# Patient Record
Sex: Male | Born: 1940 | Race: White | Hispanic: No | Marital: Married | Smoking: Never smoker
Health system: Southern US, Community
[De-identification: ages and names within clinical notes are randomized; demographics above are authoritative.]

## PROBLEM LIST (undated history)

## (undated) DIAGNOSIS — I10 Essential (primary) hypertension: Secondary | ICD-10-CM

## (undated) DIAGNOSIS — I499 Cardiac arrhythmia, unspecified: Secondary | ICD-10-CM

## (undated) DIAGNOSIS — Z974 Presence of external hearing-aid: Secondary | ICD-10-CM

## (undated) DIAGNOSIS — Z972 Presence of dental prosthetic device (complete) (partial): Secondary | ICD-10-CM

## (undated) DIAGNOSIS — N400 Enlarged prostate without lower urinary tract symptoms: Secondary | ICD-10-CM

## (undated) DIAGNOSIS — I4891 Unspecified atrial fibrillation: Secondary | ICD-10-CM

## (undated) DIAGNOSIS — I809 Phlebitis and thrombophlebitis of unspecified site: Secondary | ICD-10-CM

## (undated) DIAGNOSIS — R351 Nocturia: Secondary | ICD-10-CM

## (undated) DIAGNOSIS — N189 Chronic kidney disease, unspecified: Secondary | ICD-10-CM

## (undated) DIAGNOSIS — I252 Old myocardial infarction: Secondary | ICD-10-CM

## (undated) DIAGNOSIS — M109 Gout, unspecified: Secondary | ICD-10-CM

## (undated) DIAGNOSIS — C801 Malignant (primary) neoplasm, unspecified: Secondary | ICD-10-CM

## (undated) DIAGNOSIS — E785 Hyperlipidemia, unspecified: Secondary | ICD-10-CM

## (undated) DIAGNOSIS — I2510 Atherosclerotic heart disease of native coronary artery without angina pectoris: Secondary | ICD-10-CM

## (undated) DIAGNOSIS — M1990 Unspecified osteoarthritis, unspecified site: Secondary | ICD-10-CM

## (undated) HISTORY — PX: HERNIA REPAIR: SHX51

## (undated) HISTORY — DX: Benign prostatic hyperplasia without lower urinary tract symptoms: N40.0

## (undated) HISTORY — PX: CARDIAC CATHETERIZATION: SHX172

## (undated) HISTORY — PX: OTHER SURGICAL HISTORY: SHX169

## (undated) HISTORY — DX: Nocturia: R35.1

---

## 1990-12-26 HISTORY — PX: CORONARY ARTERY BYPASS GRAFT: SHX141

## 2004-05-22 ENCOUNTER — Other Ambulatory Visit: Payer: Self-pay

## 2004-11-14 ENCOUNTER — Emergency Department: Payer: Self-pay | Admitting: Emergency Medicine

## 2004-12-19 ENCOUNTER — Other Ambulatory Visit: Payer: Self-pay

## 2004-12-20 ENCOUNTER — Other Ambulatory Visit: Payer: Self-pay

## 2004-12-20 ENCOUNTER — Inpatient Hospital Stay: Payer: Self-pay | Admitting: Internal Medicine

## 2004-12-22 ENCOUNTER — Other Ambulatory Visit: Payer: Self-pay

## 2005-01-11 ENCOUNTER — Encounter: Payer: Self-pay | Admitting: Internal Medicine

## 2005-01-26 ENCOUNTER — Encounter: Payer: Self-pay | Admitting: Internal Medicine

## 2005-03-16 ENCOUNTER — Ambulatory Visit: Payer: Self-pay | Admitting: Otolaryngology

## 2007-04-26 ENCOUNTER — Ambulatory Visit: Payer: Self-pay | Admitting: Radiation Oncology

## 2007-05-01 ENCOUNTER — Ambulatory Visit: Payer: Self-pay | Admitting: Radiation Oncology

## 2007-05-27 ENCOUNTER — Ambulatory Visit: Payer: Self-pay | Admitting: Radiation Oncology

## 2007-06-26 ENCOUNTER — Ambulatory Visit: Payer: Self-pay | Admitting: Radiation Oncology

## 2007-08-06 ENCOUNTER — Ambulatory Visit: Payer: Self-pay | Admitting: Gastroenterology

## 2007-11-21 ENCOUNTER — Emergency Department: Payer: Self-pay | Admitting: Emergency Medicine

## 2008-01-01 DIAGNOSIS — C4492 Squamous cell carcinoma of skin, unspecified: Secondary | ICD-10-CM

## 2008-01-01 HISTORY — DX: Squamous cell carcinoma of skin, unspecified: C44.92

## 2008-01-10 DIAGNOSIS — Z86006 Personal history of melanoma in-situ: Secondary | ICD-10-CM

## 2008-01-10 HISTORY — DX: Personal history of melanoma in-situ: Z86.006

## 2008-02-21 ENCOUNTER — Other Ambulatory Visit: Payer: Self-pay

## 2008-02-21 ENCOUNTER — Inpatient Hospital Stay: Payer: Self-pay | Admitting: Internal Medicine

## 2008-08-18 ENCOUNTER — Ambulatory Visit: Payer: Self-pay | Admitting: Gastroenterology

## 2009-03-14 ENCOUNTER — Emergency Department: Payer: Self-pay | Admitting: Unknown Physician Specialty

## 2009-03-14 ENCOUNTER — Emergency Department: Payer: Self-pay | Admitting: Emergency Medicine

## 2010-05-07 ENCOUNTER — Emergency Department: Payer: Self-pay | Admitting: Internal Medicine

## 2010-10-06 ENCOUNTER — Inpatient Hospital Stay: Payer: Self-pay | Admitting: Internal Medicine

## 2011-03-24 ENCOUNTER — Inpatient Hospital Stay: Payer: Self-pay | Admitting: Internal Medicine

## 2011-06-17 ENCOUNTER — Ambulatory Visit: Payer: Self-pay | Admitting: Specialist

## 2011-06-24 ENCOUNTER — Ambulatory Visit: Payer: Self-pay | Admitting: Specialist

## 2011-10-05 ENCOUNTER — Encounter: Payer: Self-pay | Admitting: Specialist

## 2011-10-27 ENCOUNTER — Encounter: Payer: Self-pay | Admitting: Specialist

## 2011-11-26 ENCOUNTER — Encounter: Payer: Self-pay | Admitting: Specialist

## 2012-02-03 ENCOUNTER — Ambulatory Visit: Payer: Self-pay | Admitting: Gastroenterology

## 2012-02-03 LAB — PROTIME-INR: Prothrombin Time: 14.5 secs (ref 11.5–14.7)

## 2012-02-07 LAB — PATHOLOGY REPORT

## 2012-06-01 ENCOUNTER — Ambulatory Visit: Payer: Self-pay | Admitting: Gastroenterology

## 2012-06-01 LAB — PROTIME-INR
INR: 1.1
Prothrombin Time: 14.1 secs (ref 11.5–14.7)

## 2012-06-05 LAB — PATHOLOGY REPORT

## 2013-07-04 ENCOUNTER — Ambulatory Visit: Payer: Self-pay | Admitting: Specialist

## 2013-07-04 LAB — CBC WITH DIFFERENTIAL/PLATELET
Basophil %: 0.5 %
HCT: 37.2 % — ABNORMAL LOW (ref 40.0–52.0)
HGB: 13.3 g/dL (ref 13.0–18.0)
Lymphocyte #: 0.6 10*3/uL — ABNORMAL LOW (ref 1.0–3.6)
Lymphocyte %: 15 %
MCH: 33.4 pg (ref 26.0–34.0)
Monocyte #: 0.4 x10 3/mm (ref 0.2–1.0)
Monocyte %: 10.9 %
Neutrophil #: 2.9 10*3/uL (ref 1.4–6.5)
Neutrophil %: 71.8 %
RBC: 3.97 10*6/uL — ABNORMAL LOW (ref 4.40–5.90)
RDW: 13.1 % (ref 11.5–14.5)
WBC: 4 10*3/uL (ref 3.8–10.6)

## 2013-07-04 LAB — BASIC METABOLIC PANEL
Anion Gap: 5 — ABNORMAL LOW (ref 7–16)
BUN: 22 mg/dL — ABNORMAL HIGH (ref 7–18)
Calcium, Total: 8.6 mg/dL (ref 8.5–10.1)
Co2: 29 mmol/L (ref 21–32)
Creatinine: 1.95 mg/dL — ABNORMAL HIGH (ref 0.60–1.30)
EGFR (African American): 38 — ABNORMAL LOW
EGFR (Non-African Amer.): 33 — ABNORMAL LOW
Potassium: 3.3 mmol/L — ABNORMAL LOW (ref 3.5–5.1)

## 2013-07-11 ENCOUNTER — Ambulatory Visit: Payer: Self-pay | Admitting: Specialist

## 2013-07-11 LAB — PROTIME-INR
INR: 1.2
Prothrombin Time: 15.1 secs — ABNORMAL HIGH (ref 11.5–14.7)

## 2013-09-23 ENCOUNTER — Ambulatory Visit: Payer: Self-pay | Admitting: Surgery

## 2013-09-23 LAB — BASIC METABOLIC PANEL
Calcium, Total: 9.1 mg/dL (ref 8.5–10.1)
Chloride: 99 mmol/L (ref 98–107)
Creatinine: 1.58 mg/dL — ABNORMAL HIGH (ref 0.60–1.30)
EGFR (African American): 50 — ABNORMAL LOW
EGFR (Non-African Amer.): 43 — ABNORMAL LOW
Osmolality: 271 (ref 275–301)
Potassium: 3.7 mmol/L (ref 3.5–5.1)
Sodium: 133 mmol/L — ABNORMAL LOW (ref 136–145)

## 2013-09-26 ENCOUNTER — Ambulatory Visit: Payer: Self-pay | Admitting: Surgery

## 2013-10-01 ENCOUNTER — Ambulatory Visit: Payer: Self-pay | Admitting: Surgery

## 2013-10-01 ENCOUNTER — Emergency Department: Payer: Self-pay | Admitting: Emergency Medicine

## 2013-10-01 LAB — PROTIME-INR
INR: 1.3
Prothrombin Time: 15.8 secs — ABNORMAL HIGH (ref 11.5–14.7)

## 2013-10-02 LAB — BASIC METABOLIC PANEL
Anion Gap: 7 (ref 7–16)
BUN: 16 mg/dL (ref 7–18)
Calcium, Total: 8.8 mg/dL (ref 8.5–10.1)
Chloride: 99 mmol/L (ref 98–107)
EGFR (African American): 53 — ABNORMAL LOW
Glucose: 119 mg/dL — ABNORMAL HIGH (ref 65–99)
Potassium: 3.9 mmol/L (ref 3.5–5.1)
Sodium: 134 mmol/L — ABNORMAL LOW (ref 136–145)

## 2013-10-02 LAB — PATHOLOGY REPORT

## 2013-10-02 LAB — URINALYSIS, COMPLETE
Glucose,UR: 50 mg/dL (ref 0–75)
Leukocyte Esterase: NEGATIVE
Nitrite: NEGATIVE
RBC,UR: 4 /HPF (ref 0–5)
Specific Gravity: 1.023 (ref 1.003–1.030)
WBC UR: 2 /HPF (ref 0–5)

## 2013-10-02 LAB — CBC
HCT: 38.2 % — ABNORMAL LOW (ref 40.0–52.0)
MCH: 34.1 pg — ABNORMAL HIGH (ref 26.0–34.0)
MCV: 96 fL (ref 80–100)
Platelet: 186 10*3/uL (ref 150–440)
RBC: 3.99 10*6/uL — ABNORMAL LOW (ref 4.40–5.90)
RDW: 13.3 % (ref 11.5–14.5)

## 2013-10-02 LAB — APTT: Activated PTT: 24.9 secs (ref 23.6–35.9)

## 2013-10-02 LAB — PROTIME-INR
INR: 1.3
Prothrombin Time: 16.3 secs — ABNORMAL HIGH (ref 11.5–14.7)

## 2013-10-05 ENCOUNTER — Emergency Department: Payer: Self-pay | Admitting: Emergency Medicine

## 2013-10-05 LAB — URINALYSIS, COMPLETE
Bilirubin,UR: NEGATIVE
Glucose,UR: NEGATIVE mg/dL (ref 0–75)
Ketone: NEGATIVE
Ph: 6 (ref 4.5–8.0)
Protein: 100
Specific Gravity: 1.024 (ref 1.003–1.030)
WBC UR: 179 /HPF (ref 0–5)

## 2013-10-05 LAB — CBC
HCT: 36.6 % — ABNORMAL LOW (ref 40.0–52.0)
HGB: 12.9 g/dL — ABNORMAL LOW (ref 13.0–18.0)
MCHC: 35.3 g/dL (ref 32.0–36.0)
MCV: 96 fL (ref 80–100)
Platelet: 201 10*3/uL (ref 150–440)
RBC: 3.82 10*6/uL — ABNORMAL LOW (ref 4.40–5.90)
WBC: 10.7 10*3/uL — ABNORMAL HIGH (ref 3.8–10.6)

## 2013-10-05 LAB — COMPREHENSIVE METABOLIC PANEL
Albumin: 3.5 g/dL (ref 3.4–5.0)
Alkaline Phosphatase: 76 U/L (ref 50–136)
Anion Gap: 5 — ABNORMAL LOW (ref 7–16)
BUN: 17 mg/dL (ref 7–18)
Bilirubin,Total: 1 mg/dL (ref 0.2–1.0)
Calcium, Total: 9.1 mg/dL (ref 8.5–10.1)
Chloride: 93 mmol/L — ABNORMAL LOW (ref 98–107)
Co2: 31 mmol/L (ref 21–32)
Glucose: 115 mg/dL — ABNORMAL HIGH (ref 65–99)
SGOT(AST): 68 U/L — ABNORMAL HIGH (ref 15–37)
SGPT (ALT): 72 U/L (ref 12–78)
Sodium: 129 mmol/L — ABNORMAL LOW (ref 136–145)

## 2013-10-09 LAB — URINE CULTURE

## 2013-11-05 DIAGNOSIS — N138 Other obstructive and reflux uropathy: Secondary | ICD-10-CM | POA: Insufficient documentation

## 2014-04-11 ENCOUNTER — Observation Stay: Payer: Self-pay | Admitting: Family Medicine

## 2014-04-11 LAB — COMPREHENSIVE METABOLIC PANEL
ALK PHOS: 54 U/L
ANION GAP: 5 — AB (ref 7–16)
AST: 31 U/L (ref 15–37)
Albumin: 3.9 g/dL (ref 3.4–5.0)
BILIRUBIN TOTAL: 0.6 mg/dL (ref 0.2–1.0)
BUN: 25 mg/dL — ABNORMAL HIGH (ref 7–18)
CALCIUM: 8.5 mg/dL (ref 8.5–10.1)
CHLORIDE: 96 mmol/L — AB (ref 98–107)
CREATININE: 1.93 mg/dL — AB (ref 0.60–1.30)
Co2: 29 mmol/L (ref 21–32)
GFR CALC AF AMER: 39 — AB
GFR CALC NON AF AMER: 33 — AB
Glucose: 128 mg/dL — ABNORMAL HIGH (ref 65–99)
OSMOLALITY: 267 (ref 275–301)
POTASSIUM: 3.6 mmol/L (ref 3.5–5.1)
SGPT (ALT): 48 U/L (ref 12–78)
Sodium: 130 mmol/L — ABNORMAL LOW (ref 136–145)
Total Protein: 7.1 g/dL (ref 6.4–8.2)

## 2014-04-11 LAB — APTT: Activated PTT: 28.4 secs (ref 23.6–35.9)

## 2014-04-11 LAB — CBC
HCT: 38.3 % — ABNORMAL LOW (ref 40.0–52.0)
HGB: 13.1 g/dL (ref 13.0–18.0)
MCH: 31.7 pg (ref 26.0–34.0)
MCHC: 34.2 g/dL (ref 32.0–36.0)
MCV: 93 fL (ref 80–100)
Platelet: 195 10*3/uL (ref 150–440)
RBC: 4.13 10*6/uL — ABNORMAL LOW (ref 4.40–5.90)
RDW: 13.1 % (ref 11.5–14.5)
WBC: 3.6 10*3/uL — AB (ref 3.8–10.6)

## 2014-04-11 LAB — PROTIME-INR
INR: 2.3
PROTHROMBIN TIME: 25.1 s — AB (ref 11.5–14.7)

## 2014-04-11 LAB — TROPONIN I
Troponin-I: <0.02 ng/mL
Troponin-I: <0.02 ng/mL

## 2014-04-11 LAB — CK TOTAL AND CKMB (NOT AT ARMC)
CK, TOTAL: 168 U/L
CK-MB: 2.2 ng/mL (ref 0.5–3.6)

## 2014-04-12 LAB — BASIC METABOLIC PANEL
ANION GAP: 6 — AB (ref 7–16)
BUN: 23 mg/dL — AB (ref 7–18)
CHLORIDE: 97 mmol/L — AB (ref 98–107)
CREATININE: 1.68 mg/dL — AB (ref 0.60–1.30)
Calcium, Total: 8.7 mg/dL (ref 8.5–10.1)
Co2: 29 mmol/L (ref 21–32)
EGFR (Non-African Amer.): 39 — ABNORMAL LOW
GFR CALC AF AMER: 46 — AB
GLUCOSE: 92 mg/dL (ref 65–99)
OSMOLALITY: 268 (ref 275–301)
Potassium: 3.9 mmol/L (ref 3.5–5.1)
Sodium: 132 mmol/L — ABNORMAL LOW (ref 136–145)

## 2014-04-12 LAB — TROPONIN I: Troponin-I: <0.02 ng/mL

## 2014-04-28 DIAGNOSIS — Z7901 Long term (current) use of anticoagulants: Secondary | ICD-10-CM | POA: Insufficient documentation

## 2014-04-28 DIAGNOSIS — Z9229 Personal history of other drug therapy: Secondary | ICD-10-CM | POA: Insufficient documentation

## 2014-05-13 DIAGNOSIS — N189 Chronic kidney disease, unspecified: Secondary | ICD-10-CM | POA: Insufficient documentation

## 2014-05-13 DIAGNOSIS — N1831 Chronic kidney disease, stage 3a: Secondary | ICD-10-CM | POA: Insufficient documentation

## 2014-05-13 DIAGNOSIS — I4891 Unspecified atrial fibrillation: Secondary | ICD-10-CM | POA: Insufficient documentation

## 2014-05-13 DIAGNOSIS — I2510 Atherosclerotic heart disease of native coronary artery without angina pectoris: Secondary | ICD-10-CM | POA: Insufficient documentation

## 2014-05-13 DIAGNOSIS — I10 Essential (primary) hypertension: Secondary | ICD-10-CM | POA: Insufficient documentation

## 2014-05-13 DIAGNOSIS — R079 Chest pain, unspecified: Secondary | ICD-10-CM | POA: Insufficient documentation

## 2014-05-13 DIAGNOSIS — N183 Chronic kidney disease, stage 3 unspecified: Secondary | ICD-10-CM | POA: Insufficient documentation

## 2014-05-13 DIAGNOSIS — E782 Mixed hyperlipidemia: Secondary | ICD-10-CM | POA: Insufficient documentation

## 2014-05-13 DIAGNOSIS — E785 Hyperlipidemia, unspecified: Secondary | ICD-10-CM | POA: Insufficient documentation

## 2014-05-13 DIAGNOSIS — I25810 Atherosclerosis of coronary artery bypass graft(s) without angina pectoris: Secondary | ICD-10-CM | POA: Insufficient documentation

## 2014-05-13 DIAGNOSIS — Z9889 Other specified postprocedural states: Secondary | ICD-10-CM | POA: Insufficient documentation

## 2014-10-11 IMAGING — CR DG CHEST 2V
1 series · 3 of 3 positions shown · non-contrast
Comparison: none

REASON FOR EXAM: htn
COMMENTS:

[Series 1: pa · 0.17mm/px · 3 of 3 slices shown]
[im 1/3]
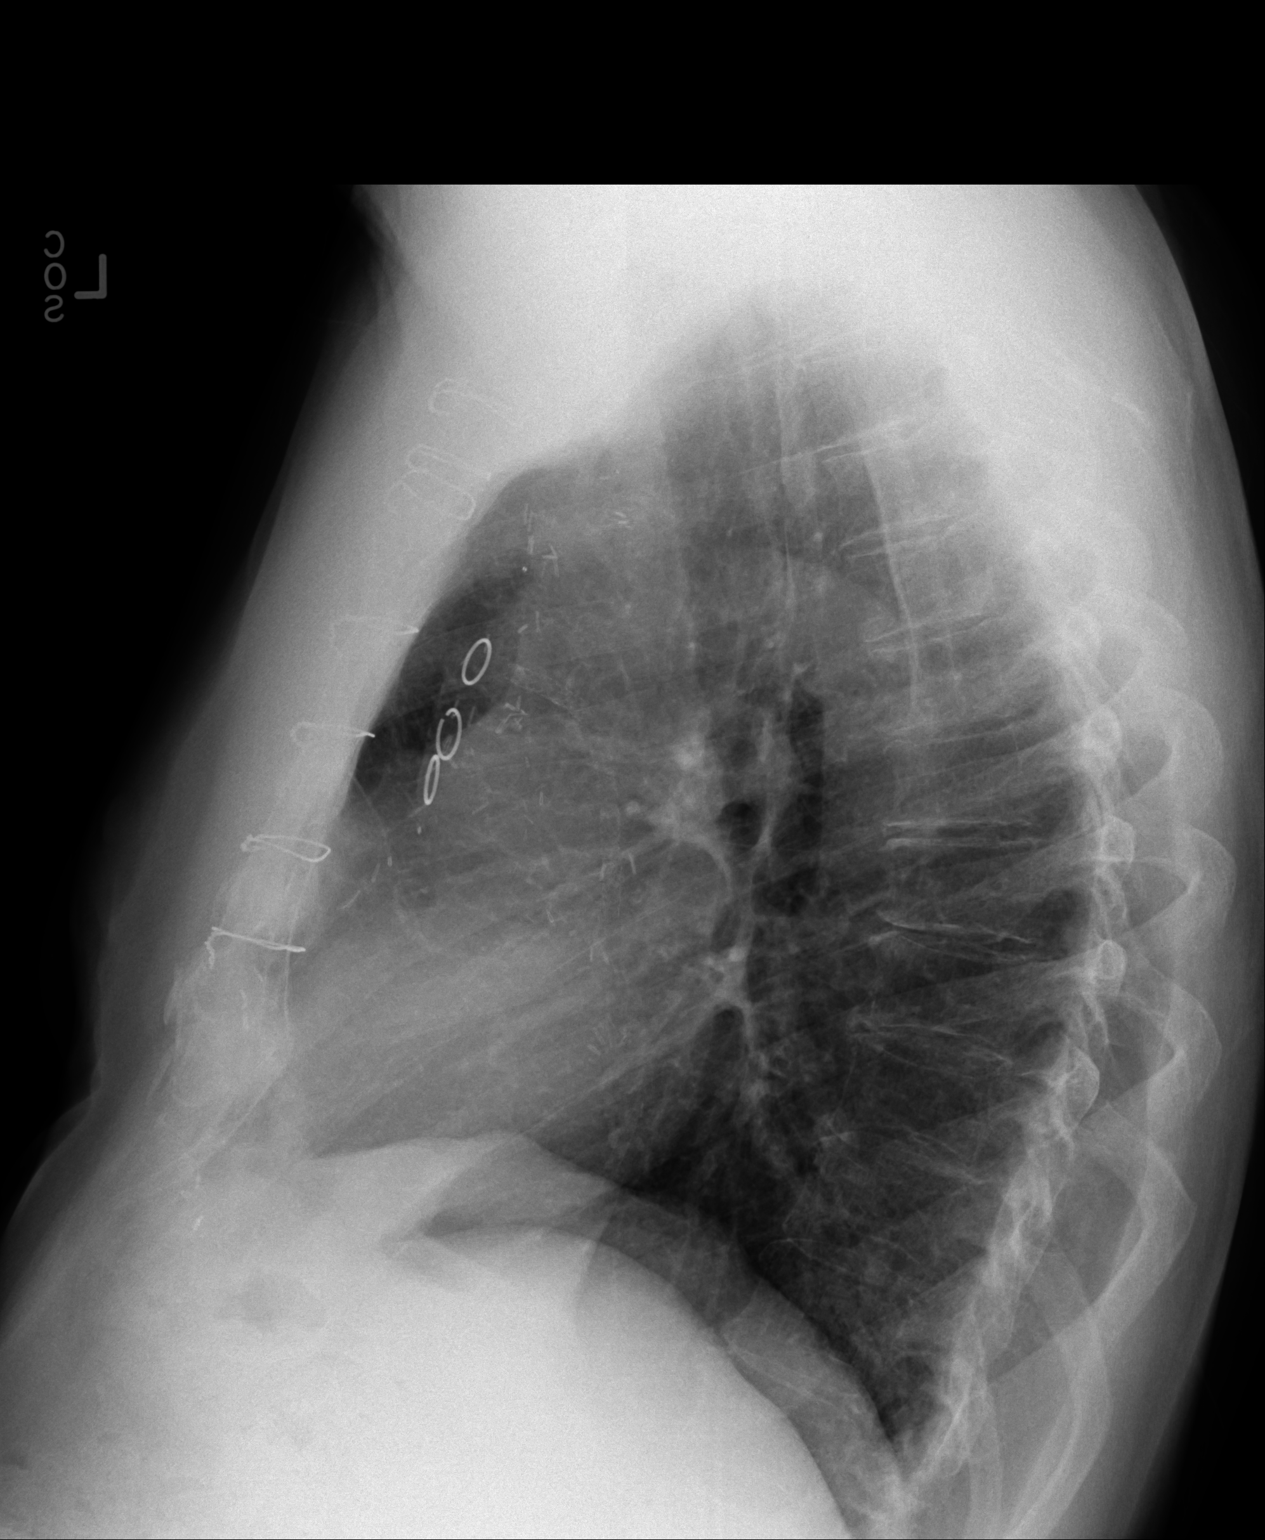
[im 2/3]
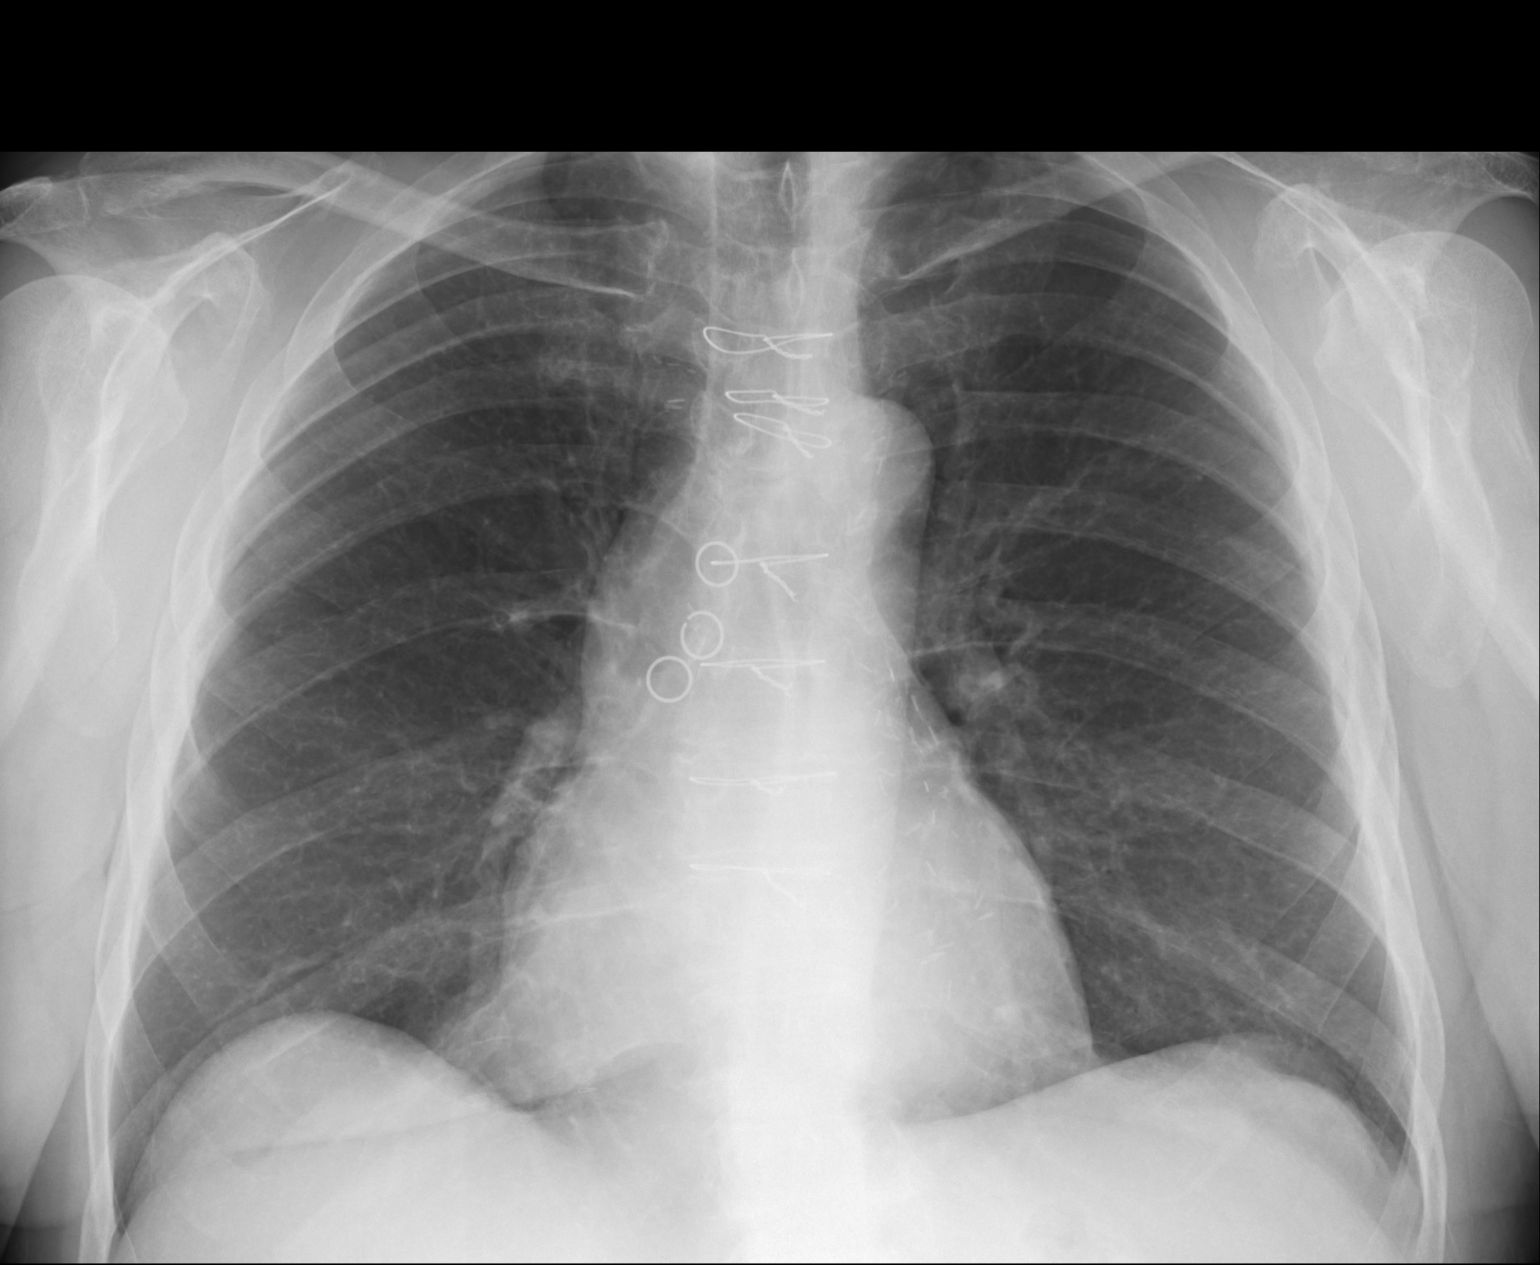
[im 3/3]
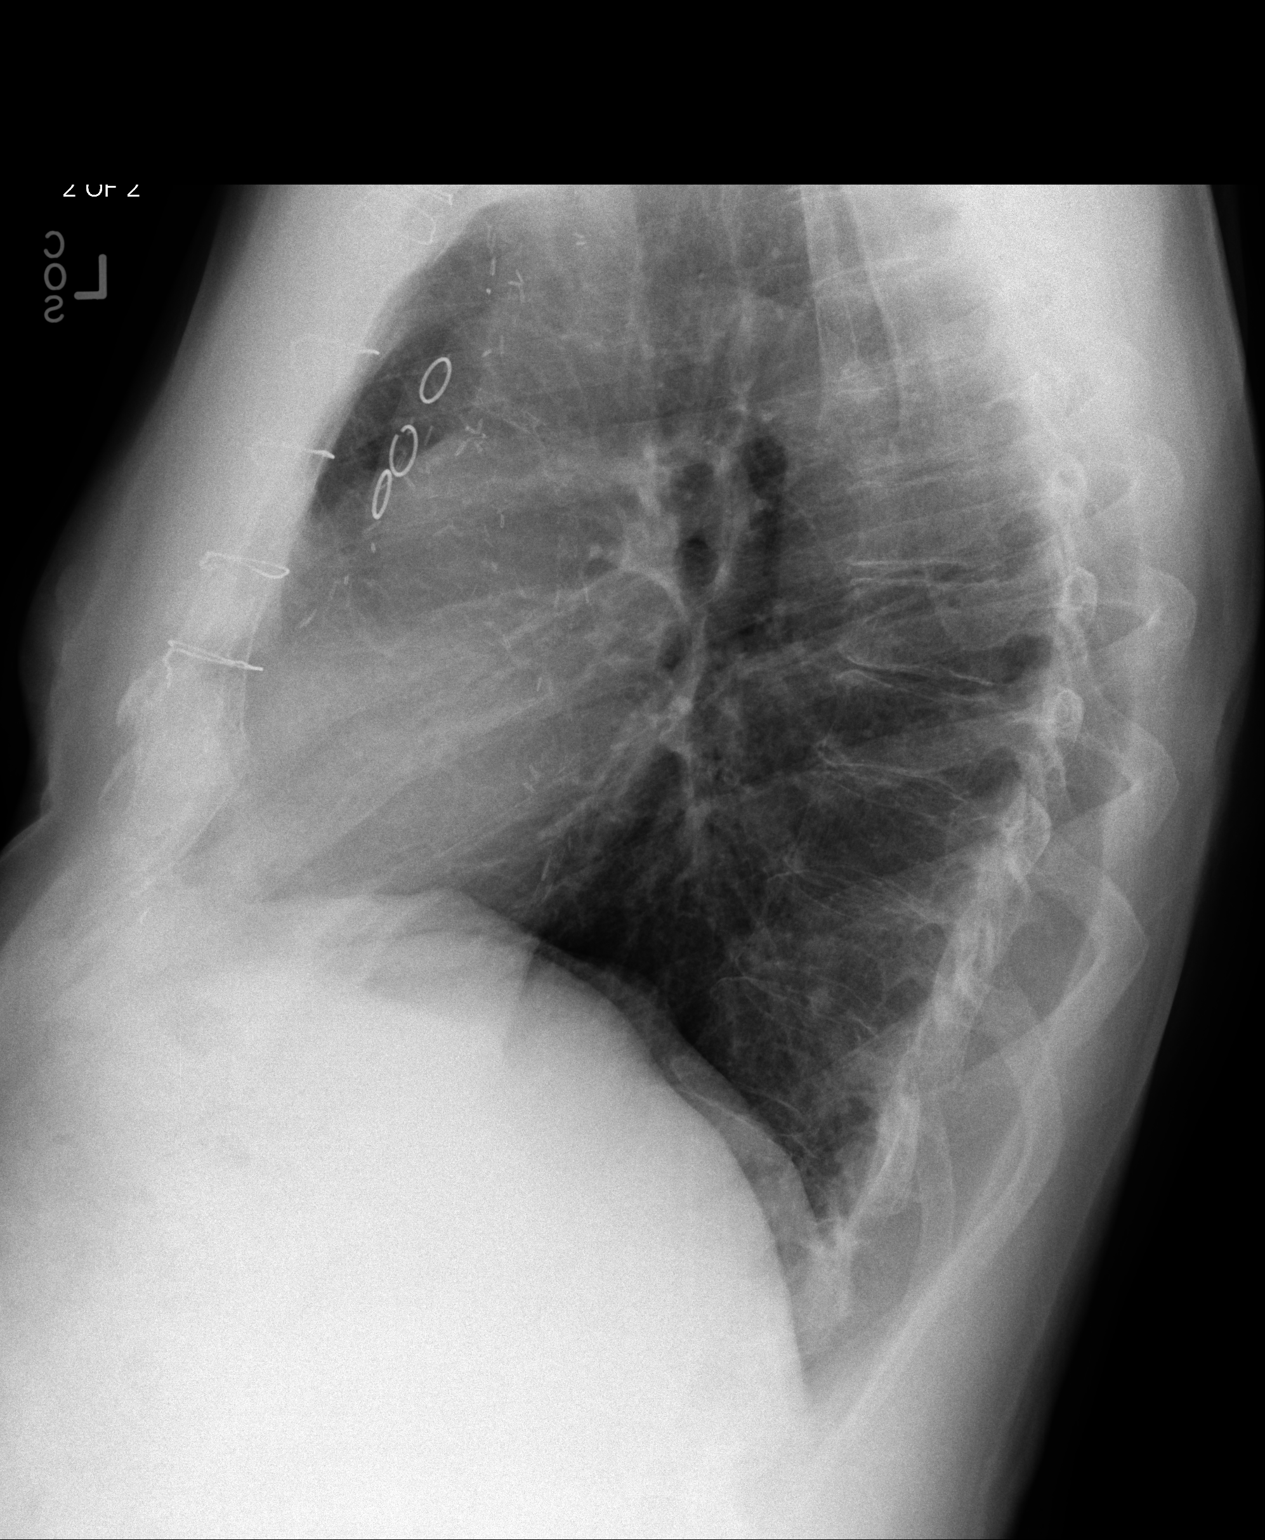

[3 of 3 positions shown; findings below may reference images not displayed]

PROCEDURE:     DXR - DXR CHEST PA (OR AP) AND LATERAL  - September 26, 2013  [DATE]

RESULT:     Comparison is made to the study March 24, 2011.

The lungs are well-expanded and clear. The cardiac silhouette is normal in
size. The patient's undergone previous CABG. The pulmonary vascularity is
not engorged. There is no pleural effusion or pneumothorax. The observed
portions of the bony thorax exhibit no acute abnormalities.
IMPRESSION: There is no evidence of acute cardiopulmonary abnormality.

[REDACTED]

## 2015-01-20 DIAGNOSIS — I480 Paroxysmal atrial fibrillation: Secondary | ICD-10-CM | POA: Insufficient documentation

## 2015-02-12 ENCOUNTER — Ambulatory Visit: Payer: Self-pay | Admitting: Internal Medicine

## 2015-02-23 ENCOUNTER — Emergency Department: Payer: Self-pay | Admitting: Emergency Medicine

## 2015-04-17 NOTE — Op Note (Signed)
PATIENT NAME:  Robert Rivas, Robert Rivas MR#:  [id number] DATE OF BIRTH:  03/02/1941  DATE OF PROCEDURE:  10/01/2013  PREOPERATIVE DIAGNOSIS: Left inguinal hernia.  POSTOPERATIVE DIAGNOSIS:  Left inguinal hernia.  PROCEDURE  PERFORMED: Laparoscopic total extraperitoneal  hernia repair with mesh, left  ESTIMATED BLOOD LOSS: 20 mL.   COMPLICATIONS: Inadvertent ligation of the left vas deferens.   SPECIMEN: Devascularized portion of the left vas deferens.  SURGEON: Jakeob Tullis A. Iveliz Garay, M.D.   ANESTHESIA: General.   INDICATION FOR SURGERY: [name] is a pleasant 74 year old male with history of a left groin bulge seen previously in clinic.  It became symptomatic I thus brought to the operating room for laparoscopic hernia repair.   DETAILS OF PROCEDURE: Informed consent was obtained. [name] was brought to the operating room suite. He was induced. Endotracheal tube was placed. General anesthesia was administered. His abdomen was then prepped and draped in standard surgical fashion. A timeout was then performed correctly identifying the patient name, operative site and procedure to be performed. An infraumbilical left-sided incision was made down to the rectus fascia. The anterior rectus sheath was incised. The rectus muscle was retracted laterally, an S retractor was placed through the defect and under the rectus muscle. Bluntly I created a tack to the pubic symphysis. I then placed the insufflation balloon behind the rectus and insufflated it under direct visualization. The balloon was then removed and insufflation trocar was then placed through posterior to the rectus through the same incision and insufflated. The two 5 mm trocars were then  placed under direct visualization at the midline approximately 2 to 3 cm below the previous incision. Then examined the left groin. There appeared to be a significant indirect hernia with no obvious direct hernia. The sac was then taken down. It was quite  stuck and it required a bit of force to pull it back. Upon dissection. I did notice a structure, which was reduced, which I found later to be the vas deferens which was inadvertently either avulsed or divided with blunt dissection, I thus, called for my colleague to evaluate this as well and see if he agreed with this. Dr. Marina Gravel did agree with this assessment and due to the patient's age, decision was made to clip the vas deferens which appeared to have a small area where it looked somewhat dusky due to traction and a small piece sent to pathology. After the sac was completely withdrawn and the hernia was reduced, I did place a piece of Atrium mesh 4 x 6 cm over defect. I did not create a keyhole because I did not want to risk devascularizing the testicle. The mesh was fixed to Cooper's ligament using two tacks and the anterior abdominal wall using 3 tacks. I looked at the wound and it was noted to be hemostatic. Then under direct visualization I slowly desufflated the pneumoperitoneum and watched to ensure the mesh did not fold. All trocars were then taken out. I then closed the anterior rectus sheath with a figure-of-eight 0 Vicryl suture, and then closed all skin sites using interrupted 4-0 Monocryl deep dermal sutures. Steri-Strips, Telfa gauze and Tegaderm were then used to complete the dressing. [name] was then awoken, extubated and brought to the postanesthesia care unit. Needle, sponge, and instrument count was correct at the end of the procedure.    ____________________________ Glena Norfolk. Oliviana Mcgahee, MD cal:sg D: 10/02/2013 12:51:03 ET T: 10/02/2013 13:07:20 ET JOB#: [id number]  cc: Harrell Gave A. Jessicah Croll, MD, <Dictator> Ellin Fitzgibbons A  Devota Viruet MD ELECTRONICALLY SIGNED 10/07/2013 21:11

## 2015-04-17 NOTE — Op Note (Signed)
PATIENT NAME:  Robert Rivas, Robert Rivas MR#:  [id number] DATE OF BIRTH:  01-04-1941  DATE OF PROCEDURE:  07/11/2013  PREOPERATIVE DIAGNOSIS: Recurrent contracture, left long finger, from Dupuytren's disease.   POSTOPERATIVE DIAGNOSIS: Recurrent contracture, left long finger, from Dupuytren's disease.   PROCEDURE: Excision of Dupuytren's fascia from the left palm and finger with full-thickness skin grafting from hypothenar eminence.   SURGEON: Lucas Mallow, MD   ANESTHESIA: General.   COMPLICATIONS: None.   TOURNIQUET TIME: 120 minutes.   DESCRIPTION OF PROCEDURE: Ancef 1 gram was given intravenously prior to the procedure. General anesthesia is induced. The left upper extremity is thoroughly prepped with alcohol and ChloraPrep and draped in standard sterile fashion. The extremity is wrapped out with the Esmarch bandage and pneumatic tourniquet elevated to 250 mmHg. Under loupe magnification, a standard zigzag incision is made beginning proximally at the cord involving the left long finger. The dissection is carried down first proximally, and the skin flaps are tagged. The neurovascular bundle is identified on each side, and going slowly and carefully, each neurovascular bundle is dissected out to the level of the mid middle phalanx area. The central area of recurrent Dupuytren's disease is then excised. There still is a considerable contracture at the PIP joint, and therefore the flexor tendons are reflected to the side, and a volar release of the volar PIP joint is performed, and manually, the PIP joint could be fully extended with the wrist flexed. The wound is thoroughly irrigated multiple times. Proximal and distal incisions are closed where the skin is flexible and easily closed. This left a defect of 2 cm x 1 cm overlying the PIP joint area. Full thickness skin graft was then taken from the hypothenar eminence and that wound closed. The full thickness skin graft is then sewn down over a bolus  graft and secured with the nylon sutures. Soft bulky dressing is then applied with a volar fiberglass splint, keeping the finger in as much extension as possible. After tourniquet release, capillary refill did return to the tip of the long finger. The patient is returned to the recovery room in satisfactory condition, having tolerated the procedure quite well.   ____________________________ Lucas Mallow, MD ces:OSi D: 07/12/2013 08:20:45 ET T: 07/12/2013 08:46:02 ET JOB#: [id number]  cc: Lucas Mallow, MD, <Dictator> Lucas Mallow MD ELECTRONICALLY SIGNED 07/13/2013 15:24

## 2015-04-18 NOTE — Consult Note (Signed)
PATIENT NAME:  Robert Rivas, Robert Rivas MR#:  [id number] DATE OF BIRTH:  22-Jul-1941  DATE OF CONSULTATION:  04/12/2014  REFERRING PHYSICIAN:  Dr. Doy Hutching CONSULTING PHYSICIAN:  Nomi Rudnicki D. Clayborn Bigness, MD  PRIMARY CARDIOLOGIST: Dr. Nehemiah Massed  REFERRING PHYSICIAN: Dr. Remigio Eisenmenger from [facility]  INDICATION: Chest pain, possible angina.   HISTORY OF PRESENT ILLNESS: The patient is a 74 year old white male with history of coronary artery disease, known coronary bypass surgery, multiple PCI, hypertension. He presented to the Emergency Room with on-and-off chest pain over the last several weeks. It has gotten progressively worse. The last episode was two days ago. Resolved with nitroglycerin. He called Dr. Nehemiah Massed. He was set up for outpatient follow-up in the office on 04/20, but today the patient was ready to go the gym and started having substernal chest pain, felt extremely lightheaded. He states he almost passed because of his pain. He took nitroglycerin pills. The pain completely resolved with the third pill. He denied having any shortness of breath, nausea, vomiting, or diaphoresis. He came to the Emergency Room. EKG shows nonspecific findings. Troponins was less than 0.2. He was admitted for evaluation, with improved symptoms after the nitroglycerin. Last stress test was December 2014, and reportedly was okay, as per the patient. Last catheterization was 2005.   PAST MEDICAL HISTORY: Coronary artery disease, hyperlipidemia, hypertension, gout, epistaxis, skin cancer, supraventricular tachycardia, atrial fib/flutter.   PAST SURGICAL HISTORY: Cardiac bypass surgery, stent placement on multiple occasions. The last one was 2011.   SOCIAL HISTORY: Lives with his wife. No smoking or alcohol consumption. Retired from [employer].   FAMILY HISTORY: Coronary artery disease, diabetes, CVA.   ALLERGIES: PERCOCET, CODEINE.   MEDICATIONS: Toprol-XL 100, Vytorin 1 tablet a day, Benicar HCTZ 10/12.5, probenecid 500 twice a day,  nabumetone 500 twice a day, Prilosec 20 twice a day, aspirin 325 a day, Ultram 50 q.6 p.r.n., nitroglycerin p.r.n., Coumadin for A. fib; he is not sure of the dose.   REVIEW OF SYSTEMS: No blackout spells or syncope. No nausea or vomiting. No fever, no chills, no sweats. No weight loss, no weight gain. No hemoptysis, hematemesis. No bright red blood per rectum. No vision change or hearing change. No sputum production or cough. He had anginal symptoms on and off at rest and with exertion.   PHYSICAL EXAMINATION: VITAL SIGNS: Blood pressure was 120/60, pulse 60, respiratory rate 14, afebrile.  HEENT: Normocephalic, atraumatic. Pupils equal and reactive to light.  NECK: Supple. No significant JVD, bruits or adenopathy.  LUNGS: Clear to auscultation and percussion. No significant wheeze, rhonchi, or rales.  HEART: At this point, regular rate and rhythm. Systolic ejection murmur at the apex. PMI nondisplaced.  ABDOMEN: Benign.  EXTREMITY: Within normal limits.  NEUROLOGIC: Intact.  SKIN: Normal.   LABORATORIES: Glucose 128, BUN 25, creatinine 1.93, sodium 130, potassium 3.6. AST, ALT within normal limits. Troponin less than 0.2. White count 3.6, hemoglobin 13, platelet count 195, INR 2.3. EKG: Sinus rhythm, nonspecific ST-T wave changes. Chest x-ray negative.   ASSESSMENT: Possible unstable angina, known coronary artery disease, chronic renal insufficiency, hyperlipidemia and atrial fibrillation.   PLAN:  1.  Agree with admit. Rule out for myocardial infarction. Follow up cardiac enzymes. Follow up EKGs. Consider adding Imdur to help with anginal symptoms. I am also concerned with recent stress test. He may need further evaluation, possibly with a cardiac catheterization, with accelerating anginal symptoms and known coronary artery disease. Again, we will start Imdur for now,  have the patient consider whether  cardiac catheterization as an inpatient or an outpatient is recommended.  2.  Continue  aspirin therapy for anticoagulation.  3.  For atrial fibrillation. Continue Coumadin therapy for anticoagulation. Rate appears to be well-controlled in sinus. INR appears to be adequately controlled. I do not recommend further evaluation for the atrial fibrillation as long as INR remains therapeutic.  4.  For chronic renal insufficiency,  , consider nephrology input. Refrain from nephrotoxic drugs. We will consider switching from ACE inhibitor which is benazepril and HCTZ, to something else that may control his blood pressure adequately with affecting renal function.  5.  For hyperlipidemia, he is on Vytorin. Follow up lipid studies with his primary. We will continue current therapy.  6.  Hypertension. Again, he is on benazepril. Would consider switching to alternate medication that may be less nephrotoxic.  7.  Gout appears to be relatively controlled. We will continue current therapy.   Again, institute Imdur therapy to see if medical therapy is all he needs for anginal symptoms and consider whether invasive evaluation is indicated, an inpatient or outpatient.   ____________________________ Loran Senters. Clayborn Bigness, MD ddc:cg D: 04/14/2014 04:27:49 ET T: 04/14/2014 07:23:23 ET JOB#: [id number]  cc: [place] Masaki D. Clayborn Bigness, MD, <Dictator> Yolonda Kida MD ELECTRONICALLY SIGNED 05/13/2014 12:42

## 2015-04-18 NOTE — H&P (Signed)
PATIENT NAME:  Robert Rivas, Robert Rivas MR#:  [id number] DATE OF BIRTH:  01-06-1941  DATE OF ADMISSION:  04/11/2014  PRIMARY CARE PROVIDER: Leonie Douglas. Doy Hutching, MD, at [facility].   PRIMARY CARDIOLOGIST: Corey Skains, MD  HISTORY OF PRESENTING ILLNESS: A 74 year old Caucasian male patient with history of CABG and multiple PCI's along with hypertension, presents to the Emergency Room complaining of on-and-off chest pain for about 2 weeks. The patient's last episode was on Tuesday, 04/08/2014. Pain resolved with nitroglycerin. He called Dr. Nehemiah Massed and was set up for outpatient followup in his office on 04/14/2014, but today, the patient was ready to get to his gym, but had severe midsternal chest pain and felt extremely lightheaded and states that he almost passed out because of the pain. He took nitroglycerin pills, and the pain completely resolved with the third pill. He did not have any shortness of breath, nausea, vomiting, but did have diaphoresis. Here in the Emergency Room, the patient's EKG does not show any ST elevation. His troponin is less than 0.02. No tachycardia, no hypoxia, and the patient is being admitted to the hospitalist service for unstable angina.   The patient's last stress test was in December 2014, which was normal per patient. His last cardiac catheterization was in 2005.   PAST MEDICAL HISTORY:  1. CAD, status post CABG.  2. Hyperlipidemia.  3. Hypertension.  4. Gout.  5. History of epistaxis on Plavix, which was discontinued.  6. Skin cancer, status post resection.  7. SVT.  8. Atrial flutter/fibrillation.   PAST SURGICAL HISTORY:  1. CABG in 1992.  2. Stent placement in 2002, 2005 and 2011.   SOCIAL HISTORY: The patient lives with his wife. No alcohol. No illicit drugs. He retired from [employer].   FAMILY HISTORY: CAD, diabetes, CVA.   ALLERGIES: PERCOCET, CODEINE.   HOME MEDICATIONS: Complete list not available, but reviewing old records, he was on: 1.  Toprol-XL 100 mg daily.  2. Vytorin 1 tablet by mouth daily.  3. Benicar/hydrochlorothiazide 10/12.5 daily.  4. Probenecid 500 mg b.i.d.  5. Nabumetone 500 mg b.i.d.  6. Prilosec 20 mg b.i.d.  7. Aspirin 325 mg daily.  8. Ultram 50 mg every 6 hours as needed.  9. Nitroglycerin pills p.r.n.  10. Coumadin of unknown dose daily.   REVIEW OF SYSTEMS:  CONSTITUTIONAL: No fever, fatigue, weakness.  EYES: No blurred vision, pain or redness.  ENT: No tinnitus, ear pain, hearing loss.  RESPIRATORY: No cough, wheezing, hemoptysis. CARDIOVASCULAR: Has chest pain.  GASTROINTESTINAL: No nausea, vomiting, diarrhea or abdominal pain.  GENITOURINARY: No dysuria, hematuria or frequency.  ENDOCRINE: No polyuria, nocturia or thyroid problems.  HEMATOLOGIC AND LYMPHATIC: No anemia, easy bruising or bleeding.  INTEGUMENTARY: No acne, rash, lesion.  MUSCULOSKELETAL: No back pain or arthritis.  NEUROLOGIC: No focal numbness, weakness or seizure.  PSYCHIATRIC: No anxiety or depression.   PHYSICAL EXAMINATION:  VITAL SIGNS: Show temperature 97.4, pulse of 57, a blood pressure of 116/58, saturating 97% on room air.  GENERAL: Moderately built Caucasian male patient lying in bed, comfortable, conversational, cooperative with exam.  PSYCHIATRIC: Alert and oriented x3. Mood and affect appropriate. Judgment intact.  HEENT: Atraumatic, normocephalic. Oral mucosa moist and pink. External ears and nose normal. No pallor. No icterus. Pupils bilaterally equal and reactive to light. NECK: Supple. No thyromegaly. No palpable lymph nodes. Trachea midline. No carotid bruit or JVD.  CARDIOVASCULAR: S1, S2, without any murmurs. Peripheral pulses 2+. No edema. Scar from prior CABG. No  chest wall tenderness.  RESPIRATORY: Normal work of breathing. Clear to auscultation on both sides.  GASTROINTESTINAL: Soft abdomen, nontender. Bowel sounds present. No hepatosplenomegaly palpable.  SKIN: Warm and dry. No petechiae, rash or  ulcers.  MUSCULOSKELETAL: No joint swelling, redness, effusion of the large joints. Normal muscle tone.  GENITOURINARY: No CVA tenderness or bladder distention.  NEUROLOGICAL: Motor strength 5/5 in upper and lower extremities. Sensation to fine touch intact all over. LYMPHATIC: No cervical lymphadenopathy.   LABORATORY STUDIES:  Show glucose of 128, BUN 25, creatinine 1.93, sodium 130, potassium 3.6. AST, ALT, alkaline phosphatase and bilirubin normal.  Troponin less than 0.02.  WBC 3.6, hemoglobin 13.1, platelets of 195 with INR of 2.3.  EKG shows normal sinus rhythm. No acute ST wave changes.  Chest x-ray shows no active cardiopulmonary disease.   ASSESSMENT AND PLAN:  1. Unstable angina in a 74 year old patient with history of coronary artery bypass grafting, with his last percutaneous coronary intervention in 2011. It is strange that the patient has been going to his gym and exerting himself without any chest pain with that. His pain is not related to his food. His EKG looks unchanged. Cardiac enzymes are normal. Will consult Dr. Nehemiah Massed. Will get 2 more sets of cardiac enzymes. Although his elevated creatinine and being on Coumadin are limitations for getting a cardiac catheterization at this time, hopefully, the patient can be ruled out and can be scheduled for cardiac catheterization as outpatient. Further management per Dr. Alveria Apley input and cardiac enzymes.  2. Chronic kidney disease stage III. The patient's baseline creatinine seems to be around 1.5. Today, it is at 1.93. Could be progressively worsening versus acute renal insufficiency. Will start him on IV fluids and monitor in the morning.  3. Hypertension. Continue home medications.  4. Atrial fibrillation/flutter, rate controlled, in normal sinus rhythm at this time. Therapeutic INR. Hold couamdin for now if Cath planned. Restart if no cath. 5. Deep vein thrombosis prophylaxis. The patient is on Coumadin.   CODE STATUS: Full  code.   TIME SPENT TODAY ON THIS CASE: 45 minutes.    ____________________________ Leia Alf Arik Husmann, MD srs:lb D: 04/11/2014 10:43:45 ET T: 04/11/2014 10:55:50 ET JOB#: [id number]  cc: Alveta Heimlich R. Verlena Marlette, MD, <Dictator> Leonie Douglas. Doy Hutching, MD Corey Skains, MD Neita Carp MD ELECTRONICALLY SIGNED 04/11/2014 12:50

## 2015-04-18 NOTE — Discharge Summary (Signed)
PATIENT NAME:  Robert Rivas, RETTER MR#:  [id number] DATE OF BIRTH:  01-09-1941  DATE OF ADMISSION:  04/11/2014 DATE OF DISCHARGE:  04/12/2014  DISCHARGE DIAGNOSIS: Chest pain with history of coronary artery disease and atrial fibrillation.   DISCHARGE MEDICATIONS: 1.  Amiodarone 200 mg p.o. daily.  2.  Amlodipine 10 mg p.o. at bedtime.  3.  Vytorin 10/20, 1 p.o. daily at bedtime. 4.  Nabumetone 500 mg p.o. b.i.d. as needed for pain.  5.  Probenecid 500 mg p.o. b.i.d.  6.  Benicar HCT 12.5/20, 1 tab p.o. daily.  7.  Docusate 1 capsule at bedtime.   MEDICATIONS TO HOLD: Currently holding Coumadin.   CONSULTS: Cardiology.   PROCEDURES: None.   PERTINENT LABORATORIES AND STUDIES: Cardiac enzymes were negative x 3. EKG showed no acute changes. On day of discharge, white blood cell count 3.6, hemoglobin 13.1, and platelets of 195. INR of 2.3. Creatinine 1.68, sodium 132, potassium 3.9.   BRIEF HOSPITAL COURSE: Chest pain: The patient initially came in with complaints of chest pain concerning for unstable angina with history of coronary artery disease and A. fib. He was in sinus rhythm with no acute EKG changes and no cardiac enzyme changes on exam. Given his history, he was admitted for rule out. He was successfully ruled out with negative cardiac enzymes, no further chest discomfort, and negative EKG. At this time, we are waiting for cardiology to evaluate the patient. Coumadin is being held in case a procedure or invasive intervention will be performed. There will need to be an addendum to this discharge summary per cardiology depending on what they decide to do. From my standpoint, the patient is cleared and stable for discharge, if cleared by cardiology. He will need to follow up with Dr. Nehemiah Massed as an outpatient and follow up with Dr. Doy Hutching as needed.   ____________________________ Dion Body, MD kl:jcm D: 04/12/2014 06:51:17 ET T: 04/12/2014 16:11:59 ET JOB#: [id number]  cc: Dion Body, MD, <Dictator> Dion Body MD ELECTRONICALLY SIGNED 04/17/2014 10:45

## 2015-06-10 DIAGNOSIS — I10 Essential (primary) hypertension: Secondary | ICD-10-CM | POA: Insufficient documentation

## 2015-07-13 ENCOUNTER — Encounter: Payer: Self-pay | Admitting: Emergency Medicine

## 2015-07-13 ENCOUNTER — Emergency Department
Admission: EM | Admit: 2015-07-13 | Discharge: 2015-07-13 | Disposition: A | Discharge status: Home / Self Care | Payer: Medicare Other | Attending: Emergency Medicine | Admitting: Emergency Medicine

## 2015-07-13 DIAGNOSIS — R040 Epistaxis: Secondary | ICD-10-CM | POA: Diagnosis present

## 2015-07-13 DIAGNOSIS — I10 Essential (primary) hypertension: Secondary | ICD-10-CM | POA: Insufficient documentation

## 2015-07-13 HISTORY — DX: Old myocardial infarction: I25.2

## 2015-07-13 HISTORY — DX: Essential (primary) hypertension: I10

## 2015-07-13 LAB — CBC WITH DIFFERENTIAL/PLATELET
BASOS ABS: 0.1 10*3/uL (ref 0–0.1)
Basophils Relative: 3 %
EOS PCT: 3 %
Eosinophils Absolute: 0.1 10*3/uL (ref 0–0.7)
HCT: 38.4 % — ABNORMAL LOW (ref 40.0–52.0)
HEMOGLOBIN: 13.1 g/dL (ref 13.0–18.0)
Lymphocytes Relative: 17 %
Lymphs Abs: 0.6 10*3/uL — ABNORMAL LOW (ref 1.0–3.6)
MCH: 32.8 pg (ref 26.0–34.0)
MCHC: 34.2 g/dL (ref 32.0–36.0)
MCV: 95.7 fL (ref 80.0–100.0)
MONOS PCT: 9 %
Monocytes Absolute: 0.3 10*3/uL (ref 0.2–1.0)
Neutro Abs: 2.6 10*3/uL (ref 1.4–6.5)
Neutrophils Relative %: 68 %
Platelets: 185 10*3/uL (ref 150–440)
RBC: 4.01 MIL/uL — AB (ref 4.40–5.90)
RDW: 13.1 % (ref 11.5–14.5)
WBC: 3.8 10*3/uL (ref 3.8–10.6)

## 2015-07-13 LAB — PROTIME-INR
INR: 1.64
PROTHROMBIN TIME: 19.6 s — AB (ref 11.4–15.0)

## 2015-07-13 MED ORDER — OXYMETAZOLINE HCL 0.05 % NA SOLN: 1.0000 | Freq: Once | NASAL | Status: DC

## 2015-07-13 MED ORDER — PHENYLEPHRINE HCL 0.5 % NA SOLN: 1.0000 [drp] | Freq: Four times a day (QID) | NASAL | Status: DC | PRN

## 2015-07-13 MED ORDER — PHENYLEPHRINE HCL 0.5 % NA SOLN
NASAL | Status: AC
Start: 2015-07-13 — End: 2015-07-13
  Administered 2015-07-13: 19:00:00
  Filled 2015-07-13: qty 15

## 2015-07-13 NOTE — ED Provider Notes (Signed)
[facility] Emergency Department Provider Note   ____________________________________________  Time seen: 7 PM I have reviewed the triage vital signs and the triage nursing note.  HISTORY  Chief Complaint Epistaxis   Historian Patient  HPI Robert Rivas is a 74 y.o. male whose complaint is epistaxis. Started around 3:30 PM it was on and off for several hours. He does take Coumadin for history of A. fib and history of cardiac. Known nasal trauma. Has had nosebleeds in the past. Symptoms are mild. Nose clip was placed on triage   Past Medical History  Diagnosis Date  . MI, old   . Hypertension     There are no active problems to display for this patient.   Past Surgical History  Procedure Laterality Date  . Coronary artery bypass graft    . Cardaic stents    . Hernia repair      No current outpatient prescriptions on file.  Allergies Codeine  No family history on file.  Social History History  Substance Use Topics  . Smoking status: Never Smoker   . Smokeless tobacco: Not on file  . Alcohol Use: No    Review of Systems  Constitutional: Negative for recent illness. Eyes:  ENT: Occasional sinus congestion Cardiovascular: Negative for chest pain. Respiratory: Negative for shortness of breath. Gastrointestinal: Negative for abdominal pain, vomiting and diarrhea. Genitourinary: . Musculoskeletal: Skin: Negative for rash. Neurological: Negative for headaches, focal weakness or numbness. 10 point Review of Systems otherwise negative ____________________________________________   PHYSICAL EXAM:  VITAL SIGNS: ED Triage Vitals  Enc Vitals Group     BP 07/13/15 1718 169/70 mmHg     Pulse Rate 07/13/15 1718 68     Resp 07/13/15 1718 18     Temp 07/13/15 1718 97.4 F (36.3 C)     Temp Source 07/13/15 1718 Oral     SpO2 07/13/15 1718 99 %     Weight 07/13/15 1718 215 lb (97.523 kg)     Height 07/13/15 1718 6\' 2"  (1.88 m)   Head Cir --      Peak Flow --      Pain Score 07/13/15 1719 0     Pain Loc --      Pain Edu? --      Excl. in [place]? --      Constitutional: Alert and oriented. Well appearing and in no distress. Eyes: Conjunctivae are normal. PERRL. Normal extraocular movements. ENT   Head: Normocephalic and atraumatic.   Nose: No congestion/rhinnorhea. Left anterior septum hyperemic with no focal scab or bleeding site noted. No active bleeding.   Mouth/Throat: Mucous membranes are moist.   Neck: No stridor. Cardiovascular/Chest: Normal distal pulses Respiratory: Normal respiratory effort without tachypnea nor retractions.  Gastrointestinal: Genitourinary/rectal:Deferred Musculoskeletal: Nontender with normal range of motion in all extremities.  Neurologic:  Normal speech and language. No gross or focal neurologic deficits are appreciated. Skin:  Skin is warm, dry and intact. No rash noted. Psychiatric: Mood and affect are normal. Speech and behavior are normal. Patient exhibits appropriate insight and judgment.  ____________________________________________   EKG I, Lisa Roca, MD, the attending physician have personally viewed and interpreted all ECGs.  None ____________________________________________  LABS (pertinent positives/negatives)  CBC within normal limits INR 1.64  ____________________________________________  RADIOLOGY All Xrays were viewed by me. Imaging interpreted by Radiologist.  None __________________________________________  PROCEDURES  Procedure(s) performed: None Critical Care performed: None  ____________________________________________   ED COURSE / ASSESSMENT AND PLAN  CONSULTATIONS: None  Pertinent labs & imaging results that were available during my care of the patient were reviewed by me and considered in my medical decision making (see chart for details).   Nose bleed controlled after 30 minutes with his aunt. Afrin spray used. No  site or focal cauterization visualized. No history of hypertension. Patient's INR is 1.64, and I have asked him to go ahead and continue his usual dose without increasing or decreasing. He does need to have his INR rechecked in 3-4 days.  Patient / Family / Caregiver informed of clinical course, medical decision-making process, and agree with plan.   I discussed return precautions, follow-up instructions, and discharged instructions with patient and/or family.  ___________________________________________   FINAL CLINICAL IMPRESSION(S) / ED DIAGNOSES   Final diagnoses:  Anterior epistaxis      Lisa Roca, MD 07/13/15 1910

## 2015-07-13 NOTE — ED Notes (Signed)
Nose clip off 30 minutes now, no bleed noted.

## 2015-07-13 NOTE — Discharge Instructions (Signed)
We discussed your INR was 1.6, which is slightly low. Continue your Coumadin at the regular dose. We discussed I'm not going to increase her dose at this point in time because of the active nosebleeds. You will need your INR rechecked however in 3-4 days with your primary care doctor.  If your nose starts to bleed again, hold pressure with fingers or nose clip for 10-15 minutes. Return to the emergency Department if you have recurrent nosebleed that will not stop bleeding after 10-15 minutes of pressure at home.   Nosebleed A nosebleed can be caused by many things, including:  Getting hit hard in the nose.  Infections.  Dry nose.  Colds.  Medicines. Your doctor may do lab testing if you get nosebleeds a lot and the cause is not known. HOME CARE   If your nose was packed with material, keep it there until your doctor takes it out. Put the pack back in your nose if the pack falls out.  Do not blow your nose for 12 hours after the nosebleed.  Sit up and bend forward if your nose starts bleeding again. Pinch the front half of your nose nonstop for 20 minutes.  Put petroleum jelly inside your nose every morning if you have a dry nose.  Use a humidifier to make the air less dry.  Do not take aspirin.  Try not to strain, lift, or bend at the waist for many days after the nosebleed. GET HELP RIGHT AWAY IF:   Nosebleeds keep happening and are hard to stop or control.  You have bleeding or bruises that are not normal on other parts of the body.  You have a fever.  The nosebleeds get worse.  You get lightheaded, feel faint, sweaty, or throw up (vomit) blood. MAKE SURE YOU:   Understand these instructions.  Will watch your condition.  Will get help right away if you are not doing well or get worse. Document Released: 09/20/2008 Document Revised: 03/05/2012 Document Reviewed: 09/20/2008 [facility] Patient Information 2015 [place], [place]. This information is not intended to  replace advice given to you by your health care provider. Make sure you discuss any questions you have with your health care provider.

## 2015-07-13 NOTE — ED Notes (Signed)
Nosebleed started without injury, pt on coumadin

## 2015-11-19 ENCOUNTER — Emergency Department
Admission: EM | Admit: 2015-11-19 | Discharge: 2015-11-19 | Disposition: A | Discharge status: Home / Self Care | Payer: Medicare Other | Attending: Student | Admitting: Student

## 2015-11-19 ENCOUNTER — Encounter: Payer: Self-pay | Admitting: *Deleted

## 2015-11-19 DIAGNOSIS — I10 Essential (primary) hypertension: Secondary | ICD-10-CM | POA: Diagnosis not present

## 2015-11-19 DIAGNOSIS — L0211 Cutaneous abscess of neck: Secondary | ICD-10-CM | POA: Diagnosis not present

## 2015-11-19 HISTORY — DX: Atherosclerotic heart disease of native coronary artery without angina pectoris: I25.10

## 2015-11-19 MED ORDER — TRAMADOL HCL 50 MG PO TABS: 50.0000 mg | ORAL_TABLET | Freq: Four times a day (QID) | ORAL | Status: DC | PRN

## 2015-11-19 MED ORDER — SULFAMETHOXAZOLE-TRIMETHOPRIM 800-160 MG PO TABS: 1.0000 | ORAL_TABLET | Freq: Two times a day (BID) | ORAL | Status: DC

## 2015-11-19 NOTE — Discharge Instructions (Signed)
Abscess An abscess (boil or furuncle) is an infected area on or under the skin. This area is filled with yellowish-white fluid (pus) and other material (debris). HOME CARE   Only take medicines as told by your doctor.  If you were given antibiotic medicine, take it as directed. Finish the medicine even if you start to feel better.  If gauze is used, follow your doctor's directions for changing the gauze.  To avoid spreading the infection:  Keep your abscess covered with a bandage.  Wash your hands well.  Do not share personal care items, towels, or whirlpools with others.  Avoid skin contact with others.  Keep your skin and clothes clean around the abscess.  Keep all doctor visits as told. GET HELP RIGHT AWAY IF:   You have more pain, puffiness (swelling), or redness in the wound site.  You have more fluid or blood coming from the wound site.  You have muscle aches, chills, or you feel sick.  You have a fever. MAKE SURE YOU:   Understand these instructions.  Will watch your condition.  Will get help right away if you are not doing well or get worse.   This information is not intended to replace advice given to you by your health care provider. Make sure you discuss any questions you have with your health care provider.   Document Released: 05/30/2008 Document Revised: 06/12/2012 Document Reviewed: 02/25/2012 Elsevier Interactive Patient Education 2016 [place] warm compresses to area frequently. Septra DS twice a day for 10 days and tramadol for pain as needed. Return to the emergency room if area begins to soften so that he can be opened and drained.

## 2015-11-19 NOTE — ED Notes (Signed)
Behind right ear has abcess

## 2015-11-19 NOTE — ED Provider Notes (Signed)
[facility] Emergency Department Provider Note  ____________________________________________  Time seen: Approximately 10:12 AM  I have reviewed the triage vital signs and the nursing notes.   HISTORY  Chief Complaint Abscess   HPI VED MACKELLAR is a 74 y.o. male is here with complaint of abscess behind his right ear. Patient and wife state that it came up yesterday and is getting bigger. 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 Patient denies any fever or chills. There's been no nausea vomiting. Patient states that he has had abscesses in the past and does not get them frequently. He reports that he currently his pain is 2 out of 10.   Past Medical History  Diagnosis Date  . MI, old   . Hypertension   . Coronary artery disease     There are no active problems to display for this patient.   Past Surgical History  Procedure Laterality Date  . Coronary artery bypass graft    . Cardaic stents    . Hernia repair      Current Outpatient Rx  Name  Route  Sig  Dispense  Refill  . sulfamethoxazole-trimethoprim (BACTRIM DS,SEPTRA DS) 800-160 MG tablet   Oral   Take 1 tablet by mouth 2 (two) times daily.   20 tablet   0   . traMADol (ULTRAM) 50 MG tablet   Oral   Take 1 tablet (50 mg total) by mouth every 6 (six) hours as needed.   20 tablet   0     Allergies Codeine  No family history on file.  Social History Social History  Substance Use Topics  . Smoking status: Never Smoker   . Smokeless tobacco: None  . Alcohol Use: No    Review of Systems Constitutional: No fever/chills Eyes: No visual changes. ENT: No sore throat. Cardiovascular: Denies chest pain. Respiratory: Denies shortness of breath. Gastrointestinal:   No nausea, no vomiting. Skin: Negative for rash. Positive abscess Neurological: Negative for headaches, focal weakness or numbness.  10-point ROS  otherwise negative.  ____________________________________________   PHYSICAL EXAM:  VITAL SIGNS: ED Triage Vitals  Enc Vitals Group     BP 11/19/15 0928 132/66 mmHg     Pulse Rate 11/19/15 0928 79     Resp 11/19/15 0928 20     Temp 11/19/15 0928 97.6 F (36.4 C)     Temp Source 11/19/15 0928 Oral     SpO2 11/19/15 0928 97 %     Weight 11/19/15 0928 210 lb (95.255 kg)     Height 11/19/15 0928 6\' 2"  (1.88 m)     Head Cir --      Peak Flow --      Pain Score 11/19/15 0929 2     Pain Loc --      Pain Edu? --      Excl. in [place]? --     Constitutional: Alert and oriented. Well appearing and in no acute distress. Eyes: Conjunctivae are normal. PERRL. EOMI. Head: Atraumatic. Nose: No congestion/rhinnorhea. Neck: No stridor.   Hematological/Lymphatic/Immunilogical: No cervical lymphadenopathy. Cardiovascular: Normal rate, regular rhythm. Grossly normal heart sounds.  Good peripheral circulation. Respiratory: Normal respiratory effort.  No retractions. Lungs CTAB. Gastrointestinal: Soft and nontender. No distention. Musculoskeletal: No lower extremity tenderness nor edema.  No joint effusions. Neurologic:  Normal speech and language. No gross focal neurologic deficits are appreciated. No gait instability. Skin:  Skin is  warm, dry and intact. Small round erythematous 2 cm nodule present right lateral neck behind the right ear. Area is slightly tender and extremely hard. There is no fluctuant area present. Psychiatric: Mood and affect are normal. Speech and behavior are normal.  ____________________________________________   LABS (all labs ordered are listed, but only abnormal results are displayed)  Labs Reviewed - No data to display  PROCEDURES  Procedure(s) performed: None  Critical Care performed: No  ____________________________________________   INITIAL IMPRESSION / ASSESSMENT AND PLAN / ED COURSE  Pertinent labs & imaging results that were available during my care  of the patient were reviewed by me and considered in my medical decision making (see chart for details).  Patient was started on Bactrim DS twice a day for 10 days. He is also given a prescription for tramadol 50 mg 1 every 6 hours when necessary pain. His wife are aware that he needs to be use warm compresses frequently to the area. They will also return if any area becomes fluctuant for drainage. ____________________________________________   FINAL CLINICAL IMPRESSION(S) / ED DIAGNOSES  Final diagnoses:  Abscess of neck      Johnn Hai, PA-C 11/19/15 1114  Joanne Gavel, MD 11/21/15 [phone]

## 2015-12-14 ENCOUNTER — Encounter: Payer: Self-pay | Admitting: *Deleted

## 2015-12-15 ENCOUNTER — Encounter
Admission: RE | Disposition: A | Discharge status: Home Health | Payer: Self-pay | Source: Ambulatory Visit | Attending: Gastroenterology

## 2015-12-15 ENCOUNTER — Ambulatory Visit: Payer: Medicare Other | Admitting: Anesthesiology

## 2015-12-15 ENCOUNTER — Ambulatory Visit
Admission: RE | Admit: 2015-12-15 | Discharge: 2015-12-15 | Disposition: A | Discharge status: Home Health | Payer: Medicare Other | Source: Ambulatory Visit | Attending: Gastroenterology | Admitting: Gastroenterology

## 2015-12-15 ENCOUNTER — Encounter: Payer: Self-pay | Admitting: *Deleted

## 2015-12-15 DIAGNOSIS — Z86010 Personal history of colonic polyps: Secondary | ICD-10-CM | POA: Diagnosis present

## 2015-12-15 DIAGNOSIS — Z79899 Other long term (current) drug therapy: Secondary | ICD-10-CM | POA: Insufficient documentation

## 2015-12-15 DIAGNOSIS — Z85828 Personal history of other malignant neoplasm of skin: Secondary | ICD-10-CM | POA: Insufficient documentation

## 2015-12-15 DIAGNOSIS — D122 Benign neoplasm of ascending colon: Secondary | ICD-10-CM | POA: Insufficient documentation

## 2015-12-15 DIAGNOSIS — D125 Benign neoplasm of sigmoid colon: Secondary | ICD-10-CM | POA: Diagnosis not present

## 2015-12-15 DIAGNOSIS — I2510 Atherosclerotic heart disease of native coronary artery without angina pectoris: Secondary | ICD-10-CM | POA: Diagnosis not present

## 2015-12-15 DIAGNOSIS — N189 Chronic kidney disease, unspecified: Secondary | ICD-10-CM | POA: Diagnosis not present

## 2015-12-15 DIAGNOSIS — E785 Hyperlipidemia, unspecified: Secondary | ICD-10-CM | POA: Insufficient documentation

## 2015-12-15 DIAGNOSIS — I4891 Unspecified atrial fibrillation: Secondary | ICD-10-CM | POA: Diagnosis not present

## 2015-12-15 DIAGNOSIS — D123 Benign neoplasm of transverse colon: Secondary | ICD-10-CM | POA: Diagnosis not present

## 2015-12-15 DIAGNOSIS — I129 Hypertensive chronic kidney disease with stage 1 through stage 4 chronic kidney disease, or unspecified chronic kidney disease: Secondary | ICD-10-CM | POA: Insufficient documentation

## 2015-12-15 DIAGNOSIS — Z7901 Long term (current) use of anticoagulants: Secondary | ICD-10-CM | POA: Insufficient documentation

## 2015-12-15 DIAGNOSIS — I252 Old myocardial infarction: Secondary | ICD-10-CM | POA: Diagnosis not present

## 2015-12-15 DIAGNOSIS — Z1211 Encounter for screening for malignant neoplasm of colon: Secondary | ICD-10-CM | POA: Diagnosis present

## 2015-12-15 DIAGNOSIS — K648 Other hemorrhoids: Secondary | ICD-10-CM | POA: Diagnosis not present

## 2015-12-15 DIAGNOSIS — Z800 Family history of malignant neoplasm of digestive organs: Secondary | ICD-10-CM | POA: Insufficient documentation

## 2015-12-15 HISTORY — DX: Chronic kidney disease, unspecified: N18.9

## 2015-12-15 HISTORY — DX: Gout, unspecified: M10.9

## 2015-12-15 HISTORY — DX: Phlebitis and thrombophlebitis of unspecified site: I80.9

## 2015-12-15 HISTORY — DX: Malignant (primary) neoplasm, unspecified: C80.1

## 2015-12-15 HISTORY — DX: Hyperlipidemia, unspecified: E78.5

## 2015-12-15 HISTORY — PX: COLONOSCOPY WITH PROPOFOL: SHX5780

## 2015-12-15 HISTORY — DX: Cardiac arrhythmia, unspecified: I49.9

## 2015-12-15 LAB — PROTIME-INR
INR: 1.23
Prothrombin Time: 15.7 seconds — ABNORMAL HIGH (ref 11.4–15.0)

## 2015-12-15 SURGERY — COLONOSCOPY WITH PROPOFOL
Anesthesia: General

## 2015-12-15 MED ORDER — PROPOFOL 500 MG/50ML IV EMUL
INTRAVENOUS | Status: DC | PRN
  Administered 2015-12-15: 100 ug/kg/min via INTRAVENOUS

## 2015-12-15 MED ORDER — PROPOFOL 10 MG/ML IV BOLUS
INTRAVENOUS | Status: DC | PRN
  Administered 2015-12-15: 50 mg via INTRAVENOUS

## 2015-12-15 MED ORDER — SODIUM CHLORIDE 0.9 % IV SOLN: INTRAVENOUS | Status: DC

## 2015-12-15 MED ORDER — SODIUM CHLORIDE 0.9 % IV SOLN
INTRAVENOUS | Status: DC
  Administered 2015-12-15: 1000 mL via INTRAVENOUS
  Administered 2015-12-15: 10:00:00 via INTRAVENOUS

## 2015-12-15 NOTE — Transfer of Care (Signed)
Immediate Anesthesia Transfer of Care Note  [name] [facility]  Procedure(s) Performed: Procedure(s): COLONOSCOPY WITH PROPOFOL (N/A)  Patient Location: PACU  Anesthesia Type:General  Level of Consciousness: awake, alert  and oriented  Airway & Oxygen Therapy: Patient Spontanous Breathing and Patient connected to nasal cannula oxygen  Post-op Assessment: Report given to RN and Post -op Vital signs reviewed and stable  Post vital signs: Reviewed and stable  Last Vitals:  Filed Vitals:   12/15/15 0903  BP: 125/65  Pulse: 73  Temp: 36.4 C  Resp: 16    Complications: No apparent anesthesia complications

## 2015-12-15 NOTE — Op Note (Signed)
[facility] Patient Name: Robert Rivas Procedure Date: 12/15/2015 10:16 AM MRN: [id number] Account #: [id number] Date of Birth: 02-16-1941 Admit Type: Outpatient Age: 74 Room: [facility] ENDO ROOM 3 Gender: Male Note Status: Finalized Procedure:         Colonoscopy Indications:       Family history of colon cancer in multiple first-degree                     relatives, Personal history of colonic polyps Providers:         Lollie Sails, MD Referring MD:      Leonie Douglas. Doy Hutching, MD (Referring MD) Medicines:         Monitored Anesthesia Care Complications:     No immediate complications. Procedure:         Pre-Anesthesia Assessment:                    - ASA Grade Assessment: III - A patient with severe                     systemic disease.                    After obtaining informed consent, the colonoscope was                     passed under direct vision. Throughout the procedure, the                     patient's blood pressure, pulse, and oxygen saturations                     were monitored continuously. The Colonoscope was                     introduced through the anus and advanced to the the cecum,                     identified by appendiceal orifice and ileocecal valve. The                     colonoscopy was performed without difficulty. The patient                     tolerated the procedure well. The quality of the bowel                     preparation was good except the cecum was fair. Findings:      A 2 mm polyp was found at the hepatic flexure. The polyp was flat. The       polyp was removed with a cold biopsy forceps. Resection and retrieval       were complete.      Two sessile polyps were found in the proximal ascending colon. The       polyps were 2 to 3 mm in size.      Three sessile polyps were found in the distal sigmoid colon. The polyps       were 1 to 2 mm in size. These polyps were removed with a cold biopsy   forceps. Resection and retrieval were complete.      A less than 1 mm polyp was found in the rectum. The polyp was sessile.       The polyp was removed with a  cold biopsy forceps. Resection and       retrieval were complete.      A tattoo was seen in the rectum. A post-polypectomy scar was found at       the tattoo site. There was no evidence of residual polyp tissue.      Non-bleeding internal hemorrhoids were found during retroflexion. The       hemorrhoids were small.      The digital rectal exam was normal.      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 Impression:        - One 2 mm polyp at the hepatic flexure. Resected and                     retrieved.                    - Two 2 to 3 mm polyps in the proximal ascending colon.                    - Three 1 to 2 mm polyps in the distal sigmoid colon.                     Resected and retrieved.                    - One less than 1 mm polyp in the rectum. Resected and                     retrieved.                    - A tattoo was seen in the rectum. A post-polypectomy scar                     was found at the tattoo site. There was no evidence of                     residual polyp tissue.                    - Non-bleeding internal hemorrhoids. Recommendation:    - Discharge patient to home.                    - Await pathology results.                    - Telephone GI clinic for pathology results in 1 week. Procedure Code(s): --- Professional ---                    [phone], Colonoscopy, flexible; with biopsy, single or                     multiple Diagnosis Code(s): --- Professional ---                    D12.3, Benign neoplasm of transverse colon                    K62.1, Rectal polyp                    D12.2, Benign neoplasm of ascending colon  D12.5, Benign neoplasm of sigmoid colon                    K64.8, Other hemorrhoids                     Z80.0, Family history of malignant neoplasm of digestive                     organs                    Z86.010, Personal history of colonic polyps CPT copyright 2014 American Medical Association. All rights reserved. The codes documented in this report are preliminary and upon coder review may  be revised to meet current compliance requirements. Lollie Sails, MD 12/15/2015 11:03:35 AM This report has been signed electronically. Number of Addenda: 0 Note Initiated On: 12/15/2015 10:16 AM Scope Withdrawal Time: 0 hours 14 minutes 10 seconds  Total Procedure Duration: 0 hours 29 minutes 25 seconds       [facility]

## 2015-12-15 NOTE — Anesthesia Preprocedure Evaluation (Signed)
Anesthesia Evaluation  Patient identified by MRN, date of birth, ID band Patient awake    Reviewed: Allergy & Precautions, NPO status , Patient's Chart, lab work & pertinent test results, reviewed documented beta blocker date and time   Airway Mallampati: II  TM Distance: >3 FB     Dental  (+) Chipped   Pulmonary           Cardiovascular hypertension, Pt. on medications + CAD and + Past MI  + dysrhythmias Atrial Fibrillation      Neuro/Psych    GI/Hepatic   Endo/Other    Renal/GU Renal InsufficiencyRenal disease     Musculoskeletal   Abdominal   Peds  Hematology   Anesthesia Other Findings Hx of SVT.Hx of CABG.  Reproductive/Obstetrics                             Anesthesia Physical Anesthesia Plan  ASA: III  Anesthesia Plan: General   Post-op Pain Management:    Induction: Intravenous  Airway Management Planned: Nasal Cannula  Additional Equipment:   Intra-op Plan:   Post-operative Plan:   Informed Consent: I have reviewed the patients History and Physical, chart, labs and discussed the procedure including the risks, benefits and alternatives for the proposed anesthesia with the patient or authorized representative who has indicated his/her understanding and acceptance.     Plan Discussed with: CRNA  Anesthesia Plan Comments:         Anesthesia Quick Evaluation

## 2015-12-15 NOTE — H&P (Signed)
Outpatient short stay form Pre-procedure 12/15/2015 10:07 AM Lollie Sails MD  Primary Physician: Dr. Fulton Reek  Reason for visit:  Colonoscopy  History of present illness:  Patient is a 74 year old male presenting today for a colonoscopy. He has a personal history of adenomatous colon polyps. Also has multiple primary relatives who have colon cancer including father and daughter. He tolerated his prep well. He takes no aspirin products however does take Coumadin. We are currently waiting on his proton.    Current facility-administered medications:  .  0.9 %  sodium chloride infusion, , Intravenous, Continuous, Lollie Sails, MD, Last Rate: 20 mL/hr at 12/15/15 0927, 1,000 mL at 12/15/15 0927 .  0.9 %  sodium chloride infusion, , Intravenous, Continuous, Lollie Sails, MD  Prescriptions prior to admission  Medication Sig Dispense Refill Last Dose  . amiodarone (PACERONE) 200 MG tablet Take 200 mg by mouth daily.   12/15/2015 at 0700  . amLODipine (NORVASC) 10 MG tablet Take 10 mg by mouth daily.   12/15/2015 at 0700  . chlorpheniramine-HYDROcodone (TUSSIONEX PENNKINETIC ER) 10-8 MG/5ML SUER Take 5 mLs by mouth.     . docusate sodium (COLACE) 100 MG capsule Take 100 mg by mouth daily.     Marland Kitchen ezetimibe (ZETIA) 10 MG tablet Take 10 mg by mouth daily.     . isosorbide dinitrate (ISORDIL) 10 MG tablet Take 30 mg by mouth 2 (two) times daily.   12/15/2015 at 0700  . nabumetone (RELAFEN) 500 MG tablet Take 500 mg by mouth 2 (two) times daily.     . nitroGLYCERIN (NITROSTAT) 0.4 MG SL tablet Place 0.4 mg under the tongue every 5 (five) minutes as needed for chest pain.     . probenecid (BENEMID) 500 MG tablet Take 500 mg by mouth daily.   12/15/2015 at 0700  . simvastatin (ZOCOR) 20 MG tablet Take 20 mg by mouth daily.     . tamsulosin (FLOMAX) 0.4 MG CAPS capsule Take 0.4 mg by mouth 2 (two) times daily.     Marland Kitchen VITAMIN B1-B12 IM Inject 1,000 mg into the muscle every 30 (thirty)  days.     Marland Kitchen warfarin (COUMADIN) 2 MG tablet Take 3 mg by mouth daily. (1 1/2 tablet QD)   Past Week at Unknown time  . sulfamethoxazole-trimethoprim (BACTRIM DS,SEPTRA DS) 800-160 MG tablet Take 1 tablet by mouth 2 (two) times daily. (Patient not taking: Reported on 12/15/2015) 20 tablet 0 Completed Course at Unknown time  . traMADol (ULTRAM) 50 MG tablet Take 1 tablet (50 mg total) by mouth every 6 (six) hours as needed. 20 tablet 0      Allergies  Allergen Reactions  . Codeine Nausea And Vomiting     Past Medical History  Diagnosis Date  . MI, old   . Hypertension   . Coronary artery disease   . Chronic kidney disease     Chronic Kidney Disease  . Gout   . Hyperlipidemia   . Dysrhythmia     Atrial Fibrillation; Supraventricular Tachycardia  . 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 Phlebitis     Review of systems:      Physical Exam    Heart and lungs: Regular rate and rhythm without rub or gallop, lungs are bilaterally clear.    HEENT: Norm cephalic atraumatic eyes are anicteric    Other:     Pertinant exam for procedure: soft nontender nondistended bowel sounds positive normoactive.    Planned proceedures:  Colonoscopy and indicated procedures. I have discussed the risks benefits and complications of procedures to include not limited to bleeding, infection, perforation and the risk of sedation and the patient wishes to proceed.    Lollie Sails, MD Gastroenterology 12/15/2015  10:07 AM

## 2015-12-15 NOTE — Anesthesia Postprocedure Evaluation (Signed)
Anesthesia Post Note  [name] [facility]  Procedure(s) Performed: Procedure(s) (LRB): COLONOSCOPY WITH PROPOFOL (N/A)  Patient location during evaluation: Endoscopy Anesthesia Type: General Level of consciousness: awake Pain management: pain level controlled Vital Signs Assessment: post-procedure vital signs reviewed and stable Respiratory status: spontaneous breathing Cardiovascular status: blood pressure returned to baseline Anesthetic complications: no    Last Vitals:  Filed Vitals:   12/15/15 1122 12/15/15 1132  BP: 113/70 119/73  Pulse: 57 59  Temp:    Resp: 13 16    Last Pain:  Filed Vitals:   12/15/15 1141  PainSc: Asleep                 Loudon Krakow S

## 2015-12-16 LAB — SURGICAL PATHOLOGY

## 2015-12-17 ENCOUNTER — Encounter: Payer: Self-pay | Admitting: Gastroenterology

## 2016-03-02 ENCOUNTER — Encounter: Payer: Self-pay | Admitting: Emergency Medicine

## 2016-03-02 ENCOUNTER — Emergency Department
Admission: EM | Admit: 2016-03-02 | Discharge: 2016-03-03 | Disposition: A | Discharge status: Home / Self Care | Payer: Medicare Other | Attending: Emergency Medicine | Admitting: Emergency Medicine

## 2016-03-02 DIAGNOSIS — Z79899 Other long term (current) drug therapy: Secondary | ICD-10-CM | POA: Diagnosis not present

## 2016-03-02 DIAGNOSIS — R040 Epistaxis: Secondary | ICD-10-CM

## 2016-03-02 DIAGNOSIS — I129 Hypertensive chronic kidney disease with stage 1 through stage 4 chronic kidney disease, or unspecified chronic kidney disease: Secondary | ICD-10-CM | POA: Insufficient documentation

## 2016-03-02 DIAGNOSIS — N189 Chronic kidney disease, unspecified: Secondary | ICD-10-CM | POA: Insufficient documentation

## 2016-03-02 DIAGNOSIS — Z7901 Long term (current) use of anticoagulants: Secondary | ICD-10-CM | POA: Diagnosis not present

## 2016-03-02 LAB — CBC
HEMATOCRIT: 39.8 % — AB (ref 40.0–52.0)
Hemoglobin: 13.6 g/dL (ref 13.0–18.0)
MCH: 32.5 pg (ref 26.0–34.0)
MCHC: 34.1 g/dL (ref 32.0–36.0)
MCV: 95.3 fL (ref 80.0–100.0)
PLATELETS: 209 10*3/uL (ref 150–440)
RBC: 4.18 MIL/uL — AB (ref 4.40–5.90)
RDW: 12.9 % (ref 11.5–14.5)
WBC: 4.3 10*3/uL (ref 3.8–10.6)

## 2016-03-02 LAB — BASIC METABOLIC PANEL
ANION GAP: 9 (ref 5–15)
BUN: 28 mg/dL — ABNORMAL HIGH (ref 6–20)
CALCIUM: 9.3 mg/dL (ref 8.9–10.3)
CO2: 26 mmol/L (ref 22–32)
Chloride: 104 mmol/L (ref 101–111)
Creatinine, Ser: 1.66 mg/dL — ABNORMAL HIGH (ref 0.61–1.24)
GFR, EST AFRICAN AMERICAN: 45 mL/min — AB (ref >60)
GFR, EST NON AFRICAN AMERICAN: 38 mL/min — AB (ref >60)
GLUCOSE: 124 mg/dL — AB (ref 65–99)
POTASSIUM: 3.4 mmol/L — AB (ref 3.5–5.1)
Sodium: 139 mmol/L (ref 135–145)

## 2016-03-02 LAB — PROTIME-INR
INR: 2.43
Prothrombin Time: 26.1 seconds — ABNORMAL HIGH (ref 11.4–15.0)

## 2016-03-02 MED ORDER — SILVER NITRATE-POT NITRATE 75-25 % EX MISC
1.0000 | Freq: Once | CUTANEOUS | Status: AC
  Administered 2016-03-02: 1 via TOPICAL

## 2016-03-02 MED ORDER — OXYMETAZOLINE HCL 0.05 % NA SOLN
1.0000 | Freq: Once | NASAL | Status: AC
  Administered 2016-03-02: 1 via NASAL

## 2016-03-02 MED ORDER — SILVER NITRATE-POT NITRATE 75-25 % EX MISC
CUTANEOUS | Status: AC
  Administered 2016-03-02: 1 via TOPICAL
  Filled 2016-03-02: qty 1

## 2016-03-02 MED ORDER — OXYMETAZOLINE HCL 0.05 % NA SOLN
NASAL | Status: AC
  Administered 2016-03-02: 1 via NASAL
  Filled 2016-03-02: qty 15

## 2016-03-02 NOTE — ED Provider Notes (Signed)
[facility] Emergency Department Provider Note  Time seen: 11:10 PM  I have reviewed the triage vital signs and the nursing notes.   HISTORY  Chief Complaint Epistaxis    HPI Robert Rivas is a 75 y.o. male with a past medical history of hypertension, hyperlipidemia, cancer, MI, on Coumadin, presents to the emergency department with a nosebleed. According to the patient he recently had his Coumadin level checked and was told it was low, he took 3 Coumadin tablets last night and 2 Coumadin tablets tonight as ordered by his primary care doctor. Patient states around 9:30 PM tonight he began having a nosebleed, he is unable to stop it at home so he came to the emergency department. Patient states over the past few months he has had multiple nosebleeds. Denies any lightheadedness, loss of consciousness, or any other medical complaint this time.     Past Medical History  Diagnosis Date  . MI, old   . Hypertension   . Coronary artery disease   . Chronic kidney disease     Chronic Kidney Disease  . Gout   . Hyperlipidemia   . Dysrhythmia     Atrial Fibrillation; Supraventricular Tachycardia  . Cancer ([place])     Skin Cancer  . Phlebitis     There are no active problems to display for this patient.   Past Surgical History  Procedure Laterality Date  . Coronary artery bypass graft    . Cardaic stents    . Hernia repair    . Cardiac catheterization      PTCA with Stent Placement  . Colonoscopy with propofol N/A 12/15/2015    Procedure: COLONOSCOPY WITH PROPOFOL;  Surgeon: Lollie Sails, MD;  Location: [facility] ENDOSCOPY;  Service: Endoscopy;  Laterality: N/A;    Current Outpatient Rx  Name  Route  Sig  Dispense  Refill  . amiodarone (PACERONE) 200 MG tablet   Oral   Take 200 mg by mouth daily.         Marland Kitchen amLODipine (NORVASC) 10 MG tablet   Oral   Take 10 mg by mouth daily.         . chlorpheniramine-HYDROcodone (TUSSIONEX PENNKINETIC ER) 10-8  MG/5ML SUER   Oral   Take 5 mLs by mouth.         . docusate sodium (COLACE) 100 MG capsule   Oral   Take 100 mg by mouth daily.         Marland Kitchen ezetimibe (ZETIA) 10 MG tablet   Oral   Take 10 mg by mouth daily.         . isosorbide dinitrate (ISORDIL) 10 MG tablet   Oral   Take 30 mg by mouth 2 (two) times daily.         . nabumetone (RELAFEN) 500 MG tablet   Oral   Take 500 mg by mouth 2 (two) times daily.         . nitroGLYCERIN (NITROSTAT) 0.4 MG SL tablet   Sublingual   Place 0.4 mg under the tongue every 5 (five) minutes as needed for chest pain.         . probenecid (BENEMID) 500 MG tablet   Oral   Take 500 mg by mouth daily.         . simvastatin (ZOCOR) 20 MG tablet   Oral   Take 20 mg by mouth daily.         Marland Kitchen sulfamethoxazole-trimethoprim (BACTRIM DS,SEPTRA DS) 800-160 MG  tablet   Oral   Take 1 tablet by mouth 2 (two) times daily. Patient not taking: Reported on 12/15/2015   20 tablet   0   . tamsulosin (FLOMAX) 0.4 MG CAPS capsule   Oral   Take 0.4 mg by mouth 2 (two) times daily.         . traMADol (ULTRAM) 50 MG tablet   Oral   Take 1 tablet (50 mg total) by mouth every 6 (six) hours as needed.   20 tablet   0   . VITAMIN B1-B12 IM   Intramuscular   Inject 1,000 mg into the muscle every 30 (thirty) days.         Marland Kitchen warfarin (COUMADIN) 2 MG tablet   Oral   Take 3 mg by mouth daily. (1 1/2 tablet QD)           Allergies Codeine  No family history on file.  Social History Social History  Substance Use Topics  . Smoking status: Never Smoker   . Smokeless tobacco: Never Used  . Alcohol Use: No    Review of Systems Constitutional: Negative for fever. ENT: Positive for nosebleed out of left nostril Cardiovascular: Negative for chest pain. Respiratory: Negative for shortness of breath. Gastrointestinal: Negative for abdominal pain 10-point ROS otherwise  negative.  ____________________________________________   PHYSICAL EXAM:  VITAL SIGNS: ED Triage Vitals  Enc Vitals Group     BP 03/02/16 2254 172/95 mmHg     Pulse Rate 03/02/16 2303 87     Resp 03/02/16 2254 22     Temp 03/02/16 2254 97.3 F (36.3 C)     Temp Source 03/02/16 2254 Oral     SpO2 03/02/16 2252 96 %     Weight 03/02/16 2254 220 lb (99.791 kg)     Height 03/02/16 2254 6\' 2"  (1.88 m)     Head Cir --      Peak Flow --      Pain Score 03/02/16 2256 0     Pain Loc --      Pain Edu? --      Excl. in [place]? --     Constitutional: Alert and oriented. Well appearing and in no distress. Eyes: Normal exam ENT   Head: Normocephalic and atraumatic. Moderate Active bleeding from left nostril.   Mouth/Throat: Mucous membranes are moist. Cardiovascular: Normal rate, regular rhythm. No murmur Respiratory: Normal respiratory effort without tachypnea nor retractions. Breath sounds are clear Gastrointestinal: Soft and nontender. No distention.   Musculoskeletal: Nontender with normal range of motion in all extremities. Neurologic:  Normal speech and language. No gross focal neurologic deficits Skin:  Skin is warm, dry and intact.  Psychiatric: Mood and affect are normal. Speech and behavior are normal.  ____________________________________________   INITIAL IMPRESSION / ASSESSMENT AND PLAN / ED COURSE  Pertinent labs & imaging results that were available during my care of the patient were reviewed by me and considered in my medical decision making (see chart for details).  Patient presents the emergency department epistaxis on Coumadin. Blood pressure is well-controlled at this time. Patient is active bleeding out of the left nostril. Small arterial bleed visualize. Used silver nitrate to attempt chemical cauterization with good success, used Afrin, and applied nasal clamp. Currently bleeding is controlled, we will recheck in 15-20 minutes.  Labs are largely within  normal limits. INR 2.4. Patient remains hemostatic. We'll discharge home with ENT follow-up. Patient has seen Dr. Oletta Darter in the past.  ____________________________________________  FINAL CLINICAL IMPRESSION(S) / ED DIAGNOSES  Epistaxis   Harvest Dark, MD 03/02/16 2356

## 2016-03-02 NOTE — Discharge Instructions (Signed)

## 2016-03-02 NOTE — ED Notes (Signed)
MD at bedside. 

## 2016-03-02 NOTE — ED Notes (Signed)
Pt present to ED from home via [facility] c/o nosebleed starting at 9:30pm tonight. Pt reports has had four nosebleed in the past two weeks. Per EMS pt PCP decreased Warfarin, nosebleed has been on and off throughout the night. EMS gave pt Afrin x 2 and applied ice to nose without relief. Pt currently bleeding, nose clamp applied. Pt denies chest pain, dizziness, or lightheadedness. Pt alert and oriented x 4, no increased work in breathing noted, skin warm and dry.

## 2016-03-25 ENCOUNTER — Encounter: Payer: Self-pay | Admitting: Urology

## 2016-03-25 ENCOUNTER — Ambulatory Visit (INDEPENDENT_AMBULATORY_CARE_PROVIDER_SITE_OTHER): Payer: Medicare Other | Admitting: Urology

## 2016-03-25 VITALS — BP 113/65 | HR 59 | Ht 74.0 in | Wt 221.2 lb

## 2016-03-25 DIAGNOSIS — N400 Enlarged prostate without lower urinary tract symptoms: Secondary | ICD-10-CM | POA: Diagnosis not present

## 2016-03-25 DIAGNOSIS — I809 Phlebitis and thrombophlebitis of unspecified site: Secondary | ICD-10-CM | POA: Insufficient documentation

## 2016-03-25 DIAGNOSIS — I471 Supraventricular tachycardia: Secondary | ICD-10-CM | POA: Insufficient documentation

## 2016-03-25 DIAGNOSIS — Z86010 Personal history of colonic polyps: Secondary | ICD-10-CM | POA: Insufficient documentation

## 2016-03-25 DIAGNOSIS — Z9861 Coronary angioplasty status: Secondary | ICD-10-CM | POA: Insufficient documentation

## 2016-03-25 DIAGNOSIS — M109 Gout, unspecified: Secondary | ICD-10-CM | POA: Insufficient documentation

## 2016-03-25 DIAGNOSIS — R351 Nocturia: Secondary | ICD-10-CM | POA: Diagnosis not present

## 2016-03-25 LAB — URINALYSIS, COMPLETE
BILIRUBIN UA: NEGATIVE
GLUCOSE, UA: NEGATIVE
Ketones, UA: NEGATIVE
Nitrite, UA: NEGATIVE
PROTEIN UA: NEGATIVE
SPEC GRAV UA: 1.02 (ref 1.005–1.030)
UUROB: 0.2 mg/dL (ref 0.2–1.0)
pH, UA: 5.5 (ref 5.0–7.5)

## 2016-03-25 LAB — MICROSCOPIC EXAMINATION
RBC, UA: NONE SEEN /hpf (ref 0–?)
WBC, UA: >30 /hpf — ABNORMAL HIGH (ref 0–?)

## 2016-03-25 LAB — BLADDER SCAN AMB NON-IMAGING: SCAN RESULT: 96

## 2016-03-25 MED ORDER — ALFUZOSIN HCL ER 10 MG PO TB24: 10.0000 mg | ORAL_TABLET | Freq: Every day | ORAL | Status: DC

## 2016-03-25 NOTE — Progress Notes (Signed)
Bladder Scan Patient  void: 96 ml Performed By: Larna Daughters

## 2016-03-25 NOTE — Progress Notes (Signed)
03/25/2016 3:32 PM   [name] [birth year] [id number]  Referring provider: Idelle Crouch, MD [place] [facility] [place], [place] [address]  Chief Complaint  Patient presents with  . New Patient (Initial Visit)    BPH, nocturia    HPI: [name] is a 75yo seen in consultation for BPH with LUTS and nocturia.  He has nocturia 3-7x per night. IPSS score is 8 but bother score is 5.  No lower extremity swelling. No fluid within 2 hours of bedtime. He denies sleep apnea.  No caffeine after 3pm. He is currently on flomax 0.4mg   He has difficulty getting and maintaining an erection which has been present for over 5 years. PVR normal.   PMH: Past Medical History  Diagnosis Date  . MI, old   . Hypertension   . Coronary artery disease   . Chronic kidney disease     Chronic Kidney Disease  . Gout   . Hyperlipidemia   . Dysrhythmia     Atrial Fibrillation; Supraventricular Tachycardia  . Cancer ([place])     Skin Cancer  . Phlebitis     Surgical History: Past Surgical History  Procedure Laterality Date  . Coronary artery bypass graft    . Cardaic stents    . Hernia repair    . Cardiac catheterization      PTCA with Stent Placement  . Colonoscopy with propofol N/A 12/15/2015    Procedure: COLONOSCOPY WITH PROPOFOL;  Surgeon: Lollie Sails, MD;  Location: [facility] ENDOSCOPY;  Service: Endoscopy;  Laterality: N/A;    Home Medications:    Medication List       This list is accurate as of: 03/25/16  3:32 PM.  Always use your most recent med list.               amiodarone 200 MG tablet  Commonly known as:  PACERONE  Take 200 mg by mouth daily.     amiodarone 200 MG tablet  Commonly known as:  PACERONE  Take 100 mg by mouth.     amLODipine 10 MG tablet  Commonly known as:  NORVASC  Take 10 mg by mouth daily.     cyanocobalamin 1000 MCG/ML injection  Commonly known as:  (VITAMIN B-12)  Inject into the muscle.     docusate sodium 100 MG  capsule  Commonly known as:  COLACE  Take 100 mg by mouth daily.     ezetimibe 10 MG tablet  Commonly known as:  ZETIA  Take 10 mg by mouth daily.     isosorbide dinitrate 10 MG tablet  Commonly known as:  ISORDIL  Take 30 mg by mouth 2 (two) times daily.     isosorbide mononitrate 30 MG 24 hr tablet  Commonly known as:  IMDUR  Take 30 mg by mouth.     nabumetone 500 MG tablet  Commonly known as:  RELAFEN  Take 500 mg by mouth 2 (two) times daily.     nitroGLYCERIN 0.4 MG SL tablet  Commonly known as:  NITROSTAT  Place 0.4 mg under the tongue every 5 (five) minutes as needed for chest pain.     probenecid 500 MG tablet  Commonly known as:  BENEMID  Take 500 mg by mouth daily.     simvastatin 20 MG tablet  Commonly known as:  ZOCOR  Take 20 mg by mouth daily.     sulfamethoxazole-trimethoprim 800-160 MG tablet  Commonly known as:  BACTRIM DS,SEPTRA  DS  Take 1 tablet by mouth 2 (two) times daily.     tamsulosin 0.4 MG Caps capsule  Commonly known as:  FLOMAX  Take 0.4 mg by mouth 2 (two) times daily.     traMADol 50 MG tablet  Commonly known as:  ULTRAM  Take 1 tablet (50 mg total) by mouth every 6 (six) hours as needed.     TUSSIONEX PENNKINETIC ER 10-8 MG/5ML Suer  Generic drug:  chlorpheniramine-HYDROcodone  Take 5 mLs by mouth.     VITAMIN B1-B12 IM  Inject 1,000 mg into the muscle every 30 (thirty) days.     warfarin 2 MG tablet  Commonly known as:  COUMADIN  Take 3 mg by mouth daily. (1 1/2 tablet QD)        Allergies:  Allergies  Allergen Reactions  . Codeine Nausea And Vomiting and Nausea Only    Family History: Family History  Problem Relation Age of Onset  . Prostate cancer Neg Hx     Social History:  reports that he has never smoked. He has never used smokeless tobacco. He reports that he does not drink alcohol or use illicit drugs.  ROS: UROLOGY Frequent Urination?: Yes Hard to postpone urination?: No Burning/pain with urination?:  No Get up at night to urinate?: Yes Leakage of urine?: No Urine stream starts and stops?: No Trouble starting stream?: No Do you have to strain to urinate?: No Blood in urine?: No Urinary tract infection?: No Sexually transmitted disease?: No Injury to kidneys or bladder?: No Painful intercourse?: No Weak stream?: No Erection problems?: Yes Penile pain?: No  Gastrointestinal Nausea?: No Vomiting?: No Indigestion/heartburn?: No Diarrhea?: No Constipation?: No  Constitutional Fever: No Night sweats?: No Weight loss?: No Fatigue?: No  Skin Skin rash/lesions?: No Itching?: No  Eyes Blurred vision?: Yes Double vision?: No  Ears/Nose/Throat Sore throat?: No Sinus problems?: No  Hematologic/Lymphatic Swollen glands?: No Easy bruising?: Yes  Cardiovascular Leg swelling?: Yes Chest pain?: No  Respiratory Cough?: No Shortness of breath?: No  Endocrine Excessive thirst?: No  Musculoskeletal Back pain?: Yes Joint pain?: Yes  Neurological Headaches?: No Dizziness?: No  Psychologic Depression?: No Anxiety?: No  Physical Exam: BP 113/65 mmHg  Pulse 59  Ht 6\' 2"  (1.88 m)  Wt 100.336 kg (221 lb 3.2 oz)  BMI 28.39 kg/m2  Constitutional:  Alert and oriented, No acute distress. HEENT: [place] AT, moist mucus membranes.  Trachea midline, no masses. Cardiovascular: No clubbing, cyanosis, or edema. Respiratory: Normal respiratory effort, no increased work of breathing. GI: Abdomen is soft, nontender, nondistended, no abdominal masses GU: No CVA tenderness. Circumcised phallus. No masses/lesions on penis, testes, scrotum. Prostate 50mg  smooth, no induration no nodules Skin: No rashes, bruises or suspicious lesions. Lymph: No cervical or inguinal adenopathy. Neurologic: Grossly intact, no focal deficits, moving all 4 extremities. Psychiatric: Normal mood and affect.  Laboratory Data: Lab Results  Component Value Date   WBC 4.3 03/02/2016   HGB 13.6 03/02/2016    HCT 39.8* 03/02/2016   MCV 95.3 03/02/2016   PLT 209 03/02/2016    Lab Results  Component Value Date   CREATININE 1.66* 03/02/2016    No results found for: PSA  No results found for: TESTOSTERONE  No results found for: HGBA1C  Urinalysis    Component Value Date/Time   COLORURINE Yellow 10/05/2013 1518   APPEARANCEUR Hazy 10/05/2013 1518   LABSPEC 1.024 10/05/2013 1518   PHURINE 6.0 10/05/2013 1518   GLUCOSEU Negative 10/05/2013 1518   HGBUR  3+ 10/05/2013 1518   BILIRUBINUR Negative 10/05/2013 1518   KETONESUR Negative 10/05/2013 1518   PROTEINUR 100 mg/dL 10/05/2013 1518   NITRITE Negative 10/05/2013 1518   LEUKOCYTESUR 3+ 10/05/2013 1518    Pertinent Imaging:  Assessment & Plan:    1. BPH (benign prostatic hyperplasia) uroxatral 10mg  QHS - Urinalysis, Complete - Bladder Scan (Post Void Residual) in office  2. Nocturia -uraxatral 10mg   - Urinalysis, Complete - Bladder Scan (Post Void Residual) in office   No Follow-up on file.  Nicolette Bang, MD  [facility] [address] [place], [place] [address] [phone]

## 2016-04-25 ENCOUNTER — Encounter: Payer: Self-pay | Admitting: Urology

## 2016-04-25 ENCOUNTER — Ambulatory Visit (INDEPENDENT_AMBULATORY_CARE_PROVIDER_SITE_OTHER): Payer: Medicare Other | Admitting: Urology

## 2016-04-25 VITALS — BP 102/55 | HR 66 | Ht 74.0 in | Wt 217.2 lb

## 2016-04-25 DIAGNOSIS — N401 Enlarged prostate with lower urinary tract symptoms: Secondary | ICD-10-CM

## 2016-04-25 DIAGNOSIS — N400 Enlarged prostate without lower urinary tract symptoms: Secondary | ICD-10-CM | POA: Insufficient documentation

## 2016-04-25 DIAGNOSIS — R351 Nocturia: Secondary | ICD-10-CM | POA: Insufficient documentation

## 2016-04-25 DIAGNOSIS — N138 Other obstructive and reflux uropathy: Secondary | ICD-10-CM

## 2016-04-25 MED ORDER — ALFUZOSIN HCL ER 10 MG PO TB24: 10.0000 mg | ORAL_TABLET | Freq: Every day | ORAL | Status: DC

## 2016-04-25 NOTE — Progress Notes (Signed)
04/25/2016 4:05 PM   Robert Rivas 1941-11-22 [id number]  Referring provider: Idelle Crouch, MD [place] [facility] [place], [place] [address]  Chief Complaint  Patient presents with  . Benign Prostatic Hypertrophy    one month follow up  . Nocturia    HPI: Patient is a 75 year old Caucasian male with a history of nocturia and BPH with LUTS who was switched from tamsulosin to alfuzosin presents today for a 1 month follow-up.  Nocturia Patient states that prior to initiating alfuzosin 10 mg daily, he was getting up 4-5 times nightly to urinate. He is now down to 2-3 times nightly. He is satisfied with these results. He denied any history of sleep apnea.  BPH WITH LUTS His IPSS score today is 7, which is mild lower urinary tract symptomatology. He is mostly satisfied with his quality life due to his urinary symptoms. His previous IPSS score was 8/4.  His previous PVR is 96 mL.  he has noticed an improvement since switching from tamsulosin to the alfuzosin. He is still getting up 3 nights weekly, but he falls directly asleep after urinating.  He does not find that bothersome to him at this time.  He denies any dysuria, hematuria or suprapubic pain.   He also denies any recent fevers, chills, nausea or vomiting.        IPSS      03/25/16 1500 04/25/16 1500     International Prostate Symptom Score   How often have you had the sensation of not emptying your bladder? Less than 1 in 5 Less than 1 in 5    How often have you had to urinate less than every two hours? Less than half the time Less than 1 in 5 times    How often have you found you stopped and started again several times when you urinated? Less than 1 in 5 times Less than 1 in 5 times    How often have you found it difficult to postpone urination? Not at All Not at All    How often have you had a weak urinary stream? Not at All Less than 1 in 5 times    How often have you had to strain to start  urination? Not at All Not at All    How many times did you typically get up at night to urinate? 4 Times 3 Times    Total IPSS Score 8 7    Quality of Life due to urinary symptoms   If you were to spend the rest of your life with your urinary condition just the way it is now how would you feel about that? Unhappy Mostly Satisfied       Score:  1-7 Mild 8-19 Moderate 20-35 Severe   PMH: Past Medical History  Diagnosis Date  . MI, old   . Hypertension   . Coronary artery disease   . Chronic kidney disease     Chronic Kidney Disease  . Gout   . Hyperlipidemia   . Dysrhythmia     Atrial Fibrillation; Supraventricular Tachycardia  . Cancer ([place])     Skin Cancer  . Phlebitis   . BPH (benign prostatic hyperplasia)   . Nocturia     Surgical History: Past Surgical History  Procedure Laterality Date  . Coronary artery bypass graft    . Cardaic stents    . Hernia repair    . Cardiac catheterization      PTCA with  Stent Placement  . Colonoscopy with propofol N/A 12/15/2015    Procedure: COLONOSCOPY WITH PROPOFOL;  Surgeon: Lollie Sails, MD;  Location: [facility] ENDOSCOPY;  Service: Endoscopy;  Laterality: N/A;    Home Medications:    Medication List       This list is accurate as of: 04/25/16  4:05 PM.  Always use your most recent med list.               alfuzosin 10 MG 24 hr tablet  Commonly known as:  UROXATRAL  Take 1 tablet (10 mg total) by mouth daily with breakfast.     amiodarone 200 MG tablet  Commonly known as:  PACERONE  Take 100 mg by mouth daily.     amiodarone 200 MG tablet  Commonly known as:  PACERONE  Take 100 mg by mouth. Reported on 04/25/2016     amLODipine 10 MG tablet  Commonly known as:  NORVASC  Take 10 mg by mouth daily.     benzonatate 200 MG capsule  Commonly known as:  TESSALON  Take by mouth.     cyanocobalamin 1000 MCG/ML injection  Commonly known as:  (VITAMIN B-12)  Inject into the muscle.     docusate sodium 100 MG  capsule  Commonly known as:  COLACE  Take 100 mg by mouth daily.     doxycycline 100 MG capsule  Commonly known as:  VIBRAMYCIN  Take by mouth. Reported on 04/25/2016     ezetimibe 10 MG tablet  Commonly known as:  ZETIA  Take 10 mg by mouth daily.     isosorbide dinitrate 10 MG tablet  Commonly known as:  ISORDIL  Take 30 mg by mouth 2 (two) times daily.     isosorbide mononitrate 30 MG 24 hr tablet  Commonly known as:  IMDUR  Take 30 mg by mouth. Reported on 04/25/2016     nabumetone 500 MG tablet  Commonly known as:  RELAFEN  Take 500 mg by mouth 2 (two) times daily.     nitroGLYCERIN 0.4 MG SL tablet  Commonly known as:  NITROSTAT  Place 0.4 mg under the tongue every 5 (five) minutes as needed for chest pain.     probenecid 500 MG tablet  Commonly known as:  BENEMID  Take 500 mg by mouth daily.     simvastatin 20 MG tablet  Commonly known as:  ZOCOR  Take 20 mg by mouth daily.     sulfamethoxazole-trimethoprim 800-160 MG tablet  Commonly known as:  BACTRIM DS,SEPTRA DS  Take 1 tablet by mouth 2 (two) times daily.     tamsulosin 0.4 MG Caps capsule  Commonly known as:  FLOMAX  Take 0.4 mg by mouth 2 (two) times daily. Reported on 04/25/2016     traMADol 50 MG tablet  Commonly known as:  ULTRAM  Take 1 tablet (50 mg total) by mouth every 6 (six) hours as needed.     TUSSIONEX PENNKINETIC ER 10-8 MG/5ML Suer  Generic drug:  chlorpheniramine-HYDROcodone  Take 5 mLs by mouth.     VITAMIN B1-B12 IM  Inject 1,000 mg into the muscle every 30 (thirty) days.     warfarin 2 MG tablet  Commonly known as:  COUMADIN  Take 4 mg by mouth 2 (two) times daily. (1 1/2 tablet QD)        Allergies:  Allergies  Allergen Reactions  . Codeine Nausea And Vomiting and Nausea Only    Family History: Family History  Problem Relation Age of Onset  . Prostate cancer Neg Hx   . Kidney cancer Maternal Uncle     Social History:  reports that he has never smoked. He has never  used smokeless tobacco. He reports that he does not drink alcohol or use illicit drugs.  ROS: UROLOGY Frequent Urination?: No Hard to postpone urination?: No Burning/pain with urination?: No Get up at night to urinate?: Yes Leakage of urine?: No Urine stream starts and stops?: No Trouble starting stream?: No Do you have to strain to urinate?: No Blood in urine?: No Urinary tract infection?: No Sexually transmitted disease?: No Injury to kidneys or bladder?: No Painful intercourse?: No Weak stream?: No Erection problems?: Yes Penile pain?: No  Gastrointestinal Nausea?: No Vomiting?: No Indigestion/heartburn?: No Diarrhea?: No Constipation?: No  Constitutional Fever: No Night sweats?: No Weight loss?: No Fatigue?: No  Skin Skin rash/lesions?: No Itching?: No  Eyes Blurred vision?: No Double vision?: No  Ears/Nose/Throat Sore throat?: No Sinus problems?: No  Hematologic/Lymphatic Swollen glands?: No Easy bruising?: No  Cardiovascular Leg swelling?: No Chest pain?: No  Respiratory Cough?: No Shortness of breath?: No  Endocrine Excessive thirst?: No  Musculoskeletal Back pain?: No Joint pain?: No  Neurological Headaches?: No Dizziness?: No  Psychologic Depression?: No Anxiety?: No  Physical Exam: BP 102/55 mmHg  Pulse 66  Ht 6\' 2"  (1.88 m)  Wt 217 lb 3.2 oz (98.521 kg)  BMI 27.87 kg/m2  Constitutional: Well nourished. Alert and oriented, No acute distress. HEENT: [place] AT, moist mucus membranes. Trachea midline, no masses. Cardiovascular: No clubbing, cyanosis, or edema. Respiratory: Normal respiratory effort, no increased work of breathing. Skin: No rashes, bruises or suspicious lesions. Lymph: No cervical or inguinal adenopathy. Neurologic: Grossly intact, no focal deficits, moving all 4 extremities. Psychiatric: Normal mood and affect.  Laboratory Data: Lab Results  Component Value Date   WBC 4.3 03/02/2016   HGB 13.6 03/02/2016    HCT 39.8* 03/02/2016   MCV 95.3 03/02/2016   PLT 209 03/02/2016    Lab Results  Component Value Date   CREATININE 1.66* 03/02/2016     Lab Results  Component Value Date   AST 31 04/11/2014   Lab Results  Component Value Date   ALT 48 04/11/2014    Assessment & Plan:    1.  BPH with LUTS:   IPSS score is 7/2.  He will continue the alfuzosin 10 mg daily.  He will have his IPSS score and exam in 12 months.   2. Nocturia:   Patient is getting up 3 times nightly.  He does not find this bothersome.  He is not interested in pursuing an evaluation for sleep apnea at this time.   Return in about 1 year (around 04/25/2017) for IPSS and exam.  These notes generated with voice recognition software. I apologize for typographical errors.  Zara Council, [facility] [address] [place], [place] [address] [phone]

## 2016-04-28 ENCOUNTER — Other Ambulatory Visit: Payer: Self-pay | Admitting: Internal Medicine

## 2016-04-28 DIAGNOSIS — R05 Cough: Secondary | ICD-10-CM

## 2016-04-28 DIAGNOSIS — R053 Chronic cough: Secondary | ICD-10-CM

## 2016-05-09 ENCOUNTER — Telehealth: Payer: Self-pay | Admitting: Urology

## 2016-05-09 NOTE — Telephone Encounter (Signed)
Patient called today with questions about his medication and possible side effects.  Please give him a call.

## 2016-05-10 NOTE — Telephone Encounter (Signed)
Spoke with pt in reference to medication questions. Pt stated that since starting alfuzosin he has developed dizziness, drowsiness, and just very weak. Please advise.

## 2016-05-11 NOTE — Telephone Encounter (Signed)
I suggest he stop the medication.  If the symptoms continue, he needs to see his PCP.

## 2016-05-12 NOTE — Telephone Encounter (Signed)
Spoke with pt in reference to alfuzosin. Per Larene Beach pt next step is cialis daily. Due to pt being on nitroglycerin pt will need a medical letter of clearance. Made pt aware of Shannons orders. Pt voiced understanding stating he will bring the letter to our office.

## 2016-05-13 ENCOUNTER — Telehealth: Payer: Self-pay | Admitting: *Deleted

## 2016-05-13 DIAGNOSIS — N400 Enlarged prostate without lower urinary tract symptoms: Secondary | ICD-10-CM

## 2016-05-13 NOTE — Telephone Encounter (Signed)
Spoke with patient about the Cialis Larene Beach had mentioned to him. Dr. Nehemiah Massed will not give him a letter of clearance to take this drug because of him taking isosorbide.  Also spoke with nurse at Dr. Alveria Apley office she had called asking for a clearance letter form to fill out. I let her know we didn't have a form and we just ask the patient to let the doctor know they need a clearance letter to take a medication. Patient would like to know what to do next. I let patient know I would contact him back after I spoke with Larene Beach and it would be early next week sometime. Patient states that is ok.

## 2016-05-17 ENCOUNTER — Ambulatory Visit
Admission: RE | Admit: 2016-05-17 | Discharge: 2016-05-17 | Disposition: A | Discharge status: Home / Self Care | Payer: Medicare Other | Source: Ambulatory Visit | Attending: Internal Medicine | Admitting: Internal Medicine

## 2016-05-17 DIAGNOSIS — I700 Atherosclerosis of aorta: Secondary | ICD-10-CM | POA: Diagnosis not present

## 2016-05-17 DIAGNOSIS — Z951 Presence of aortocoronary bypass graft: Secondary | ICD-10-CM | POA: Diagnosis not present

## 2016-05-17 DIAGNOSIS — R053 Chronic cough: Secondary | ICD-10-CM

## 2016-05-17 DIAGNOSIS — R05 Cough: Secondary | ICD-10-CM | POA: Insufficient documentation

## 2016-05-17 DIAGNOSIS — R918 Other nonspecific abnormal finding of lung field: Secondary | ICD-10-CM | POA: Insufficient documentation

## 2016-05-18 NOTE — Telephone Encounter (Signed)
LMOM for patient to call back.

## 2016-05-18 NOTE — Telephone Encounter (Signed)
Line busy

## 2016-05-18 NOTE — Telephone Encounter (Signed)
Per Larene Beach let the patient know that he can stop by and try some samples of Rapaflo (2) because of cardiologist not wanting to write a clearance letter for the cialis for him. Unfortunately this medication is in the same family as the alfuzosin and tamsulosin so it may still cause dizziness and it is a name brand drug so the cost is probably on the expensive side.

## 2016-05-19 NOTE — Telephone Encounter (Signed)
Spoke with patient and talked with him about the Rapaflo. Patient does want to try it and 2 boxes of samples were put up front  for patient. Keep follow up appointment.

## 2016-06-02 NOTE — Telephone Encounter (Signed)
Pt states Rapaflo samples are working & he would like a prescription sent to [facility] on [address] in [place].

## 2016-06-02 NOTE — Telephone Encounter (Signed)
Okay to give script.  Rapaflo 8 mg daily # 30 with 12 refills.

## 2016-06-03 MED ORDER — SILODOSIN 8 MG PO CAPS
8.0000 mg | ORAL_CAPSULE | Freq: Every day | ORAL | Status: DC
Start: 2016-06-03 — End: 2016-06-17

## 2016-06-03 NOTE — Telephone Encounter (Signed)
Medication sent to pharmacy  

## 2016-06-03 NOTE — Addendum Note (Signed)
Addended by: Toniann Fail C on: 06/03/2016 11:14 AM   Modules accepted: Orders

## 2016-06-06 ENCOUNTER — Telehealth: Payer: Self-pay | Admitting: Urology

## 2016-06-06 NOTE — Telephone Encounter (Signed)
ERROR

## 2016-06-17 ENCOUNTER — Other Ambulatory Visit: Payer: Self-pay

## 2016-06-17 DIAGNOSIS — N400 Enlarged prostate without lower urinary tract symptoms: Secondary | ICD-10-CM

## 2016-06-17 MED ORDER — SILODOSIN 8 MG PO CAPS: 8.0000 mg | ORAL_CAPSULE | Freq: Every day | ORAL | Status: DC

## 2016-10-16 ENCOUNTER — Encounter: Payer: Self-pay | Admitting: Emergency Medicine

## 2016-10-16 ENCOUNTER — Emergency Department
Admission: EM | Admit: 2016-10-16 | Discharge: 2016-10-16 | Disposition: A | Discharge status: Home / Self Care | Payer: Medicare Other | Attending: Emergency Medicine | Admitting: Emergency Medicine

## 2016-10-16 ENCOUNTER — Emergency Department: Payer: Medicare Other

## 2016-10-16 DIAGNOSIS — I252 Old myocardial infarction: Secondary | ICD-10-CM | POA: Insufficient documentation

## 2016-10-16 DIAGNOSIS — Z79899 Other long term (current) drug therapy: Secondary | ICD-10-CM | POA: Diagnosis not present

## 2016-10-16 DIAGNOSIS — Z85828 Personal history of other malignant neoplasm of skin: Secondary | ICD-10-CM | POA: Diagnosis not present

## 2016-10-16 DIAGNOSIS — R002 Palpitations: Secondary | ICD-10-CM | POA: Insufficient documentation

## 2016-10-16 DIAGNOSIS — I129 Hypertensive chronic kidney disease with stage 1 through stage 4 chronic kidney disease, or unspecified chronic kidney disease: Secondary | ICD-10-CM | POA: Insufficient documentation

## 2016-10-16 DIAGNOSIS — N189 Chronic kidney disease, unspecified: Secondary | ICD-10-CM | POA: Insufficient documentation

## 2016-10-16 DIAGNOSIS — Z7901 Long term (current) use of anticoagulants: Secondary | ICD-10-CM | POA: Diagnosis not present

## 2016-10-16 DIAGNOSIS — I2510 Atherosclerotic heart disease of native coronary artery without angina pectoris: Secondary | ICD-10-CM | POA: Insufficient documentation

## 2016-10-16 HISTORY — DX: Unspecified atrial fibrillation: I48.91

## 2016-10-16 LAB — BASIC METABOLIC PANEL
Anion gap: 8 (ref 5–15)
BUN: 22 mg/dL — ABNORMAL HIGH (ref 6–20)
CHLORIDE: 107 mmol/L (ref 101–111)
CO2: 26 mmol/L (ref 22–32)
CREATININE: 1.52 mg/dL — AB (ref 0.61–1.24)
Calcium: 9.1 mg/dL (ref 8.9–10.3)
GFR calc non Af Amer: 43 mL/min — ABNORMAL LOW (ref >60)
GFR, EST AFRICAN AMERICAN: 50 mL/min — AB (ref >60)
GLUCOSE: 98 mg/dL (ref 65–99)
Potassium: 3.9 mmol/L (ref 3.5–5.1)
Sodium: 141 mmol/L (ref 135–145)

## 2016-10-16 LAB — CBC
HCT: 41.3 % (ref 40.0–52.0)
HEMOGLOBIN: 14.5 g/dL (ref 13.0–18.0)
MCH: 33.4 pg (ref 26.0–34.0)
MCHC: 35.1 g/dL (ref 32.0–36.0)
MCV: 95.1 fL (ref 80.0–100.0)
Platelets: 192 10*3/uL (ref 150–440)
RBC: 4.34 MIL/uL — AB (ref 4.40–5.90)
RDW: 13.2 % (ref 11.5–14.5)
WBC: 4.7 10*3/uL (ref 3.8–10.6)

## 2016-10-16 LAB — APTT: aPTT: 29 seconds (ref 24–36)

## 2016-10-16 LAB — PROTIME-INR
INR: 1.67
Prothrombin Time: 19.9 seconds — ABNORMAL HIGH (ref 11.4–15.2)

## 2016-10-16 MED ORDER — METOPROLOL TARTRATE 25 MG PO TABS: 25.0000 mg | ORAL_TABLET | ORAL | 0 refills | Status: DC | PRN

## 2016-10-16 NOTE — ED Provider Notes (Addendum)
[facility] Emergency Department Provider Note   ____________________________________________   First MD Initiated Contact with Patient 10/16/16 1012     (approximate)  I have reviewed the triage vital signs and the nursing notes.   HISTORY  Chief Complaint Palpitations   HPI Robert Rivas is a 75 y.o. male with a history of atrial fibrillation on Coumadin as well as amiodarone and amlodipine who is presenting with palpitations with a past 2 days. He says that they start soon after he wakes up and last for about 1 hour. He denies any chest pain or shortness of breath. Says that they have resolved spontaneously. Said he called Dr. Nehemiah Massed yesterday who recommended increasing his dose of amiodarone and if this does not help with the symptoms to come into the emergency department. Patient says that the palpitations stop spontaneously and has no symptoms at this time.   Past Medical History:  Diagnosis Date  . A-fib ([place])   . BPH (benign prostatic hyperplasia)   . Cancer ([place])    Skin Cancer  . Chronic kidney disease    Chronic Kidney Disease  . Coronary artery disease   . Dysrhythmia    Atrial Fibrillation; Supraventricular Tachycardia  . Gout   . Hyperlipidemia   . Hypertension   . MI, old   . Nocturia   . Phlebitis     Patient Active Problem List   Diagnosis Date Noted  . BPH with obstruction/lower urinary tract symptoms 04/25/2016  . Nocturia 04/25/2016  . Gout 03/25/2016  . History of colon polyps 03/25/2016  . Inflammation of a vein 03/25/2016  . Percutaneous transluminal coronary angioplasty status 03/25/2016  . Supraventricular tachycardia ([place]) 03/25/2016  . Benign essential HTN 06/10/2015  . Paroxysmal atrial fibrillation ([place]) 01/20/2015  . Atrial fibrillation ([place]) 05/13/2014  . 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 Chest pain 05/13/2014  . Chronic kidney disease 05/13/2014  . Essential (primary)  hypertension 05/13/2014  . History of cardiac catheterization 05/13/2014  . HLD (hyperlipidemia) 05/13/2014  . Combined fat and carbohydrate induced hyperlipemia 05/13/2014  . History of cardiovascular surgery 05/13/2014  . History of anticoagulant therapy 04/28/2014  . Long term current use of anticoagulant 04/28/2014  . Benign prostatic hyperplasia with urinary obstruction 11/05/2013    Past Surgical History:  Procedure Laterality Date  . cardaic stents    . CARDIAC CATHETERIZATION     PTCA with Stent Placement  . COLONOSCOPY WITH PROPOFOL N/A 12/15/2015   Procedure: COLONOSCOPY WITH PROPOFOL;  Surgeon: Lollie Sails, MD;  Location: [facility] ENDOSCOPY;  Service: Endoscopy;  Laterality: N/A;  . CORONARY ARTERY BYPASS GRAFT    . HERNIA REPAIR      Prior to Admission medications   Medication Sig Start Date End Date Taking? Authorizing Provider  amiodarone (PACERONE) 100 MG tablet Take 100 mg by mouth daily.   Yes Historical Provider, MD  amLODipine (NORVASC) 10 MG tablet Take 10 mg by mouth daily.   Yes Historical Provider, MD  benzonatate (TESSALON) 100 MG capsule Take 100 mg by mouth 3 (three) times daily as needed for cough.   Yes Historical Provider, MD  cyanocobalamin (,VITAMIN B-12,) 1000 MCG/ML injection Inject 1,000 mcg into the muscle every 30 (thirty) days.   Yes Historical Provider, MD  docusate sodium (COLACE) 100 MG capsule Take 100 mg by mouth daily.   Yes Historical Provider, MD  ezetimibe (ZETIA) 10 MG tablet Take 10 mg by mouth daily.   Yes Historical  Provider, MD  isosorbide dinitrate (ISORDIL) 10 MG tablet Take 30 mg by mouth 2 (two) times daily.   Yes Historical Provider, MD  nabumetone (RELAFEN) 500 MG tablet Take 500 mg by mouth 2 (two) times daily as needed for moderate pain.    Yes Historical Provider, MD  probenecid (BENEMID) 500 MG tablet Take 500 mg by mouth daily.   Yes Historical Provider, MD  simvastatin (ZOCOR) 20 MG tablet Take 20 mg by mouth daily.    Yes Historical Provider, MD  alfuzosin (UROXATRAL) 10 MG 24 hr tablet Take 1 tablet (10 mg total) by mouth daily with breakfast. Patient not taking: Reported on 10/16/2016 04/25/16   Larene Beach A McGowan, PA-C  chlorpheniramine-HYDROcodone (TUSSIONEX PENNKINETIC ER) 10-8 MG/5ML SUER Take 5 mLs by mouth.    Historical Provider, MD  silodosin (RAPAFLO) 8 MG CAPS capsule Take 1 capsule (8 mg total) by mouth daily with breakfast. 06/17/16   Nori Riis, PA-C  warfarin (COUMADIN) 2 MG tablet Take 4 mg by mouth 2 (two) times daily. (1 1/2 tablet QD)    Historical Provider, MD    Allergies Codeine  Family History  Problem Relation Age of Onset  . Kidney cancer Maternal Uncle   . Prostate cancer Neg Hx     Social History Social History  Substance Use Topics  . Smoking status: Never Smoker  . Smokeless tobacco: Never Used  . Alcohol use No    Review of Systems Constitutional: No fever/chills Eyes: No visual changes. ENT: No sore throat. Cardiovascular: Denies chest pain. Respiratory: Denies shortness of breath. Gastrointestinal: No abdominal pain.  No nausea, no vomiting.  No diarrhea.  No constipation. Genitourinary: Negative for dysuria. Musculoskeletal: Negative for back pain. Skin: Negative for rash. Neurological: Negative for headaches, focal weakness or numbness.  10-point ROS otherwise negative.  ____________________________________________   PHYSICAL EXAM:  VITAL SIGNS: ED Triage Vitals  Enc Vitals Group     BP 10/16/16 0918 130/78     Pulse Rate 10/16/16 0918 74     Resp 10/16/16 0918 18     Temp 10/16/16 0918 98 F (36.7 C)     Temp Source 10/16/16 0918 Oral     SpO2 10/16/16 0918 97 %     Weight 10/16/16 0919 212 lb (96.2 kg)     Height 10/16/16 0919 6\' 2"  (1.88 m)     Head Circumference --      Peak Flow --      Pain Score 10/16/16 0922 0     Pain Loc --      Pain Edu? --      Excl. in [place]? --     Constitutional: Alert and oriented. Well appearing and  in no acute distress. Eyes: Conjunctivae are normal. PERRL. EOMI. Head: Atraumatic. Nose: No congestion/rhinnorhea. Mouth/Throat: Mucous membranes are moist.   Neck: No stridor.   Cardiovascular: Normal rate, regular rhythm. Grossly normal heart sounds.   Respiratory: Normal respiratory effort.  No retractions. Lungs CTAB. Gastrointestinal: Soft and nontender. No distention.  Musculoskeletal: No lower extremity tenderness nor edema.  No joint effusions. Neurologic:  Normal speech and language. No gross focal neurologic deficits are appreciated. No gait instability. Skin:  Skin is warm, dry and intact. No rash noted. Psychiatric: Mood and affect are normal. Speech and behavior are normal.  ____________________________________________   LABS (all labs ordered are listed, but only abnormal results are displayed)  Labs Reviewed  BASIC METABOLIC PANEL - Abnormal; Notable for the following:  Result Value   BUN 22 (*)    Creatinine, Ser 1.52 (*)    GFR calc non Af Amer 43 (*)    GFR calc Af Amer 50 (*)    All other components within normal limits  CBC - Abnormal; Notable for the following:    RBC 4.34 (*)    All other components within normal limits  PROTIME-INR - Abnormal; Notable for the following:    Prothrombin Time 19.9 (*)    All other components within normal limits  APTT   ____________________________________________  EKG  ED ECG REPORT I, Fareed Fung,  Youlanda Roys, the attending physician, personally viewed and interpreted this ECG.   Date: 10/16/2016  EKG Time: 0914  Rate: 70  Rhythm: normal sinus rhythm  Axis: Normal  Intervals:none  ST&T Change: T wave inversions in 3 as well as aVF. Similar appearance to multiple previous EKGs on the record.  ____________________________________________  RADIOLOGY  DG Chest 2 View (Accession [id number]) (Order [id number])  Imaging  Date: 10/16/2016 Department: [place] Released  By: Kerney Elbe, RN (auto-released) Authorizing: Orbie Pyo, MD  Exam Information   Status Exam Begun  Exam Ended   Final [99] 10/16/2016 10:07 AM 10/16/2016 10:08 AM  PACS Images   Show images for DG Chest 2 View  Study Result   CLINICAL DATA:  Previous coronary bypass, atrial fibrillation, palpitations  EXAM: CHEST  2 VIEW  COMPARISON:  05/17/2016  FINDINGS: Remote coronary bypass changes. Mild cardiomegaly without CHF, focal pneumonia, collapse, or consolidation. No edema, effusion or pneumothorax. Trachea is midline. Atherosclerosis of the thoracic aorta. Minor thoracic spondylosis.  IMPRESSION: Remote coronary bypass.  Mild cardiomegaly without acute chest process  Thoracic aortic atherosclerosis   Electronically Signed   By: Jerilynn Mages.  Shick M.D.   On: 10/16/2016 10:13     ____________________________________________   PROCEDURES  Procedure(s) performed:   Procedures  Critical Care performed:   ____________________________________________   INITIAL IMPRESSION / ASSESSMENT AND PLAN / ED COURSE  Pertinent labs & imaging results that were available during my care of the patient were reviewed by me and considered in my medical decision making (see chart for details).    Clinical Course   ----------------------------------------- 11:18 AM on 10/16/2016 -----------------------------------------  Patient continues in the emergency department to be a symptomatic. I discussed the case with his cardiologist, Dr. Nehemiah Massed. Dr. Nehemiah Massed recommends an as needed dose of 25 mg of metoprolol tartrate. He recommends 1 dose initially as needed for palpitations and a second dose and 30 minutes. At that point if the patient is still having symptoms that he should call either the cardiologist office or go to the hospital. I discussed this plan with the patient as well as his wife worsening of this plan and willing to comply. They'll be discharged home.  Likely paroxysmal A. fib causing the patient's symptoms.  ____________________________________________   FINAL CLINICAL IMPRESSION(S) / ED DIAGNOSES  Palpitations.    NEW MEDICATIONS STARTED DURING THIS VISIT:  New Prescriptions   No medications on file     Note:  This document was prepared using Dragon voice recognition software and may include unintentional dictation errors.    Orbie Pyo, MD 10/16/16 1119  Discussed with Dr. Nehemiah Massed and the patient is to remain on 100 mg of his amiodarone.  Patient and wife also aware of this plan.    Orbie Pyo, MD 10/16/16 [phone]

## 2016-10-16 NOTE — ED Triage Notes (Signed)
Heart rate elevated this morning.  Report heart rate was 152 this morning.  Denies Chest Pain or SOB.  Patient has history of afib.  Patient reports similar episode yesterday.  Patient has history of cardioversion January 2015.

## 2016-10-16 NOTE — ED Triage Notes (Signed)
First Nurse: Patient ambulatory to triage with c/o "high hear rate". Patient sent to TRIAGE 3 for STAT ECG

## 2017-02-21 DIAGNOSIS — Z87438 Personal history of other diseases of male genital organs: Secondary | ICD-10-CM | POA: Insufficient documentation

## 2017-02-22 HISTORY — PX: SVT ABLATION: EP1225

## 2017-04-24 NOTE — Progress Notes (Signed)
04/25/2017 2:17 PM   Robert Rivas [birth year] [id number]  Referring provider: Idelle Crouch, MD [facility] [place], [place] [address]  Chief Complaint  Patient presents with  . Benign Prostatic Hypertrophy    1 year follow up   . Nocturia    HPI: Patient is a 76 year old Caucasian male with a history of nocturia and BPH with LUTS and nocturia who presents today for a one year follow up.    Nocturia Patient states that prior to initiating alfuzosin 10 mg daily, he was getting up 4-5 times nightly to urinate. He is now down to 2-3 times nightly, but he discontinued the medication due to dizziness.  He was then switched to Rapaflo.  He is satisfied with these results. He denied any history of sleep apnea.  BPH WITH LUTS His IPSS score today is 14, which is moderate lower urinary tract symptomatology. He is mostly satisfied with his quality life due to his urinary symptoms.  His previous IPSS score was 7/2.   His previous PVR is 96 mL.   His major complaint is nocturia x 5.   He denies any dysuria, hematuria or suprapubic pain.   He also denies any recent fevers, chills, nausea or vomiting.        IPSS    Row Name 04/25/17 1300         International Prostate Symptom Score   How often have you had the sensation of not emptying your bladder? Less than half the time     How often have you had to urinate less than every two hours? Less than half the time     How often have you found you stopped and started again several times when you urinated? Less than half the time     How often have you found it difficult to postpone urination? Less than 1 in 5 times     How often have you had a weak urinary stream? Less than 1 in 5 times     How often have you had to strain to start urination? Less than half the time     How many times did you typically get up at night to urinate? 4 Times     Total IPSS Score 14       Quality of Life due to urinary symptoms   If you were to spend the rest of your life with your urinary condition just the way it is now how would you feel about that? Mostly Disatisfied        Score:  1-7 Mild 8-19 Moderate 20-35 Severe   PMH: Past Medical History:  Diagnosis Date  . A-fib ([place])   . BPH (benign prostatic hyperplasia)   . Cancer ([place])    Skin Cancer  . Chronic kidney disease    Chronic Kidney Disease  . Coronary artery disease   . Dysrhythmia    Atrial Fibrillation; Supraventricular Tachycardia  . Gout   . Hyperlipidemia   . Hypertension   . MI, old   . Nocturia   . Phlebitis     Surgical History: Past Surgical History:  Procedure Laterality Date  . cardaic stents    . CARDIAC CATHETERIZATION     PTCA with Stent Placement  . COLONOSCOPY WITH PROPOFOL N/A 12/15/2015   Procedure: COLONOSCOPY WITH PROPOFOL;  Surgeon: Lollie Sails, MD;  Location: [facility] ENDOSCOPY;  Service: Endoscopy;  Laterality: N/A;  . CORONARY ARTERY BYPASS GRAFT    .  HERNIA REPAIR      Home Medications:  Allergies as of 04/25/2017      Reactions   Codeine Nausea And Vomiting, Nausea Only      Medication List       Accurate as of 04/25/17  2:17 PM. Always use your most recent med list.          alfuzosin 10 MG 24 hr tablet Commonly known as:  UROXATRAL Take 1 tablet (10 mg total) by mouth daily with breakfast.   amiodarone 100 MG tablet Commonly known as:  PACERONE Take 100 mg by mouth daily.   amLODipine 10 MG tablet Commonly known as:  NORVASC Take 10 mg by mouth daily.   BD INTEGRA SYRINGE 25G X 1" 3 ML Misc Generic drug:  SYRINGE-NEEDLE (DISP) 3 ML Use 1 Syringe monthly.   benzonatate 100 MG capsule Commonly known as:  TESSALON Take 100 mg by mouth 3 (three) times daily as needed for cough.   cyanocobalamin 1000 MCG/ML injection Commonly known as:  (VITAMIN B-12) Inject 1,000 mcg into the muscle every 30 (thirty) days.   cyclobenzaprine 10 MG tablet Commonly known as:  FLEXERIL Take by  mouth.   docusate sodium 100 MG capsule Commonly known as:  COLACE Take 100 mg by mouth daily.   ezetimibe 10 MG tablet Commonly known as:  ZETIA Take 10 mg by mouth at bedtime.   isosorbide dinitrate 10 MG tablet Commonly known as:  ISORDIL Take 30 mg by mouth 2 (two) times daily.   losartan 25 MG tablet Commonly known as:  COZAAR Take by mouth.   metoprolol tartrate 25 MG tablet Commonly known as:  LOPRESSOR Take 1 tablet (25 mg total) by mouth every 30 (thirty) minutes as needed (palpitations/rapid heart rate). If you have taken 2 doses and are still having palpitations/symptoms.  Call your cardiologist or go immediately to the emergency department.   montelukast 10 MG tablet Commonly known as:  SINGULAIR Take by mouth.   mupirocin ointment 2 % Commonly known as:  BACTROBAN Apply topically.   nabumetone 500 MG tablet Commonly known as:  RELAFEN Take 500 mg by mouth 2 (two) times daily as needed for moderate pain.   nitroGLYCERIN 0.4 MG SL tablet Commonly known as:  NITROSTAT   predniSONE 10 MG tablet Commonly known as:  DELTASONE Taper 6-6-4-4-2-2-1-1-off   probenecid 500 MG tablet Commonly known as:  BENEMID Take 500 mg by mouth daily.   silodosin 8 MG Caps capsule Commonly known as:  RAPAFLO Take 1 capsule (8 mg total) by mouth daily with breakfast.   simvastatin 20 MG tablet Commonly known as:  ZOCOR Take 20 mg by mouth at bedtime.   TUSSIONEX PENNKINETIC ER 10-8 MG/5ML Suer Generic drug:  chlorpheniramine-HYDROcodone Take 5 mLs by mouth every 12 (twelve) hours as needed for cough.   warfarin 2 MG tablet Commonly known as:  COUMADIN Take 3 mg by mouth at bedtime.       Allergies:  Allergies  Allergen Reactions  . Codeine Nausea And Vomiting and Nausea Only    Family History: Family History  Problem Relation Age of Onset  . Kidney cancer Maternal Uncle   . Prostate cancer Neg Hx   . Bladder Cancer Neg Hx     Social History:  reports  that he has never smoked. He has never used smokeless tobacco. He reports that he does not drink alcohol or use drugs.  ROS: UROLOGY Frequent Urination?: No Hard to postpone urination?: No Burning/pain  with urination?: No Get up at night to urinate?: Yes Leakage of urine?: No Urine stream starts and stops?: No Trouble starting stream?: No Do you have to strain to urinate?: No Blood in urine?: No Urinary tract infection?: No Sexually transmitted disease?: No Injury to kidneys or bladder?: No Painful intercourse?: No Weak stream?: No Erection problems?: No Penile pain?: No  Gastrointestinal Nausea?: No Vomiting?: No Indigestion/heartburn?: No Diarrhea?: No Constipation?: No  Constitutional Fever: No Night sweats?: No Weight loss?: No Fatigue?: No  Skin Skin rash/lesions?: No Itching?: No  Eyes Blurred vision?: No Double vision?: No  Ears/Nose/Throat Sore throat?: No Sinus problems?: No  Hematologic/Lymphatic Swollen glands?: No Easy bruising?: No  Cardiovascular Leg swelling?: No Chest pain?: No  Respiratory Cough?: No Shortness of breath?: No  Endocrine Excessive thirst?: No  Musculoskeletal Back pain?: No Joint pain?: No  Neurological Headaches?: No Dizziness?: No  Psychologic Depression?: No Anxiety?: No  Physical Exam: BP 107/61   Pulse 65   Ht 6\' 1"  (1.854 m)   Wt 221 lb 3.2 oz (100.3 kg)   BMI 29.18 kg/m   Constitutional: Well nourished. Alert and oriented, No acute distress. HEENT: [place] AT, moist mucus membranes. Trachea midline, no masses. Cardiovascular: No clubbing, cyanosis, or edema. Respiratory: Normal respiratory effort, no increased work of breathing. GI: Abdomen is soft, non tender, non distended, no abdominal masses. Liver and spleen not palpable.  No hernias appreciated.  Stool sample for occult testing is not indicated.   GU: No CVA tenderness.  No bladder fullness or masses.  Patient with uncircumcised phallus.  Foreskin not easily retracted  Urethral meatus is patent.  No penile discharge. No penile lesions or rashes. Scrotum without lesions, cysts, rashes and/or edema.  Testicles are located scrotally bilaterally. No masses are appreciated in the testicles. Left and right epididymis are normal. Rectal: Patient with  normal sphincter tone. Anus and perineum without scarring or rashes. No rectal masses are appreciated. Prostate is approximately 50  grams, no nodules are appreciated. Seminal vesicles are normal. Skin: No rashes, bruises or suspicious lesions. Lymph: No cervical or inguinal adenopathy. Neurologic: Grossly intact, no focal deficits, moving all 4 extremities. Psychiatric: Normal mood and affect.  Laboratory Data: Lab Results  Component Value Date   WBC 4.7 10/16/2016   HGB 14.5 10/16/2016   HCT 41.3 10/16/2016   MCV 95.1 10/16/2016   PLT 192 10/16/2016    Lab Results  Component Value Date   CREATININE 1.52 (H) 10/16/2016     Lab Results  Component Value Date   AST 31 04/11/2014   Lab Results  Component Value Date   ALT 48 04/11/2014    Assessment & Plan:    1.  BPH with LUTS  - IPSS score is 14/4, it is worsening  - Continue conservative management, avoiding bladder irritants and timed voiding's  - most bothersome symptoms is/are nocturia x 5  - Continue Rapaflo 8 mg daily  - RTC in 12 months for IPSS, PVR and exam   2. Nocturia  - I explained to the patient that nocturia is often multi-factorial and difficult to treat.  Sleeping disorders, heart conditions, peripheral vascular disease, diabetes, an enlarged prostate for men, an urethral stricture causing bladder outlet obstruction and/or certain medications can contribute to nocturia.  - I have suggested that the patient avoid caffeine after noon and alcohol in the evening.  He or she may also benefit from fluid restrictions after 6:00 in the evening and voiding just prior to bedtime.  -  I have explained that  research studies have showed that over 84% of patients with sleep apnea reported frequent nighttime urination.   With sleep apnea, oxygen decreases, carbon dioxide increases, the blood become more acidic, the heart rate drops and blood vessels in the lung constrict.  The body is then alerted that something is very wrong. The sleeper must wake enough to reopen the airway. By this time, the heart is racing and experiences a false signal of fluid overload. The heart excretes a hormone-like protein that tells the body to get rid of sodium and water, resulting in nocturia.  -  I also informed the patient that a recent study noted that decreasing sodium intake to 2.3 grams daily, if they Rivas't have issues with hyponatremia, can also reduce the number of nightly voids  - The patient may benefit from a discussion with his or her primary care physician to see if he or she has risk factors for sleep apnea or other sleep disturbances and obtaining a sleep study.   Return in about 1 year (around 04/25/2018) for IPSS and PVR.  These notes generated with voice recognition software. I apologize for typographical errors.  Zara Council, [facility] [address] [place], [place] [address] [phone]

## 2017-04-25 ENCOUNTER — Ambulatory Visit (INDEPENDENT_AMBULATORY_CARE_PROVIDER_SITE_OTHER): Payer: Medicare Other | Admitting: Urology

## 2017-04-25 ENCOUNTER — Encounter: Payer: Self-pay | Admitting: Urology

## 2017-04-25 VITALS — BP 107/61 | HR 65 | Ht 73.0 in | Wt 221.2 lb

## 2017-04-25 DIAGNOSIS — N138 Other obstructive and reflux uropathy: Secondary | ICD-10-CM | POA: Diagnosis not present

## 2017-04-25 DIAGNOSIS — R351 Nocturia: Secondary | ICD-10-CM | POA: Diagnosis not present

## 2017-04-25 DIAGNOSIS — N401 Enlarged prostate with lower urinary tract symptoms: Secondary | ICD-10-CM

## 2017-04-25 DIAGNOSIS — N400 Enlarged prostate without lower urinary tract symptoms: Secondary | ICD-10-CM | POA: Diagnosis not present

## 2017-04-25 LAB — BLADDER SCAN AMB NON-IMAGING: SCAN RESULT: 29

## 2017-06-01 ENCOUNTER — Other Ambulatory Visit: Payer: Self-pay | Admitting: Internal Medicine

## 2017-06-01 DIAGNOSIS — M7989 Other specified soft tissue disorders: Secondary | ICD-10-CM

## 2017-06-08 ENCOUNTER — Ambulatory Visit
Admission: RE | Admit: 2017-06-08 | Discharge: 2017-06-08 | Disposition: A | Discharge status: Home / Self Care | Payer: Medicare Other | Source: Ambulatory Visit | Attending: Internal Medicine | Admitting: Internal Medicine

## 2017-06-08 DIAGNOSIS — M7989 Other specified soft tissue disorders: Secondary | ICD-10-CM | POA: Diagnosis not present

## 2017-06-08 DIAGNOSIS — R590 Localized enlarged lymph nodes: Secondary | ICD-10-CM | POA: Diagnosis not present

## 2017-09-20 DIAGNOSIS — R600 Localized edema: Secondary | ICD-10-CM | POA: Insufficient documentation

## 2017-09-28 ENCOUNTER — Ambulatory Visit: Admit: 2017-09-28 | Payer: Medicare Other | Admitting: Ophthalmology

## 2017-09-28 SURGERY — PHACOEMULSIFICATION, CATARACT, WITH IOL INSERTION
Anesthesia: Choice | Laterality: Right

## 2017-12-05 ENCOUNTER — Other Ambulatory Visit: Payer: Self-pay

## 2017-12-05 ENCOUNTER — Encounter: Payer: Self-pay | Admitting: *Deleted

## 2017-12-07 NOTE — Discharge Instructions (Signed)
INSTRUCTIONS FOLLOWING OCULOPLASTIC SURGERY °AMY M. FOWLER, MD ° °AFTER YOUR EYE SURGERY, THER ARE MANY THINGS THWIHC YOU, THE PATIENT, CAN DO TO ASSURE THE BEST POSSIBLE RESULT FROM YOUR OPERATION.  THIS SHEET SHOULD BE REFERRED TO WHENEVER QUESTIONS ARISE.  IF THERE ARE ANY QUESTIONS NOT ANSWERED HERE, DO NOT HESITATE TO CALL OUR OFFICE AT [phone] OR [phone].  THERE IS ALWAYS [facility] AVAILABLE TO CALL IF QUESTIONS OR PROBLEMS ARISE. ° °VISION: Your vision may be blurred and out of focus after surgery until you are able to stop using your ointment, swelling resolves and your eye(s) heal. This may take 1 to 2 weeks at the least.  If your vision becomes gradually more dim or dark, this is not normal and you need to call our office immediately. ° °EYE CARE: For the first 48 hours after surgery, use ice packs frequently - “20 minutes on, 20 minutes off” - to help reduce swelling and bruising.  Small bags of frozen peas or corn make good ice packs along with cloths soaked in ice water.  If you are wearing a patch or other type of dressing following surgery, keep this on for the amount of time specified by your doctor.  For the first week following surgery, you will need to treat your stitches with great care.  If is OK to shower, but take care to not allow soapy water to run into your eye(s) to help reduce changes of infection.  You may gently clean the eyelashes and around the eye(s) with cotton balls and sterile water, BUT DO NOT RUB THE STITCHES VIGOROUSLY.  Keeping your stitches moist with ointment will help promote healing with minimal scar formation. ° °ACTIVITY: When you leave the surgery center, you should go home, rest and be inactive.  The eye(s) may feel scratchy and keeping the eyes closed will allow for faster healing.  The first week following surgery, avoid straining (anything making the face turn red) or lifting over 20 pounds.  Additionally, avoid bending which causes your head to go below  your waist.  Using your eyes will NOT harm them, so feel free to read, watch television, use the computer, etc as desired.  Driving depends on each individual, so check with your doctor if you have questions about driving. ° °MEDICATIONS:  You will be given a prescription for an ointment to use 4 times a day on your stitches.  You can use the ointment in your eyes if they feel scratchy or irritated.  If you eyelid(s) don’t close completely when you sleep, put some ointment in your eyes before bedtime. ° °EMERGENCY: If you experience SEVERE EYE PAIN OR HEADACHE UNRELIEVED BY TYLENOL OR PERCOCET, NAUSEA OR VOMITING, WORSENING REDNESS, OR WORSENING VISION (ESPECIALLY VISION THAT WA INITIALLY BETTER) CALL [phone] OR [phone] DURING BUSINESS HOURS OR AFTER HOURS. ° °General Anesthesia, Adult, Care After °These instructions provide you with information about caring for yourself after your procedure. Your health care provider may also give you more specific instructions. Your treatment has been planned according to current medical practices, but problems sometimes occur. Call your health care provider if you have any problems or questions after your procedure. °What can I expect after the procedure? °After the procedure, it is common to have: °· Vomiting. °· A sore throat. °· Mental slowness. ° °It is common to feel: °· Nauseous. °· Cold or shivery. °· Sleepy. °· Tired. °· Sore or achy, even in parts of your body where you did not have surgery. ° °  Follow these instructions at home: °For at least 24 hours after the procedure: °· Do not: °? Participate in activities where you could fall or become injured. °? Drive. °? Use heavy machinery. °? Drink alcohol. °? Take sleeping pills or medicines that cause drowsiness. °? Make important decisions or sign legal documents. °? Take care of children on your own. °· Rest. °Eating and drinking °· If you vomit, drink water, juice, or soup when you can drink without  vomiting. °· Drink enough fluid to keep your urine clear or pale yellow. °· Make sure you have little or no nausea before eating solid foods. °· Follow the diet recommended by your health care provider. °General instructions °· Have a responsible adult stay with you until you are awake and alert. °· Return to your normal activities as told by your health care provider. Ask your health care provider what activities are safe for you. °· Take over-the-counter and prescription medicines only as told by your health care provider. °· If you smoke, do not smoke without supervision. °· Keep all follow-up visits as told by your health care provider. This is important. °Contact a health care provider if: °· You continue to have nausea or vomiting at home, and medicines are not helpful. °· You cannot drink fluids or start eating again. °· You cannot urinate after 8-12 hours. °· You develop a skin rash. °· You have fever. °· You have increasing redness at the site of your procedure. °Get help right away if: °· You have difficulty breathing. °· You have chest pain. °· You have unexpected bleeding. °· You feel that you are having a life-threatening or urgent problem. °This information is not intended to replace advice given to you by your health care provider. Make sure you discuss any questions you have with your health care provider. °Document Released: 03/20/2001 Document Revised: 05/16/2016 Document Reviewed: 11/26/2015 °Elsevier Interactive Patient Education © 2018 [employer] ° °

## 2017-12-12 ENCOUNTER — Ambulatory Visit
Admission: RE | Admit: 2017-12-12 | Discharge: 2017-12-12 | Disposition: A | Discharge status: Home / Self Care | Payer: Medicare Other | Source: Ambulatory Visit | Attending: Ophthalmology | Admitting: Ophthalmology

## 2017-12-12 ENCOUNTER — Ambulatory Visit: Payer: Medicare Other | Admitting: Anesthesiology

## 2017-12-12 ENCOUNTER — Encounter
Admission: RE | Disposition: A | Discharge status: Home / Self Care | Payer: Self-pay | Source: Ambulatory Visit | Attending: Ophthalmology

## 2017-12-12 DIAGNOSIS — M109 Gout, unspecified: Secondary | ICD-10-CM | POA: Insufficient documentation

## 2017-12-12 DIAGNOSIS — E7800 Pure hypercholesterolemia, unspecified: Secondary | ICD-10-CM | POA: Diagnosis not present

## 2017-12-12 DIAGNOSIS — Z79899 Other long term (current) drug therapy: Secondary | ICD-10-CM | POA: Insufficient documentation

## 2017-12-12 DIAGNOSIS — Z951 Presence of aortocoronary bypass graft: Secondary | ICD-10-CM | POA: Diagnosis not present

## 2017-12-12 DIAGNOSIS — Z7901 Long term (current) use of anticoagulants: Secondary | ICD-10-CM | POA: Diagnosis not present

## 2017-12-12 DIAGNOSIS — H02042 Spastic entropion of right lower eyelid: Secondary | ICD-10-CM | POA: Diagnosis not present

## 2017-12-12 DIAGNOSIS — I2510 Atherosclerotic heart disease of native coronary artery without angina pectoris: Secondary | ICD-10-CM | POA: Diagnosis not present

## 2017-12-12 DIAGNOSIS — L574 Cutis laxa senilis: Secondary | ICD-10-CM | POA: Diagnosis not present

## 2017-12-12 DIAGNOSIS — I10 Essential (primary) hypertension: Secondary | ICD-10-CM | POA: Diagnosis not present

## 2017-12-12 DIAGNOSIS — I4891 Unspecified atrial fibrillation: Secondary | ICD-10-CM | POA: Diagnosis not present

## 2017-12-12 HISTORY — DX: Presence of dental prosthetic device (complete) (partial): Z97.2

## 2017-12-12 HISTORY — DX: Unspecified osteoarthritis, unspecified site: M19.90

## 2017-12-12 HISTORY — DX: Presence of external hearing-aid: Z97.4

## 2017-12-12 HISTORY — PX: ECTROPION REPAIR: SHX357

## 2017-12-12 SURGERY — REPAIR, ECTROPION, EYELID
Anesthesia: Monitor Anesthesia Care | Site: Face | Laterality: Right | Wound class: Clean

## 2017-12-12 MED ORDER — ALFENTANIL 500 MCG/ML IJ INJ
INJECTION | INTRAVENOUS | Status: DC | PRN
  Administered 2017-12-12: 200 ug via INTRAVENOUS
  Administered 2017-12-12: 500 ug via INTRAVENOUS
  Administered 2017-12-12: 300 ug via INTRAVENOUS

## 2017-12-12 MED ORDER — TRAMADOL HCL 50 MG PO TABS: ORAL_TABLET | ORAL | 0 refills | Status: DC

## 2017-12-12 MED ORDER — PROPOFOL 500 MG/50ML IV EMUL
INTRAVENOUS | Status: DC | PRN
  Administered 2017-12-12: 25 ug/kg/min via INTRAVENOUS

## 2017-12-12 MED ORDER — ERYTHROMYCIN 5 MG/GM OP OINT: TOPICAL_OINTMENT | OPHTHALMIC | 3 refills | Status: DC

## 2017-12-12 MED ORDER — MIDAZOLAM HCL 2 MG/2ML IJ SOLN
INTRAMUSCULAR | Status: DC | PRN
  Administered 2017-12-12 (×2): 1 mg via INTRAVENOUS

## 2017-12-12 MED ORDER — LACTATED RINGERS IV SOLN
INTRAVENOUS | Status: DC
  Administered 2017-12-12: 10:00:00 via INTRAVENOUS

## 2017-12-12 MED ORDER — LIDOCAINE-EPINEPHRINE 2 %-1:100000 IJ SOLN
INTRAMUSCULAR | Status: DC | PRN
  Administered 2017-12-12: 3 mL via OPHTHALMIC

## 2017-12-12 MED ORDER — TETRACAINE HCL 0.5 % OP SOLN
OPHTHALMIC | Status: DC | PRN
  Administered 2017-12-12: 2 [drp] via OPHTHALMIC

## 2017-12-12 MED ORDER — BSS IO SOLN
INTRAOCULAR | Status: DC | PRN
  Administered 2017-12-12: 15 mL

## 2017-12-12 MED ORDER — ERYTHROMYCIN 5 MG/GM OP OINT
TOPICAL_OINTMENT | OPHTHALMIC | Status: DC | PRN
  Administered 2017-12-12: 1 via OPHTHALMIC

## 2017-12-12 SURGICAL SUPPLY — 25 items
APPLICATOR COTTON TIP WD 3 STR (MISCELLANEOUS) ×6 IMPLANT
BLADE SURG 15 STRL LF DISP TIS (BLADE) ×1 IMPLANT
BLADE SURG 15 STRL SS (BLADE) ×2
CORD BIP STRL DISP 12FT (MISCELLANEOUS) ×3 IMPLANT
DRAPE HEAD BAR (DRAPES) ×3 IMPLANT
GAUZE SPONGE 4X4 12PLY STRL (GAUZE/BANDAGES/DRESSINGS) ×3 IMPLANT
GAUZE SPONGE NON-WVN 2X2 STRL (MISCELLANEOUS) ×10 IMPLANT
GLOVE SURG LX 7.0 MICRO (GLOVE) ×4
GLOVE SURG LX STRL 7.0 MICRO (GLOVE) ×2 IMPLANT
MARKER SKIN XFINE TIP W/RULER (MISCELLANEOUS) ×3 IMPLANT
NEEDLE FILTER BLUNT 18X 1/2SAF (NEEDLE) ×2
NEEDLE FILTER BLUNT 18X1 1/2 (NEEDLE) ×1 IMPLANT
NEEDLE HYPO 30X.5 LL (NEEDLE) ×6 IMPLANT
PACK DRAPE NASAL/ENT (PACKS) ×3 IMPLANT
SOL PREP PVP 2OZ (MISCELLANEOUS) ×3
SOLUTION PREP PVP 2OZ (MISCELLANEOUS) ×1 IMPLANT
SPONGE VERSALON 2X2 STRL (MISCELLANEOUS) ×20
SUT CHROMIC 4-0 (SUTURE) ×4
SUT CHROMIC 4-0 M2 12X2 ARM (SUTURE) ×2
SUT MERSILENE 4-0 S-2 (SUTURE) ×3 IMPLANT
SUT PLAIN GUT (SUTURE) ×3 IMPLANT
SUTURE CHRMC 4-0 M2 12X2 ARM (SUTURE) ×2 IMPLANT
SYR 3ML LL SCALE MARK (SYRINGE) ×3 IMPLANT
SYRINGE 10CC LL (SYRINGE) ×3 IMPLANT
WATER STERILE IRR 250ML POUR (IV SOLUTION) ×3 IMPLANT

## 2017-12-12 NOTE — Anesthesia Postprocedure Evaluation (Signed)
Anesthesia Post Note  [name] [facility]  Procedure(s) Performed: REPAIR OF ECTROPION SUTURES/EXTENSIVE RIGHT (Right Face)  Patient location during evaluation: PACU Anesthesia Type: MAC Level of consciousness: awake and alert Pain management: pain level controlled Vital Signs Assessment: post-procedure vital signs reviewed and stable Respiratory status: spontaneous breathing, nonlabored ventilation, respiratory function stable and patient connected to nasal cannula oxygen Cardiovascular status: stable and blood pressure returned to baseline Postop Assessment: no apparent nausea or vomiting Anesthetic complications: no    Jolicia Delira

## 2017-12-12 NOTE — Anesthesia Preprocedure Evaluation (Addendum)
Anesthesia Evaluation  Patient identified by MRN, date of birth, ID band  Reviewed: NPO status   History of Anesthesia Complications Negative for: history of anesthetic complications  Airway Mallampati: II  TM Distance: >3 FB Neck ROM: full    Dental  (+) Upper Dentures   Pulmonary neg pulmonary ROS,    Pulmonary exam normal        Cardiovascular Exercise Tolerance: Good hypertension (BP meds increased 12/14), + CAD (stent x6 (last in 2011)), + Past MI (1992), + Cardiac Stents (x6) and + CABG (1992)  negative cardio ROS Normal cardiovascular exam+ dysrhythmias (EP ablation on 01/2017) Atrial Fibrillation and Supra Ventricular Tachycardia   echO: 2017:  NORMAL LEFT VENTRICULAR SYSTOLIC FUNCTION NORMAL RIGHT VENTRICULAR SYSTOLIC FUNCTION MODERATE Mitral REGURGITATION  NO VALVULAR STENOSIS;  cards stable: 08/2017: dr. Nehemiah Massed;  ep ablation: 01/2017;   Neuro/Psych negative neurological ROS  negative psych ROS   GI/Hepatic negative GI ROS, Neg liver ROS,   Endo/Other  negative endocrine ROS  Renal/GU CRFRenal disease (ckd)   bph negative genitourinary   Musculoskeletal  (+) Arthritis ,   Abdominal   Peds  Hematology negative hematology ROS (+)   Anesthesia Other Findings Pt and wife spoke with Dr. Doy Hutching (PCP) on 12/17 about proceeding with eye surgery today.  Last coumadin: 12/14;   Reproductive/Obstetrics                            Anesthesia Physical Anesthesia Plan  ASA: III  Anesthesia Plan: MAC   Post-op Pain Management:    Induction:   PONV Risk Score and Plan:   Airway Management Planned:   Additional Equipment:   Intra-op Plan:   Post-operative Plan:   Informed Consent: I have reviewed the patients History and Physical, chart, labs and discussed the procedure including the risks, benefits and alternatives for the proposed anesthesia with the patient or authorized  representative who has indicated his/her understanding and acceptance.     Plan Discussed with: CRNA  Anesthesia Plan Comments:         Anesthesia Quick Evaluation

## 2017-12-12 NOTE — Transfer of Care (Signed)
Immediate Anesthesia Transfer of Care Note  [name]  Procedure(s) Performed: REPAIR OF ECTROPION SUTURES/EXTENSIVE RIGHT (Right Face)  Patient Location: PACU  Anesthesia Type: MAC  Level of Consciousness: awake, alert  and patient cooperative  Airway and Oxygen Therapy: Patient Spontanous Breathing and Patient connected to supplemental oxygen  Post-op Assessment: Post-op Vital signs reviewed, Patient's Cardiovascular Status Stable, Respiratory Function Stable, Patent Airway and No signs of Nausea or vomiting  Post-op Vital Signs: Reviewed and stable  Complications: No apparent anesthesia complications

## 2017-12-12 NOTE — H&P (Signed)
See the history and physical completed at [facility] on 11/28/17 and scanned into the chart.

## 2017-12-12 NOTE — Anesthesia Procedure Notes (Signed)
Procedure Name: MAC Date/Time: 12/12/2017 10:15 AM Performed by: Cameron Ali, CRNA Pre-anesthesia Checklist: Patient identified, Emergency Drugs available, Suction available, Timeout performed and Patient being monitored Patient Re-evaluated:Patient Re-evaluated prior to induction Oxygen Delivery Method: Nasal cannula Placement Confirmation: positive ETCO2

## 2017-12-12 NOTE — Interval H&P Note (Signed)
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 After consideration of risks, benefits and other options for treatment, the patient has consented to  Procedure(s): REPAIR OF ECTROPION SUTURES/EXTENSIVE RIGHT (Right) as a surgical intervention .  The patient's history has been reviewed, patient examined, no change in status, stable for surgery.  I have reviewed the patient's chart and labs.  Questions were answered to the patient's satisfaction.     Vickki Muff, Debbe Crumble M

## 2017-12-12 NOTE — Op Note (Signed)
Preoperative Diagnosis:   1.  Lower eyelid laxity with entropion, right lower eyelid(s). 2.  Spastic Entropion right Lower eyelid(s)  Postoperative Diagnosis:   Same.  Procedure(s) Performed:  1.  Lateral tarsal strip procedure,  right  lower eyelid(s). 2.  Quickert Sutures for entropion right lower eyelid(s)  Surgeon: Philis Pique. Vickki Muff, M.D.  Assistants: none  Anesthesia: MAC  Specimens: None.  Estimated Blood Loss: Minimal.  Complications: None.  Operative Findings: None   Procedure:   Allergies were reviewed and the patient Codeine.    After discussing the risks, benefits, complications, and alternatives with the patient, appropriate informed consent was obtained. The patient was brought to the operating suite and reclined supine. Time out was conducted and the patient was sedated.  Local anesthetic consisting of a 50-50 mixture of 2% lidocaine with epinephrine and 0.75% bupivacaine with added Hylenex was injected subcutaneously to the right lateral canthal region(s) and lower eyelid(s). Additional anesthetic was injected subconjunctivally to the right lower eyelid(s). Finally, anesthetic was injected down to the periosteum of the right lateral orbital rim(s).  After adequate local was instilled, the patient was prepped and draped in the usual sterile fashion for eyelid surgery. Attention was turned to the right lateral canthal angle. Westcott scissors were used to create a lateral canthotomy. Hemostasis was obtained with bipolar cautery. An inferior cantholysis was then performed with additional bipolar hemostasis. The anterior and posterior lamella of the lid were divided for approximately 8 mm.  A strip of the epithelium was excised off the superior margin of the tarsal strip and conjunctiva and retractors were incised off the inferior margin of the tarsal strip.   Two double-armed 4-0 chromic sutures were passed medially and laterally through the lower lid from the conjunctival  fornix, anteriorly through the skin just below the border of the tarsal plate. Each of these sutures was tied off and this created a nice outward rolling of the lid margin.  A double-armed 4-0 [place] suture was then passed each arm through the terminal portion of the tarsal strip. Each arm of the suture was then passed through the periosteum of the inner portion of the lateral orbital rim at the level of Whitnall's tubercle. The sutures were advanced and this provided nice elevation and tightening of the lower eyelid. Once the suture was secured, a thin strip of follicle-bearing skin was excised. The lateral canthal angle was reformed with an interrupted 6-0 fast absorbing plain suture. Orbicularis was reapproximated with horizontal subcuticular 6-0 fast absorbing plain gut sutures. The skin was closed with interrupted 6-0 fast absorbing plain gut sutures.   Attention was then turned to the opposite eyelid where the same procedure was performed in the same manner.   The patient tolerated the procedure well. Erythromycin ophthalmic ointment was applied to the incision site(s) followed by ice packs. The patient was taken to the recovery area where he recovered without difficulty.  Post-Op Plan/Instructions:  The patient was instructed to use ice packs frequently for the next 48 hours. He was instructed to use erythromycin ophthalmic ointment on his incisions 4 times a day for the next 12 to 14 days. He was given a prescription for Percocet for pain control should Tylenol not be effective. He was asked to to follow up at the [facility] in [place], [place] in 3-4 weeks' time or sooner as needed for problems.   Morna Flud M. Vickki Muff, M.D. Ophthalmology

## 2017-12-13 ENCOUNTER — Encounter: Payer: Self-pay | Admitting: Ophthalmology

## 2018-04-24 ENCOUNTER — Ambulatory Visit: Payer: Medicare Other | Admitting: Urology

## 2018-05-29 ENCOUNTER — Ambulatory Visit: Payer: Medicare Other | Admitting: Urology

## 2018-06-13 DIAGNOSIS — I493 Ventricular premature depolarization: Secondary | ICD-10-CM | POA: Insufficient documentation

## 2018-06-13 DIAGNOSIS — R002 Palpitations: Secondary | ICD-10-CM | POA: Insufficient documentation

## 2019-01-10 DIAGNOSIS — R7309 Other abnormal glucose: Secondary | ICD-10-CM | POA: Insufficient documentation

## 2019-01-10 DIAGNOSIS — R972 Elevated prostate specific antigen [PSA]: Secondary | ICD-10-CM | POA: Insufficient documentation

## 2019-03-04 ENCOUNTER — Other Ambulatory Visit: Payer: Self-pay | Admitting: Physical Medicine and Rehabilitation

## 2019-03-04 DIAGNOSIS — M545 Low back pain, unspecified: Secondary | ICD-10-CM

## 2019-03-11 ENCOUNTER — Other Ambulatory Visit: Payer: Self-pay

## 2019-03-11 ENCOUNTER — Ambulatory Visit
Admission: RE | Admit: 2019-03-11 | Discharge: 2019-03-11 | Disposition: A | Discharge status: Home / Self Care | Payer: Medicare Other | Source: Ambulatory Visit | Attending: Physical Medicine and Rehabilitation | Admitting: Physical Medicine and Rehabilitation

## 2019-03-11 DIAGNOSIS — M545 Low back pain, unspecified: Secondary | ICD-10-CM

## 2019-07-08 DIAGNOSIS — I872 Venous insufficiency (chronic) (peripheral): Secondary | ICD-10-CM | POA: Insufficient documentation

## 2019-07-08 DIAGNOSIS — R001 Bradycardia, unspecified: Secondary | ICD-10-CM | POA: Insufficient documentation

## 2019-11-05 NOTE — Progress Notes (Signed)
11/06/2019 1:28 PM   Robert Rivas 08-13-1941 [id number]  Referring provider: Idelle Crouch, MD [facility] [place],  [place] [address]  Chief Complaint  Patient presents with  . Benign Prostatic Hypertrophy    HPI: Robert Rivas is a 78 year old male with a history of BPH with LU TS who is referred by his PCP, Dr. Doy Hutching, for elevated PSA.  Patient's PSA trend 3.86 on February 27, 2015 4.91 on March 08, 2016 4.00 on September 06, 2016 3.29 on October 25, 2017 4.48 on October 29, 2018 5.87 on February 07, 2019 4.38 on May 31, 2019 6.24 on October 14, 2019  BPH WITH LUTS  (prostate and/or bladder) IPSS score: 11/2   PVR: 104 mL     Major complaint(s):  Nocturia x 4-5 x 4 years. Denies any dysuria, hematuria or suprapubic pain.   Currently taking: Vesicare 10 mg daily -this was started by his primary care physician and he states that it has reduced his nocturia.  He also has a appointment at the [facility] to obtain a CPAP machine  Denies any recent fevers, chills, nausea or vomiting.  He does not have a family history of PCa.  IPSS    Row Name 11/06/19 1300         International Prostate Symptom Score   How often have you had the sensation of not emptying your bladder?  Less than half the time     How often have you had to urinate less than every two hours?  Less than half the time     How often have you found you stopped and started again several times when you urinated?  Less than half the time     How often have you found it difficult to postpone urination?  Less than 1 in 5 times     How often have you had a weak urinary stream?  Not at All     How often have you had to strain to start urination?  Less than 1 in 5 times     How many times did you typically get up at night to urinate?  3 Times     Total IPSS Score  11       Quality of Life due to urinary symptoms   If you were to spend the rest of your life with your urinary condition just  the way it is now how would you feel about that?  Mostly Satisfied        Score:  1-7 Mild 8-19 Moderate 20-35 Severe   PMH: Past Medical History:  Diagnosis Date  . A-fib ([place])   . Arthritis    lower back  . BPH (benign prostatic hyperplasia)   . Cancer ([place])    Skin Cancer  . Chronic kidney disease    Chronic Kidney Disease  . Coronary artery disease   . Dysrhythmia    Atrial Fibrillation; Supraventricular Tachycardia  . Gout   . Hearing aid worn    has, does not wear  . Hyperlipidemia   . Hypertension   . MI, old   . Nocturia   . Phlebitis   . Wears dentures    partial    Surgical History: Past Surgical History:  Procedure Laterality Date  . cardaic stents    . CARDIAC CATHETERIZATION     PTCA with Stent Placement  . COLONOSCOPY WITH PROPOFOL N/A 12/15/2015   Procedure: COLONOSCOPY WITH PROPOFOL;  Surgeon: Lollie Sails, MD;  Location: [facility] ENDOSCOPY;  Service: Endoscopy;  Laterality: N/A;  . CORONARY ARTERY BYPASS GRAFT  1992  . ECTROPION REPAIR Right 12/12/2017   Procedure: REPAIR OF ECTROPION SUTURES/EXTENSIVE RIGHT;  Surgeon: Karle Starch, MD;  Location: [facility];  Service: Ophthalmology;  Laterality: Right;  . HERNIA REPAIR    . SVT ABLATION  02/22/2017   [facility]    Home Medications:  Allergies as of 11/06/2019      Reactions   Codeine Nausea And Vomiting, Nausea Only   Tizanidine Other (See Comments)   drowsiness      Medication List       Accurate as of November 06, 2019  1:28 PM. If you have any questions, ask your nurse or doctor.        STOP taking these medications   predniSONE 10 MG tablet Commonly known as: DELTASONE Stopped by: Kippy Gohman, PA-C   Tussionex Pennkinetic ER 10-8 MG/5ML Suer Generic drug: chlorpheniramine-HYDROcodone Stopped by: Zara Council, PA-C     TAKE these medications   alfuzosin 10 MG 24 hr tablet Commonly known as: Uroxatral Take 1 tablet (10 mg total) by mouth daily with  breakfast.   amiodarone 100 MG tablet Commonly known as: PACERONE Take 100 mg by mouth daily.   amLODipine 10 MG tablet Commonly known as: NORVASC Take 10 mg by mouth daily.   BD Integra Syringe 25G X 1" 3 ML Misc Generic drug: SYRINGE-NEEDLE (DISP) 3 ML Use 1 Syringe monthly.   benzonatate 100 MG capsule Commonly known as: TESSALON Take 100 mg by mouth 3 (three) times daily as needed for cough.   cyanocobalamin 1000 MCG/ML injection Commonly known as: (VITAMIN B-12) Inject 1,000 mcg into the muscle every 30 (thirty) days.   diphenhydramine-acetaminophen 25-500 MG Tabs tablet Commonly known as: TYLENOL PM Take 0.5 tablets by mouth at bedtime as needed.   docusate sodium 100 MG capsule Commonly known as: COLACE Take 100 mg by mouth daily.   erythromycin ophthalmic ointment Commonly known as: Romycin Use a small amount on your sutures 4 times a day for the next 2 weeks. Switch to Aquaphor ointment should allergy develop.   ezetimibe 10 MG tablet Commonly known as: ZETIA Take 10 mg by mouth at bedtime.   isosorbide dinitrate 10 MG tablet Commonly known as: ISORDIL Take 30 mg by mouth 2 (two) times daily.   isosorbide mononitrate 30 MG 24 hr tablet Commonly known as: IMDUR Take by mouth.   losartan 25 MG tablet Commonly known as: COZAAR Take by mouth.   methocarbamol 500 MG tablet Commonly known as: ROBAXIN 1/2-1 po bid prn   metoprolol tartrate 25 MG tablet Commonly known as: LOPRESSOR Take 1 tablet (25 mg total) by mouth every 30 (thirty) minutes as needed (palpitations/rapid heart rate). If you have taken 2 doses and are still having palpitations/symptoms.  Call your cardiologist or go immediately to the emergency department. What changed: additional instructions   montelukast 10 MG tablet Commonly known as: SINGULAIR Take by mouth.   mupirocin ointment 2 % Commonly known as: BACTROBAN Apply topically.   nabumetone 500 MG tablet Commonly known as:  RELAFEN Take 500 mg by mouth 2 (two) times daily as needed for moderate pain.   nitroGLYCERIN 0.4 MG SL tablet Commonly known as: NITROSTAT   olmesartan 20 MG tablet Commonly known as: BENICAR TAKE 1 TABLET BY MOUTH ONCE DAILY   probenecid 500 MG tablet Commonly known as: BENEMID Take by mouth. What  changed: Another medication with the same name was removed. Continue taking this medication, and follow the directions you see here. Changed by: Zara Council, PA-C   silodosin 8 MG Caps capsule Commonly known as: RAPAFLO Take 1 capsule (8 mg total) by mouth daily with breakfast.   simvastatin 20 MG tablet Commonly known as: ZOCOR Take by mouth. What changed: Another medication with the same name was removed. Continue taking this medication, and follow the directions you see here. Changed by: Zara Council, PA-C   solifenacin 10 MG tablet Commonly known as: VESICARE Take by mouth. What changed: Another medication with the same name was removed. Continue taking this medication, and follow the directions you see here. Changed by: Zara Council, PA-C   tolterodine 4 MG 24 hr capsule Commonly known as: DETROL LA Take 4 mg by mouth daily.   traMADol 50 MG tablet Commonly known as: ULTRAM TAKE 1 TABLET BY MOUTH TWICE DAILY AS NEEDED What changed: Another medication with the same name was removed. Continue taking this medication, and follow the directions you see here. Changed by: Zara Council, PA-C   warfarin 2 MG tablet Commonly known as: COUMADIN Take by mouth. What changed: Another medication with the same name was removed. Continue taking this medication, and follow the directions you see here. Changed by: Zara Council, PA-C       Allergies:  Allergies  Allergen Reactions  . Codeine Nausea And Vomiting and Nausea Only  . Tizanidine Other (See Comments)    drowsiness    Family History: Family History  Problem Relation Age of Onset  . Kidney cancer  Maternal Uncle   . Prostate cancer Neg Hx   . Bladder Cancer Neg Hx     Social History:  reports that he has never smoked. He has never used smokeless tobacco. He reports that he does not drink alcohol or use drugs.  ROS: UROLOGY Frequent Urination?: No Hard to postpone urination?: No Burning/pain with urination?: No Get up at night to urinate?: No Leakage of urine?: No Urine stream starts and stops?: No Trouble starting stream?: No Do you have to strain to urinate?: No Blood in urine?: No Urinary tract infection?: No Sexually transmitted disease?: No Injury to kidneys or bladder?: No Painful intercourse?: No Weak stream?: No Erection problems?: No Penile pain?: No  Gastrointestinal Nausea?: No Vomiting?: No Indigestion/heartburn?: No Diarrhea?: No Constipation?: No  Constitutional Fever: No Night sweats?: No Weight loss?: No Fatigue?: No  Skin Skin rash/lesions?: No Itching?: No  Eyes Blurred vision?: No Double vision?: No  Ears/Nose/Throat Sore throat?: No Sinus problems?: No  Hematologic/Lymphatic Swollen glands?: No Easy bruising?: No  Cardiovascular Leg swelling?: No Chest pain?: No  Respiratory Cough?: No Shortness of breath?: No  Endocrine Excessive thirst?: No  Musculoskeletal Back pain?: No Joint pain?: No  Neurological Headaches?: No Dizziness?: No  Psychologic Depression?: No Anxiety?: No  Physical Exam: BP (!) 175/73 (BP Location: Left Arm, Patient Position: Sitting, Cuff Size: Normal)   Pulse 73   Ht 6\' 1"  (1.854 m)   Wt 207 lb 4.8 oz (94 kg)   BMI 27.35 kg/m   Constitutional:  Well nourished. Alert and oriented, No acute distress. HEENT:  AT, moist mucus membranes.  Trachea midline, no masses. Cardiovascular: No clubbing, cyanosis, or edema. Respiratory: Normal respiratory effort, no increased work of breathing. GI: Abdomen is soft, non tender, non distended, no abdominal masses. Liver and spleen not palpable.   No hernias appreciated.  Stool sample for occult testing is  not indicated.   GU: No CVA tenderness.  No bladder fullness or masses.  Patient with uncircumcised phallus.   Foreskin could not be retracted.   1 cm aperature Urethral meatus is patent.  No penile discharge. No penile lesions or rashes. Scrotum without lesions, cysts, rashes and/or edema.  Testicles are located scrotally bilaterally. No masses are appreciated in the testicles. Left and right epididymis are normal. Rectal: Patient with  normal sphincter tone. Anus and perineum without scarring or rashes. No rectal masses are appreciated. Prostate is approximately 60 grams, could only palpate the apex and the midportion of the gland, no nodules are appreciated. Seminal vesicles could not be palpated.   Skin: No rashes, bruises or suspicious lesions. Lymph: No cervical or inguinal adenopathy. Neurologic: Grossly intact, no focal deficits, moving all 4 extremities. Psychiatric: Normal mood and affect.  Laboratory Data: Lab Results  Component Value Date   WBC 4.7 10/16/2016   HGB 14.5 10/16/2016   HCT 41.3 10/16/2016   MCV 95.1 10/16/2016   PLT 192 10/16/2016    Lab Results  Component Value Date   CREATININE 1.52 (H) 10/16/2016    No results found for: PSA  No results found for: TESTOSTERONE  No results found for: HGBA1C  No results found for: TSH  No results found for: CHOL, HDL, CHOLHDL, VLDL, LDLCALC  Lab Results  Component Value Date   AST 31 04/11/2014   Lab Results  Component Value Date   ALT 48 04/11/2014   No components found for: ALKALINEPHOPHATASE No components found for: BILIRUBINTOTAL  No results found for: ESTRADIOL  Urinalysis    Component Value Date/Time   COLORURINE Yellow 10/05/2013 1518   APPEARANCEUR Cloudy (A) 03/25/2016 1524   LABSPEC 1.024 10/05/2013 1518   PHURINE 6.0 10/05/2013 1518   GLUCOSEU Negative 03/25/2016 1524   GLUCOSEU Negative 10/05/2013 1518   HGBUR 3+ 10/05/2013 1518    BILIRUBINUR Negative 03/25/2016 1524   BILIRUBINUR Negative 10/05/2013 1518   KETONESUR Negative 10/05/2013 1518   PROTEINUR Negative 03/25/2016 1524   PROTEINUR 100 mg/dL 10/05/2013 1518   NITRITE Negative 03/25/2016 1524   NITRITE Negative 10/05/2013 1518   LEUKOCYTESUR 2+ (A) 03/25/2016 1524   LEUKOCYTESUR 3+ 10/05/2013 1518    I have reviewed the labs.   Pertinent Imaging: Results for Robert Rivas, Robert Rivas (MRN [id number]) as of 11/06/2019 13:11  Ref. Range 11/06/2019 13:04  Scan Result Unknown 186mL    Assessment & Plan:    1. Elevated PSA Discussed with the patient that PSA is an acronym for  prostate specific antigen,  which is a protein made by the prostate gland and can be detected in the blood stream. I explained to the patient situations that would increase the PSA, such as: a man's age,  BPH, infection, recent intercourse/ejaculation, prostate infarction, recent urethroscopic manipulation (Foley placement/cystoscopy) and prostate cancer.  We discussed that indications for prostate biopsy are defined by age and race specific PSA cutoffs as well as a PSA velocity of 0.75/year.  Explained that due to his age and comorbidities, prostate cancer treatment would be approached in a palliative manner versus curative and he other health concerns take precedence at this time and would have a greater bearing on his life span vs prostate cancer at this time He does have the option to be surveyed at this time for prostate cancer, but it may involve invasive and risky procedures for diagnosis, I did advise that a high grade cancer would have the potential to spread and  cause organ failure and/or pathological fractures.   We have decided to have him return in 6 months for PSA and exam  2. BPH with LUTS IPSS score is 11/2 Continue conservative management, avoiding bladder irritants and timed voiding's Most bothersome symptoms is/are nocturia RTC in 6 months for IPSS, PSA, PVR and exam   3.   Nocturia Patient will be obtaining CPAP machine through the [place] and we will reassess when he returns in 6 months  Return in about 6 months (around 05/05/2020) for IPSS, PSA, PVR and exam.  These notes generated with voice recognition software. I apologize for typographical errors.  Zara Council, PA-C  [facility] [address]  [place], [place] [address] [phone]

## 2019-11-06 ENCOUNTER — Other Ambulatory Visit: Payer: Self-pay

## 2019-11-06 ENCOUNTER — Encounter: Payer: Self-pay | Admitting: Urology

## 2019-11-06 ENCOUNTER — Ambulatory Visit (INDEPENDENT_AMBULATORY_CARE_PROVIDER_SITE_OTHER): Payer: Medicare Other | Admitting: Urology

## 2019-11-06 VITALS — BP 175/73 | HR 73 | Ht 73.0 in | Wt 207.3 lb

## 2019-11-06 DIAGNOSIS — R351 Nocturia: Secondary | ICD-10-CM | POA: Diagnosis not present

## 2019-11-06 DIAGNOSIS — N401 Enlarged prostate with lower urinary tract symptoms: Secondary | ICD-10-CM | POA: Diagnosis not present

## 2019-11-06 DIAGNOSIS — R972 Elevated prostate specific antigen [PSA]: Secondary | ICD-10-CM | POA: Diagnosis not present

## 2019-11-06 DIAGNOSIS — N138 Other obstructive and reflux uropathy: Secondary | ICD-10-CM

## 2019-11-06 LAB — BLADDER SCAN AMB NON-IMAGING

## 2019-11-13 ENCOUNTER — Ambulatory Visit: Payer: Medicare Other | Admitting: Urology

## 2020-05-04 ENCOUNTER — Encounter: Payer: Self-pay | Admitting: Urology

## 2020-05-04 ENCOUNTER — Other Ambulatory Visit
Admission: RE | Admit: 2020-05-04 | Discharge: 2020-05-04 | Disposition: A | Discharge status: Home / Self Care | Payer: Medicare Other | Source: Ambulatory Visit | Attending: Urology | Admitting: Urology

## 2020-05-04 ENCOUNTER — Other Ambulatory Visit: Payer: Self-pay

## 2020-05-04 ENCOUNTER — Other Ambulatory Visit: Payer: Medicare Other

## 2020-05-04 DIAGNOSIS — N138 Other obstructive and reflux uropathy: Secondary | ICD-10-CM | POA: Diagnosis present

## 2020-05-04 DIAGNOSIS — N401 Enlarged prostate with lower urinary tract symptoms: Secondary | ICD-10-CM | POA: Insufficient documentation

## 2020-05-04 LAB — PSA: Prostatic Specific Antigen: 5.35 ng/mL — ABNORMAL HIGH (ref 0.00–4.00)

## 2020-05-04 NOTE — Addendum Note (Signed)
Addended by: Santiago Bur on: 05/04/2020 02:07 PM   Modules accepted: Orders

## 2020-05-06 ENCOUNTER — Ambulatory Visit (INDEPENDENT_AMBULATORY_CARE_PROVIDER_SITE_OTHER): Payer: Medicare Other | Admitting: Dermatology

## 2020-05-06 ENCOUNTER — Other Ambulatory Visit: Payer: Self-pay

## 2020-05-06 ENCOUNTER — Encounter: Payer: Self-pay | Admitting: Dermatology

## 2020-05-06 DIAGNOSIS — L578 Other skin changes due to chronic exposure to nonionizing radiation: Secondary | ICD-10-CM

## 2020-05-06 DIAGNOSIS — L570 Actinic keratosis: Secondary | ICD-10-CM

## 2020-05-06 DIAGNOSIS — D229 Melanocytic nevi, unspecified: Secondary | ICD-10-CM

## 2020-05-06 DIAGNOSIS — Z1283 Encounter for screening for malignant neoplasm of skin: Secondary | ICD-10-CM | POA: Diagnosis not present

## 2020-05-06 DIAGNOSIS — Z86006 Personal history of melanoma in-situ: Secondary | ICD-10-CM

## 2020-05-06 DIAGNOSIS — D1800 Hemangioma unspecified site: Secondary | ICD-10-CM | POA: Diagnosis not present

## 2020-05-06 DIAGNOSIS — D692 Other nonthrombocytopenic purpura: Secondary | ICD-10-CM | POA: Diagnosis not present

## 2020-05-06 DIAGNOSIS — L821 Other seborrheic keratosis: Secondary | ICD-10-CM

## 2020-05-06 DIAGNOSIS — Z85820 Personal history of malignant melanoma of skin: Secondary | ICD-10-CM

## 2020-05-06 DIAGNOSIS — Z86007 Personal history of in-situ neoplasm of skin: Secondary | ICD-10-CM

## 2020-05-06 DIAGNOSIS — L820 Inflamed seborrheic keratosis: Secondary | ICD-10-CM | POA: Diagnosis not present

## 2020-05-06 DIAGNOSIS — L814 Other melanin hyperpigmentation: Secondary | ICD-10-CM

## 2020-05-06 NOTE — Progress Notes (Signed)
Follow-Up Visit   Subjective  Robert Rivas is a 79 y.o. male who presents for the following: Actinic Keratosis (scalp, ears, face, >78m f/u) and UBSE (hx Melanoma IS, Hx SCC IS). Patient presents for upper body skin exam for skin cancer screening and mole check.  The following portions of the chart were reviewed this encounter and updated as appropriate:  Tobacco  Allergies  Meds  Problems  Med Hx  Surg Hx  Fam Hx      Review of Systems:  No other skin or systemic complaints except as noted in HPI or Assessment and Plan.  Objective  Well appearing patient in no apparent distress; mood and affect are within normal limits.  All skin waist up examined.  Objective  Scalp/face x 20 (20): Pink scaly macules   Objective  Left Upper Back: Well healed scar with no evidence of recurrence, no lymphadenopathy.   Objective  multiple: Well healed scar with no evidence of recurrence.   Objective  R temple x 1, Scalp x 1 (2): Erythematous keratotic or waxy stuck-on papule or plaque.    Assessment & Plan    Lentigines - Scattered tan macules - Discussed due to sun exposure - Benign, observe - Call for any changes  Seborrheic Keratoses - Stuck-on, waxy, tan-brown papules and plaques  - Discussed benign etiology and prognosis. - Observe - Call for any changes  Melanocytic Nevi - Tan-brown and/or pink-flesh-colored symmetric macules and papules - Benign appearing on exam today - Observation - Call clinic for new or changing moles - Recommend daily use of broad spectrum spf 30+ sunscreen to sun-exposed areas.   Hemangiomas - Red papules - Discussed benign nature - Observe - Call for any changes  Actinic Damage - diffuse scaly erythematous macules with underlying dyspigmentation - Recommend daily broad spectrum sunscreen SPF 30+ to sun-exposed areas, reapply every 2 hours as needed.  - Call for new or changing lesions.  Skin cancer screening performed  today.  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  Destruction of lesion - Scalp/face x 20 Complexity: simple   Destruction method: cryotherapy   Informed consent: discussed and consent obtained   Timeout:  patient name, date of birth, surgical site, and procedure verified Lesion destroyed using liquid nitrogen: Yes   Region frozen until ice ball extended beyond lesion: Yes   Outcome: patient tolerated procedure well with no complications   Post-procedure details: wound care instructions given    History of melanoma Left Upper Back  In Situ  Clear. Observe for recurrence. Call clinic for new or changing lesions.  Recommend regular skin exams, daily broad-spectrum spf 30+ sunscreen use, and photoprotection.     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 Observe for recurrence. Call clinic for new or changing lesions.  Recommend regular skin exams, daily broad-spectrum spf 30+ sunscreen use, and photoprotection.     Inflamed seborrheic keratosis (2) R temple x 1, Scalp x 1  Destruction of lesion - R temple x 1, Scalp x 1 Complexity: simple   Destruction method: cryotherapy   Informed consent: discussed and consent obtained   Timeout:  patient name, date  of birth, surgical site, and procedure verified Lesion destroyed using liquid nitrogen: Yes   Region frozen until ice ball extended beyond lesion: Yes   Outcome: patient tolerated procedure well with no complications   Post-procedure details: wound care instructions given    Return in about 1 month (around 06/06/2020) for PDT to scalp, then 2 months PDT for face, 14m f/u Dr. Nehemiah Massed.   I, Othelia Pulling,  RMA, am acting as scribe for Sarina Ser, MD .  Documentation: I have reviewed the above documentation for accuracy and completeness, and I agree with the above.  Sarina Ser, MD

## 2020-05-06 NOTE — Patient Instructions (Signed)
Cryotherapy Aftercare  . Wash gently with soap and water everyday.   . Apply Vaseline and Band-Aid daily until healed.  

## 2020-05-07 ENCOUNTER — Ambulatory Visit (INDEPENDENT_AMBULATORY_CARE_PROVIDER_SITE_OTHER): Payer: Medicare Other | Admitting: Urology

## 2020-05-07 ENCOUNTER — Encounter: Payer: Self-pay | Admitting: Urology

## 2020-05-07 VITALS — BP 155/68 | HR 78 | Ht 73.0 in | Wt 204.0 lb

## 2020-05-07 DIAGNOSIS — N138 Other obstructive and reflux uropathy: Secondary | ICD-10-CM

## 2020-05-07 DIAGNOSIS — R972 Elevated prostate specific antigen [PSA]: Secondary | ICD-10-CM

## 2020-05-07 DIAGNOSIS — R351 Nocturia: Secondary | ICD-10-CM

## 2020-05-07 DIAGNOSIS — N432 Other hydrocele: Secondary | ICD-10-CM | POA: Diagnosis not present

## 2020-05-07 DIAGNOSIS — N401 Enlarged prostate with lower urinary tract symptoms: Secondary | ICD-10-CM | POA: Diagnosis not present

## 2020-05-07 LAB — BLADDER SCAN AMB NON-IMAGING: Scan Result: 59

## 2020-05-07 NOTE — Progress Notes (Signed)
05/07/2020 2:31 PM   Janelle Floor Dorothy Puffer 1941/02/05 [id number]  Referring provider: Idelle Crouch, MD [facility] [place],  [place] [address]  Chief Complaint  Patient presents with  . Benign Prostatic Hypertrophy    HPI: Mr. Frederique is a 79 year old male with BPH with LU TS and elevated PSA who presents today for follow up.  Patient's PSA trend from Dr. Stacie Glaze office  3.86 on February 27, 2015 4.91 on March 08, 2016 4.00 on September 06, 2016 3.29 on October 25, 2017 4.48 on October 29, 2018 5.87 on February 07, 2019 4.38 on May 31, 2019 6.24 on October 14, 2019 5.21 on April 17, 2020 Component     Latest Ref Rng & Units 05/04/2020  Prostatic Specific Antigen     0.00 - 4.00 ng/mL 5.35 (H)   BPH WITH LUTS  (prostate and/or bladder) I PSS: 7/2      PVR: 59 mL   Previous IPSS score: 11/2   Previous PVR: 104 mL     Major complaint(s):  None.  He has not yet acquired his CPAP machine.  He is taking silodosin and that has reduced some of nocturia.  Patient denies any modifying or aggravating factors.  Patient denies any gross hematuria, dysuria or suprapubic/flank pain.  Patient denies any fevers, chills, nausea or vomiting.   He does not have a family history of PCa.  IPSS    Row Name 05/07/20 1300         International Prostate Symptom Score   How often have you had the sensation of not emptying your bladder?  Less than 1 in 5     How often have you had to urinate less than every two hours?  Not at All     How often have you found you stopped and started again several times when you urinated?  Less than 1 in 5 times     How often have you found it difficult to postpone urination?  Less than 1 in 5 times     How often have you had a weak urinary stream?  Less than 1 in 5 times     How often have you had to strain to start urination?  Not at All     How many times did you typically get up at night to urinate?  3 Times     Total IPSS Score  7         Quality of Life due to urinary symptoms   If you were to spend the rest of your life with your urinary condition just the way it is now how would you feel about that?  Mostly Satisfied        Score:  1-7 Mild 8-19 Moderate 20-35 Severe   PMH: Past Medical History:  Diagnosis Date  . A-fib ([place])   . Arthritis    lower back  . BPH (benign prostatic hyperplasia)   . Cancer ([place])    Skin Cancer  . Chronic kidney disease    Chronic Kidney Disease  . Coronary artery disease   . Dysrhythmia    Atrial Fibrillation; Supraventricular Tachycardia  . Gout   . Hearing aid worn    has, does not wear  . 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 Hyperlipidemia   . Hypertension   . MI, old   .  Nocturia   . Phlebitis   . 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 Squamous cell carcinoma of skin 03/26/2018   SCC IS R forearm  . Squamous cell carcinoma of skin 01/01/2008   SCC IS R forearm  . Wears dentures    partial    Surgical History: Past Surgical History:  Procedure Laterality Date  . cardaic stents    . CARDIAC CATHETERIZATION     PTCA with Stent Placement  . COLONOSCOPY WITH PROPOFOL N/A 12/15/2015   Procedure: COLONOSCOPY WITH PROPOFOL;  Surgeon: Lollie Sails, MD;  Location: [facility] ENDOSCOPY;  Service: Endoscopy;  Laterality: N/A;  . CORONARY ARTERY BYPASS GRAFT  1992  . ECTROPION REPAIR Right 12/12/2017   Procedure: REPAIR OF ECTROPION SUTURES/EXTENSIVE RIGHT;  Surgeon: Karle Starch, MD;  Location: [place];  Service: Ophthalmology;  Laterality: Right;  . HERNIA REPAIR    . SVT ABLATION  02/22/2017   [facility]    Home Medications:  Current Outpatient Medications on File Prior to Visit  Medication Sig Dispense Refill  . ezetimibe (ZETIA) 10 MG tablet Take 10 mg by mouth at bedtime.     .  isosorbide dinitrate (ISORDIL) 10 MG tablet Take 30 mg by mouth 2 (two) times daily.    Marland Kitchen olmesartan (BENICAR) 20 MG tablet TAKE 1 TABLET BY MOUTH ONCE DAILY    . probenecid (BENEMID) 500 MG tablet Take by mouth.    . simvastatin (ZOCOR) 20 MG tablet Take by mouth.    . SYRINGE-NEEDLE, DISP, 3 ML (BD INTEGRA SYRINGE) 25G X 1" 3 ML MISC Use 1 Syringe monthly.    . warfarin (COUMADIN) 2 MG tablet Take by mouth.    . metoprolol tartrate (LOPRESSOR) 25 MG tablet Take 1 tablet (25 mg total) by mouth every 30 (thirty) minutes as needed (palpitations/rapid heart rate). If you have taken 2 doses and are still having palpitations/symptoms.  Call your cardiologist or go immediately to the emergency department. (Patient taking differently: Take 25 mg by mouth every 30 (thirty) minutes as needed (palpitations/rapid heart rate). Take 1.5 tablets twice daily) 30 tablet 0  . montelukast (SINGULAIR) 10 MG tablet Take by mouth.    . nitroGLYCERIN (NITROSTAT) 0.4 MG SL tablet      No current facility-administered medications on file prior to visit.    Allergies:  Allergies  Allergen Reactions  . Codeine Nausea And Vomiting and Nausea Only  . Tizanidine Other (See Comments)    drowsiness    Family History: Family History  Problem Relation Age of Onset  . Kidney cancer Maternal Uncle   . Prostate cancer Neg Hx   . Bladder Cancer Neg Hx     Social History:  reports that he has never smoked. He has never used smokeless tobacco. He reports that he does not drink alcohol or use drugs.  ROS: For pertinent review of systems please refer to history of present illness  Physical Exam: BP (!) 155/68   Pulse 78   Ht 6\' 1"  (1.854 m)   Wt 204 lb (92.5 kg)   BMI 26.91 kg/m   Constitutional:  Well nourished. Alert and oriented, No acute distress. HEENT: [place] AT, mask in place.  Trachea midline. Cardiovascular: No clubbing, cyanosis, or edema. Respiratory: Normal respiratory effort, no increased work of  breathing. GI: Abdomen is soft, non tender, non distended, no  abdominal masses. Liver and spleen not palpable.  No hernias appreciated.  Stool sample for occult testing is not indicated.   GU: No CVA tenderness.  No bladder fullness or masses.  Patient with uncircumcised phallus.  Foreskin could not be retracted fully with with 1 cm aperture.  Urethral meatus is patent.  No penile discharge. No penile lesions or rashes. Scrotum without lesions, cysts, rashes and/or edema.  Testicles are located scrotally bilaterally. Left testicle and epididymis could not be fully palpated due to likely hydrocele. No masses are appreciated in the right testicle.  Right epididymis is normal. Rectal: Patient with  normal sphincter tone. Anus and perineum without scarring or rashes. No rectal masses are appreciated. Prostate is approximately 60 + grams, could only palpate the apex and the midportion of the gland, no nodules are appreciated. Seminal vesicles could not be palpated Skin: No rashes, bruises or suspicious lesions. Lymph: No inguinal adenopathy. Neurologic: Grossly intact, no focal deficits, moving all 4 extremities. Psychiatric: Normal mood and affect.   Laboratory Data: Lab Results  Component Value Date   WBC 4.7 10/16/2016   HGB 14.5 10/16/2016   HCT 41.3 10/16/2016   MCV 95.1 10/16/2016   PLT 192 10/16/2016    Lab Results  Component Value Date   CREATININE 1.52 (H) 10/16/2016    Lab Results  Component Value Date   AST 31 04/11/2014   Lab Results  Component Value Date   ALT 48 04/11/2014    Urinalysis    Component Value Date/Time   COLORURINE Yellow 10/05/2013 1518   APPEARANCEUR Cloudy (A) 03/25/2016 1524   LABSPEC 1.024 10/05/2013 1518   PHURINE 6.0 10/05/2013 1518   GLUCOSEU Negative 03/25/2016 1524   GLUCOSEU Negative 10/05/2013 1518   HGBUR 3+ 10/05/2013 1518   BILIRUBINUR Negative 03/25/2016 1524   BILIRUBINUR Negative 10/05/2013 1518   KETONESUR Negative 10/05/2013 1518    PROTEINUR Negative 03/25/2016 1524   PROTEINUR 100 mg/dL 10/05/2013 1518   NITRITE Negative 03/25/2016 1524   NITRITE Negative 10/05/2013 1518   LEUKOCYTESUR 2+ (A) 03/25/2016 1524   LEUKOCYTESUR 3+ 10/05/2013 1518    I have reviewed the labs.   Pertinent Imaging: Results for JAYDIAN, IWANICKI (MRN [id number]) as of 05/07/2020 14:05  Ref. Range 05/07/2020 13:30  Scan Result Unknown 59     Assessment & Plan:    1. Elevated PSA PSA mostly stable over the last few years Will move to yearly office visits as his PCP, Dr. Doy Hutching, is checking his PSA as well  2. BPH with LUTS IPSS score is 7/2, it is improved Continue conservative management, avoiding bladder irritants and timed voiding's Most bothersome symptoms is/are nocturia, but it is improved with silodosin and CPAP is on the way RTC in 12 months for IPSS, PSA, PVR and exam   3.  Nocturia Improved on silodosin Will be picking up the CPAP machine in the new future  4. Left hydrocele Difficult to palpate the scrotal contents in their entirety, so will obtain scrotal ultrasound for evaluation of internal contents    Return for Scrotal ultrasound now and then IPSS, PSA and exam in one year .  These notes generated with voice recognition software. I apologize for typographical errors.  Zara Council, PA-C  [facility] [address]  [place], [place] [address] [phone]

## 2020-05-13 ENCOUNTER — Encounter: Payer: Self-pay | Admitting: Dermatology

## 2020-05-28 ENCOUNTER — Other Ambulatory Visit: Payer: Self-pay

## 2020-05-28 ENCOUNTER — Ambulatory Visit
Admission: RE | Admit: 2020-05-28 | Discharge: 2020-05-28 | Disposition: A | Discharge status: Home / Self Care | Payer: Medicare Other | Source: Ambulatory Visit | Attending: Urology | Admitting: Urology

## 2020-05-28 DIAGNOSIS — N432 Other hydrocele: Secondary | ICD-10-CM | POA: Diagnosis not present

## 2020-05-29 ENCOUNTER — Telehealth: Payer: Self-pay | Admitting: Family Medicine

## 2020-05-29 NOTE — Telephone Encounter (Signed)
LM for patient to return call.

## 2020-05-29 NOTE — Telephone Encounter (Signed)
Patient notified and voiced understanding.

## 2020-05-29 NOTE — Telephone Encounter (Signed)
-----   Message from Nori Riis, PA-C sent at 05/29/2020 10:30 AM EDT ----- Please let Mr. Cardell know that his scrotal ultrasound noted bilateral hydroceles, epididymal cyst on the right and a right varicocele.  All of these are benign findings.

## 2020-06-10 ENCOUNTER — Ambulatory Visit (INDEPENDENT_AMBULATORY_CARE_PROVIDER_SITE_OTHER): Payer: Medicare Other

## 2020-06-10 ENCOUNTER — Other Ambulatory Visit: Payer: Self-pay

## 2020-06-10 ENCOUNTER — Ambulatory Visit: Payer: Medicare Other

## 2020-06-10 DIAGNOSIS — L570 Actinic keratosis: Secondary | ICD-10-CM | POA: Diagnosis not present

## 2020-06-10 MED ORDER — AMINOLEVULINIC ACID HCL 20 % EX SOLR
1.0000 "application " | Freq: Once | CUTANEOUS | Status: AC
  Administered 2020-06-10: 354 mg via TOPICAL

## 2020-06-10 NOTE — Progress Notes (Signed)
Patient completed PDT therapy today.  1. AK (actinic keratosis) Scalp  Photodynamic therapy - Scalp Procedure discussed: discussed risks, benefits, side effects. and alternatives   Prep: site scrubbed/prepped with acetone   Location:  Scalp Number of lesions:  Multiple Type of treatment:  Blue light Aminolevulinic Acid (see MAR for details): Levulan Number of Levulan sticks used:  1 Incubation time (minutes):  60 Number of minutes under lamp:  16 Number of seconds under lamp:  40 Cooling:  Floor fan Outcome: patient tolerated procedure well with no complications   Post-procedure details: sunscreen applied    Aminolevulinic Acid HCl 20 % SOLR 354 mg - Scalp    

## 2020-06-10 NOTE — Patient Instructions (Signed)

## 2020-07-09 ENCOUNTER — Ambulatory Visit: Payer: Medicare Other

## 2020-07-23 ENCOUNTER — Ambulatory Visit (INDEPENDENT_AMBULATORY_CARE_PROVIDER_SITE_OTHER): Payer: Medicare Other

## 2020-07-23 ENCOUNTER — Other Ambulatory Visit: Payer: Self-pay

## 2020-07-23 DIAGNOSIS — L570 Actinic keratosis: Secondary | ICD-10-CM

## 2020-07-23 MED ORDER — AMINOLEVULINIC ACID HCL 20 % EX SOLR
1.0000 "application " | Freq: Once | CUTANEOUS | Status: AC
  Administered 2020-07-23: 354 mg via TOPICAL

## 2020-07-23 NOTE — Patient Instructions (Signed)

## 2020-07-23 NOTE — Progress Notes (Signed)

## 2020-08-20 ENCOUNTER — Ambulatory Visit (INDEPENDENT_AMBULATORY_CARE_PROVIDER_SITE_OTHER): Payer: Medicare Other | Admitting: Dermatology

## 2020-08-20 ENCOUNTER — Other Ambulatory Visit: Payer: Self-pay

## 2020-08-20 ENCOUNTER — Encounter: Payer: Self-pay | Admitting: Dermatology

## 2020-08-20 DIAGNOSIS — L578 Other skin changes due to chronic exposure to nonionizing radiation: Secondary | ICD-10-CM | POA: Diagnosis not present

## 2020-08-20 DIAGNOSIS — T148XXA Other injury of unspecified body region, initial encounter: Secondary | ICD-10-CM | POA: Diagnosis not present

## 2020-08-20 DIAGNOSIS — L570 Actinic keratosis: Secondary | ICD-10-CM

## 2020-08-20 DIAGNOSIS — Z85828 Personal history of other malignant neoplasm of skin: Secondary | ICD-10-CM | POA: Diagnosis not present

## 2020-08-20 DIAGNOSIS — L820 Inflamed seborrheic keratosis: Secondary | ICD-10-CM | POA: Diagnosis not present

## 2020-08-20 NOTE — Progress Notes (Signed)
   Follow-Up Visit   Subjective  Robert Rivas is a 79 y.o. male who presents for the following: Actinic Keratosis (of the face and scalp S/P PDT - check for any persistent lesions).  The following portions of the chart were reviewed this encounter and updated as appropriate:  Tobacco  Allergies  Meds  Problems  Med Hx  Surg Hx  Fam Hx     Review of Systems:  No other skin or systemic complaints except as noted in HPI or Assessment and Plan.  Objective  Well appearing patient in no apparent distress; mood and affect are within normal limits.  A focused examination was performed including the face and scalp. Relevant physical exam findings are noted in the Assessment and Plan.  Objective  Face and scalp (16): Erythematous thin papules/macules with gritty scale.   Objective  R post base of skull/neck adjacent to nevus: Crust   Objective  B/L arm (16): Erythematous keratotic or waxy stuck-on papule or plaque.   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  Destruction of lesion - Face and scalp Complexity: simple   Destruction method: cryotherapy   Informed consent: discussed and consent obtained   Timeout:  patient name, date of birth, surgical site, and procedure verified Lesion destroyed using liquid nitrogen: Yes   Region frozen until ice ball extended beyond lesion: Yes   Outcome: patient tolerated procedure well with no complications   Post-procedure details: wound care instructions given    Excoriation R post base of skull/neck adjacent to nevus  Recheck at follow up appointment - biopsy if not healed  Inflamed seborrheic keratosis (16) B/L arm  Destruction of lesion - B/L arm Complexity: simple   Destruction method: cryotherapy   Informed consent: discussed and consent obtained   Timeout:  patient name, date of birth, surgical site, and procedure verified Lesion destroyed using liquid nitrogen: Yes   Region frozen until ice ball  extended beyond lesion: Yes   Outcome: patient tolerated procedure well with no complications   Post-procedure details: wound care instructions given     Actinic Damage - diffuse scaly erythematous macules with underlying dyspigmentation - Recommend daily broad spectrum sunscreen SPF 30+ to sun-exposed areas, reapply every 2 hours as needed.  - Call for new or changing lesions.  History of Squamous Cell Carcinoma of the Skin - No evidence of recurrence today - No lymphadenopathy - Recommend regular full body skin exams - Recommend daily broad spectrum sunscreen SPF 30+ to sun-exposed areas, reapply every 2 hours as needed.  - Call if any new or changing lesions are noted between office visits  Return in about 8 weeks (around 10/15/2020) for recheck sun exposed areas and excoriation.  Luther Redo, CMA, am acting as scribe for Sarina Ser, MD .  Documentation: I have reviewed the above documentation for accuracy and completeness, and I agree with the above.  Sarina Ser, MD

## 2020-08-28 ENCOUNTER — Encounter: Payer: Self-pay | Admitting: Dermatology

## 2020-10-29 ENCOUNTER — Other Ambulatory Visit: Payer: Self-pay

## 2020-10-29 ENCOUNTER — Ambulatory Visit (INDEPENDENT_AMBULATORY_CARE_PROVIDER_SITE_OTHER): Payer: Medicare Other | Admitting: Dermatology

## 2020-10-29 DIAGNOSIS — L578 Other skin changes due to chronic exposure to nonionizing radiation: Secondary | ICD-10-CM

## 2020-10-29 DIAGNOSIS — C4441 Basal cell carcinoma of skin of scalp and neck: Secondary | ICD-10-CM

## 2020-10-29 DIAGNOSIS — L570 Actinic keratosis: Secondary | ICD-10-CM | POA: Diagnosis not present

## 2020-10-29 DIAGNOSIS — D492 Neoplasm of unspecified behavior of bone, soft tissue, and skin: Secondary | ICD-10-CM

## 2020-10-29 DIAGNOSIS — C4491 Basal cell carcinoma of skin, unspecified: Secondary | ICD-10-CM

## 2020-10-29 HISTORY — DX: Basal cell carcinoma of skin, unspecified: C44.91

## 2020-10-29 NOTE — Patient Instructions (Signed)

## 2020-10-29 NOTE — Progress Notes (Signed)
Follow-Up Visit   Subjective  Robert Rivas is a 79 y.o. male who presents for the following: Actinic Keratosis (face, scalp, 45m f/u LN2 x 16 on 08/20/20, PDT scalp 06/10/20, PDT face 07/23/20) and excoritation (R post base of skull/neck adjacent to nevus recheck ).  The following portions of the chart were reviewed this encounter and updated as appropriate:  Tobacco  Allergies  Meds  Problems  Med Hx  Surg Hx  Fam Hx     Review of Systems:  No other skin or systemic complaints except as noted in HPI or Assessment and Plan.  Objective  Well appearing patient in no apparent distress; mood and affect are within normal limits.  A focused examination was performed including neck, face, scalp, hands. Relevant physical exam findings are noted in the Assessment and Plan.  Objective  face, neck, scalp x 16 (16): Pink scaly macules   Objective  R post base of skull/neck: 1.0cm crust      Assessment & Plan    Actinic Damage - chronic, secondary to cumulative UV radiation exposure/sun exposure over time - diffuse scaly erythematous macules with underlying dyspigmentation - Recommend daily broad spectrum sunscreen SPF 30+ to sun-exposed areas, reapply every 2 hours as needed.  - Call for new or changing lesions.  AK (actinic keratosis) (16) face, neck, scalp x 16  Destruction of lesion - face, neck, scalp x 16 Complexity: simple   Destruction method: cryotherapy   Informed consent: discussed and consent obtained   Timeout:  patient name, date of birth, surgical site, and procedure verified Lesion destroyed using liquid nitrogen: Yes   Region frozen until ice ball extended beyond lesion: Yes   Outcome: patient tolerated procedure well with no complications   Post-procedure details: wound care instructions given    Neoplasm of skin R post base of skull/neck  Epidermal / dermal shaving  Lesion diameter (cm):  1 Informed consent: discussed and consent obtained     Timeout: patient name, date of birth, surgical site, and procedure verified   Procedure prep:  Patient was prepped and draped in usual sterile fashion Prep type:  Isopropyl alcohol Anesthesia: the lesion was anesthetized in a standard fashion   Anesthetic:  1% lidocaine w/ epinephrine 1-100,000 buffered w/ 8.4% NaHCO3 Instrument used: flexible razor blade   Hemostasis achieved with: pressure, aluminum chloride and electrodesiccation   Outcome: patient tolerated procedure well   Post-procedure details: sterile dressing applied and wound care instructions given   Dressing type: bandage and bacitracin    Destruction of lesion Complexity: extensive   Destruction method: electrodesiccation and curettage   Informed consent: discussed and consent obtained   Timeout:  patient name, date of birth, surgical site, and procedure verified Procedure prep:  Patient was prepped and draped in usual sterile fashion Prep type:  Isopropyl alcohol Anesthesia: the lesion was anesthetized in a standard fashion   Anesthetic:  1% lidocaine w/ epinephrine 1-100,000 buffered w/ 8.4% NaHCO3 Curettage performed in three different directions: Yes   Electrodesiccation performed over the curetted area: Yes   Lesion length (cm):  1 Lesion width (cm):  1 Margin per side (cm):  0.2 Final wound size (cm):  1.4 Hemostasis achieved with:  pressure, aluminum chloride and electrodesiccation Outcome: patient tolerated procedure well with no complications   Post-procedure details: sterile dressing applied and wound care instructions given   Dressing type: bandage and bacitracin    Specimen 1 - Surgical pathology Differential Diagnosis: D48.5 R/O BCC vs SCC Check  Margins: No 1.0cm Crust EDC today  Return in about 3 months (around 01/29/2021) for AK f/u.  I, Othelia Pulling, RMA, am acting as scribe for Sarina Ser, MD .  Documentation: I have reviewed the above documentation for accuracy and completeness, and I agree  with the above.  Sarina Ser, MD

## 2020-10-30 ENCOUNTER — Encounter: Payer: Self-pay | Admitting: Dermatology

## 2020-11-02 ENCOUNTER — Telehealth: Payer: Self-pay

## 2020-11-02 NOTE — Telephone Encounter (Signed)
Left message for patient to call office for results/hd 

## 2020-11-02 NOTE — Telephone Encounter (Signed)
-----   Message from Ralene Bathe, MD sent at 10/30/2020  5:14 PM EDT ----- Diagnosis Skin , r post base of skull/neck BASAL CELL CARCINOMA, SUPERFICIAL AND NODULAR PATTERNS  Cancer - BCC Already treated Recheck next visit

## 2020-11-03 NOTE — Telephone Encounter (Signed)
Patient came into office and advised of biopsy results.

## 2021-01-12 ENCOUNTER — Other Ambulatory Visit: Payer: Self-pay

## 2021-01-12 ENCOUNTER — Emergency Department
Admission: EM | Admit: 2021-01-12 | Discharge: 2021-01-12 | Disposition: A | Discharge status: Home / Self Care | Payer: Medicare Other | Attending: Emergency Medicine | Admitting: Emergency Medicine

## 2021-01-12 ENCOUNTER — Emergency Department: Payer: Medicare Other

## 2021-01-12 DIAGNOSIS — Z79899 Other long term (current) drug therapy: Secondary | ICD-10-CM | POA: Insufficient documentation

## 2021-01-12 DIAGNOSIS — I471 Supraventricular tachycardia: Secondary | ICD-10-CM | POA: Diagnosis not present

## 2021-01-12 DIAGNOSIS — I2510 Atherosclerotic heart disease of native coronary artery without angina pectoris: Secondary | ICD-10-CM | POA: Diagnosis not present

## 2021-01-12 DIAGNOSIS — I4891 Unspecified atrial fibrillation: Secondary | ICD-10-CM | POA: Diagnosis not present

## 2021-01-12 DIAGNOSIS — I1310 Hypertensive heart and chronic kidney disease without heart failure, with stage 1 through stage 4 chronic kidney disease, or unspecified chronic kidney disease: Secondary | ICD-10-CM | POA: Diagnosis not present

## 2021-01-12 DIAGNOSIS — R000 Tachycardia, unspecified: Secondary | ICD-10-CM | POA: Diagnosis present

## 2021-01-12 DIAGNOSIS — I25810 Atherosclerosis of coronary artery bypass graft(s) without angina pectoris: Secondary | ICD-10-CM | POA: Insufficient documentation

## 2021-01-12 DIAGNOSIS — N189 Chronic kidney disease, unspecified: Secondary | ICD-10-CM | POA: Diagnosis not present

## 2021-01-12 DIAGNOSIS — Z85828 Personal history of other malignant neoplasm of skin: Secondary | ICD-10-CM | POA: Insufficient documentation

## 2021-01-12 DIAGNOSIS — Z7901 Long term (current) use of anticoagulants: Secondary | ICD-10-CM | POA: Insufficient documentation

## 2021-01-12 LAB — COMPREHENSIVE METABOLIC PANEL
ALT: 26 U/L (ref 0–44)
AST: 27 U/L (ref 15–41)
Albumin: 3.9 g/dL (ref 3.5–5.0)
Alkaline Phosphatase: 43 U/L (ref 38–126)
Anion gap: 11 (ref 5–15)
BUN: 20 mg/dL (ref 8–23)
CO2: 27 mmol/L (ref 22–32)
Calcium: 8.9 mg/dL (ref 8.9–10.3)
Chloride: 91 mmol/L — ABNORMAL LOW (ref 98–111)
Creatinine, Ser: 1.39 mg/dL — ABNORMAL HIGH (ref 0.61–1.24)
GFR, Estimated: 51 mL/min — ABNORMAL LOW (ref >60)
Glucose, Bld: 124 mg/dL — ABNORMAL HIGH (ref 70–99)
Potassium: 3.4 mmol/L — ABNORMAL LOW (ref 3.5–5.1)
Sodium: 129 mmol/L — ABNORMAL LOW (ref 135–145)
Total Bilirubin: 0.6 mg/dL (ref 0.3–1.2)
Total Protein: 7.3 g/dL (ref 6.5–8.1)

## 2021-01-12 LAB — PROTIME-INR
INR: 1.5 — ABNORMAL HIGH (ref 0.8–1.2)
Prothrombin Time: 17.8 seconds — ABNORMAL HIGH (ref 11.4–15.2)

## 2021-01-12 LAB — CBC WITH DIFFERENTIAL/PLATELET
Abs Immature Granulocytes: 0 10*3/uL (ref 0.00–0.07)
Basophils Absolute: 0 10*3/uL (ref 0.0–0.1)
Basophils Relative: 0 %
Eosinophils Absolute: 0 10*3/uL (ref 0.0–0.5)
Eosinophils Relative: 0 %
HCT: 42.5 % (ref 39.0–52.0)
Hemoglobin: 15.1 g/dL (ref 13.0–17.0)
Immature Granulocytes: 0 %
Lymphocytes Relative: 14 %
Lymphs Abs: 0.5 10*3/uL — ABNORMAL LOW (ref 0.7–4.0)
MCH: 33.3 pg (ref 26.0–34.0)
MCHC: 35.5 g/dL (ref 30.0–36.0)
MCV: 93.6 fL (ref 80.0–100.0)
Monocytes Absolute: 0.6 10*3/uL (ref 0.1–1.0)
Monocytes Relative: 16 %
Neutro Abs: 2.5 10*3/uL (ref 1.7–7.7)
Neutrophils Relative %: 70 %
Platelets: 191 10*3/uL (ref 150–400)
RBC: 4.54 MIL/uL (ref 4.22–5.81)
RDW: 12.2 % (ref 11.5–15.5)
WBC: 3.5 10*3/uL — ABNORMAL LOW (ref 4.0–10.5)
nRBC: 0 % (ref 0.0–0.2)

## 2021-01-12 LAB — TROPONIN I (HIGH SENSITIVITY): Troponin I (High Sensitivity): 68 ng/L — ABNORMAL HIGH (ref <18)

## 2021-01-12 LAB — TSH: TSH: 4.524 u[IU]/mL — ABNORMAL HIGH (ref 0.350–4.500)

## 2021-01-12 NOTE — ED Provider Notes (Signed)
[facility] Emergency Department Provider Note   ____________________________________________   Event Date/Time   First MD Initiated Contact with Patient 01/12/21 1549     (approximate)  I have reviewed the triage vital signs and the nursing notes.   HISTORY  Chief Complaint Atrial Fibrillation   HPI Robert Rivas is a 80 y.o. male with a past history of atrial fibrillation who was in his usual state of health and went to take his blood pressure noticed that he was tachycardic up to 165.  He has not been having palpitations or chest pain or shortness of breath.  He went to [place] [facility] for check and they sent him here immediately.  Here he again was not having any chest pain or shortness of breath or any other symptoms.  He spontaneously converted on IV placement.         Past Medical History:  Diagnosis Date  . A-fib ([place])   . Arthritis    lower back  . Basal cell carcinoma 10/29/2020   Right post base of skull/neck  . BPH (benign prostatic hyperplasia)   . Cancer ([place])    Skin Cancer  . Chronic kidney disease    Chronic Kidney Disease  . Coronary artery disease   . Dysrhythmia    Atrial Fibrillation; Supraventricular Tachycardia  . Gout   . Hearing aid worn    has, does not wear  . 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 Hyperlipidemia   . Hypertension   . MI, old   . Nocturia   . Phlebitis   . 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 Squamous cell carcinoma of skin 03/26/2018   SCC IS R forearm  . Squamous cell carcinoma of skin 01/01/2008   SCC IS R forearm  . Wears dentures    partial    Patient Active Problem List   Diagnosis Date Noted  . Bradycardia 07/08/2019  . Venous insufficiency of both lower extremities 07/08/2019  . Abnormal glucose 01/10/2019   . Elevated PSA 01/10/2019  . 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 Palpitations 06/13/2018  . Leg edema, right 09/20/2017  . History of BPH 02/21/2017  . BPH with obstruction/lower urinary tract symptoms 04/25/2016  . Nocturia 04/25/2016  . Gout 03/25/2016  . History of colon polyps 03/25/2016  . Inflammation of a vein 03/25/2016  . Percutaneous transluminal coronary angioplasty status 03/25/2016  . Supraventricular tachycardia ([place]) 03/25/2016  . Benign essential HTN 06/10/2015  . Paroxysmal atrial fibrillation ([place]) 01/20/2015  . Atrial fibrillation ([place]) 05/13/2014  . 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 Chest pain 05/13/2014  . Chronic kidney disease 05/13/2014  . Essential (primary) hypertension 05/13/2014  . History of cardiac catheterization 05/13/2014  . HLD (hyperlipidemia) 05/13/2014  . Combined fat and carbohydrate induced hyperlipemia 05/13/2014  . History of cardiovascular surgery 05/13/2014  . History of anticoagulant therapy 04/28/2014  . Long term current use of anticoagulant 04/28/2014  . Benign prostatic hyperplasia with urinary obstruction 11/05/2013    Past Surgical History:  Procedure Laterality Date  . cardaic stents    . CARDIAC CATHETERIZATION     PTCA with Stent Placement  . COLONOSCOPY WITH PROPOFOL N/A 12/15/2015   Procedure: COLONOSCOPY WITH PROPOFOL;  Surgeon: Billie Ruddy  Gustavo Lah, MD;  Location: [facility] ENDOSCOPY;  Service: Endoscopy;  Laterality: N/A;  . CORONARY ARTERY BYPASS GRAFT  1992  . ECTROPION REPAIR Right 12/12/2017   Procedure: REPAIR OF ECTROPION SUTURES/EXTENSIVE RIGHT;  Surgeon: Karle Starch, MD;  Location: [place];  Service: Ophthalmology;  Laterality: Right;  . HERNIA REPAIR    . SVT ABLATION  02/22/2017   [facility]    Prior to Admission medications   Medication Sig Start Date End Date Taking? Authorizing Provider  ezetimibe (ZETIA) 10 MG tablet Take 10 mg by mouth at bedtime.     [provider]  isosorbide dinitrate (ISORDIL) 10 MG tablet Take 30 mg by mouth 2 (two) times daily.    [provider]  metoprolol tartrate (LOPRESSOR) 25 MG tablet Take 1 tablet (25 mg total) by mouth every 30 (thirty) minutes as needed (palpitations/rapid heart rate). If you have taken 2 doses and are still having palpitations/symptoms.  Call your cardiologist or go immediately to the emergency department. Patient taking differently: Take 25 mg by mouth every 30 (thirty) minutes as needed (palpitations/rapid heart rate). Take 1.5 tablets twice daily 10/16/16 12/05/17  Orbie Pyo, MD  montelukast (SINGULAIR) 10 MG tablet Take by mouth. 01/27/17 01/27/18  [provider]  nitroGLYCERIN (NITROSTAT) 0.4 MG SL tablet  04/09/14   [provider]  olmesartan (BENICAR) 20 MG tablet TAKE 1 TABLET BY MOUTH ONCE DAILY 10/24/19   [provider]  probenecid (BENEMID) 500 MG tablet Take by mouth. 10/01/19 09/30/20  [provider]  simvastatin (ZOCOR) 20 MG tablet Take by mouth. 04/24/19   [provider]  SYRINGE-NEEDLE, DISP, 3 ML (BD INTE[employer]RA SYRINGE) 25G X 1" 3 ML MISC Use 1 Syringe monthly. 05/07/15   [provider]  warfarin (COUMADIN) 2 MG tablet Take by mouth. 04/24/19   [provider]    Allergies Codeine and Tizanidine  Family History  Problem Relation Age of Onset  . Kidney cancer Maternal Uncle   . Prostate cancer Neg Hx   . Bladder Cancer Neg Hx     Social History Social History   Tobacco Use  . Smoking status: Never Smoker  . Smokeless tobacco: Never Used  Vaping Use  . Vaping Use: Never used  Substance Use Topics  . Alcohol use: No  . Drug use: No    Review of Systems  Constitutional: No fever/chills Eyes: No visual changes. ENT: No sore throat. Cardiovascular: Denies chest pain. Respiratory: Denies shortness of breath. Gastrointestinal: No abdominal pain.  No nausea, no vomiting.  No diarrhea.  No  constipation. Genitourinary: Negative for dysuria. Musculoskeletal: Negative for back pain. Skin: Negative for rash. Neurological: Negative for headaches, focal weakness   ____________________________________________   PHYSICAL EXAM:  VITAL SIGNS: ED Triage Vitals  Enc Vitals Group     BP 01/12/21 1551 107/79     Pulse Rate 01/12/21 1544 (!) 135     Resp 01/12/21 1544 18     Temp 01/12/21 1544 97.8 F (36.6 C)     Temp Source 01/12/21 1544 Oral     SpO2 01/12/21 1544 100 %     Weight 01/12/21 1547 205 lb (93 kg)     Height 01/12/21 1547 6\' 1"  (1.854 m)     Head Circumference --      Peak Flow --      Pain Score --      Pain Loc --      Pain Edu? --  Excl. in GC? --     Constitutional: Alert and oriented. Well appearing and in no acute distress. Eyes: Conjunctivae are normal.  Head: Atraumatic. Nose: No congestion/rhinnorhea. Mouth/Throat: Mucous membranes are moist.  Oropharynx non-erythematous. Neck: No stridor.   Cardiovascular: When I examined him: Normal rate, regular rhythm. Grossly normal heart sounds.  Good peripheral circulation. Respiratory: Normal respiratory effort.  No retractions. Lungs CTAB. Gastrointestinal: Soft and nontender. No distention. No abdominal bruits. No CVA tenderness. Musculoskeletal: No lower extremity tenderness nor edema.   Neurologic:  Normal speech and language. No gross focal neurologic deficits are appreciated. No gait instability. Skin:  Skin is warm, dry and intact. No rash noted.   ____________________________________________   LABS (all labs ordered are listed, but only abnormal results are displayed)  Labs Reviewed  COMPREHENSIVE METABOLIC PANEL - Abnormal; Notable for the following components:      Result Value   Sodium 129 (*)    Potassium 3.4 (*)    Chloride 91 (*)    Glucose, Bld 124 (*)    Creatinine, Ser 1.39 (*)    GFR, Estimated 51 (*)    All other components within normal limits  CBC WITH  DIFFERENTIAL/PLATELET - Abnormal; Notable for the following components:   WBC 3.5 (*)    Lymphs Abs 0.5 (*)    All other components within normal limits  PROTIME-INR - Abnormal; Notable for the following components:   Prothrombin Time 17.8 (*)    INR 1.5 (*)    All other components within normal limits  TSH - Abnormal; Notable for the following components:   TSH 4.524 (*)    All other components within normal limits  TROPONIN I (HIGH SENSITIVITY) - Abnormal; Notable for the following components:   Troponin I (High Sensitivity) 68 (*)    All other components within normal limits  TROPONIN I (HIGH SENSITIVITY)   ____________________________________________  EKG  EKG #1 read interpreted by me shows SVT at 156 normal axis likely rate related changes  EKG #2 read interpreted by me shows trigeminy normal axis resolution of ST T changes EKG #3 read interpreted by me shows normal sinus rhythm with 2 PVCs normal axis essentially normal EKG QRS is prolonged at 116 ms ____________________________________________  RADIOLOGY Jill Poling, personally viewed and evaluated these images (plain radiographs) as part of my medical decision making, as well as reviewing the written report by the radiologist.  ED MD interpretation: Chest x-ray read and interpreted by radiology shows no acute disease I reviewed the film there may be a little bit of CHF and there.  It was done just as he was coming out of his tachycardia.  Official radiology report(s): DG Chest Portable 1 View  Result Date: 01/12/2021 CLINICAL DATA:  Palpitations. EXAM: PORTABLE CHEST 1 VIEW COMPARISON:  10/16/2016 FINDINGS: Normal heart size. Previous median sternotomy and CABG procedure. No pleural effusion or edema. No airspace densities. IMPRESSION: No active disease. Electronically Signed   By: Signa Kell M.D.   On: 01/12/2021 16:29    ____________________________________________   PROCEDURES  Procedure(s) performed  (including Critical Care):  Procedures   ____________________________________________   INITIAL IMPRESSION / ASSESSMENT AND PLAN / ED COURSE  Patient with apparent SVT.  He is also slightly hyponatremic.  His troponin is slightly elevated probably due to rate related changes.  His GFR is within normal limits for him.  I will discuss him with cardiology.  His outpatient record does not show that he supposed to take  metoprolol for tachycardia but the record as it came through the ER history and physical does say that.    ----------------------------------------- 6:11 PM on 01/12/2021 -----------------------------------------  Patient has been stable this whole time.  He never had any symptoms.  I was going to contact Dr. Clayborn Bigness and ask him about the discrepancy between the patient's preprinted available ER med list of home medicines and what is on his clinic [facility] clinic list.  I am unable to contact Dr. Clayborn Bigness right now.  We will have the patient follow-up with his [facility] clinic cardiologist.  It is possible that they might want to restart metoprolol for rapid heartbeat if he is not indeed having the metoprolol as he thinks and his [facility] clinic med list needs me to think.  Patient will return if he has any further symptoms.          ____________________________________________   FINAL CLINICAL IMPRESSION(S) / ED DIAGNOSES  Final diagnoses:  SVT (supraventricular tachycardia) [facility])     ED Discharge Orders    None      *Please note:  Robert Rivas was evaluated in Emergency Department on 01/12/2021 for the symptoms described in the history of present illness. He was evaluated in the context of the global COVID-19 pandemic, which necessitated consideration that the patient might be at risk for infection with the SARS-CoV-2 virus that causes COVID-19. Institutional protocols and algorithms that pertain to the evaluation of patients at risk for COVID-19 are in a state of  rapid change based on information released by regulatory bodies including the CDC and federal and state organizations. These policies and algorithms were followed during the patient's care in the ED.  Some ED evaluations and interventions may be delayed as a result of limited staffing during and the pandemic.*   Note:  This document was prepared using Dragon voice recognition software and may include unintentional dictation errors.    Nena Polio, MD 01/12/21 [phone]

## 2021-01-12 NOTE — ED Triage Notes (Signed)
Pt arrives via POV from [facility] with complaint of tachycardia. Pt checked his BP at home and noticed his HR was in the 160's. Pt went to [facility], where they referred him here  Pt is a/o, ambulatory. Does report fatigue/weaker than normal   Has had this happen before

## 2021-01-12 NOTE — ED Notes (Addendum)
Pt reports this AM he was feeling weaker/more tired than normal. Pt with relatively extensive cardiac hx, including a fib and quadruple bypass. Pt arrives with HR between 140-160 a fib rhythm, by the time pt put in room, HR 115-135  At this time (approx 10 min later) HR 74-88, mostly regular.   Pt reports he has taken his AM meds as normal   Did not take lunchtime med (benicar)

## 2021-01-12 NOTE — Discharge Instructions (Addendum)
Please return if your pulse rate goes up very high again.  Also return for chest pain or shortness of breath.  Please call your cardiologist to [facility] clinic and have them follow-up with you in the next several days.

## 2021-02-11 ENCOUNTER — Ambulatory Visit: Payer: Medicare Other | Admitting: Dermatology

## 2021-04-12 ENCOUNTER — Other Ambulatory Visit: Payer: Self-pay

## 2021-04-12 ENCOUNTER — Emergency Department: Payer: Medicare Other

## 2021-04-12 ENCOUNTER — Inpatient Hospital Stay
Admission: EM | Admit: 2021-04-12 | Discharge: 2021-04-13 | DRG: 308 | Disposition: A | Discharge status: Home / Self Care | Payer: Medicare Other | Attending: Internal Medicine | Admitting: Internal Medicine

## 2021-04-12 ENCOUNTER — Encounter: Payer: Self-pay | Admitting: Intensive Care

## 2021-04-12 ENCOUNTER — Other Ambulatory Visit: Admission: RE | Admit: 2021-04-12 | Payer: Medicare Other | Source: Ambulatory Visit

## 2021-04-12 DIAGNOSIS — Z7901 Long term (current) use of anticoagulants: Secondary | ICD-10-CM | POA: Diagnosis not present

## 2021-04-12 DIAGNOSIS — Z86006 Personal history of melanoma in-situ: Secondary | ICD-10-CM | POA: Diagnosis not present

## 2021-04-12 DIAGNOSIS — Z955 Presence of coronary angioplasty implant and graft: Secondary | ICD-10-CM

## 2021-04-12 DIAGNOSIS — Z79899 Other long term (current) drug therapy: Secondary | ICD-10-CM

## 2021-04-12 DIAGNOSIS — I252 Old myocardial infarction: Secondary | ICD-10-CM | POA: Diagnosis not present

## 2021-04-12 DIAGNOSIS — N179 Acute kidney failure, unspecified: Secondary | ICD-10-CM | POA: Diagnosis present

## 2021-04-12 DIAGNOSIS — I2510 Atherosclerotic heart disease of native coronary artery without angina pectoris: Secondary | ICD-10-CM | POA: Diagnosis present

## 2021-04-12 DIAGNOSIS — N1832 Chronic kidney disease, stage 3b: Secondary | ICD-10-CM | POA: Diagnosis present

## 2021-04-12 DIAGNOSIS — I4819 Other persistent atrial fibrillation: Principal | ICD-10-CM | POA: Diagnosis present

## 2021-04-12 DIAGNOSIS — E785 Hyperlipidemia, unspecified: Secondary | ICD-10-CM | POA: Diagnosis present

## 2021-04-12 DIAGNOSIS — I4891 Unspecified atrial fibrillation: Secondary | ICD-10-CM | POA: Diagnosis present

## 2021-04-12 DIAGNOSIS — I5031 Acute diastolic (congestive) heart failure: Secondary | ICD-10-CM | POA: Diagnosis present

## 2021-04-12 DIAGNOSIS — Z20822 Contact with and (suspected) exposure to covid-19: Secondary | ICD-10-CM | POA: Diagnosis present

## 2021-04-12 DIAGNOSIS — Z85828 Personal history of other malignant neoplasm of skin: Secondary | ICD-10-CM

## 2021-04-12 DIAGNOSIS — R001 Bradycardia, unspecified: Secondary | ICD-10-CM | POA: Diagnosis not present

## 2021-04-12 DIAGNOSIS — Z8672 Personal history of thrombophlebitis: Secondary | ICD-10-CM

## 2021-04-12 DIAGNOSIS — I130 Hypertensive heart and chronic kidney disease with heart failure and stage 1 through stage 4 chronic kidney disease, or unspecified chronic kidney disease: Secondary | ICD-10-CM | POA: Diagnosis present

## 2021-04-12 DIAGNOSIS — N400 Enlarged prostate without lower urinary tract symptoms: Secondary | ICD-10-CM | POA: Diagnosis present

## 2021-04-12 DIAGNOSIS — I248 Other forms of acute ischemic heart disease: Secondary | ICD-10-CM | POA: Diagnosis present

## 2021-04-12 DIAGNOSIS — R0600 Dyspnea, unspecified: Secondary | ICD-10-CM

## 2021-04-12 DIAGNOSIS — E871 Hypo-osmolality and hyponatremia: Secondary | ICD-10-CM | POA: Diagnosis present

## 2021-04-12 DIAGNOSIS — Z951 Presence of aortocoronary bypass graft: Secondary | ICD-10-CM | POA: Diagnosis not present

## 2021-04-12 DIAGNOSIS — E538 Deficiency of other specified B group vitamins: Secondary | ICD-10-CM | POA: Diagnosis present

## 2021-04-12 LAB — COMPREHENSIVE METABOLIC PANEL
ALT: 25 U/L (ref 0–44)
AST: 23 U/L (ref 15–41)
Albumin: 4.1 g/dL (ref 3.5–5.0)
Alkaline Phosphatase: 36 U/L — ABNORMAL LOW (ref 38–126)
Anion gap: 9 (ref 5–15)
BUN: 31 mg/dL — ABNORMAL HIGH (ref 8–23)
CO2: 25 mmol/L (ref 22–32)
Calcium: 9.3 mg/dL (ref 8.9–10.3)
Chloride: 100 mmol/L (ref 98–111)
Creatinine, Ser: 1.67 mg/dL — ABNORMAL HIGH (ref 0.61–1.24)
GFR, Estimated: 41 mL/min — ABNORMAL LOW (ref >60)
Glucose, Bld: 113 mg/dL — ABNORMAL HIGH (ref 70–99)
Potassium: 4 mmol/L (ref 3.5–5.1)
Sodium: 134 mmol/L — ABNORMAL LOW (ref 135–145)
Total Bilirubin: 1 mg/dL (ref 0.3–1.2)
Total Protein: 6.9 g/dL (ref 6.5–8.1)

## 2021-04-12 LAB — CBC
HCT: 36.2 % — ABNORMAL LOW (ref 39.0–52.0)
Hemoglobin: 12.7 g/dL — ABNORMAL LOW (ref 13.0–17.0)
MCH: 33.4 pg (ref 26.0–34.0)
MCHC: 35.1 g/dL (ref 30.0–36.0)
MCV: 95.3 fL (ref 80.0–100.0)
Platelets: 181 10*3/uL (ref 150–400)
RBC: 3.8 MIL/uL — ABNORMAL LOW (ref 4.22–5.81)
RDW: 12.8 % (ref 11.5–15.5)
WBC: 6.7 10*3/uL (ref 4.0–10.5)
nRBC: 0 % (ref 0.0–0.2)

## 2021-04-12 LAB — RESP PANEL BY RT-PCR (FLU A&B, COVID) ARPGX2
Influenza A by PCR: NEGATIVE
Influenza B by PCR: NEGATIVE
SARS Coronavirus 2 by RT PCR: NEGATIVE

## 2021-04-12 LAB — TROPONIN I (HIGH SENSITIVITY)
Troponin I (High Sensitivity): 197 ng/L (ref <18)
Troponin I (High Sensitivity): 192 ng/L (ref <18)
Troponin I (High Sensitivity): 188 ng/L (ref <18)

## 2021-04-12 LAB — PROTIME-INR
INR: 3.1 — ABNORMAL HIGH (ref 0.8–1.2)
Prothrombin Time: 32 seconds — ABNORMAL HIGH (ref 11.4–15.2)

## 2021-04-12 LAB — BRAIN NATRIURETIC PEPTIDE: B Natriuretic Peptide: 537.2 pg/mL — ABNORMAL HIGH (ref 0.0–100.0)

## 2021-04-12 MED ORDER — ASPIRIN 300 MG RE SUPP: 300.0000 mg | RECTAL | Status: AC

## 2021-04-12 MED ORDER — WARFARIN SODIUM 4 MG PO TABS
4.0000 mg | ORAL_TABLET | Freq: Once | ORAL | Status: AC
  Administered 2021-04-12: 4 mg via ORAL
  Filled 2021-04-12: qty 1

## 2021-04-12 MED ORDER — ONDANSETRON HCL 4 MG/2ML IJ SOLN
4.0000 mg | Freq: Once | INTRAMUSCULAR | Status: AC
  Administered 2021-04-12: 4 mg via INTRAVENOUS
  Filled 2021-04-12: qty 2

## 2021-04-12 MED ORDER — ASPIRIN EC 81 MG PO TBEC: 81.0000 mg | DELAYED_RELEASE_TABLET | Freq: Every day | ORAL | Status: DC

## 2021-04-12 MED ORDER — WARFARIN - PHARMACIST DOSING INPATIENT
Freq: Every day | Status: DC
  Filled 2021-04-12: qty 1

## 2021-04-12 MED ORDER — NITROGLYCERIN 0.4 MG SL SUBL: 0.4000 mg | SUBLINGUAL_TABLET | SUBLINGUAL | Status: DC | PRN

## 2021-04-12 MED ORDER — ACETAMINOPHEN 325 MG PO TABS: 650.0000 mg | ORAL_TABLET | ORAL | Status: DC | PRN

## 2021-04-12 MED ORDER — DILTIAZEM HCL 25 MG/5ML IV SOLN
10.0000 mg | Freq: Once | INTRAVENOUS | Status: AC
  Administered 2021-04-12: 10 mg via INTRAVENOUS
  Filled 2021-04-12: qty 5

## 2021-04-12 MED ORDER — DILTIAZEM HCL-DEXTROSE 125-5 MG/125ML-% IV SOLN (PREMIX)
5.0000 mg/h | INTRAVENOUS | Status: DC
  Administered 2021-04-12: 5 mg/h via INTRAVENOUS
  Administered 2021-04-13: 15 mg/h via INTRAVENOUS
  Filled 2021-04-12 (×3): qty 125

## 2021-04-12 MED ORDER — ONDANSETRON HCL 4 MG/2ML IJ SOLN: 4.0000 mg | Freq: Four times a day (QID) | INTRAMUSCULAR | Status: DC | PRN

## 2021-04-12 MED ORDER — ASPIRIN 81 MG PO CHEW
324.0000 mg | CHEWABLE_TABLET | ORAL | Status: AC
  Administered 2021-04-12: 324 mg via ORAL
  Filled 2021-04-12: qty 4

## 2021-04-12 NOTE — ED Notes (Signed)
Patient ambulatory to bathroom at this time

## 2021-04-12 NOTE — Consult Note (Signed)
Cardiology Consultation Note    Patient ID: Robert Rivas, MRN: [id number], DOB/AGE: [birth year] 81 y.o. Admit date: 04/12/2021   Date of Consult: 04/12/2021 Primary Physician: Robert Crouch, MD Primary Cardiologist: Dr. Nehemiah Massed  Chief Complaint: Shortness of breath and rapid heart rate Reason for Consultation: Atrial fibrillation Requesting MD: Dr. Nevada Crane  HPI: Robert Rivas is a 80 y.o. male with history of paroxysmal atrial fibrillation on chronic warfarin, also history of hypertension hyperlipidemia who was scheduled for elective cardioversion in 2 days as an outpatient.  He noted worsening shortness of breath and rapid heart rate.  Currently is hemodynamically stable with A. fib with a ventricular rate between 101 120.  Oxygen sats are normal.  Creatinine is slightly elevated at 1.67 with a baseline of 1.3.  BNP is 537.  He denies chest pain.  As an outpatient he is on Zetia, warfarin, hydrochlorothiazide, metoprolol tartrate 50 mg twice daily and olmesartan.  Potassium is 4.0.  Troponins are flat at 197.192.  Chest x-ray revealed no active cardiopulmonary disease.  Past Medical History:  Diagnosis Date  . A-fib ([place])   . Arthritis    lower back  . Basal cell carcinoma 10/29/2020   Right post base of skull/neck  . BPH (benign prostatic hyperplasia)   . Cancer ([place])    Skin Cancer  . Chronic kidney disease    Chronic Kidney Disease  . Coronary artery disease   . Dysrhythmia    Atrial Fibrillation; Supraventricular Tachycardia  . Gout   . Hearing aid worn    has, does not wear  . 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 Hyperlipidemia   . Hypertension   . MI, old   . Nocturia   . Phlebitis   . 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 Squamous cell carcinoma of skin 03/26/2018   SCC IS R forearm  .  Squamous cell carcinoma of skin 01/01/2008   SCC IS R forearm  . Wears dentures    partial      Surgical History:  Past Surgical History:  Procedure Laterality Date  . cardaic stents    . CARDIAC CATHETERIZATION     PTCA with Stent Placement  . COLONOSCOPY WITH PROPOFOL N/A 12/15/2015   Procedure: COLONOSCOPY WITH PROPOFOL;  Surgeon: Lollie Sails, MD;  Location: [facility] ENDOSCOPY;  Service: Endoscopy;  Laterality: N/A;  . CORONARY ARTERY BYPASS GRAFT  1992  . ECTROPION REPAIR Right 12/12/2017   Procedure: REPAIR OF ECTROPION SUTURES/EXTENSIVE RIGHT;  Surgeon: Karle Starch, MD;  Location: [place];  Service: Ophthalmology;  Laterality: Right;  . HERNIA REPAIR    . SVT ABLATION  02/22/2017   [facility]     Home Meds: Prior to Admission medications   Medication Sig Start Date End Date Taking? Authorizing Provider  docusate sodium (COLACE) 100 MG capsule Take 100 mg by mouth daily.   Yes [provider]  dronedarone (MULTAQ) 400 MG tablet Take 400 mg by mouth 2 (two) times daily with a meal.   Yes [provider]  ezetimibe (ZETIA) 10 MG tablet Take 10 mg by mouth at bedtime.    Yes [provider]  hydrochlorothiazide (HYDRODIURIL) 12.5 MG tablet Take 12.5 mg by mouth daily. 01/06/21  Yes [provider]  HYDROCODONE-ACETAMINOPHEN PO HYDROCODONE-ACETAMINOPHEN (HYDROCODONE/ACETAMINOPHEN), 5MG-325MG, TABLET, ORAL, M[place]RODT PH, 50 Start Date: 01/18/20 Status: Ordered 01/18/20  Yes [provider]  metoprolol tartrate (LOPRESSOR) 50 MG tablet Take 50 mg by mouth 2 (two) times daily. 04/06/21  Yes [provider]  montelukast (SINGULAIR) 10 MG tablet Take 10 mg by mouth at bedtime. 08/12/16  Yes [provider]  nitroGLYCERIN (NITROSTAT) 0.4 MG SL tablet Place 0.4 mg under the tongue every 5 (five) minutes as needed for chest pain. 04/09/14  Yes [provider]  olmesartan (BENICAR) 20 MG tablet olmesartan 20 mg  oral tablet Start Date: 12/11/20 Status: Ordered 12/11/20  Yes [provider]  olmesartan (BENICAR) 40 MG tablet Take 40 mg by mouth 2 (two) times daily. 10/24/19  Yes [provider]  probenecid (BENEMID) 500 MG tablet Take 500 mg by mouth daily. 06/14/14  Yes [provider]  tamsulosin (FLOMAX) 0.4 MG CAPS capsule Take 0.4 mg by mouth daily. 01/28/21  Yes [provider]  traMADol (ULTRAM) 50 MG tablet traMADol 50 mg oral tablet Start Date: 09/29/19 Status: Ordered 09/29/19  Yes [provider]  warfarin (COUMADIN) 2 MG tablet Take 4 mg by mouth at bedtime. 04/24/19  Yes [provider]  Cyanocobalamin (B-12 COMPLIANCE INJECTION) 1000 MCG/ML KIT Inject 1,000 mcg as directed every 30 (thirty) days.    [provider]  metoprolol tartrate (LOPRESSOR) 25 MG tablet Take 1 tablet (25 mg total) by mouth every 30 (thirty) minutes as needed (palpitations/rapid heart rate). If you have taken 2 doses and are still having palpitations/symptoms.  Call your cardiologist or go immediately to the emergency department. Patient taking differently: Take 50 mg by mouth 2 (two) times daily. 10/16/16 12/05/17  Orbie Pyo, MD  montelukast (SINGULAIR) 10 MG tablet Take 10 mg by mouth at bedtime. 01/27/17 04/08/21  [provider]  probenecid (BENEMID) 500 MG tablet Take 500 mg by mouth daily. 10/01/19 04/08/21  [provider]  simvastatin (ZOCOR) 20 MG tablet Take 20 mg by mouth at bedtime. Patient not taking: No sig reported 04/24/19   [provider]  SYRINGE-NEEDLE, DISP, 3 ML 25G X 1" 3 ML MISC Use 1 Syringe monthly. 05/07/15   [provider]    Inpatient Medications:  . [START ON 04/13/2021] aspirin EC  81 mg Oral Daily  . warfarin  4 mg Oral ONCE-1600  . Warfarin - Pharmacist Dosing Inpatient   Does not apply q1600   . diltiazem (CARDIZEM) infusion Stopped (04/12/21 1519)    Allergies:  Allergies   Allergen Reactions  . Codeine Nausea And Vomiting and Nausea Only  . Tizanidine Other (See Comments)    drowsiness    Social History   Socioeconomic History  . Marital status: Married    Spouse name: Not on file  . Number of children: Not on file  . Years of education: Not on file  . Highest education level: Not on file  Occupational History  . Not on file  Tobacco Use  . Smoking status: Never Smoker  . Smokeless tobacco: Never Used  Vaping Use  . Vaping Use: Never used  Substance and Sexual Activity  . Alcohol use: No  . Drug use: No  . Sexual activity: Not on file  Other Topics Concern  . Not on file  Social History Narrative  . Not on file   Social Determinants of Health   Financial Resource Strain: Not on file  Food Insecurity: Not on file  Transportation Needs: Not on file  Physical Activity: Not on file  Stress: Not on file  Social Connections: Not on file  Intimate Partner Violence: Not on file     Family History  Problem Relation Age of Onset  . Kidney cancer Maternal Uncle   . Prostate cancer Neg Hx   . Bladder Cancer Neg Hx      Review of Systems: A 12-system review of systems was performed and is negative except as noted in the HPI.  Labs: No results for input(s): CKTOTAL, CKMB, TROPONINI in the last 72 hours. Lab Results  Component Value Date   WBC 6.7 04/12/2021   HGB 12.7 (L) 04/12/2021   HCT 36.2 (L) 04/12/2021   MCV 95.3 04/12/2021   PLT 181 04/12/2021    Recent Labs  Lab 04/12/21 1237  NA 134*  K 4.0  CL 100  CO2 25  BUN 31*  CREATININE 1.67*  CALCIUM 9.3  PROT 6.9  BILITOT 1.0  ALKPHOS 36*  ALT 25  AST 23  GLUCOSE 113*   No results found for: CHOL, HDL, LDLCALC, TRIG No results found for: DDIMER  Radiology/Studies:  DG Chest Portable 1 View  Result Date: 04/12/2021 CLINICAL DATA:  Shortness of breath. EXAM: PORTABLE CHEST 1 VIEW COMPARISON:  January 12, 2021. FINDINGS: The heart size and mediastinal contours are  within normal limits. Status post coronary bypass graft. No pneumothorax or pleural effusion is noted. Both lungs are clear. The visualized skeletal structures are unremarkable. IMPRESSION: No active disease. Electronically Signed   By: Marijo Conception M.D.   On: 04/12/2021 12:42    Wt Readings from Last 3 Encounters:  04/12/21 94.8 kg  01/12/21 93 kg  05/07/20 92.5 kg    EKG: Atrial fibrillation with rapid ventricular response  Physical Exam:  Blood pressure 131/86, pulse 70, temperature 98.3 F (36.8 C), temperature source Oral, resp. rate 17, height 6' 1"  (1.854 m), weight 94.8 kg, SpO2 92 %. Body mass index is 27.57 kg/m. General: Well developed, well nourished, in no acute distress. Head: Normocephalic, atraumatic, sclera non-icteric, no xanthomas, nares are without discharge.  Neck: Negative for carotid bruits. JVD not elevated. Lungs: Clear bilaterally to auscultation without wheezes, rales, or rhonchi. Breathing is unlabored. Heart: Irregularly irregular rhythm. Abdomen: Soft, non-tender, non-distended with normoactive bowel sounds. No hepatomegaly. No rebound/guarding. No obvious abdominal masses. Msk:  Strength and tone appear normal for age. Extremities: No clubbing or cyanosis. No edema.  Distal pedal pulses are 2+ and equal bilaterally. Neuro: Alert and oriented X 3. No facial asymmetry. No focal deficit. Moves all extremities spontaneously. Psych:  Responds to questions appropriately with a normal affect.     Assessment and Plan  80 year old male with history of paroxysmal A. fib who has been in persistent A. fib for the last several weeks.  Was scheduled for elective cardioversion after adequate anticoagulation with warfarin and being treated with metoprolol.  Had A. fib with RVR with some evidence of volume depletion.  We will continue with warfarin with INR goal between 2 and 3.  We will continue with metoprolol.  Has been placed on a diltiazem drip.  We will continue  with this for rate control and proceed with cardioversion in the morning.  Orders have been placed.  Patient will need to be n.p.o. after midnight.  Cardioversion attempt in the morning with further recommendations after this is completed.  Hopefully will convert and be able to be discharged.  Signed, Teodoro Spray MD  04/12/2021, 3:41 PM Pager: [phone]

## 2021-04-12 NOTE — H&P (Addendum)
History and Physical  Robert Rivas PJA:[id number] DOB: [birth year] DOA: 04/12/2021  Referring physician: dr. Kerman Rivas, Robert Rivas. PCP: Robert Crouch, MD  Outpatient Specialists: Cardiology. Patient coming from: Home.  Chief Complaint: Worsening shortness of breath.  HPI: Robert Rivas is a 80 y.o. male with medical history significant for paroxysmal A. fib on Coumadin, essential hypertension, hyperlipidemia, BPH, gout, B12 deficiency, who presented to [facility] ED with complaints of gradually worsening dyspnea of 1 day duration, worse with minimal exertion.  Not associated with chest pain or palpitations.  Denies any constitutional symptoms.  Associated with bilateral lower extremity edema up to his abdomen.  Denies any lower extremity tenderness or pain with palpation.  He has been compliant with Coumadin.  Due to dyspnea with minimal exertion his wife insisted that he comes in for further evaluation.  Patient reports that 4 days prior to presentation he had a visit with his cardiologist Dr. Nehemiah Rivas, at that time he was in A. fib with RVR.  Plan was to have a cardioversion done on Wednesday 04/14/21.  He presented to the ED for further evaluation and management of his symptomatology.  ED Course:  Afebrile.  BP 132/93, heart rate 126, respiratory rate 20, O2 saturation 95% on room air.  Lab studies remarkable for serum sodium 134, BUN 31, creatinine 1.67 with baseline of 1.3.  BNP 537.  Troponin S 197.  12 EKG A. fib with RVR 116, nonspecific ST-T changes.  QTc 486.  Review of Systems: Review of systems as noted in the HPI. All other systems reviewed and are negative.   Past Medical History:  Diagnosis Date  . A-fib ([place])   . Arthritis    lower back  . Basal cell carcinoma 10/29/2020   Right post base of skull/neck  . BPH (benign prostatic hyperplasia)   . Cancer ([place])    Skin Cancer  . Chronic kidney disease    Chronic Kidney Disease  . Coronary artery disease   . Dysrhythmia    Atrial  Fibrillation; Supraventricular Tachycardia  . Gout   . Hearing aid worn    has, does not wear  . 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 Hyperlipidemia   . Hypertension   . MI, old   . Nocturia   . Phlebitis   . 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 Squamous cell carcinoma of skin 03/26/2018   SCC IS R forearm  . Squamous cell carcinoma of skin 01/01/2008   SCC IS R forearm  . Wears dentures    partial   Past Surgical History:  Procedure Laterality Date  . cardaic stents    . CARDIAC CATHETERIZATION     PTCA with Stent Placement  . COLONOSCOPY WITH PROPOFOL N/A 12/15/2015   Procedure: COLONOSCOPY WITH PROPOFOL;  Surgeon: Lollie Sails, MD;  Location[facility] ENDOSCOPY;  Service: Endoscopy;  Laterality: N/A;  . CORONARY ARTERY BYPASS GRAFT  1992  . ECTROPION REPAIR Right 12/12/2017   Procedure: REPAIR OF ECTROPION SUTURES/EXTENSIVE RIGHT;  Surgeon: Karle Starch, MD;  Location: [place];  Service: Ophthalmology;  Laterality: Right;  . HERNIA REPAIR    . SVT ABLATION  02/22/2017   [facility]    Social History:  reports that he has never smoked. He has never  used smokeless tobacco. He reports that he does not drink alcohol and does not use drugs.   Allergies  Allergen Reactions  . Codeine Nausea And Vomiting and Nausea Only  . Tizanidine Other (See Comments)    drowsiness    Family History  Problem Relation Age of Onset  . Kidney cancer Maternal Uncle   . Prostate cancer Neg Hx   . Bladder Cancer Neg Hx       Prior to Admission medications   Medication Sig Start Date End Date Taking? Authorizing Provider  docusate sodium (COLACE) 100 MG capsule Take 100 mg by mouth daily.   Yes [provider]  ezetimibe (ZETIA) 10 MG tablet Take 10 mg by mouth at bedtime.    Yes [provider]  hydrochlorothiazide (HYDRODIURIL) 12.5 MG tablet Take 12.5 mg by mouth daily. 01/06/21  Yes [provider]  HYDROCODONE-ACETAMINOPHEN PO HYDROCODONE-ACETAMINOPHEN (HYDROCODONE/ACETAMINOPHEN), 5MG-325MG, TABLET, ORAL, MALLINCKRODT PH, 50 Start Date: 01/18/20 Status: Ordered 01/18/20  Yes [provider]  metoprolol tartrate (LOPRESSOR) 50 MG tablet Take 50 mg by mouth 2 (two) times daily. 04/06/21  Yes [provider]  montelukast (SINGULAIR) 10 MG tablet Take 10 mg by mouth at bedtime. 08/12/16  Yes [provider]  nitroGLYCERIN (NITROSTAT) 0.4 MG SL tablet Place 0.4 mg under the tongue every 5 (five) minutes as needed for chest pain. 04/09/14  Yes [provider]  olmesartan (BENICAR) 20 MG tablet olmesartan 20 mg oral tablet Start Date: 12/11/20 Status: Ordered 12/11/20  Yes [provider]  olmesartan (BENICAR) 40 MG tablet Take 40 mg by mouth 2 (two) times daily. 10/24/19  Yes [provider]  probenecid (BENEMID) 500 MG tablet  06/14/14  Yes [provider]  tamsulosin (FLOMAX) 0.4 MG CAPS capsule Take 0.4 mg by mouth daily. 01/28/21  Yes [provider]  traMADol (ULTRAM) 50 MG tablet traMADol 50 mg oral tablet Start Date: 09/29/19 Status: Ordered 09/29/19  Yes [provider]  warfarin (COUMADIN) 2 MG tablet Take 4 mg by mouth at bedtime. 04/24/19  Yes [provider]  Cyanocobalamin (B-12 COMPLIANCE INJECTION) 1000 MCG/ML KIT Inject 1,000 mcg as directed every 30 (thirty) days.    [provider]  metoprolol tartrate (LOPRESSOR) 25 MG tablet Take 1 tablet (25 mg total) by mouth every 30 (thirty) minutes as needed (palpitations/rapid heart rate). If you have taken 2 doses and are still having palpitations/symptoms.  Call your cardiologist or go immediately to the emergency department. Patient taking differently: Take 50 mg by mouth 2 (two) times daily. 10/16/16 12/05/17   Orbie Pyo, MD  montelukast (SINGULAIR) 10 MG tablet Take 10 mg by mouth at bedtime. 01/27/17 04/08/21  [provider]  probenecid (BENEMID) 500 MG tablet Take 500 mg by mouth daily. 10/01/19 04/08/21  [provider]  simvastatin (ZOCOR) 20 MG tablet Take 20 mg by mouth at bedtime. Patient not taking: No sig reported 04/24/19   [provider]  SYRINGE-NEEDLE, DISP, 3 ML 25G X 1" 3 ML MISC Use 1 Syringe monthly. 05/07/15   [provider]    Physical Exam: BP 113/87   Pulse (!) 58   Temp 98.3 F (36.8 C) (Oral)   Resp 19   Ht 6' 1"  (1.854 m)   Wt 94.8 kg   SpO2 99%   BMI 27.57 kg/m   . General: 80 y.o. year-old male well developed well nourished in no acute distress.  Alert and oriented x3. . Cardiovascular: Tachycardic, Irregular  rate and rhythm with no rubs or gallops.  No thyromegaly or JVD noted.  2+ pitting edema in lower extremity bilaterally up to abdomen.  Marland Kitchen Respiratory: Mild rales at bases with no wheezes or rales. Good inspiratory effort. . Abdomen: Soft nontender nondistended with normal bowel sounds x4 quadrants. . Muskuloskeletal: No cyanosis or clubbing. 2+ pitting edema noted in lower extremities bilaterally. . Neuro: CN II-XII intact, strength, sensation, reflexes . Skin: No ulcerative lesions noted or rashes . Psychiatry: Judgement and insight appear normal. Mood is appropriate for condition and setting          Labs on Admission:  Basic Metabolic Panel: Recent Labs  Lab 04/12/21 1237  NA 134*  K 4.0  CL 100  CO2 25  GLUCOSE 113*  BUN 31*  CREATININE 1.67*  CALCIUM 9.3   Liver Function Tests: Recent Labs  Lab 04/12/21 1237  AST 23  ALT 25  ALKPHOS 36*  BILITOT 1.0  PROT 6.9  ALBUMIN 4.1   No results for input(s): LIPASE, AMYLASE in the last 168 hours. No results for input(s): AMMONIA in the last 168 hours. CBC: Recent Labs  Lab 04/12/21 1237  WBC 6.7  HGB 12.7*  HCT 36.2*  MCV 95.3  PLT 181    Cardiac Enzymes: No results for input(s): CKTOTAL, CKMB, CKMBINDEX, TROPONINI in the last 168 hours.  BNP (last 3 results) Recent Labs    04/12/21 1237  BNP 537.2*    ProBNP (last 3 results) No results for input(s): PROBNP in the last 8760 hours.  CBG: No results for input(s): GLUCAP in the last 168 hours.  Radiological Exams on Admission: DG Chest Portable 1 View  Result Date: 04/12/2021 CLINICAL DATA:  Shortness of breath. EXAM: PORTABLE CHEST 1 VIEW COMPARISON:  January 12, 2021. FINDINGS: The heart size and mediastinal contours are within normal limits. Status post coronary bypass graft. No pneumothorax or pleural effusion is noted. Both lungs are clear. The visualized skeletal structures are unremarkable. IMPRESSION: No active disease. Electronically Signed   By: Marijo Conception M.D.   On: 04/12/2021 12:42    EKG: I independently viewed the EKG done and my findings are as followed: A. fib with RVR 116, nonspecific ST-T changes.  QTc 486.   Assessment/Plan Present on Admission: . Atrial fibrillation with RVR (HCC)  Active Problems:   Atrial fibrillation with RVR (HCC)  Dyspnea with minimal exertion likely secondary to A. fib with RVR Presented with sudden onset dyspnea with minimal exertion occurring today. On presentation in A. fib with RVR with rates as high as 160s.  Received 1 dose of IV Cardizem 10 mg x 1 in the ED.  Started on Cardizem drip. Seen by his cardiologist 4 days prior to presentation to the ED.  He was in A. fib with RVR with plan for cardioversion on 04/14/2021. Chest x-ray, personally reviewed no evidence of pulmonary edema or pulmonary infiltrates. Not hypoxemic. Maintain oxygen saturation greater than 93%.  Elevated troponin, likely demand ischemia in the setting of A. fib with RVR. Denies any anginal symptoms, no chest pain. First set troponin 197, cycle troponin. Follow 2D echo. Closely monitor on telemetry. Admission twelve-lead EKG no  evidence of acute ischemia. Cardiology consulted  Paroxysmal A. fib with RVR Continue Cardizem drip. Obtain 2D echo and TSH On Coumadin, INR 3.1. Resume home AV nodal blockade medications.  Bilateral lower extremity edema, suspect secondary to an unspecified acute CHF exacerbation. Presented with elevated BNP greater than 500. Bilateral lower extremity  edema 2+ pitting on presentation. Follow 2D echo. Low suspicion for DVT, therapeutic on Coumadin. Elevate lower extremities for now.  AKI on CKD 3B Baseline creatinine appears to be 1.3 with GFR 51 Presented with creatinine of 1.67 with GFR of 41. Continue to avoid nephrotoxic agents Monitor urine output Repeat renal panel in the morning.  Mild hypervolemic hyponatremia Presented with serum sodium 134 Fluid restriction, less than 1.8 L daily Hold home HCTZ Monitor for now.  Essential hypertension/hyperlipidemia/BPH Stable Hold HCTZ due to hyponatremia Continue home Zetia, Flomax      DVT prophylaxis: Coumadin.  Code Status: Full code, stated by the patient himself.  Family Communication: Wife at his bedside.  All questions answered to the best of my ability.  Disposition Plan: Admit to stepdown unit.  Consults called: Cardiology, Dr. Toni Arthurs.  Admission status: Inpatient status.  Patient will require at least 2 midnights for further evaluation and treatment of present condition.   Status is: Inpatient    Dispo:  Patient From: Home  Planned Disposition: Home, possibly on 04/14/2021 after cardioversion or when cardiology signs off.  Medically stable for discharge: No, ongoing management of A. fib with RVR and elevated troponin.         Kayleen Memos MD [facility] Pager [phone]  If 7PM-7AM, please contact night-coverage [email or web address] Password [facility]  04/12/2021, 2:14 PM

## 2021-04-12 NOTE — Progress Notes (Signed)
ANTICOAGULATION CONSULT NOTE  Pharmacy Consult for Warfarin Indication: atrial fibrillation  Patient Measurements: Height: 6\' 1"  (185.4 cm) Weight: 94.8 kg (209 lb) IBW/kg (Calculated) : 79.9  Labs: Recent Labs    04/12/21 1237  HGB 12.7*  HCT 36.2*  PLT 181  LABPROT 32.0*  INR 3.1*  CREATININE 1.67*  TROPONINIHS 197*    Estimated Creatinine Clearance: 39.2 mL/min (A) (by C-G formula based on SCr of 1.67 mg/dL (H)).   Medical History: Past Medical History:  Diagnosis Date  . A-fib ([place])   . Arthritis    lower back  . Basal cell carcinoma 10/29/2020   Right post base of skull/neck  . BPH (benign prostatic hyperplasia)   . Cancer ([place])    Skin Cancer  . Chronic kidney disease    Chronic Kidney Disease  . Coronary artery disease   . Dysrhythmia    Atrial Fibrillation; Supraventricular Tachycardia  . Gout   . Hearing aid worn    has, does not wear  . 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 Hyperlipidemia   . Hypertension   . MI, old   . Nocturia   . Phlebitis   . 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 Squamous cell carcinoma of skin 03/26/2018   SCC IS R forearm  . Squamous cell carcinoma of skin 01/01/2008   SCC IS R forearm  . Wears dentures    partial    Medications:  Warfarin 4 mg prior to admission Last patient reported dose was 4/17  Assessment: Patient is an 80 y/o M with medical history as above including atrial fibrillation on warfarin who presented to the ED 4/18 with shortness of breath and palpitations and found to be in Afib with RVR. Patient was started on a Cardizem drip. Pharmacy has been consulted for warfarin dosing and monitoring while inpatient.   DDI: ASA, dronedarone (home med) LFTs: WNL, Albumin 4.1  Date INR Plan  4/18 3.1 Warfarin 4 mg    Goal of Therapy:   INR 2-3   Plan:  --INR is slightly supratherapeutic. Will continue with home regimen of warfarin 4 mg today --Daily INR per protocol --CBC at least every 3 days per protocol  Benita Gutter 04/12/2021,2:26 PM

## 2021-04-12 NOTE — ED Notes (Signed)
Trop 197Kerman Passey, MD aware.

## 2021-04-12 NOTE — ED Notes (Signed)
Hall, DO at bedside.

## 2021-04-12 NOTE — ED Triage Notes (Addendum)
Patient c/o increased HR and SOB X2 days. Reports recently starting new medications. A&O x4 during triage. Patient appears weak and pale

## 2021-04-12 NOTE — ED Provider Notes (Signed)
[facility] Emergency Department Provider Note  Time seen: 12:22 PM  I have reviewed the triage vital signs and the nursing notes.   HISTORY  Chief Complaint Shortness of Breath and Palpitations   HPI Robert Rivas is a 80 y.o. male with a past medical history of paroxysmal atrial fibrillation on Coumadin, CKD, hypertension, hyperlipidemia  presents to the emergency department for 2 days of shortness of breath and palpitations.  According to the patient he was started on a new medication approximately 3 days ago and for the past 2 days he has been feeling palpitations and intermittently short of breath, worse with exertion.  Denies any chest pain or discomfort at any point.  Denies any cough congestion or fever.  No leg pain.  Past Medical History:  Diagnosis Date  . A-fib ([place])   . Arthritis    lower back  . Basal cell carcinoma 10/29/2020   Right post base of skull/neck  . BPH (benign prostatic hyperplasia)   . Cancer ([place])    Skin Cancer  . Chronic kidney disease    Chronic Kidney Disease  . Coronary artery disease   . Dysrhythmia    Atrial Fibrillation; Supraventricular Tachycardia  . Gout   . Hearing aid worn    has, does not wear  . 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 Hyperlipidemia   . Hypertension   . MI, old   . Nocturia   . Phlebitis   . 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 Squamous cell carcinoma of skin 03/26/2018   SCC IS R forearm  . Squamous cell carcinoma of skin 01/01/2008   SCC IS R forearm  . Wears dentures    partial    Patient Active Problem List   Diagnosis Date Noted  . Bradycardia 07/08/2019  . Venous insufficiency of both lower extremities 07/08/2019  . Abnormal glucose 01/10/2019  . Elevated PSA 01/10/2019  . 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  Palpitations 06/13/2018  . Leg edema, right 09/20/2017  . History of BPH 02/21/2017  . BPH with obstruction/lower urinary tract symptoms 04/25/2016  . Nocturia 04/25/2016  . Gout 03/25/2016  . History of colon polyps 03/25/2016  . Inflammation of a vein 03/25/2016  . Percutaneous transluminal coronary angioplasty status 03/25/2016  . Supraventricular tachycardia ([place]) 03/25/2016  . Benign essential HTN 06/10/2015  . Paroxysmal atrial fibrillation ([place]) 01/20/2015  . Atrial fibrillation ([place]) 05/13/2014  . 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 Chest pain 05/13/2014  . Chronic kidney disease 05/13/2014  . Essential (primary) hypertension 05/13/2014  . History of cardiac catheterization 05/13/2014  . HLD (hyperlipidemia) 05/13/2014  . Combined fat and carbohydrate induced hyperlipemia 05/13/2014  . History of cardiovascular surgery 05/13/2014  . History of anticoagulant therapy 04/28/2014  . Long term current use of anticoagulant 04/28/2014  . Benign prostatic hyperplasia with urinary obstruction 11/05/2013    Past Surgical History:  Procedure Laterality Date  . cardaic stents    . CARDIAC CATHETERIZATION     PTCA with Stent Placement  . COLONOSCOPY WITH PROPOFOL N/A 12/15/2015   Procedure: COLONOSCOPY WITH PROPOFOL;  Surgeon: Lollie Sails, MD;  Location[facility] ENDOSCOPY;  Service: Endoscopy;  Laterality: N/A;  . CORONARY ARTERY BYPASS GRAFT  1992  .  ECTROPION REPAIR Right 12/12/2017   Procedure: REPAIR OF ECTROPION SUTURES/EXTENSIVE RIGHT;  Surgeon: Karle Starch, MD;  Location: [place];  Service: Ophthalmology;  Laterality: Right;  . HERNIA REPAIR    . SVT ABLATION  02/22/2017   [facility]    Prior to Admission medications   Medication Sig Start Date End Date Taking? Authorizing Provider  Cyanocobalamin (B-12 COMPLIANCE INJECTION) 1000 MCG/ML KIT Inject 1,000 mcg as directed every 30 (thirty) days.    [provider]  docusate  sodium (COLACE) 100 MG capsule Take 100 mg by mouth daily.    [provider]  ezetimibe (ZETIA) 10 MG tablet Take 10 mg by mouth at bedtime.     [provider]  hydrochlorothiazide (HYDRODIURIL) 12.5 MG tablet Take 12.5 mg by mouth daily. 01/06/21   [provider]  metoprolol tartrate (LOPRESSOR) 25 MG tablet Take 1 tablet (25 mg total) by mouth every 30 (thirty) minutes as needed (palpitations/rapid heart rate). If you have taken 2 doses and are still having palpitations/symptoms.  Call your cardiologist or go immediately to the emergency department. Patient taking differently: Take 50 mg by mouth 2 (two) times daily. 10/16/16 12/05/17  Orbie Pyo, MD  montelukast (SINGULAIR) 10 MG tablet Take 10 mg by mouth at bedtime. 01/27/17 04/08/21  [provider]  nitroGLYCERIN (NITROSTAT) 0.4 MG SL tablet Place 0.4 mg under the tongue every 5 (five) minutes as needed for chest pain. 04/09/14   [provider]  olmesartan (BENICAR) 40 MG tablet Take 40 mg by mouth 2 (two) times daily. 10/24/19   [provider]  probenecid (BENEMID) 500 MG tablet Take 500 mg by mouth daily. 10/01/19 04/08/21  [provider]  simvastatin (ZOCOR) 20 MG tablet Take 20 mg by mouth at bedtime. 04/24/19   [provider]  SYRINGE-NEEDLE, DISP, 3 ML 25G X 1" 3 ML MISC Use 1 Syringe monthly. 05/07/15   [provider]  tamsulosin (FLOMAX) 0.4 MG CAPS capsule Take 0.4 mg by mouth daily. 01/28/21   [provider]  warfarin (COUMADIN) 2 MG tablet Take 4 mg by mouth at bedtime. 04/24/19   [provider]    Allergies  Allergen Reactions  . Codeine Nausea And Vomiting and Nausea Only  . Tizanidine Other (See Comments)    drowsiness    Family History  Problem Relation Age of Onset  . Kidney cancer Maternal Uncle   . Prostate cancer Neg Hx   . Bladder Cancer Neg Hx     Social History Social History   Tobacco Use  .  Smoking status: Never Smoker  . Smokeless tobacco: Never Used  Vaping Use  . Vaping Use: Never used  Substance Use Topics  . Alcohol use: No  . Drug use: No    Review of Systems Constitutional: Negative for fever. Cardiovascular: Negative for chest pain.  Positive for palpitations. Respiratory: Mild shortness of breath, worse with exertion.  Negative for cough. Gastrointestinal: Negative for abdominal pain, vomiting Musculoskeletal: Negative for musculoskeletal complaints Skin: Negative for skin complaints  Neurological: Negative for headache All other ROS negative  ____________________________________________   PHYSICAL EXAM:  VITAL SIGNS: ED Triage Vitals  Enc Vitals Group     BP 04/12/21 1209 (!) 125/91     Pulse Rate 04/12/21 1209 (!) 44     Resp 04/12/21 1209 (!) 22     Temp 04/12/21 1209 98.3 F (36.8 C)     Temp Source 04/12/21 1209 Oral  SpO2 04/12/21 1209 94 %     Weight 04/12/21 1211 209 lb (94.8 kg)     Height 04/12/21 1211 _0  (1.854 m)     Head Circumference --      Peak Flow --      Pain Score 04/12/21 1211 0     Pain Loc --      Pain Edu? --      Excl. in [place]? --    Constitutional: Alert and oriented. Well appearing and in no distress. Eyes: Normal exam ENT      Head: Normocephalic and atraumatic.      Mouth/Throat: Mucous membranes are moist. Cardiovascular: Irregular rhythm rate around 100 bpm.   Respiratory: Normal respiratory effort without tachypnea nor retractions. Breath sounds are clear Gastrointestinal: Soft and nontender. No distention.   Musculoskeletal: Nontender with normal range of motion in all extremities. Neurologic:  Normal speech and language. No gross focal neurologic deficits  Skin:  Skin is warm, dry and intact.  Psychiatric: Mood and affect are normal.  ____________________________________________    EKG  EKG viewed and interpreted by myself shows atrial fibrillation with rapid ventricular response around 116 bpm  with a narrow QRS, normal axis, normal intervals, nonspecific ST changes without ST elevation  ____________________________________________    RADIOLOGY  Chest x-ray is negative  ____________________________________________   INITIAL IMPRESSION / ASSESSMENT AND PLAN / ED COURSE  Pertinent labs & imaging results that were available during my care of the patient were reviewed by me and considered in my medical decision making (see chart for details).   Patient presents emergency department for palpitations and shortness of breath.  Patient found to be in atrial fibrillation which appears to be his baseline.  Per record review patient takes Coumadin we will check an INR.  I reviewed the patient's records patient was recently seen by his cardiologist Dr. Nehemiah Massed 04/08/2021 found to be back in atrial fibrillation difficult to control rate.  Patient has a planned electrical cardioversion 04/14/2021.  We will check labs including cardiac enzymes obtain a chest x-ray to rule out CHF.  We will continue to closely monitor.  Patient appears to be fairly rate controlled  Patient's heart rate continues to fluctuate between 100 to 150 bpm consistent with atrial fibrillation rapid ventricular response.  Patient's troponin has resulted elevated at 197 compared to 63 months ago, highly suspect this is demand induced and not indicative of NSTEMI.  We will repeat a troponin in 2 hours as a precaution.  However given the patient's elevated troponin atrial fibrillation with rapid ventricular response we will dose IV diltiazem and admit to the hospitalist service for further work-up treatment likely cardiology consultation and possible in-house cardioversion.  Patient's INR currently 3.1.  [facility] was evaluated in Emergency Department on 04/12/2021 for the symptoms described in the history of present illness. He was evaluated in the context of the global COVID-19 pandemic, which necessitated consideration that  the patient might be at risk for infection with the SARS-CoV-2 virus that causes COVID-19. Institutional protocols and algorithms that pertain to the evaluation of patients at risk for COVID-19 are in a state of rapid change based on information released by regulatory bodies including the CDC and federal and state organizations. These policies and algorithms were followed during the patient's care in the ED.  CRITICAL CARE Performed by: Harvest Dark   Total critical care time: 30 minutes  Critical care time was exclusive of separately billable procedures and treating other patients.  Critical care was necessary to treat or prevent imminent or life-threatening deterioration.  Critical care was time spent personally by me on the following activities: development of treatment plan with patient and/or surrogate as well as nursing, discussions with consultants, evaluation of patient's response to treatment, examination of patient, obtaining history from patient or surrogate, ordering and performing treatments and interventions, ordering and review of laboratory studies, ordering and review of radiographic studies, pulse oximetry and re-evaluation of patient's condition.  ____________________________________________   FINAL CLINICAL IMPRESSION(S) / ED DIAGNOSES  Atrial fibrillation with rapid ventricular response Dyspnea   Harvest Dark, MD 04/12/21 1329

## 2021-04-13 ENCOUNTER — Inpatient Hospital Stay: Payer: Medicare Other | Admitting: Registered Nurse

## 2021-04-13 ENCOUNTER — Encounter: Payer: Self-pay | Admitting: Internal Medicine

## 2021-04-13 ENCOUNTER — Inpatient Hospital Stay: Admit: 2021-04-13 | Payer: Medicare Other

## 2021-04-13 ENCOUNTER — Encounter
Admission: EM | Disposition: A | Discharge status: Home / Self Care | Payer: Self-pay | Source: Home / Self Care | Attending: Internal Medicine

## 2021-04-13 ENCOUNTER — Ambulatory Visit: Admission: RE | Admit: 2021-04-13 | Payer: Medicare Other | Source: Home / Self Care | Admitting: Internal Medicine

## 2021-04-13 DIAGNOSIS — I4891 Unspecified atrial fibrillation: Secondary | ICD-10-CM

## 2021-04-13 HISTORY — PX: CARDIOVERSION: SHX1299

## 2021-04-13 LAB — COMPREHENSIVE METABOLIC PANEL
ALT: 18 U/L (ref 0–44)
AST: 17 U/L (ref 15–41)
Albumin: 3.7 g/dL (ref 3.5–5.0)
Alkaline Phosphatase: 33 U/L — ABNORMAL LOW (ref 38–126)
Anion gap: 7 (ref 5–15)
BUN: 28 mg/dL — ABNORMAL HIGH (ref 8–23)
CO2: 26 mmol/L (ref 22–32)
Calcium: 8.9 mg/dL (ref 8.9–10.3)
Chloride: 102 mmol/L (ref 98–111)
Creatinine, Ser: 1.57 mg/dL — ABNORMAL HIGH (ref 0.61–1.24)
GFR, Estimated: 44 mL/min — ABNORMAL LOW (ref >60)
Glucose, Bld: 104 mg/dL — ABNORMAL HIGH (ref 70–99)
Potassium: 3.6 mmol/L (ref 3.5–5.1)
Sodium: 135 mmol/L (ref 135–145)
Total Bilirubin: 0.9 mg/dL (ref 0.3–1.2)
Total Protein: 6.3 g/dL — ABNORMAL LOW (ref 6.5–8.1)

## 2021-04-13 LAB — CBC
HCT: 34.6 % — ABNORMAL LOW (ref 39.0–52.0)
Hemoglobin: 12.1 g/dL — ABNORMAL LOW (ref 13.0–17.0)
MCH: 33.2 pg (ref 26.0–34.0)
MCHC: 35 g/dL (ref 30.0–36.0)
MCV: 95.1 fL (ref 80.0–100.0)
Platelets: 163 10*3/uL (ref 150–400)
RBC: 3.64 MIL/uL — ABNORMAL LOW (ref 4.22–5.81)
RDW: 12.6 % (ref 11.5–15.5)
WBC: 6.7 10*3/uL (ref 4.0–10.5)
nRBC: 0 % (ref 0.0–0.2)

## 2021-04-13 LAB — MAGNESIUM: Magnesium: 2.1 mg/dL (ref 1.7–2.4)

## 2021-04-13 LAB — PHOSPHORUS: Phosphorus: 3 mg/dL (ref 2.5–4.6)

## 2021-04-13 LAB — PROTIME-INR
INR: 3.5 — ABNORMAL HIGH (ref 0.8–1.2)
Prothrombin Time: 34.8 seconds — ABNORMAL HIGH (ref 11.4–15.2)

## 2021-04-13 LAB — TSH: TSH: 3.44 u[IU]/mL (ref 0.350–4.500)

## 2021-04-13 SURGERY — CARDIOVERSION
Anesthesia: General

## 2021-04-13 MED ORDER — TAMSULOSIN HCL 0.4 MG PO CAPS: 0.4000 mg | ORAL_CAPSULE | Freq: Every day | ORAL | Status: DC

## 2021-04-13 MED ORDER — SODIUM CHLORIDE 0.9 % IV SOLN: INTRAVENOUS | Status: DC | PRN

## 2021-04-13 MED ORDER — METOPROLOL TARTRATE 25 MG PO TABS
12.5000 mg | ORAL_TABLET | Freq: Two times a day (BID) | ORAL | Status: DC
  Administered 2021-04-13: 12.5 mg via ORAL
  Filled 2021-04-13 (×2): qty 1

## 2021-04-13 MED ORDER — PROPOFOL 10 MG/ML IV BOLUS
INTRAVENOUS | Status: DC | PRN
  Administered 2021-04-13: 20 mg via INTRAVENOUS
  Administered 2021-04-13: 60 mg via INTRAVENOUS

## 2021-04-13 MED ORDER — EZETIMIBE 10 MG PO TABS
10.0000 mg | ORAL_TABLET | Freq: Every day | ORAL | Status: DC
  Administered 2021-04-13: 10 mg via ORAL
  Filled 2021-04-13: qty 1

## 2021-04-13 MED ORDER — MONTELUKAST SODIUM 10 MG PO TABS
10.0000 mg | ORAL_TABLET | Freq: Every day | ORAL | Status: DC
  Administered 2021-04-13: 10 mg via ORAL
  Filled 2021-04-13 (×2): qty 1

## 2021-04-13 MED ORDER — HYDROCORTISONE 1 % EX CREA
1.0000 "application " | TOPICAL_CREAM | Freq: Three times a day (TID) | CUTANEOUS | Status: DC | PRN
  Filled 2021-04-13: qty 28

## 2021-04-13 NOTE — ED Notes (Signed)
Report given to [facility] RN in [place] who is coming to get pt to take him to be cardioverted

## 2021-04-13 NOTE — Anesthesia Postprocedure Evaluation (Signed)
Anesthesia Post Note  [name] [facility]  Procedure(s) Performed: CARDIOVERSION (N/A )  Patient location during evaluation: Other (specials recovery) Anesthesia Type: General Level of consciousness: awake and alert and oriented Pain management: pain level controlled Vital Signs Assessment: post-procedure vital signs reviewed and stable Respiratory status: spontaneous breathing, nonlabored ventilation and respiratory function stable Cardiovascular status: blood pressure returned to baseline and stable Postop Assessment: no signs of nausea or vomiting Anesthetic complications: no   No complications documented.   Last Vitals:  Vitals:   04/13/21 0822 04/13/21 0830  BP: 105/60 103/63  Pulse: (!) 52 (!) 37  Resp: 16 17  Temp:    SpO2: 97% 100%    Last Pain:  Vitals:   04/13/21 0830  TempSrc:   PainSc: 0-No pain                 Justino Boze

## 2021-04-13 NOTE — CV Procedure (Signed)
Electrical Cardioversion Procedure Note Robert Rivas [id number] [birth year]  Procedure: Electrical Cardioversion Indications:  Paroxysmal non valvular atrial fibrillation  Procedure Details Consent: Risks of procedure as well as the alternatives and risks of each were explained to the (patient/caregiver).  Consent for procedure obtained. Time Out: Verified patient identification, verified procedure, site/side was marked, verified correct patient position, special equipment/implants available, medications/allergies/relevent history reviewed, required imaging and test results available.  Performed  Patient placed on cardiac monitor, pulse oximetry, supplemental oxygen as necessary.  Sedation given: Propofol and versed as per anesthesia  Pacer pads placed anterior and posterior chest.  Cardioverted 1 time(s).  Cardioverted at 120J.  Evaluation Findings: Post procedure EKG shows: NSR with sinus node dysfunction and bradycardia Complications: None Patient did tolerate procedure well.   Robert Rivas M.D. [facility] 04/13/2021, 8:28 AM

## 2021-04-13 NOTE — Anesthesia Preprocedure Evaluation (Addendum)
Anesthesia Evaluation  Patient identified by MRN, date of birth, ID band Patient awake    Reviewed: Allergy & Precautions, NPO status , Patient's Chart, lab work & pertinent test results  History of Anesthesia Complications Negative for: history of anesthetic complications  Airway Mallampati: III  TM Distance: >3 FB Neck ROM: Full    Dental  (+) Partial Upper, Poor Dentition   Pulmonary neg pulmonary ROS, neg sleep apnea, neg COPD,    breath sounds clear to auscultation- rhonchi (-) wheezing      Cardiovascular hypertension, Pt. on medications + CAD, + Past MI, + Cardiac Stents (most recent stent 2011) and + CABG (1990s)  + dysrhythmias Atrial Fibrillation  Rhythm:Irregular Rate:Tachycardia - Systolic murmurs and - Diastolic murmurs    Neuro/Psych neg Seizures negative neurological ROS  negative psych ROS   GI/Hepatic negative GI ROS, Neg liver ROS,   Endo/Other  negative endocrine ROSneg diabetes  Renal/GU Renal InsufficiencyRenal disease     Musculoskeletal  (+) Arthritis ,   Abdominal (+) - obese,   Peds  Hematology negative hematology ROS (+)   Anesthesia Other Findings Past Medical History: No date: A-fib ([facility]) No date: Arthritis     Comment:  lower back 10/29/2020: Basal cell carcinoma     Comment:  Right post base of skull/neck No date: BPH (benign prostatic hyperplasia) No date: Cancer ([place])     Comment:  Skin Cancer No date: Chronic kidney disease     Comment:  Chronic Kidney Disease No date: Coronary artery disease No date: Dysrhythmia     Comment:  Atrial Fibrillation; Supraventricular Tachycardia No date: Gout No date: Hearing aid worn     Comment:  has, does not wear 01/10/2008: Hx of melanoma in situ     Comment:  L upper back 5.0cm inf to base of neck, 5.0cm lat to               spine No date: Hyperlipidemia No date: Hypertension No date: MI, old No date: Nocturia No date:  Phlebitis 04/11/2019: Squamous cell carcinoma of skin     Comment:  SCC IS R foreearm distal 04/11/2019: Squamous cell carcinoma of skin     Comment:  SCC IS R forearm proximal 03/26/2018: Squamous cell carcinoma of skin     Comment:  SCC IS R forearm 01/01/2008: Squamous cell carcinoma of skin     Comment:  SCC IS R forearm No date: Wears dentures     Comment:  partial   Reproductive/Obstetrics                            Anesthesia Physical Anesthesia Plan  ASA: III  Anesthesia Plan: General   Post-op Pain Management:    Induction: Intravenous  PONV Risk Score and Plan: 1 and Propofol infusion  Airway Management Planned: Natural Airway  Additional Equipment:   Intra-op Plan:   Post-operative Plan:   Informed Consent: I have reviewed the patients History and Physical, chart, labs and discussed the procedure including the risks, benefits and alternatives for the proposed anesthesia with the patient or authorized representative who has indicated his/her understanding and acceptance.     Dental advisory given  Plan Discussed with: CRNA and Anesthesiologist  Anesthesia Plan Comments:         Anesthesia Quick Evaluation

## 2021-04-13 NOTE — ED Notes (Signed)
Pt resting on stretcher in NAD, denies needs

## 2021-04-13 NOTE — Progress Notes (Signed)
Dr. Nehemiah Massed and admitting MD at bedside to see patient.

## 2021-04-13 NOTE — Transfer of Care (Signed)
Immediate Anesthesia Transfer of Care Note  [name] [facility]  Procedure(s) Performed: CARDIOVERSION (N/A )  Patient Location: PACU and Specials Recovery  Anesthesia Type:General  Level of Consciousness: awake  Airway & Oxygen Therapy: Patient connected to nasal cannula oxygen  Post-op Assessment: Report given to RN, Post -op Vital signs reviewed and stable and Patient moving all extremities X 4  Post vital signs: Reviewed and stable  Last Vitals:  Vitals Value Taken Time  BP 94/59 04/13/21 0815  Temp    Pulse 43 04/13/21 0817  Resp 21 04/13/21 0817  SpO2 87 % 04/13/21 0817    Last Pain:  Vitals:   04/13/21 0734  TempSrc: Oral  PainSc: 0-No pain         Complications: No complications documented.

## 2021-04-13 NOTE — Discharge Summary (Signed)
Physician Discharge Summary  [name] TKW:[id number] DOB: 1941-06-09 DOA: 04/12/2021  PCP: Idelle Crouch, MD  Admit date: 04/12/2021 Discharge date: 04/13/2021  Discharge disposition: Home   Recommendations for Outpatient Follow-Up:   Follow-up with Dr. Nehemiah Massed, cardiologist, Friday, 04/16/2021.   Discharge Diagnosis:   Active Problems:   Atrial fibrillation with RVR ([place])    Discharge Condition: Stable.  Diet recommendation:  Diet Order            Diet NPO time specified  Diet effective midnight           Diet NPO time specified  Diet effective midnight           Diet - low sodium heart healthy                   Code Status: Full Code     Hospital Course:   [name] is a 80 y.o. male with medical history significant for paroxysmal A. fib on Coumadin, essential hypertension, hyperlipidemia, BPH, gout, B12 deficiency.  He had previously been established cardiologist, Dr. Nehemiah Massed, 4 days prior to admission and he was found to have A. fib with RVR.  Cardioversion was scheduled on Wednesday, 04/14/2021.  He presented to the hospital with shortness of breath worse with exertion and lower extremity edema.  He was treated with IV Cardizem for A. fib with RVR and IV Lasix for acute diastolic CHF.  He was seen in consultation by the cardiologist.  He was successfully cardioverted on 04/13/2021.  Case was discussed with Dr. Nehemiah Massed, cardiologist, who said patient was okay for discharge from their standpoint.  Patient feels better and is still stable for discharge to home today.    Discharge Exam:    Vitals:   04/13/21 0817 04/13/21 0822 04/13/21 0830 04/13/21 0845  BP:  105/60 103/63 113/64  Pulse: (!) 43 (!) 52 (!) 37 (!) 37  Resp: (!) _0 Temp:      TempSrc:      SpO2: (!) 87% 97% 100% (!) 84%  Weight:      Height:         GEN: NAD SKIN: No ras EYES: EOMI ENT: MMM CV: RRR PULM: CTA B ABD: soft, ND, NT, +BS CNS: AAO x 3, non  focal EXT: Mild b/l leg edema, no tenderness   The results of significant diagnostics from this hospitalization (including imaging, microbiology, ancillary and laboratory) are listed below for reference.     Procedures and Diagnostic Studies:   No results found.   Labs:   Basic Metabolic Panel: Recent Labs  Lab 04/12/21 1237 04/13/21 0423  NA 134* 135  K 4.0 3.6  CL 100 102  CO2 25 26  GLUCOSE 113* 104*  BUN 31* 28*  CREATININE 1.67* 1.57*  CALCIUM 9.3 8.9  MG  --  2.1  PHOS  --  3.0   GFR Estimated Creatinine Clearance: 41.7 mL/min (A) (by C-G formula based on SCr of 1.57 mg/dL (H)). Liver Function Tests: Recent Labs  Lab 04/12/21 1237 04/13/21 0423  AST 23 17  ALT 25 18  ALKPHOS 36* 33*  BILITOT 1.0 0.9  PROT 6.9 6.3*  ALBUMIN 4.1 3.7   No results for input(s): LIPASE, AMYLASE in the last 168 hours. No results for input(s): AMMONIA in the last 168 hours. Coagulation profile Recent Labs  Lab 04/12/21 1237 04/13/21 0423  INR 3.1* 3.5*    CBC: Recent Labs  Lab 04/12/21  1237 04/13/21 0423  WBC 6.7 6.7  HGB 12.7* 12.1*  HCT 36.2* 34.6*  MCV 95.3 95.1  PLT 181 163   Cardiac Enzymes: No results for input(s): CKTOTAL, CKMB, CKMBINDEX, TROPONINI in the last 168 hours. BNP: Invalid input(s): POCBNP CBG: No results for input(s): GLUCAP in the last 168 hours. D-Dimer No results for input(s): DDIMER in the last 72 hours. Hgb A1c No results for input(s): HGBA1C in the last 72 hours. Lipid Profile No results for input(s): CHOL, HDL, LDLCALC, TRIG, CHOLHDL, LDLDIRECT in the last 72 hours. Thyroid function studies Recent Labs    04/13/21 0423  TSH 3.440   Anemia work up No results for input(s): VITAMINB12, FOLATE, FERRITIN, TIBC, IRON, RETICCTPCT in the last 72 hours. Microbiology Recent Results (from the past 240 hour(s))  Resp Panel by RT-PCR (Flu A&B, Covid) Nasopharyngeal Swab     Status: None   Collection Time: 04/12/21  2:12 PM    Specimen: Nasopharyngeal Swab; Nasopharyngeal(NP) swabs in vial transport medium  Result Value Ref Range Status   SARS Coronavirus 2 by RT PCR NEGATIVE NEGATIVE Final    Comment: (NOTE) SARS-CoV-2 target nucleic acids are NOT DETECTED.  The SARS-CoV-2 RNA is generally detectable in upper respiratory specimens during the acute phase of infection. The lowest concentration of SARS-CoV-2 viral copies this assay can detect is 138 copies/mL. A negative result does not preclude SARS-Cov-2 infection and should not be used as the sole basis for treatment or other patient management decisions. A negative result may occur with  improper specimen collection/handling, submission of specimen other than nasopharyngeal swab, presence of viral mutation(s) within the areas targeted by this assay, and inadequate number of viral copies(<138 copies/mL). A negative result must be combined with clinical observations, patient history, and epidemiological information. The expected result is Negative.  Fact Sheet for Patients:  EntrepreneurPulse.com.au  Fact Sheet for Healthcare Providers:  IncredibleEmployment.be  This test is no t yet approved or cleared by the [place] FDA and  has been authorized for detection and/or diagnosis of SARS-CoV-2 by FDA under an Emergency Use Authorization (EUA). This EUA will remain  in effect (meaning this test can be used) for the duration of the COVID-19 declaration under Section 564(b)(1) of the Act, 21 U.S.C.section 360bbb-3(b)(1), unless the authorization is terminated  or revoked sooner.       Influenza A by PCR NEGATIVE NEGATIVE Final   Influenza B by PCR NEGATIVE NEGATIVE Final    Comment: (NOTE) The Xpert Xpress SARS-CoV-2/FLU/RSV plus assay is intended as an aid in the diagnosis of influenza from Nasopharyngeal swab specimens and should not be used as a sole basis for treatment. Nasal washings and aspirates are  unacceptable for Xpert Xpress SARS-CoV-2/FLU/RSV testing.  Fact Sheet for Patients: EntrepreneurPulse.com.au  Fact Sheet for Healthcare Providers: IncredibleEmployment.be  This test is not yet approved or cleared by the [place] FDA and has been authorized for detection and/or diagnosis of SARS-CoV-2 by FDA under an Emergency Use Authorization (EUA). This EUA will remain in effect (meaning this test can be used) for the duration of the COVID-19 declaration under Section 564(b)(1) of the Act, 21 U.S.C. section 360bbb-3(b)(1), unless the authorization is terminated or revoked.  Performed at [facility], [address] [place], [place] [address]      Discharge Instructions:   Discharge Instructions    Diet - low sodium heart healthy   Complete by: As directed    Increase activity slowly   Complete by: As directed  Allergies as of 04/13/2021      Reactions   Codeine Nausea And Vomiting, Nausea Only   Tizanidine Other (See Comments)   drowsiness      Medication List    STOP taking these medications   metoprolol tartrate 25 MG tablet Commonly known as: LOPRESSOR   metoprolol tartrate 50 MG tablet Commonly known as: LOPRESSOR     TAKE these medications   B-12 Compliance Injection 1000 MCG/ML Kit Generic drug: Cyanocobalamin Inject 1,000 mcg as directed every 30 (thirty) days.   docusate sodium 100 MG capsule Commonly known as: COLACE Take 100 mg by mouth daily.   dronedarone 400 MG tablet Commonly known as: MULTAQ Take 400 mg by mouth 2 (two) times daily with a meal.   ezetimibe 10 MG tablet Commonly known as: ZETIA Take 10 mg by mouth at bedtime.   hydrochlorothiazide 12.5 MG tablet Commonly known as: HYDRODIURIL Take 12.5 mg by mouth daily.   HYDROCODONE-ACETAMINOPHEN PO HYDROCODONE-ACETAMINOPHEN (HYDROCODONE/ACETAMINOPHEN), 5MG-325MG, TABLET, ORAL, MALLINCKRODT PH, 50 Start Date:  01/18/20 Status: Ordered   montelukast 10 MG tablet Commonly known as: SINGULAIR Take 10 mg by mouth at bedtime. What changed: Another medication with the same name was removed. Continue taking this medication, and follow the directions you see here.   nitroGLYCERIN 0.4 MG SL tablet Commonly known as: NITROSTAT Place 0.4 mg under the tongue every 5 (five) minutes as needed for chest pain.   olmesartan 20 MG tablet Commonly known as: BENICAR olmesartan 20 mg oral tablet Start Date: 12/11/20 Status: Ordered What changed: Another medication with the same name was removed. Continue taking this medication, and follow the directions you see here.   probenecid 500 MG tablet Commonly known as: BENEMID Take 500 mg by mouth daily. What changed: Another medication with the same name was removed. Continue taking this medication, and follow the directions you see here.   simvastatin 20 MG tablet Commonly known as: ZOCOR Take 20 mg by mouth at bedtime.   SYRINGE-NEEDLE (DISP) 3 ML 25G X 1" 3 ML Misc Use 1 Syringe monthly.   tamsulosin 0.4 MG Caps capsule Commonly known as: FLOMAX Take 0.4 mg by mouth daily.   traMADol 50 MG tablet Commonly known as: ULTRAM traMADol 50 mg oral tablet Start Date: 09/29/19 Status: Ordered   warfarin 2 MG tablet Commonly known as: COUMADIN Take 4 mg by mouth at bedtime.       Follow-up Information    Corey Skains, MD Follow up.   Specialty: Cardiology Why: Thursday  Contact information: [facility] [place] [address] [phone]                Time coordinating discharge: 32 minutes  Signed:  Jennye Boroughs  [facility] 04/13/2021, 8:53 AM   Pager on [email or web address]. If 7PM-7AM, please contact night-coverage at [email or web address]

## 2021-04-13 NOTE — Consult Note (Signed)
Cardiology Consultation Note    Patient ID: Robert Rivas, MRN: [id number], DOB/AGE: [birth year] 81 y.o. Admit date: 04/12/2021   Date of Consult: 04/13/2021 Primary Physician: Idelle Crouch, MD Primary Cardiologist: Dr. Nehemiah Massed Patient has done well overnight with no further evidence of significant congestive heart failure or myocardial infarction with flat troponins.  The patient has undergone electrical cardioversion of atrial fibrillation to normal sinus rhythm with some sinus node dysfunction and bradycardia at this time.  There is been no evidence of complication.  The patient previously was on diltiazem drip and metoprolol and will suspend those at this time due to bradycardia status post electrical cardioversion.  He will remain on warfarin for further risk reduction in stroke and antilipid medication management as well.  Past Medical History:  Diagnosis Date  . A-fib ([place])   . Arthritis    lower back  . Basal cell carcinoma 10/29/2020   Right post base of skull/neck  . BPH (benign prostatic hyperplasia)   . Cancer ([place])    Skin Cancer  . Chronic kidney disease    Chronic Kidney Disease  . Coronary artery disease   . Dysrhythmia    Atrial Fibrillation; Supraventricular Tachycardia  . Gout   . Hearing aid worn    has, does not wear  . 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 Hyperlipidemia   . Hypertension   . MI, old   . Nocturia   . Phlebitis   . 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 Squamous cell carcinoma of skin 03/26/2018   SCC IS R forearm  . Squamous cell carcinoma of skin 01/01/2008   SCC IS R forearm  . Wears dentures    partial      Surgical History:  Past Surgical History:  Procedure Laterality Date  . cardaic stents    . CARDIAC CATHETERIZATION     PTCA with Stent Placement  .  COLONOSCOPY WITH PROPOFOL N/A 12/15/2015   Procedure: COLONOSCOPY WITH PROPOFOL;  Surgeon: Lollie Sails, MD;  Location: [facility] ENDOSCOPY;  Service: Endoscopy;  Laterality: N/A;  . CORONARY ARTERY BYPASS GRAFT  1992  . ECTROPION REPAIR Right 12/12/2017   Procedure: REPAIR OF ECTROPION SUTURES/EXTENSIVE RIGHT;  Surgeon: Karle Starch, MD;  Location: [place];  Service: Ophthalmology;  Laterality: Right;  . HERNIA REPAIR    . SVT ABLATION  02/22/2017   [facility]     Home Meds: Prior to Admission medications   Medication Sig Start Date End Date Taking? Authorizing Provider  docusate sodium (COLACE) 100 MG capsule Take 100 mg by mouth daily.   Yes [provider]  dronedarone (MULTAQ) 400 MG tablet Take 400 mg by mouth 2 (two) times daily with a meal.   Yes [provider]  ezetimibe (ZETIA) 10 MG tablet Take 10 mg by mouth at bedtime.    Yes [provider]  hydrochlorothiazide (HYDRODIURIL) 12.5 MG tablet Take 12.5 mg by mouth daily. 01/06/21  Yes [provider]  HYDROCODONE-ACETAMINOPHEN PO HYDROCODONE-ACETAMINOPHEN (HYDROCODONE/ACETAMINOPHEN), 5MG-325MG, TABLET, ORAL, MALLINCKRODT PH, 50 Start Date: 01/18/20 Status: Ordered 01/18/20  Yes [provider]  metoprolol tartrate (LOPRESSOR) 50 MG tablet Take 50 mg by mouth 2 (two) times daily. 04/06/21  Yes [provider]  montelukast (SINGULAIR)  10 MG tablet Take 10 mg by mouth at bedtime. 08/12/16  Yes [provider]  nitroGLYCERIN (NITROSTAT) 0.4 MG SL tablet Place 0.4 mg under the tongue every 5 (five) minutes as needed for chest pain. 04/09/14  Yes [provider]  olmesartan (BENICAR) 20 MG tablet olmesartan 20 mg oral tablet Start Date: 12/11/20 Status: Ordered 12/11/20  Yes [provider]  olmesartan (BENICAR) 40 MG tablet Take 40 mg by mouth 2 (two) times daily. 10/24/19  Yes [provider]  probenecid (BENEMID) 500 MG tablet Take 500 mg by  mouth daily. 06/14/14  Yes [provider]  tamsulosin (FLOMAX) 0.4 MG CAPS capsule Take 0.4 mg by mouth daily. 01/28/21  Yes [provider]  traMADol (ULTRAM) 50 MG tablet traMADol 50 mg oral tablet Start Date: 09/29/19 Status: Ordered 09/29/19  Yes [provider]  warfarin (COUMADIN) 2 MG tablet Take 4 mg by mouth at bedtime. 04/24/19  Yes [provider]  Cyanocobalamin (B-12 COMPLIANCE INJECTION) 1000 MCG/ML KIT Inject 1,000 mcg as directed every 30 (thirty) days.    [provider]  metoprolol tartrate (LOPRESSOR) 25 MG tablet Take 1 tablet (25 mg total) by mouth every 30 (thirty) minutes as needed (palpitations/rapid heart rate). If you have taken 2 doses and are still having palpitations/symptoms.  Call your cardiologist or go immediately to the emergency department. Patient taking differently: Take 50 mg by mouth 2 (two) times daily. 10/16/16 12/05/17  Orbie Pyo, MD  montelukast (SINGULAIR) 10 MG tablet Take 10 mg by mouth at bedtime. 01/27/17 04/08/21  [provider]  probenecid (BENEMID) 500 MG tablet Take 500 mg by mouth daily. 10/01/19 04/08/21  [provider]  simvastatin (ZOCOR) 20 MG tablet Take 20 mg by mouth at bedtime. Patient not taking: No sig reported 04/24/19   [provider]  SYRINGE-NEEDLE, DISP, 3 ML 25G X 1" 3 ML MISC Use 1 Syringe monthly. 05/07/15   [provider]    Inpatient Medications:  . [MAR Hold] aspirin EC  81 mg Oral Daily  . [MAR Hold] ezetimibe  10 mg Oral QHS  . [MAR Hold] montelukast  10 mg Oral QHS  . [MAR Hold] tamsulosin  0.4 mg Oral Daily  . [MAR Hold] Warfarin - Pharmacist Dosing Inpatient   Does not apply q1600     Allergies:  Allergies  Allergen Reactions  . Codeine Nausea And Vomiting and Nausea Only  . Tizanidine Other (See Comments)    drowsiness    Social History   Socioeconomic History  . Marital status: Married    Spouse name: Not on file   . Number of children: Not on file  . Years of education: Not on file  . Highest education level: Not on file  Occupational History  . Not on file  Tobacco Use  . Smoking status: Never Smoker  . Smokeless tobacco: Never Used  Vaping Use  . Vaping Use: Never used  Substance and Sexual Activity  . Alcohol use: No  . Drug use: No  . Sexual activity: Not on file  Other Topics Concern  . Not on file  Social History Narrative  . Not on file   Social Determinants of Health   Financial Resource Strain: Not on file  Food Insecurity: Not on file  Transportation Needs: Not on file  Physical Activity: Not on file  Stress: Not on file  Social Connections: Not on file  Intimate Partner Violence: Not on file  Family History  Problem Relation Age of Onset  . Kidney cancer Maternal Uncle   . Prostate cancer Neg Hx   . Bladder Cancer Neg Hx      Review of Systems: A 12-system review of systems was performed and is negative except as noted in the HPI.  Labs: No results for input(s): CKTOTAL, CKMB, TROPONINI in the last 72 hours. Lab Results  Component Value Date   WBC 6.7 04/13/2021   HGB 12.1 (L) 04/13/2021   HCT 34.6 (L) 04/13/2021   MCV 95.1 04/13/2021   PLT 163 04/13/2021    Recent Labs  Lab 04/13/21 0423  NA 135  K 3.6  CL 102  CO2 26  BUN 28*  CREATININE 1.57*  CALCIUM 8.9  PROT 6.3*  BILITOT 0.9  ALKPHOS 33*  ALT 18  AST 17  GLUCOSE 104*   No results found for: CHOL, HDL, LDLCALC, TRIG No results found for: DDIMER  Radiology/Studies:  DG Chest Portable 1 View  Result Date: 04/12/2021 CLINICAL DATA:  Shortness of breath. EXAM: PORTABLE CHEST 1 VIEW COMPARISON:  January 12, 2021. FINDINGS: The heart size and mediastinal contours are within normal limits. Status post coronary bypass graft. No pneumothorax or pleural effusion is noted. Both lungs are clear. The visualized skeletal structures are unremarkable. IMPRESSION: No active disease. Electronically  Signed   By: Marijo Conception M.D.   On: 04/12/2021 12:42    Wt Readings from Last 3 Encounters:  04/13/21 94.8 kg  01/12/21 93 kg  05/07/20 92.5 kg    EKG: Atrial fibrillation with rapid ventricular response  Physical Exam:  Blood pressure 103/63, pulse (!) 37, temperature 97.7 F (36.5 C), temperature source Oral, resp. rate 17, height 6' 1"  (1.854 m), weight 94.8 kg, SpO2 100 %. Body mass index is 27.57 kg/m. General: Well developed, well nourished, in no acute distress. Head: Normocephalic, atraumatic, sclera non-icteric, no xanthomas, nares are without discharge.  Neck: Negative for carotid bruits. JVD not elevated. Lungs: Clear bilaterally to auscultation without wheezes, rales, or rhonchi. Breathing is unlabored. Heart: Irregularly irregular rhythm. Abdomen: Soft, non-tender, non-distended with normoactive bowel sounds. No hepatomegaly. No rebound/guarding. No obvious abdominal masses. Msk:  Strength and tone appear normal for age. Extremities: No clubbing or cyanosis. No edema.  Distal pedal pulses are 2+ and equal bilaterally. Neuro: Alert and oriented X 3. No facial asymmetry. No focal deficit. Moves all extremities spontaneously. Psych:  Responds to questions appropriately with a normal affect.     Assessment and Plan  80 year old male with known coronary artery disease status post previous PCI and stent placement hypertension hyperlipidemia and paroxysmal nonvalvular atrial fibrillation now cardioverted into normal sinus rhythm with some bradycardia and no evidence of complication 1.  Abstain from diltiazem drip and beta-blocker at this time due to bradycardia after electrical cardioversion to normal sinus rhythm 2.  Continue warfarin for further risk reduction and stroke with atrial fibrillation and discharged on current dose 3.  No further cardiac diagnostics necessary at this time 4.  Begin ambulation and follow-up for heart rate control and maintenance of normal sinus  rhythm and if ambulating well with no further significant symptoms it is okay for discharge to home from cardiac standpoint with follow-up next week  Signed, Corey Skains MD 04/13/2021, 8:31 AM Pager: [phone]

## 2021-05-05 ENCOUNTER — Encounter
Admission: RE | Disposition: A | Discharge status: Home / Self Care | Payer: Self-pay | Source: Home / Self Care | Attending: Internal Medicine

## 2021-05-05 ENCOUNTER — Ambulatory Visit: Payer: Medicare Other | Admitting: Anesthesiology

## 2021-05-05 ENCOUNTER — Encounter: Payer: Self-pay | Admitting: Internal Medicine

## 2021-05-05 ENCOUNTER — Ambulatory Visit
Admission: RE | Admit: 2021-05-05 | Discharge: 2021-05-05 | Disposition: A | Discharge status: Home / Self Care | Payer: Medicare Other | Attending: Internal Medicine | Admitting: Internal Medicine

## 2021-05-05 DIAGNOSIS — Z79899 Other long term (current) drug therapy: Secondary | ICD-10-CM | POA: Insufficient documentation

## 2021-05-05 DIAGNOSIS — I252 Old myocardial infarction: Secondary | ICD-10-CM | POA: Insufficient documentation

## 2021-05-05 DIAGNOSIS — Z8249 Family history of ischemic heart disease and other diseases of the circulatory system: Secondary | ICD-10-CM | POA: Diagnosis not present

## 2021-05-05 DIAGNOSIS — Z800 Family history of malignant neoplasm of digestive organs: Secondary | ICD-10-CM | POA: Diagnosis not present

## 2021-05-05 DIAGNOSIS — I129 Hypertensive chronic kidney disease with stage 1 through stage 4 chronic kidney disease, or unspecified chronic kidney disease: Secondary | ICD-10-CM | POA: Insufficient documentation

## 2021-05-05 DIAGNOSIS — I480 Paroxysmal atrial fibrillation: Secondary | ICD-10-CM | POA: Insufficient documentation

## 2021-05-05 DIAGNOSIS — Z951 Presence of aortocoronary bypass graft: Secondary | ICD-10-CM | POA: Diagnosis not present

## 2021-05-05 DIAGNOSIS — Z7901 Long term (current) use of anticoagulants: Secondary | ICD-10-CM | POA: Diagnosis not present

## 2021-05-05 DIAGNOSIS — N189 Chronic kidney disease, unspecified: Secondary | ICD-10-CM | POA: Diagnosis not present

## 2021-05-05 DIAGNOSIS — Z955 Presence of coronary angioplasty implant and graft: Secondary | ICD-10-CM | POA: Diagnosis not present

## 2021-05-05 DIAGNOSIS — Z823 Family history of stroke: Secondary | ICD-10-CM | POA: Diagnosis not present

## 2021-05-05 DIAGNOSIS — Z8041 Family history of malignant neoplasm of ovary: Secondary | ICD-10-CM | POA: Insufficient documentation

## 2021-05-05 HISTORY — PX: CARDIOVERSION: SHX1299

## 2021-05-05 SURGERY — CARDIOVERSION
Anesthesia: General

## 2021-05-05 MED ORDER — PROPOFOL 10 MG/ML IV BOLUS
INTRAVENOUS | Status: DC | PRN
  Administered 2021-05-05: 60 mg via INTRAVENOUS

## 2021-05-05 MED ORDER — PROPOFOL 10 MG/ML IV BOLUS
INTRAVENOUS | Status: AC
  Filled 2021-05-05: qty 20

## 2021-05-05 MED ORDER — SODIUM CHLORIDE 0.9 % IV SOLN: INTRAVENOUS | Status: DC | PRN

## 2021-05-05 MED ORDER — SODIUM CHLORIDE 0.9 % IV SOLN
Freq: Once | INTRAVENOUS | Status: AC
  Administered 2021-05-05: 1000 mL via INTRAVENOUS

## 2021-05-05 MED ORDER — ONDANSETRON HCL 4 MG/2ML IJ SOLN: 4.0000 mg | Freq: Once | INTRAMUSCULAR | Status: DC | PRN

## 2021-05-05 NOTE — Anesthesia Postprocedure Evaluation (Signed)
Anesthesia Post Note  [name] [facility]  Procedure(s) Performed: CARDIOVERSION (N/A )  Patient location during evaluation: Specials Recovery Anesthesia Type: General Level of consciousness: awake and alert Pain management: pain level controlled Vital Signs Assessment: post-procedure vital signs reviewed and stable Respiratory status: spontaneous breathing, nonlabored ventilation, respiratory function stable and patient connected to nasal cannula oxygen Cardiovascular status: blood pressure returned to baseline and stable Postop Assessment: no apparent nausea or vomiting Anesthetic complications: no   No complications documented.   Last Vitals:  Vitals:   05/05/21 0751 05/05/21 0756  BP:  102/66  Pulse: (!) 38 (!) 41  Resp: 19 17  Temp:    SpO2: 98% 97%    Last Pain:  Vitals:   05/05/21 0756  PainSc: 0-No pain                 Arita Miss

## 2021-05-05 NOTE — Anesthesia Preprocedure Evaluation (Addendum)
Anesthesia Evaluation  Patient identified by MRN, date of birth, ID band Patient awake    Reviewed: Allergy & Precautions, NPO status , Patient's Chart, lab work & pertinent test results  History of Anesthesia Complications Negative for: history of anesthetic complications  Airway Mallampati: III  TM Distance: >3 FB Neck ROM: Full    Dental  (+) Partial Upper, Poor Dentition   Pulmonary neg pulmonary ROS, neg sleep apnea, neg COPD,    breath sounds clear to auscultation- rhonchi (-) wheezing      Cardiovascular hypertension, Pt. on medications + CAD, + Past MI, + Cardiac Stents (most recent stent 2011) and + CABG (1990s)  + dysrhythmias Atrial Fibrillation + Valvular Problems/Murmurs MR  Rhythm:Irregular Rate:Tachycardia - Systolic murmurs and - Diastolic murmurs TTE 7893: NORMAL LEFT VENTRICULAR SYSTOLIC FUNCTION  NORMAL RIGHT VENTRICULAR SYSTOLIC FUNCTION  MODERATE MR + TR  NO VALVULAR STENOSIS  MODERATE BIATRIAL ENLARGEMENT  MILD RV ENLARGEMENT  MILD LVH  MILDLY DILATED AORTIC ROOT MEASURING UP TO 4.2 CM  MILDLY DILATED ASCENDING AORTA MEASURING UP TO 4.3 CM     Neuro/Psych neg Seizures negative neurological ROS  negative psych ROS   GI/Hepatic negative GI ROS, Neg liver ROS,   Endo/Other  negative endocrine ROSneg diabetes  Renal/GU Renal InsufficiencyRenal disease     Musculoskeletal  (+) Arthritis ,   Abdominal (+) - obese,   Peds  Hematology negative hematology ROS (+)   Anesthesia Other Findings Past Medical History: No date: A-fib ([place]) No date: Arthritis     Comment:  lower back 10/29/2020: Basal cell carcinoma     Comment:  Right post base of skull/neck No date: BPH (benign prostatic hyperplasia) No date: Cancer ([facility])     Comment:  Skin Cancer No date: Chronic kidney disease     Comment:  Chronic Kidney Disease No date: Coronary artery disease No date: Dysrhythmia     Comment:  Atrial  Fibrillation; Supraventricular Tachycardia No date: Gout No date: Hearing aid worn     Comment:  has, does not wear 01/10/2008: Hx of melanoma in situ     Comment:  L upper back 5.0cm inf to base of neck, 5.0cm lat to               spine No date: Hyperlipidemia No date: Hypertension No date: MI, old No date: Nocturia No date: Phlebitis 04/11/2019: Squamous cell carcinoma of skin     Comment:  SCC IS R foreearm distal 04/11/2019: Squamous cell carcinoma of skin     Comment:  SCC IS R forearm proximal 03/26/2018: Squamous cell carcinoma of skin     Comment:  SCC IS R forearm 01/01/2008: Squamous cell carcinoma of skin     Comment:  SCC IS R forearm No date: Wears dentures     Comment:  partial   Reproductive/Obstetrics                             Anesthesia Physical  Anesthesia Plan  ASA: III  Anesthesia Plan: General   Post-op Pain Management:    Induction: Intravenous  PONV Risk Score and Plan: 2 and Propofol infusion  Airway Management Planned: Natural Airway  Additional Equipment: None  Intra-op Plan:   Post-operative Plan:   Informed Consent: I have reviewed the patients History and Physical, chart, labs and discussed the procedure including the risks, benefits and alternatives for the proposed anesthesia with the patient or authorized representative who has  indicated his/her understanding and acceptance.     Dental advisory given  Plan Discussed with: CRNA and Anesthesiologist  Anesthesia Plan Comments: (Discussed risks of anesthesia with patient, including possibility of difficulty with spontaneous ventilation under anesthesia necessitating airway intervention, PONV, and rare risks such as cardiac or respiratory or neurological events. Patient understands.)        Anesthesia Quick Evaluation

## 2021-05-05 NOTE — CV Procedure (Signed)
Electrical Cardioversion Procedure Note [facility] [id number] 03/21/1941  Procedure: Electrical Cardioversion Indications:  Paroxysmal non valvular atrial fibrillation  Procedure Details Consent: Risks of procedure as well as the alternatives and risks of each were explained to the (patient/caregiver).  Consent for procedure obtained. Time Out: Verified patient identification, verified procedure, site/side was marked, verified correct patient position, special equipment/implants available, medications/allergies/relevent history reviewed, required imaging and test results available.  Performed  Patient placed on cardiac monitor, pulse oximetry, supplemental oxygen as necessary.  Sedation given: Propofol and versed as per anesthesia  Pacer pads placed anterior and posterior chest.  Cardioverted 1 time(s).  Cardioverted at 120J.  Evaluation Findings: Post procedure EKG shows: NSR Complications: None Patient did tolerate procedure well.   [name] M.D. [facility] 05/05/2021, 7:46 AM

## 2021-05-05 NOTE — Anesthesia Procedure Notes (Signed)
Date/Time: 05/05/2021 7:37 AM Performed by: Doreen Salvage, CRNA Pre-anesthesia Checklist: Patient identified, Emergency Drugs available, Suction available and Patient being monitored Patient Re-evaluated:Patient Re-evaluated prior to induction Oxygen Delivery Method: Nasal cannula Induction Type: IV induction Dental Injury: Teeth and Oropharynx as per pre-operative assessment  Comments: Nasal cannula with etCO2 monitoring

## 2021-05-05 NOTE — Transfer of Care (Signed)
Immediate Anesthesia Transfer of Care Note  [name] [facility]  Procedure(s) Performed: CARDIOVERSION (N/A )  Patient Location: PACU and Special Procedures Recovery  Anesthesia Type:General  Level of Consciousness: drowsy  Airway & Oxygen Therapy: Patient Spontanous Breathing and Patient connected to nasal cannula oxygen  Post-op Assessment: Report given to RN and Post -op Vital signs reviewed and stable  Post vital signs: Reviewed and stable  Last Vitals:  Vitals Value Taken Time  BP 92/60 05/05/21 0748  Temp    Pulse 38 05/05/21 0751  Resp 19 05/05/21 0751  SpO2 98 % 05/05/21 0751    Last Pain:  Vitals:   05/05/21 0646  PainSc: 0-No pain         Complications: No complications documented.

## 2021-05-10 NOTE — Progress Notes (Signed)
05/11/2021 2:05 PM   Robert Rivas 1941/02/04 [id number]  Referring provider: Idelle Crouch, MD [facility] [place],  [place] [address]  Chief Complaint  Patient presents with  . Benign Prostatic Hypertrophy   Urological history: 1. Elevated PSA -PSA trend form Dr. Stacie Glaze office   3.86 on February 27, 2015  4.91 on March 08, 2016  4.00 on September 06, 2016  3.29 on October 25, 2017  4.48 on October 29, 2018  5.87 on February 07, 2019  4.38 on May 31, 2019  6.24 on October 14, 2019  5.21 on April 17, 2020  5.90 in 04/2020  5.87 in 07/2020  8.54 in 12/2020  5.44 in 04/2021  2. BPH with LU TS -I PSS 8/3 -managed with tamsulosin 0.4 mg daily -Vesicare 10 mg nightly   3. Sleep apnea -CPAP in the works   4. Bilateral hydroceles -scrotal ultrasound 05/2020   5. Right epididymal cyst -scrotal ultrasound 05/2020  HPI: Robert Rivas is a 80 y.o. male who presents today for a one year follow up.    He has a decrease in nocturia from 5-6 to 2-3.    Patient denies any modifying or aggravating factors.  Patient denies any gross hematuria, dysuria or suprapubic/flank pain.  Patient denies any fevers, chills, nausea or vomiting.    IPSS    Row Name 05/11/21 1300         International Prostate Symptom Score   How often have you had the sensation of not emptying your bladder? Not at All     How often have you had to urinate less than every two hours? Less than half the time     How often have you found you stopped and started again several times when you urinated? Less than half the time     How often have you found it difficult to postpone urination? Not at All     How often have you had a weak urinary stream? Less than 1 in 5 times     How often have you had to strain to start urination? Not at All     How many times did you typically get up at night to urinate? 3 Times     Total IPSS Score 8           Quality of Life due to urinary  symptoms   If you were to spend the rest of your life with your urinary condition just the way it is now how would you feel about that? Mixed            Score:  1-7 Mild 8-19 Moderate 20-35 Severe  PMH: Past Medical History:  Diagnosis Date  . A-fib ([place])   . Arthritis    lower back  . Basal cell carcinoma 10/29/2020   Right post base of skull/neck  . BPH (benign prostatic hyperplasia)   . Cancer ([place])    Skin Cancer  . Chronic kidney disease    Chronic Kidney Disease  . Coronary artery disease   . Dysrhythmia    Atrial Fibrillation; Supraventricular Tachycardia  . Gout   . Hearing aid worn    has, does not wear  . 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 Hyperlipidemia   . Hypertension   . MI, old   . Nocturia   . Phlebitis   .  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 Squamous cell carcinoma of skin 03/26/2018   SCC IS R forearm  . Squamous cell carcinoma of skin 01/01/2008   SCC IS R forearm  . Wears dentures    partial    Surgical History: Past Surgical History:  Procedure Laterality Date  . cardaic stents    . CARDIAC CATHETERIZATION     PTCA with Stent Placement  . CARDIOVERSION N/A 04/13/2021   Procedure: CARDIOVERSION;  Surgeon: Corey Skains, MD;  Location: [facility] ORS;  Service: Cardiovascular;  Laterality: N/A;  . CARDIOVERSION N/A 05/05/2021   Procedure: CARDIOVERSION;  Surgeon: Corey Skains, MD;  Location: [facility] ORS;  Service: Cardiovascular;  Laterality: N/A;  . COLONOSCOPY WITH PROPOFOL N/A 12/15/2015   Procedure: COLONOSCOPY WITH PROPOFOL;  Surgeon: Lollie Sails, MD;  Location: [facility];  Service: Endoscopy;  Laterality: N/A;  . CORONARY ARTERY BYPASS GRAFT  1992  . ECTROPION REPAIR Right 12/12/2017   Procedure: REPAIR OF ECTROPION SUTURES/EXTENSIVE RIGHT;   Surgeon: Karle Starch, MD;  Location: [facility];  Service: Ophthalmology;  Laterality: Right;  . HERNIA REPAIR    . SVT ABLATION  02/22/2017   [facility]    Home Medications:  Allergies as of 05/11/2021      Reactions   Codeine Nausea And Vomiting, Nausea Only   Tizanidine Other (See Comments)   drowsiness      Medication List       Accurate as of May 11, 2021  2:05 PM. If you have any questions, ask your nurse or doctor.        cyanocobalamin 1000 MCG/ML injection Commonly known as: (VITAMIN B-12) Inject 1,000 mcg into the muscle every 30 (thirty) days.   docusate sodium 100 MG capsule Commonly known as: COLACE Take 100 mg by mouth in the morning.   dronedarone 400 MG tablet Commonly known as: MULTAQ Take 400 mg by mouth 2 (two) times daily with a meal.   ezetimibe 10 MG tablet Commonly known as: ZETIA Take 10 mg by mouth at bedtime.   hydrochlorothiazide 12.5 MG tablet Commonly known as: HYDRODIURIL Take 12.5 mg by mouth in the morning.   HYDROcodone-acetaminophen 5-325 MG tablet Commonly known as: NORCO/VICODIN Take 1 tablet by mouth every 6 (six) hours as needed for moderate pain.   metoprolol tartrate 25 MG tablet Commonly known as: LOPRESSOR Take 25 mg by mouth 2 (two) times daily.   montelukast 10 MG tablet Commonly known as: SINGULAIR Take 10 mg by mouth at bedtime.   nitroGLYCERIN 0.4 MG SL tablet Commonly known as: NITROSTAT Place 0.4 mg under the tongue every 5 (five) minutes x 3 doses as needed for chest pain.   olmesartan 40 MG tablet Commonly known as: BENICAR Take 40 mg by mouth in the morning and at bedtime.   probenecid 500 MG tablet Commonly known as: BENEMID Take 500 mg by mouth in the morning.   simvastatin 20 MG tablet Commonly known as: ZOCOR Take 20 mg by mouth at bedtime.   solifenacin 10 MG tablet Commonly known as: VESICARE Take 10 mg by mouth daily.   SYRINGE-NEEDLE (DISP) 3 ML 25G X 1" 3 ML Misc Use 1 Syringe  monthly.   tamsulosin 0.4 MG Caps capsule Commonly known as: FLOMAX Take 0.4 mg by mouth at bedtime.   warfarin 2 MG tablet Commonly known  as: COUMADIN Take 4 mg by mouth at bedtime.       Allergies:  Allergies  Allergen Reactions  . Codeine Nausea And Vomiting and Nausea Only  . Tizanidine Other (See Comments)    drowsiness    Family History: Family History  Problem Relation Age of Onset  . Kidney cancer Maternal Uncle   . Prostate cancer Neg Hx   . Bladder Cancer Neg Hx     Social History:  reports that he has never smoked. He has never used smokeless tobacco. He reports that he does not drink alcohol and does not use drugs.  ROS: Pertinent ROS in HPI  Physical Exam: BP (!) 169/82   Pulse (!) 132   Ht 6\' 1"  (1.854 m)   Wt 213 lb (96.6 kg)   BMI 28.10 kg/m   Constitutional:  Well nourished. Alert and oriented, No acute distress. HEENT: [place] AT, mask in place.  Trachea midline Cardiovascular: No clubbing, cyanosis, or edema. Respiratory: Normal respiratory effort, no increased work of breathing. GU: No CVA tenderness.  No bladder fullness or masses.  Patient with uncircumcised phallus.  Foreskin easily retracted  Urethral meatus is patent.  No penile discharge. No penile lesions or rashes. Scrotum without lesions, cysts, rashes and/or edema.  Testicles are located scrotally bilaterally. No masses are appreciated in the testicles. Left and right epididymis are normal. Rectal: Patient with  normal sphincter tone. Anus and perineum without scarring or rashes. No rectal masses are appreciated. Prostate is approximately 60 + grams, *could only palpate the apex and midportion of the gland, no nodules are appreciated. Seminal vesicles could not be palpated.  Lymph: No cervical or inguinal adenopathy. Neurologic: Grossly intact, no focal deficits, moving all 4 extremities. Psychiatric: Normal mood and affect.  Laboratory Data: Lab Results  Component Value Date   WBC 6.7  04/13/2021   HGB 12.1 (L) 04/13/2021   HCT 34.6 (L) 04/13/2021   MCV 95.1 04/13/2021   PLT 163 04/13/2021    Lab Results  Component Value Date   CREATININE 1.57 (H) 04/13/2021    Lab Results  Component Value Date   TSH 3.440 04/13/2021    Lab Results  Component Value Date   AST 17 04/13/2021   Lab Results  Component Value Date   ALT 18 04/13/2021   Hemoglobin A1C 4.2 - 5.6 % 6.0High   Average Blood Glucose (Calc) mg/dL 126   Resulting [place] - LAB   Narrative Performed by [place] - LAB Normal Range:  4.2 - 5.6%  Increased Risk: 5.7 - 6.4%  Diabetes:    >= 6.5%  Glycemic Control for adults with diabetes: <7%  Specimen Collected: 05/06/21 11:40 Last Resulted: 05/06/21 13:28  Received From: [place]  Result Received: 05/10/21 20:24    Color Yellow, Violet, Light Violet, Dark Violet Yellow   Clarity Clear Clear   Specific Gravity 1.000 - 1.030 1.025   pH, Urine 5.0 - 8.0 6.0   Protein, Urinalysis Negative, Trace mg/dL 30 Abnormal   Glucose, Urinalysis Negative mg/dL Negative   Ketones, Urinalysis Negative mg/dL Negative   Blood, Urinalysis Negative Negative   Nitrite, Urinalysis Negative Negative   Leukocyte Esterase, Urinalysis Negative Negative   White Blood Cells, Urinalysis None Seen, 0-3 /hpf 0-3   Red Blood Cells, Urinalysis None Seen, 0-3 /hpf 0-3   Bacteria, Urinalysis None Seen /hpf RareAbnormal   Squamous Epithelial Cells, Urinalysis Rare, Few, None Seen /hpf Rare   Hyaline  Casts, Urinalysis  2   Comment: 0-2 per low power field   [place]   Narrative Performed by [facility] - LAB Small mucus strands present  Specimen Collected: 05/06/21 11:40 Last Resulted: 05/06/21 12:56  Received From: [place]  Result Received: 05/10/21 20:24  I have reviewed the labs.   Pertinent Imaging: No recent imaging  Assessment &  Plan:    1. Elevated PSA -normal for age group  2. BPH with LU TS -symptoms stable  -continue tamsulosin 0.4 mg daily   3. Nocturia -working on getting a functioning CPAP  -nocturia has decreased significantly on Vesicare 10 mg daily  No follow-ups on file.  These notes generated with voice recognition software. I apologize for typographical errors.  Zara Council, PA-C  [facility] [address]  [place], [place] [address] [phone]

## 2021-05-11 ENCOUNTER — Other Ambulatory Visit: Payer: Self-pay

## 2021-05-11 ENCOUNTER — Encounter: Payer: Self-pay | Admitting: Urology

## 2021-05-11 ENCOUNTER — Ambulatory Visit (INDEPENDENT_AMBULATORY_CARE_PROVIDER_SITE_OTHER): Payer: Medicare Other | Admitting: Urology

## 2021-05-11 VITALS — BP 169/82 | HR 132 | Ht 73.0 in | Wt 213.0 lb

## 2021-05-11 DIAGNOSIS — N401 Enlarged prostate with lower urinary tract symptoms: Secondary | ICD-10-CM

## 2021-05-11 DIAGNOSIS — R972 Elevated prostate specific antigen [PSA]: Secondary | ICD-10-CM

## 2021-05-11 DIAGNOSIS — R351 Nocturia: Secondary | ICD-10-CM | POA: Diagnosis not present

## 2021-05-11 DIAGNOSIS — N138 Other obstructive and reflux uropathy: Secondary | ICD-10-CM

## 2021-12-31 ENCOUNTER — Emergency Department
Admission: EM | Admit: 2021-12-31 | Discharge: 2021-12-31 | Disposition: A | Discharge status: Home / Self Care | Payer: Medicare Other | Attending: Student in an Organized Health Care Education/Training Program | Admitting: Student in an Organized Health Care Education/Training Program

## 2021-12-31 ENCOUNTER — Encounter: Payer: Self-pay | Admitting: Emergency Medicine

## 2021-12-31 ENCOUNTER — Emergency Department: Payer: Medicare Other

## 2021-12-31 DIAGNOSIS — W19XXXA Unspecified fall, initial encounter: Secondary | ICD-10-CM

## 2021-12-31 DIAGNOSIS — S0990XA Unspecified injury of head, initial encounter: Secondary | ICD-10-CM | POA: Diagnosis present

## 2021-12-31 DIAGNOSIS — Y92410 Unspecified street and highway as the place of occurrence of the external cause: Secondary | ICD-10-CM | POA: Insufficient documentation

## 2021-12-31 DIAGNOSIS — S0101XA Laceration without foreign body of scalp, initial encounter: Secondary | ICD-10-CM | POA: Diagnosis not present

## 2021-12-31 DIAGNOSIS — W01198A Fall on same level from slipping, tripping and stumbling with subsequent striking against other object, initial encounter: Secondary | ICD-10-CM | POA: Insufficient documentation

## 2021-12-31 DIAGNOSIS — Z23 Encounter for immunization: Secondary | ICD-10-CM | POA: Diagnosis not present

## 2021-12-31 MED ORDER — TETANUS-DIPHTH-ACELL PERTUSSIS 5-2.5-18.5 LF-MCG/0.5 IM SUSY
0.5000 mL | PREFILLED_SYRINGE | Freq: Once | INTRAMUSCULAR | Status: AC
  Administered 2021-12-31: 0.5 mL via INTRAMUSCULAR
  Filled 2021-12-31: qty 0.5

## 2021-12-31 NOTE — ED Provider Notes (Signed)
[facility] Provider Note  Patient Contact: 7:59 PM (approximate)   History   Fall   HPI  Robert Rivas is a 81 y.o. male who presents to the emergency department after slipping and falling on his driveway.  This was a mechanical fall and patient fell backwards and hit his head.  No loss of consciousness.  He sustained soft tissue injury to the occipital skull.  He is on anticoagulation and did have somewhat of a difficult time controlling bleeding but this was controlled prior to arrival.  No subsequent loss of consciousness.  Patient denies any visual changes, neck pain, radicular symptoms in the upper or lower extremities.     Physical Exam   Triage Vital Signs: ED Triage Vitals  Enc Vitals Group     BP 12/31/21 1551 (!) 171/70     Pulse Rate 12/31/21 1551 60     Resp 12/31/21 1551 18     Temp 12/31/21 1551 97.9 F (36.6 C)     Temp Source 12/31/21 1551 Oral     SpO2 12/31/21 1551 96 %     Weight 12/31/21 1523 212 lb 15.4 oz (96.6 kg)     Height 12/31/21 1523 6\' 1"  (1.854 m)     Head Circumference --      Peak Flow --      Pain Score 12/31/21 1523 0     Pain Loc --      Pain Edu? --      Excl. in [place]? --     Most recent vital signs: Vitals:   12/31/21 1551  BP: (!) 171/70  Pulse: 60  Resp: 18  Temp: 97.9 F (36.6 C)  SpO2: 96%     General: Alert and in no acute distress. Eyes:  PERRL. EOMI. Head: Skin tear type injury to the occipital skull.  No frank laceration.  No retained foreign body.  Bleeding controlled at this time.  Patient has a rather large hematoma underlying this area.  No battle signs, raccoon eyes, serosanguineous fluid drainage from ears or nares.  Neck: No stridor. No cervical spine tenderness to palpation.  Cardiovascular:  Good peripheral perfusion Respiratory: Normal respiratory effort without tachypnea or retractions. Lungs CTAB. Good air entry to the bases with no decreased or absent breath  sounds. Musculoskeletal: Full range of motion to all extremities.  Neurologic:  No gross focal neurologic deficits are appreciated.  Skin:   No rash noted Other:   ED Results / Procedures / Treatments   Labs (all labs ordered are listed, but only abnormal results are displayed) Labs Reviewed - No data to display   EKG     RADIOLOGY  I personally viewed and evaluated these images as part of my medical decision making, as well as reviewing the written report by the radiologist.  ED Provider Interpretation: No evidence of skull fracture or intracranial hemorrhage.  No cervical spine fracture.  Patient has hematoma identified in the soft tissue of the occipital skull  CT HEAD WO CONTRAST (5MM)  Result Date: 12/31/2021 CLINICAL DATA:  [place] backwards hit head on ground EXAM: CT HEAD WITHOUT CONTRAST CT CERVICAL SPINE WITHOUT CONTRAST TECHNIQUE: Multidetector CT imaging of the head and cervical spine was performed following the standard protocol without intravenous contrast. Multiplanar CT image reconstructions of the cervical spine were also generated. COMPARISON:  MRI 03/16/2005 FINDINGS: CT HEAD FINDINGS Brain: No acute territorial infarction, hemorrhage, or intracranial mass. 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 Mild atrophy. The ventricles are nonenlarged. Minimal white matter hypodensity likely chronic small vessel ischemic change. Vascular: No hyperdense vessels.  Carotid vascular calcification Skull: No depressed skull fracture Sinuses/Orbits: Mucosal thickening and trace fluid in the sinuses Other: Moderate right posterior scalp hematoma CT CERVICAL SPINE FINDINGS Alignment: No subluxation.  Facet alignment within normal limits. Skull base and vertebrae: No acute fracture. No primary bone lesion or focal pathologic process. Soft tissues and spinal canal: No prevertebral fluid or swelling. No visible canal hematoma.  Disc levels: Mild multilevel degenerative changes. Facet degenerative changes at multiple levels with foraminal narrowing. Upper chest: Negative. Other: None IMPRESSION: 1. No CT evidence for acute intracranial abnormality. Atrophy and mild chronic small vessel ischemic changes of the white matter. Moderate right posterior scalp hematoma 2. Degenerative changes of the cervical spine. No acute osseous abnormality. Electronically Signed   By: Donavan Foil M.D.   On: 12/31/2021 15:56   CT Cervical Spine Wo Contrast  Result Date: 12/31/2021 CLINICAL DATA:  [place] backwards hit head on ground EXAM: CT HEAD WITHOUT CONTRAST CT CERVICAL SPINE WITHOUT CONTRAST TECHNIQUE: Multidetector CT imaging of the head and cervical spine was performed following the standard protocol without intravenous contrast. Multiplanar CT image reconstructions of the cervical spine were also generated. COMPARISON:  MRI 03/16/2005 FINDINGS: CT HEAD FINDINGS Brain: No acute territorial infarction, hemorrhage, or intracranial mass. 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 Mild atrophy. The ventricles are nonenlarged. Minimal white matter hypodensity likely chronic small vessel ischemic change. Vascular: No hyperdense vessels.  Carotid vascular calcification Skull: No depressed skull fracture Sinuses/Orbits: Mucosal thickening and trace fluid in the sinuses Other: Moderate right posterior scalp hematoma CT CERVICAL SPINE FINDINGS Alignment: No subluxation.  Facet alignment within normal limits. Skull base and vertebrae: No acute fracture. No primary bone lesion or focal pathologic process. Soft tissues and spinal canal: No prevertebral fluid or swelling. No visible canal hematoma. Disc levels: Mild multilevel degenerative changes. Facet degenerative changes at multiple levels with foraminal narrowing. Upper chest: Negative. Other: None IMPRESSION: 1. No CT evidence for acute  intracranial abnormality. Atrophy and mild chronic small vessel ischemic changes of the white matter. Moderate right posterior scalp hematoma 2. Degenerative changes of the cervical spine. No acute osseous abnormality. Electronically Signed   By: Donavan Foil M.D.   On: 12/31/2021 15:56    PROCEDURES:  Critical Care performed: No  Procedures   MEDICATIONS ORDERED IN ED: Medications  Tdap (BOOSTRIX) injection 0.5 mL (has no administration in time range)     IMPRESSION / MDM / ASSESSMENT AND PLAN / ED COURSE  I reviewed the triage vital signs and the nursing notes.                              Differential diagnosis includes, but is not limited to, skull fracture, intracranial hemorrhage, cervical spine fracture, hematoma of the scalp, scalp laceration   Patient's diagnosis is consistent with fall, scalp skin tear.  Patient presented to the emergency department after sustaining a mechanical fall while walking on his driveway.  He did hit the occipital skull on the driveway.  No loss of consciousness.  Patient was neurologically intact on arrival.  He is on anticoagulation due to A. fib.  In fact on review of the patient's medical record, patient has been recently changing doses trying to maintain a therapeutic level  as he has been running with INR's in the threes recently.  Patient saw his cardiologist a week ago, scheduled to see cardiology again in 3 days to ensure improvement of his INR.  Given this finding of head injury, plus anticoagulation patient was evaluated with CT scan of the head and neck.  No acute intracranial hemorrhage or skull fracture identified on imaging.  No cervical spine fracture.  Patient remains neurologically intact.  Patient had a skin tear type injury to the occipital skull which was cleansed with Betadine and saline.  Dressing reapplied.  Wound care instructions discussed with the patient and his wife who is also present at this time.  Follow-up with primary care  as needed, follow-up with cardiology to ensure his INR is improving on home dose of Coumadin.  Return precautions discussed with the patient and his wife for any concerning signs and symptoms.  Tetanus shot updated today.. Patient is given ED precautions to return to the ED for any worsening or new symptoms.       FINAL CLINICAL IMPRESSION(S) / ED DIAGNOSES   Final diagnoses:  Fall, initial encounter  Laceration of scalp, initial encounter     Rx / DC Orders   ED Discharge Orders     None        Note:  This document was prepared using Dragon voice recognition software and may include unintentional dictation errors.   Brynda Peon 12/31/21 2104    Carrie Mew, MD 12/31/21 2106

## 2021-12-31 NOTE — ED Triage Notes (Signed)
Arrives via [facility]:  Walking up driveway and fell backward hitting head on ground.  No LOC.  No neck pain.  No complaints.  Takes blood thinners.  NSR CBG:  141 179/72 64 95% RA AOx4

## 2022-02-14 ENCOUNTER — Emergency Department: Payer: Medicare Other

## 2022-02-14 ENCOUNTER — Inpatient Hospital Stay
Admission: EM | Admit: 2022-02-14 | Discharge: 2022-02-22 | DRG: 521 | Disposition: A | Discharge status: Skilled Nursing Facility | Payer: Medicare Other | Attending: Internal Medicine | Admitting: Internal Medicine

## 2022-02-14 ENCOUNTER — Other Ambulatory Visit: Payer: Self-pay

## 2022-02-14 DIAGNOSIS — R1310 Dysphagia, unspecified: Secondary | ICD-10-CM | POA: Diagnosis not present

## 2022-02-14 DIAGNOSIS — I10 Essential (primary) hypertension: Secondary | ICD-10-CM | POA: Diagnosis not present

## 2022-02-14 DIAGNOSIS — R410 Disorientation, unspecified: Secondary | ICD-10-CM | POA: Diagnosis not present

## 2022-02-14 DIAGNOSIS — I252 Old myocardial infarction: Secondary | ICD-10-CM

## 2022-02-14 DIAGNOSIS — Z885 Allergy status to narcotic agent status: Secondary | ICD-10-CM

## 2022-02-14 DIAGNOSIS — Z539 Procedure and treatment not carried out, unspecified reason: Secondary | ICD-10-CM | POA: Diagnosis not present

## 2022-02-14 DIAGNOSIS — Z888 Allergy status to other drugs, medicaments and biological substances status: Secondary | ICD-10-CM | POA: Diagnosis not present

## 2022-02-14 DIAGNOSIS — I480 Paroxysmal atrial fibrillation: Secondary | ICD-10-CM | POA: Diagnosis present

## 2022-02-14 DIAGNOSIS — R338 Other retention of urine: Secondary | ICD-10-CM | POA: Diagnosis present

## 2022-02-14 DIAGNOSIS — I482 Chronic atrial fibrillation, unspecified: Secondary | ICD-10-CM | POA: Diagnosis not present

## 2022-02-14 DIAGNOSIS — Z7901 Long term (current) use of anticoagulants: Secondary | ICD-10-CM

## 2022-02-14 DIAGNOSIS — Z79899 Other long term (current) drug therapy: Secondary | ICD-10-CM | POA: Diagnosis not present

## 2022-02-14 DIAGNOSIS — I129 Hypertensive chronic kidney disease with stage 1 through stage 4 chronic kidney disease, or unspecified chronic kidney disease: Secondary | ICD-10-CM | POA: Diagnosis present

## 2022-02-14 DIAGNOSIS — N400 Enlarged prostate without lower urinary tract symptoms: Secondary | ICD-10-CM | POA: Diagnosis not present

## 2022-02-14 DIAGNOSIS — N1832 Chronic kidney disease, stage 3b: Secondary | ICD-10-CM | POA: Diagnosis present

## 2022-02-14 DIAGNOSIS — M47816 Spondylosis without myelopathy or radiculopathy, lumbar region: Secondary | ICD-10-CM | POA: Diagnosis present

## 2022-02-14 DIAGNOSIS — T501X5A Adverse effect of loop [high-ceiling] diuretics, initial encounter: Secondary | ICD-10-CM | POA: Diagnosis not present

## 2022-02-14 DIAGNOSIS — S79912A Unspecified injury of left hip, initial encounter: Secondary | ICD-10-CM | POA: Diagnosis present

## 2022-02-14 DIAGNOSIS — E222 Syndrome of inappropriate secretion of antidiuretic hormone: Secondary | ICD-10-CM | POA: Diagnosis present

## 2022-02-14 DIAGNOSIS — W19XXXA Unspecified fall, initial encounter: Secondary | ICD-10-CM

## 2022-02-14 DIAGNOSIS — I2510 Atherosclerotic heart disease of native coronary artery without angina pectoris: Secondary | ICD-10-CM | POA: Diagnosis present

## 2022-02-14 DIAGNOSIS — Z951 Presence of aortocoronary bypass graft: Secondary | ICD-10-CM

## 2022-02-14 DIAGNOSIS — Z20822 Contact with and (suspected) exposure to covid-19: Secondary | ICD-10-CM | POA: Diagnosis present

## 2022-02-14 DIAGNOSIS — Z85828 Personal history of other malignant neoplasm of skin: Secondary | ICD-10-CM

## 2022-02-14 DIAGNOSIS — E785 Hyperlipidemia, unspecified: Secondary | ICD-10-CM | POA: Diagnosis present

## 2022-02-14 DIAGNOSIS — D631 Anemia in chronic kidney disease: Secondary | ICD-10-CM | POA: Diagnosis present

## 2022-02-14 DIAGNOSIS — Z8051 Family history of malignant neoplasm of kidney: Secondary | ICD-10-CM

## 2022-02-14 DIAGNOSIS — N401 Enlarged prostate with lower urinary tract symptoms: Secondary | ICD-10-CM | POA: Diagnosis present

## 2022-02-14 DIAGNOSIS — N183 Chronic kidney disease, stage 3 unspecified: Secondary | ICD-10-CM

## 2022-02-14 DIAGNOSIS — E871 Hypo-osmolality and hyponatremia: Secondary | ICD-10-CM | POA: Diagnosis not present

## 2022-02-14 DIAGNOSIS — W010XXA Fall on same level from slipping, tripping and stumbling without subsequent striking against object, initial encounter: Secondary | ICD-10-CM | POA: Diagnosis present

## 2022-02-14 DIAGNOSIS — N170 Acute kidney failure with tubular necrosis: Secondary | ICD-10-CM | POA: Diagnosis not present

## 2022-02-14 DIAGNOSIS — K5900 Constipation, unspecified: Secondary | ICD-10-CM | POA: Diagnosis not present

## 2022-02-14 DIAGNOSIS — S72002A Fracture of unspecified part of neck of left femur, initial encounter for closed fracture: Principal | ICD-10-CM

## 2022-02-14 DIAGNOSIS — I4891 Unspecified atrial fibrillation: Secondary | ICD-10-CM | POA: Diagnosis present

## 2022-02-14 DIAGNOSIS — I493 Ventricular premature depolarization: Secondary | ICD-10-CM | POA: Diagnosis present

## 2022-02-14 DIAGNOSIS — I959 Hypotension, unspecified: Secondary | ICD-10-CM | POA: Diagnosis not present

## 2022-02-14 DIAGNOSIS — R1312 Dysphagia, oropharyngeal phase: Secondary | ICD-10-CM | POA: Diagnosis not present

## 2022-02-14 DIAGNOSIS — Y92019 Unspecified place in single-family (private) house as the place of occurrence of the external cause: Secondary | ICD-10-CM

## 2022-02-14 DIAGNOSIS — Z86006 Personal history of melanoma in-situ: Secondary | ICD-10-CM

## 2022-02-14 DIAGNOSIS — S72009A Fracture of unspecified part of neck of unspecified femur, initial encounter for closed fracture: Secondary | ICD-10-CM

## 2022-02-14 DIAGNOSIS — Z01818 Encounter for other preprocedural examination: Secondary | ICD-10-CM

## 2022-02-14 DIAGNOSIS — N1831 Chronic kidney disease, stage 3a: Secondary | ICD-10-CM | POA: Diagnosis not present

## 2022-02-14 DIAGNOSIS — S72012A Unspecified intracapsular fracture of left femur, initial encounter for closed fracture: Principal | ICD-10-CM | POA: Diagnosis present

## 2022-02-14 DIAGNOSIS — I2583 Coronary atherosclerosis due to lipid rich plaque: Secondary | ICD-10-CM | POA: Diagnosis not present

## 2022-02-14 DIAGNOSIS — I4821 Permanent atrial fibrillation: Secondary | ICD-10-CM | POA: Diagnosis not present

## 2022-02-14 LAB — CBC WITH DIFFERENTIAL/PLATELET
Abs Immature Granulocytes: 0.02 10*3/uL (ref 0.00–0.07)
Basophils Absolute: 0 10*3/uL (ref 0.0–0.1)
Basophils Relative: 1 %
Eosinophils Absolute: 0 10*3/uL (ref 0.0–0.5)
Eosinophils Relative: 1 %
HCT: 35.8 % — ABNORMAL LOW (ref 39.0–52.0)
Hemoglobin: 12.3 g/dL — ABNORMAL LOW (ref 13.0–17.0)
Immature Granulocytes: 1 %
Lymphocytes Relative: 28 %
Lymphs Abs: 1 10*3/uL (ref 0.7–4.0)
MCH: 33.5 pg (ref 26.0–34.0)
MCHC: 34.4 g/dL (ref 30.0–36.0)
MCV: 97.5 fL (ref 80.0–100.0)
Monocytes Absolute: 0.5 10*3/uL (ref 0.1–1.0)
Monocytes Relative: 14 %
Neutro Abs: 2.1 10*3/uL (ref 1.7–7.7)
Neutrophils Relative %: 55 %
Platelets: 199 10*3/uL (ref 150–400)
RBC: 3.67 MIL/uL — ABNORMAL LOW (ref 4.22–5.81)
RDW: 12.3 % (ref 11.5–15.5)
WBC: 3.7 10*3/uL — ABNORMAL LOW (ref 4.0–10.5)
nRBC: 0 % (ref 0.0–0.2)

## 2022-02-14 LAB — RESP PANEL BY RT-PCR (FLU A&B, COVID) ARPGX2
Influenza A by PCR: NEGATIVE
Influenza B by PCR: NEGATIVE
SARS Coronavirus 2 by RT PCR: NEGATIVE

## 2022-02-14 LAB — BASIC METABOLIC PANEL
Anion gap: 8 (ref 5–15)
BUN: 23 mg/dL (ref 8–23)
CO2: 27 mmol/L (ref 22–32)
Calcium: 8.9 mg/dL (ref 8.9–10.3)
Chloride: 93 mmol/L — ABNORMAL LOW (ref 98–111)
Creatinine, Ser: 1.35 mg/dL — ABNORMAL HIGH (ref 0.61–1.24)
GFR, Estimated: 52 mL/min — ABNORMAL LOW (ref >60)
Glucose, Bld: 131 mg/dL — ABNORMAL HIGH (ref 70–99)
Potassium: 3.5 mmol/L (ref 3.5–5.1)
Sodium: 128 mmol/L — ABNORMAL LOW (ref 135–145)

## 2022-02-14 LAB — PROTIME-INR
INR: 2.6 — ABNORMAL HIGH (ref 0.8–1.2)
Prothrombin Time: 27.9 seconds — ABNORMAL HIGH (ref 11.4–15.2)

## 2022-02-14 MED ORDER — HYDROCODONE-ACETAMINOPHEN 5-325 MG PO TABS
1.0000 | ORAL_TABLET | Freq: Four times a day (QID) | ORAL | Status: DC | PRN
  Administered 2022-02-15 (×3): 2 via ORAL
  Administered 2022-02-16: 1 via ORAL
  Administered 2022-02-16 – 2022-02-17 (×2): 2 via ORAL
  Filled 2022-02-14 (×4): qty 2
  Filled 2022-02-14: qty 1
  Filled 2022-02-14: qty 2

## 2022-02-14 MED ORDER — HYDROMORPHONE HCL 1 MG/ML IJ SOLN
0.5000 mg | INTRAMUSCULAR | Status: DC | PRN
  Administered 2022-02-14 – 2022-02-19 (×21): 0.5 mg via INTRAVENOUS
  Filled 2022-02-14 (×21): qty 1

## 2022-02-14 MED ORDER — MUPIROCIN 2 % EX OINT: 1.0000 "application " | TOPICAL_OINTMENT | Freq: Two times a day (BID) | CUTANEOUS | Status: DC

## 2022-02-14 MED ORDER — HYDROMORPHONE HCL 1 MG/ML IJ SOLN
0.5000 mg | Freq: Once | INTRAMUSCULAR | Status: AC
  Administered 2022-02-14: 0.5 mg via INTRAVENOUS
  Filled 2022-02-14: qty 1

## 2022-02-14 MED ORDER — MORPHINE SULFATE (PF) 4 MG/ML IV SOLN
4.0000 mg | Freq: Once | INTRAVENOUS | Status: AC
  Administered 2022-02-14: 4 mg via INTRAVENOUS
  Filled 2022-02-14: qty 1

## 2022-02-14 MED ORDER — ONDANSETRON HCL 4 MG/2ML IJ SOLN
4.0000 mg | Freq: Once | INTRAMUSCULAR | Status: DC
  Filled 2022-02-14: qty 2

## 2022-02-14 MED ORDER — SODIUM CHLORIDE 0.9 % IV SOLN: INTRAVENOUS | Status: DC

## 2022-02-14 MED ORDER — SODIUM CHLORIDE 0.9 % IV BOLUS
500.0000 mL | Freq: Once | INTRAVENOUS | Status: AC
  Administered 2022-02-14: 500 mL via INTRAVENOUS

## 2022-02-14 NOTE — ED Notes (Signed)
Request made for transport to the floor ?

## 2022-02-14 NOTE — Consult Note (Signed)
Full consult note to follow in the AM.    Patient will require a hemiarthroplasty for treatment of his left femoral neck hip fracture once INR is below 1.5. Hold coumadin.  Awaiting curent INR which will determine when surgery can be scheduled.

## 2022-02-14 NOTE — Assessment & Plan Note (Addendum)
Patient is postop  Surgery,  doing well.  Medically stable to be discharged, continue warfarin.

## 2022-02-14 NOTE — H&P (Signed)
History and Physical    [name]:[id number] DOB: [birth year] DOA: 02/14/2022 DOS: the patient was seen and examined on 02/14/2022 PCP: Idelle Crouch, MD  Patient coming from: Home  Chief Complaint:  Chief Complaint  Patient presents with   Fall    HPI: Robert Rivas is a 81 y.o. male with medical history significant of PSVT s/p ablation 2018, frequent PVCs, ectopic atrioventricular node tachycardia, paroxysmal atrial fibrillation status post ablation 2022 on warfarin and amiodarone, followed by EP at [facility], paroxysmal atrial flutter, coronary artery disease s/p CABG 1992 with stents in 2012, , HTN, CKD 3, BPH, gout, CAD,  who presents to the ED with acute left hip pain sustained after he fell onto his left hip after tripping over a rug.  He did not hit his head and denies injury to anywhere but the left leg.  He was previously in his usual state of health.  He denies preceding palpitations, lightheadedness, chest pain or shortness of breath or preceding one-sided weakness numbness tingling, headache or visual disturbance. Patient last saw his local cardiologist in 10/2021. Noted that his last echocardiogram on 01/2021 with an EF of 50%, treadmill stress test on 01/2021 which showed no evidence of ischemia or arrhythmia. Patient did wear a Holter monitor for 48 hours for assessment of atrial fibrillation and frequent PVCs per [facility] electrophysiology.  ED course: BP 183/74 with otherwise normal vitals Blood work: Sodium 128, creatinine 1.35, WBC 3700, hemoglobin 12.3, all about the same as baseline labs  EKG, personally viewed and interpreted: A-fib at 50 with nonspecific ST-T wave changes  Imaging: X-ray hip with left subcapital femoral neck fracture Chest x-ray no active disease CT head chronic changes without acute abnormality  The ED provider spoke with orthopedist, Dr. Mack Guise who will take patient to the OR likely tomorrow, time dependent on INR being under  1.5 Patient given a small fluid bolus, Dilaudid for pain management and hospitalist consulted for admission.  Data Reviewed: Relevant notes from primary care and specialist visits, past discharge summaries as available in EHR, including Care Everywhere. Prior diagnostic testing as pertinent to current admission diagnoses Updated medications and problem lists for reconciliation ED course, including vitals, labs, imaging, treatment and response to treatment Triage notes, nursing and pharmacy notes and ED provider's notes Notable results as noted in HPI    Review of Systems: As mentioned in the history of present illness. All other systems reviewed and are negative. 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   L upper back 5.0cm inf to base of neck, 5.0cm lat to spine   Hyperlipidemia    Hypertension    MI, old    Nocturia    Phlebitis    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   SCC IS R foreearm distal   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   SCC IS R forearm proximal   Squamous cell carcinoma of skin 03/26/2018   SCC IS  R forearm   Squamous cell carcinoma of skin 01/01/2008   SCC IS R forearm   Wears dentures    partial   Past Surgical History:  Procedure Laterality Date   cardaic stents     CARDIAC CATHETERIZATION     PTCA with Stent Placement   CARDIOVERSION N/A 04/13/2021   Procedure: CARDIOVERSION;  Surgeon: Corey Skains, MD;  Location: [facility] ORS;  Service: Cardiovascular;  Laterality: N/A;   CARDIOVERSION N/A 05/05/2021   Procedure: CARDIOVERSION;   Surgeon: Corey Skains, MD;  Location: [facility] ORS;  Service: Cardiovascular;  Laterality: N/A;   COLONOSCOPY WITH PROPOFOL N/A 12/15/2015   Procedure: COLONOSCOPY WITH PROPOFOL;  Surgeon: Lollie Sails, MD;  Location: [facility] ENDOSCOPY;  Service: Endoscopy;  Laterality: N/A;   CORONARY ARTERY BYPASS GRAFT  1992   ECTROPION REPAIR Right 12/12/2017   Procedure: REPAIR OF ECTROPION SUTURES/EXTENSIVE RIGHT;  Surgeon: Karle Starch, MD;  Location: [place];  Service: Ophthalmology;  Laterality: Right;   HERNIA REPAIR     SVT ABLATION  02/22/2017   [facility]   Social History:  reports that he has never smoked. He has never used smokeless tobacco. He reports that he does not drink alcohol and does not use drugs.  Allergies  Allergen Reactions   Codeine Nausea And Vomiting and Nausea Only   Tizanidine Other (See Comments)    drowsiness    Family History  Problem Relation Age of Onset   Kidney cancer Maternal Uncle    Prostate cancer Neg Hx    Bladder Cancer Neg Hx     Prior to Admission medications   Medication Sig Start Date End Date Taking? Authorizing Provider  cyanocobalamin (,VITAMIN B-12,) 1000 MCG/ML injection Inject 1,000 mcg into the muscle every 30 (thirty) days.    [provider]  docusate sodium (COLACE) 100 MG capsule Take 100 mg by mouth in the morning.    [provider]  dronedarone (MULTAQ) 400 MG tablet Take 400 mg by mouth 2 (two) times daily with a meal.    [provider]  ezetimibe (ZETIA) 10 MG tablet Take 10 mg by mouth at bedtime.     [provider]  hydrochlorothiazide (HYDRODIURIL) 12.5 MG tablet Take 12.5 mg by mouth in the morning. 01/06/21   [provider]  HYDROcodone-acetaminophen (NORCO/VICODIN) 5-325 MG tablet Take 1 tablet by mouth every 6 (six) hours as needed for moderate pain.    [provider]  metoprolol tartrate (LOPRESSOR) 25 MG tablet Take 25 mg by mouth 2 (two) times daily.     [provider]  montelukast (SINGULAIR) 10 MG tablet Take 10 mg by mouth at bedtime. 08/12/16   [provider]  nitroGLYCERIN (NITROSTAT) 0.4 MG SL tablet Place 0.4 mg under the tongue every 5 (five) minutes x 3 doses as needed for chest pain. 04/09/14   [provider]  olmesartan (BENICAR) 40 MG tablet Take 40 mg by mouth in the morning and at bedtime.    [provider]  probenecid (BENEMID) 500 MG tablet Take 500 mg by mouth in the morning. 06/14/14   [provider]  simvastatin (ZOCOR) 20 MG tablet Take 20 mg by mouth at bedtime. 04/24/19   [provider]  solifenacin (VESICARE) 10 MG tablet Take 10 mg by mouth daily. 05/03/21   [provider]  SYRINGE-NEEDLE, DISP, 3 ML 25G X 1" 3 ML MISC Use 1 Syringe monthly. 05/07/15   [provider]  tamsulosin [facility])  0.4 MG CAPS capsule Take 0.4 mg by mouth at bedtime. 01/28/21   [provider]  warfarin (COUMADIN) 2 MG tablet Take 4 mg by mouth at bedtime. 04/24/19   [provider]    Physical Exam: Vitals:   02/14/22 1932 02/14/22 2030  BP:  (!) 183/74  Pulse:  76  Resp:  14  SpO2:  97%  Weight: 95.3 kg   Height: 6\' 1"  (1.854 m)    Physical Exam Vitals and nursing note reviewed.  Constitutional:      General: He is not in acute distress.    Appearance: Normal appearance.  HENT:     Head: Normocephalic and atraumatic.  Cardiovascular:     Rate and Rhythm: Normal rate and regular rhythm.     Pulses: Normal pulses.     Heart sounds: Normal heart sounds. No murmur heard. Pulmonary:     Effort: Pulmonary effort is normal.     Breath sounds: Normal breath sounds. No wheezing or rhonchi.  Abdominal:     General: Bowel sounds are normal.     Palpations: Abdomen is soft.     Tenderness: There is no abdominal tenderness.  Musculoskeletal:        General: Deformity present. No swelling or tenderness. Normal range of motion.     Cervical back: Normal  range of motion and neck supple.     Comments: Left leg shortened and external rotation  Skin:    General: Skin is warm and dry.  Neurological:     General: No focal deficit present.     Mental Status: He is alert. Mental status is at baseline.  Psychiatric:        Mood and Affect: Mood normal.        Behavior: Behavior normal.       Assessment and Plan: * Closed left hip fracture ([place])- (present on admission) Secondary to mechanical fall Pain control Orders per hip fracture order set Further orders per Ortho Per Dr Mack Guise .Marland Kitchen. will require a hemiarthroplasty for treatment of his fracture once INR is below 1.5.   ..Marland KitchenMarland KitchenDepending on his INR tonight, we may have to wait a couple of days.   If it is very high, I would recommend beginning reversal with Vit K 10mg  IV tonight and recheck INR again tomorrow AM".    Preoperative clearance Patient has extensive past cardiac history as outlined in HPI Cardiology consult for preoperative clearance otherwise do not think this will hinder proceeding with surgery  Warfarin anticoagulation Follow INR Holding warfarin for anticipated surgery  Atrial fibrillation ([place])- (present on admission) S/p ablation in 2022 and followed by cardiac electrophysiology at [facility] Continue amiodarone and metoprolol Holding warfarin for surgery  BPH (benign prostatic hyperplasia) Continue tamsulosin  Essential (primary) hypertension- (present on admission) Continue olmesartan, metoprolol and hydrochlorothiazide pending med verification  Coronary artery disease coronary artery disease s/p CABG 1992 with stents in 2012 No complaints of chest pain and EKG nonacute We will get baseline troponin Continue metoprolol, ezetimibe, nitroglycerin as needed, on losartan, simvastatin       Advance Care Planning:   Code Status: Prior full  Consults: ortho  Family Communication: none  Severity of Illness: The appropriate patient status for this patient is  INPATIENT. Inpatient status is judged to be reasonable and necessary in order to provide the required intensity of service to ensure the patient's safety. The patient's presenting symptoms, physical exam findings, and initial radiographic and laboratory data in the context of their chronic  comorbidities is felt to place them at high risk for further clinical deterioration. Furthermore, it is not anticipated that the patient will be medically stable for discharge from the hospital within 2 midnights of admission.   * I certify that at the point of admission it is my clinical judgment that the patient will require inpatient hospital care spanning beyond 2 midnights from the point of admission due to high intensity of service, high risk for further deterioration and high frequency of surveillance required.*  Author: Athena Masse, MD 02/14/2022 9:16 PM  For on call review [email or web address].

## 2022-02-14 NOTE — Assessment & Plan Note (Addendum)
Resume all home treatment except discontinue hydrochlorothiazide for hyponatremia.

## 2022-02-14 NOTE — ED Notes (Signed)
Patient transported to CT 

## 2022-02-14 NOTE — Assessment & Plan Note (Addendum)
Resume warfarin.

## 2022-02-14 NOTE — Assessment & Plan Note (Addendum)
-   Continue Flomax 

## 2022-02-14 NOTE — ED Provider Notes (Signed)
[facility] Provider Note    Event Date/Time   First MD Initiated Contact with Patient 02/14/22 1939     (approximate)   History   Fall   HPI  Robert Rivas is a 81 y.o. male with history of atrial fibrillation on Coumadin, hypertension, hyperlipidemia, BPH, and gout who presents with left hip pain, acute onset after a mechanical fall when he tripped over a rug.  The patient states he also hit his head but did not lose consciousness.  He denies any neck pain.  He denies any other injuries.  He has not been able to put weight on the left leg since the fall.      Physical Exam   Triage Vital Signs: ED Triage Vitals  Enc Vitals Group     BP 02/14/22 2030 (!) 183/74     Pulse Rate 02/14/22 2030 76     Resp 02/14/22 2030 14     Temp --      Temp src --      SpO2 02/14/22 2030 97 %     Weight 02/14/22 1932 210 lb (95.3 kg)     Height 02/14/22 1932 6\' 1"  (1.854 m)     Head Circumference --      Peak Flow --      Pain Score 02/14/22 1932 10     Pain Loc --      Pain Edu? --      Excl. in [place]? --     Most recent vital signs: Vitals:   02/14/22 2030  BP: (!) 183/74  Pulse: 76  Resp: 14  SpO2: 97%     General: Alert and oriented, uncomfortable appearing but in no acute distress. CV:  Good peripheral perfusion.  Resp:  Normal effort.  Abd:  No distention.  Other:  Left lower extremity shortened and externally rotated.  Pain on range of motion of hip.  2+ DP pulse.  Motor and sensory intact distally.  Motor intact in all extremities.  No midline cervical spinal tenderness.   ED Results / Procedures / Treatments   Labs (all labs ordered are listed, but only abnormal results are displayed) Labs Reviewed  CBC WITH DIFFERENTIAL/PLATELET - Abnormal; Notable for the following components:      Result Value   WBC 3.7 (*)    RBC 3.67 (*)    Hemoglobin 12.3 (*)    HCT 35.8 (*)    All other components within normal limits  BASIC METABOLIC PANEL  - Abnormal; Notable for the following components:   Sodium 128 (*)    Chloride 93 (*)    Glucose, Bld 131 (*)    Creatinine, Ser 1.35 (*)    GFR, Estimated 52 (*)    All other components within normal limits  RESP PANEL BY RT-PCR (FLU A&B, COVID) ARPGX2  PROTIME-INR     EKG  ED ECG REPORT I, Arta Silence, the attending physician, personally viewed and interpreted this ECG.  Date: 02/14/2022 EKG Time: 1937 Rate: 62 Rhythm: Atrial fibrillation with PVCs QRS Axis: normal Intervals: Nonspecific IVCD ST/T Wave abnormalities: Nonspecific repolarization abnormality Narrative Interpretation: Nonspecific abnormalities with no evidence of acute ischemia    RADIOLOGY  CT head: I independently viewed and interpreted the images; there is no evidence of ICH or other acute traumatic findings.  Radiology report confirms no acute abnormality.  XR L hip: I independently viewed and interpreted the images; there is a subcapital femoral neck fracture.  XR chest  x-ray: I independently viewed and interpreted the images there is no focal consolidation or edema   PROCEDURES:  Critical Care performed: No  Procedures   MEDICATIONS ORDERED IN ED: Medications  sodium chloride 0.9 % bolus 500 mL (has no administration in time range)  morphine (PF) 4 MG/ML injection 4 mg (4 mg Intravenous Given 02/14/22 2026)     IMPRESSION / MDM / ASSESSMENT AND PLAN / ED COURSE  I reviewed the triage vital signs and the nursing notes.  81 year old male with PMH as noted above including atrial fibrillation on Coumadin presents with left hip pain after a mechanical fall from standing height.  He also hit his head but did not have LOC.  On exam, the patient has shortening and external rotation of the left leg.  Neurologic exam is nonfocal.  I reviewed the x-rays which show a left subcapital femoral neck fracture.  CT head was also obtained given the patient's age and the fact that he is on  anticoagulation, and it is negative.  There is no indication for C-spine CT.  Chest x-ray is unremarkable.  Lab work-up was reviewed.  There is mild hyponatremia.  CBC is normal.  Respiratory panel is negative.  He will need an INR.  I consulted Dr. Mack Guise from orthopedics.  Based on our discussion he recommends admitting the patient to the hospital service.  He states INR will be needed to determine timing of surgery.  ----------------------------------------- 9:07 PM on 02/14/2022 -----------------------------------------  I consulted Dr. Damita Dunnings from the hospitalist service; based on our discussion she agrees to admit the patient.  FINAL CLINICAL IMPRESSION(S) / ED DIAGNOSES   Final diagnoses:  Fall with injury  Closed fracture of neck of left femur, initial encounter ([place])     Rx / DC Orders   ED Discharge Orders     None        Note:  This document was prepared using Dragon voice recognition software and may include unintentional dictation errors.    Arta Silence, MD 02/14/22 2107

## 2022-02-14 NOTE — Assessment & Plan Note (Addendum)
coronary artery disease s/p CABG 1992 with stents in 2012.

## 2022-02-14 NOTE — Assessment & Plan Note (Deleted)
Patient has extensive past cardiac history as outlined in HPI Cardiology consult for preoperative clearance otherwise do not think this will hinder proceeding with surgery

## 2022-02-14 NOTE — ED Triage Notes (Signed)
Pt BIB [facility] from home for a fall. Pt states he tripped over a rug. Pt hit his head and has a hematoma to his occiput. Pt is on a blood thinner. Pt also with severe L hip pain. Pt with shortening and external rotation to the LLE. EMS admin 165mcg of fentanyl and 4 mg of Zofran.

## 2022-02-15 ENCOUNTER — Encounter: Payer: Self-pay | Admitting: Internal Medicine

## 2022-02-15 DIAGNOSIS — I10 Essential (primary) hypertension: Secondary | ICD-10-CM | POA: Diagnosis not present

## 2022-02-15 DIAGNOSIS — Z7901 Long term (current) use of anticoagulants: Secondary | ICD-10-CM

## 2022-02-15 DIAGNOSIS — N400 Enlarged prostate without lower urinary tract symptoms: Secondary | ICD-10-CM

## 2022-02-15 DIAGNOSIS — E871 Hypo-osmolality and hyponatremia: Secondary | ICD-10-CM | POA: Diagnosis not present

## 2022-02-15 DIAGNOSIS — I2583 Coronary atherosclerosis due to lipid rich plaque: Secondary | ICD-10-CM

## 2022-02-15 DIAGNOSIS — I482 Chronic atrial fibrillation, unspecified: Secondary | ICD-10-CM

## 2022-02-15 DIAGNOSIS — N1831 Chronic kidney disease, stage 3a: Secondary | ICD-10-CM | POA: Diagnosis not present

## 2022-02-15 DIAGNOSIS — I2510 Atherosclerotic heart disease of native coronary artery without angina pectoris: Secondary | ICD-10-CM

## 2022-02-15 LAB — SODIUM, URINE, RANDOM: Sodium, Ur: 141 mmol/L

## 2022-02-15 LAB — PROTIME-INR
INR: 2.5 — ABNORMAL HIGH (ref 0.8–1.2)
INR: 2.5 — ABNORMAL HIGH (ref 0.8–1.2)
Prothrombin Time: 26.6 seconds — ABNORMAL HIGH (ref 11.4–15.2)
Prothrombin Time: 27 seconds — ABNORMAL HIGH (ref 11.4–15.2)

## 2022-02-15 LAB — OSMOLALITY, URINE: Osmolality, Ur: 780 mOsm/kg (ref 300–900)

## 2022-02-15 LAB — SURGICAL PCR SCREEN
MRSA, PCR: POSITIVE — AB
Staphylococcus aureus: POSITIVE — AB

## 2022-02-15 MED ORDER — ONDANSETRON HCL 4 MG/2ML IJ SOLN
4.0000 mg | Freq: Four times a day (QID) | INTRAMUSCULAR | Status: DC | PRN
  Administered 2022-02-15 – 2022-02-17 (×5): 4 mg via INTRAVENOUS
  Filled 2022-02-15 (×5): qty 2

## 2022-02-15 MED ORDER — PHYTONADIONE 5 MG PO TABS
2.5000 mg | ORAL_TABLET | Freq: Once | ORAL | Status: AC
  Administered 2022-02-15: 2.5 mg via ORAL
  Filled 2022-02-15: qty 1

## 2022-02-15 MED ORDER — DRONEDARONE HCL 400 MG PO TABS
400.0000 mg | ORAL_TABLET | Freq: Two times a day (BID) | ORAL | Status: DC
  Administered 2022-02-15 – 2022-02-21 (×12): 400 mg via ORAL
  Filled 2022-02-15 (×14): qty 1

## 2022-02-15 MED ORDER — CHLORHEXIDINE GLUCONATE CLOTH 2 % EX PADS
6.0000 | MEDICATED_PAD | Freq: Every day | CUTANEOUS | Status: AC
  Administered 2022-02-15 – 2022-02-19 (×5): 6 via TOPICAL

## 2022-02-15 MED ORDER — ADULT MULTIVITAMIN W/MINERALS CH
1.0000 | ORAL_TABLET | Freq: Every day | ORAL | Status: DC
  Administered 2022-02-15 – 2022-02-22 (×7): 1 via ORAL
  Filled 2022-02-15 (×8): qty 1

## 2022-02-15 MED ORDER — EZETIMIBE 10 MG PO TABS
10.0000 mg | ORAL_TABLET | Freq: Every day | ORAL | Status: DC
  Administered 2022-02-15 – 2022-02-21 (×7): 10 mg via ORAL
  Filled 2022-02-15 (×9): qty 1

## 2022-02-15 MED ORDER — MONTELUKAST SODIUM 10 MG PO TABS
10.0000 mg | ORAL_TABLET | Freq: Every day | ORAL | Status: DC
  Administered 2022-02-15 – 2022-02-21 (×7): 10 mg via ORAL
  Filled 2022-02-15 (×7): qty 1

## 2022-02-15 MED ORDER — BENZONATATE 100 MG PO CAPS
100.0000 mg | ORAL_CAPSULE | Freq: Three times a day (TID) | ORAL | Status: DC | PRN
  Administered 2022-02-15 – 2022-02-19 (×4): 100 mg via ORAL
  Filled 2022-02-15 (×4): qty 1

## 2022-02-15 MED ORDER — SIMVASTATIN 20 MG PO TABS
20.0000 mg | ORAL_TABLET | Freq: Every day | ORAL | Status: DC
Start: 2022-02-15 — End: 2022-02-15

## 2022-02-15 MED ORDER — PHYTONADIONE 5 MG PO TABS
2.5000 mg | ORAL_TABLET | Freq: Once | ORAL | Status: AC
Start: 2022-02-15 — End: 2022-02-15
  Administered 2022-02-15: 2.5 mg via ORAL
  Filled 2022-02-15: qty 1

## 2022-02-15 MED ORDER — HYDRALAZINE HCL 10 MG PO TABS
10.0000 mg | ORAL_TABLET | Freq: Three times a day (TID) | ORAL | Status: DC
  Administered 2022-02-15 – 2022-02-22 (×18): 10 mg via ORAL
  Filled 2022-02-15 (×24): qty 1

## 2022-02-15 MED ORDER — VITAMIN K1 10 MG/ML IJ SOLN
10.0000 mg | Freq: Once | INTRAVENOUS | Status: DC
  Filled 2022-02-15: qty 1

## 2022-02-15 MED ORDER — IRBESARTAN 150 MG PO TABS
300.0000 mg | ORAL_TABLET | Freq: Every day | ORAL | Status: DC
  Administered 2022-02-15 – 2022-02-18 (×4): 300 mg via ORAL
  Filled 2022-02-15 (×4): qty 2

## 2022-02-15 MED ORDER — DRONEDARONE HCL 400 MG PO TABS
400.0000 mg | ORAL_TABLET | Freq: Two times a day (BID) | ORAL | Status: DC
  Filled 2022-02-15: qty 1

## 2022-02-15 MED ORDER — NITROGLYCERIN 0.4 MG SL SUBL: 0.4000 mg | SUBLINGUAL_TABLET | SUBLINGUAL | Status: DC | PRN

## 2022-02-15 MED ORDER — METOPROLOL TARTRATE 25 MG PO TABS
25.0000 mg | ORAL_TABLET | Freq: Two times a day (BID) | ORAL | Status: DC
  Administered 2022-02-15 – 2022-02-22 (×12): 25 mg via ORAL
  Filled 2022-02-15 (×15): qty 1

## 2022-02-15 MED ORDER — SIMVASTATIN 20 MG PO TABS
10.0000 mg | ORAL_TABLET | Freq: Every day | ORAL | Status: DC
Start: 2022-02-15 — End: 2022-02-22
  Administered 2022-02-15 – 2022-02-21 (×7): 10 mg via ORAL
  Filled 2022-02-15 (×7): qty 1

## 2022-02-15 MED ORDER — TAMSULOSIN HCL 0.4 MG PO CAPS
0.4000 mg | ORAL_CAPSULE | Freq: Every day | ORAL | Status: DC
  Administered 2022-02-15 – 2022-02-19 (×5): 0.4 mg via ORAL
  Filled 2022-02-15 (×5): qty 1

## 2022-02-15 MED ORDER — MUPIROCIN 2 % EX OINT
1.0000 "application " | TOPICAL_OINTMENT | Freq: Two times a day (BID) | CUTANEOUS | Status: AC
  Administered 2022-02-15 – 2022-02-19 (×10): 1 via NASAL
  Filled 2022-02-15 (×2): qty 22

## 2022-02-15 MED ORDER — ENSURE ENLIVE PO LIQD
237.0000 mL | Freq: Two times a day (BID) | ORAL | Status: DC
Start: 2022-02-16 — End: 2022-02-22
  Administered 2022-02-17 – 2022-02-22 (×8): 237 mL via ORAL

## 2022-02-15 MED ORDER — HYDROCHLOROTHIAZIDE 12.5 MG PO TABS
12.5000 mg | ORAL_TABLET | Freq: Every morning | ORAL | Status: DC
Start: 2022-02-15 — End: 2022-02-15
  Administered 2022-02-15: 12.5 mg via ORAL
  Filled 2022-02-15: qty 1

## 2022-02-15 MED ORDER — DOCUSATE SODIUM 100 MG PO CAPS
100.0000 mg | ORAL_CAPSULE | Freq: Every morning | ORAL | Status: DC
  Administered 2022-02-15 – 2022-02-18 (×3): 100 mg via ORAL
  Filled 2022-02-15 (×2): qty 1

## 2022-02-15 NOTE — Assessment & Plan Note (Addendum)
Hyponatremia secondary to SIADH.  Patient had a severe hyponatremia after arriving the hospital, required tolvaptan.  He was also given salt tablets and fluid restriction 1515ml/day.

## 2022-02-15 NOTE — Assessment & Plan Note (Addendum)
Slightly worsening renal function due to Lasix.  Lasix will be discontinued at time of discharge.

## 2022-02-15 NOTE — Hospital Course (Addendum)
81 year old man with past medical history of atrial fibrillation on Coumadin, arthritis, BPH, basal and squamous cell skin cancer, chronic kidney disease and hypertension.  Patient presents after a mechanical fall.  INR 2.5 on 02/15/2022.  Small doses of vitamin K given.  INR will have to be under 1.5 in order to go for hemiarthroplasty. Patient developed acute hyponatremia, received tolvaptan.  Patient also developed significant delirium, was given Haldol and Seroquel. Left hip hemiarthroplasty performed on 2/25

## 2022-02-15 NOTE — Consult Note (Signed)
CARDIOLOGY CONSULT NOTE               Patient ID: Robert Rivas MRN: [id number] DOB/AGE: [birth year] 81 y.o.  Admit date: 02/14/2022 Referring Physician Dr. Judd Gaudier Primary Physician [name], MD primary Primary Cardiologist Dr. Nehemiah Massed Reason for Consultation preop clearance  HPI: Patient is a 81 year old male significant cardiac history for atrial fibrillation status post multiple cardioversions ablation for SVT patient also has significant coronary artery disease coronary bypass surgery for multivessel coronary disease patient had PCI and stent before now developed a recent fall suffering left hip fracture requiring total hip surgery.  Patient denies any chest pain no blackout spells no syncope he just tripped and fell.  Patient is being followed by Dr. Nehemiah Massed and Estill Bamberg and has done reasonably well heart rate is reasonably controlled he still on Multaq but is still in paroxysmal A-fib anticoagulation with Coumadin  Review of systems complete and found to be negative unless listed above     Past Medical History:  Diagnosis Date   A-fib [facility])    Arthritis    lower back   Basal cell carcinoma 10/29/2020   Right post base of skull/neck   BPH (benign prostatic hyperplasia)    Cancer (HCC)    Skin Cancer   Chronic kidney disease    Chronic Kidney Disease   Coronary artery disease    Dysrhythmia    Atrial Fibrillation; Supraventricular Tachycardia   Gout    Hearing aid worn    has, does not wear   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   L upper back 5.0cm inf to base of neck, 5.0cm lat to spine   Hyperlipidemia    Hypertension    MI, old    Nocturia    Phlebitis    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   SCC IS R foreearm distal   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   SCC IS R forearm proximal   Squamous cell carcinoma of skin 03/26/2018   SCC IS R forearm   Squamous cell carcinoma of skin 01/01/2008   SCC IS R forearm   Wears dentures     partial    Past Surgical History:  Procedure Laterality Date   cardaic stents     CARDIAC CATHETERIZATION     PTCA with Stent Placement   CARDIOVERSION N/A 04/13/2021   Procedure: CARDIOVERSION;  Surgeon: Corey Skains, MD;  Location: [facility] ORS;  Service: Cardiovascular;  Laterality: N/A;   CARDIOVERSION N/A 05/05/2021   Procedure: CARDIOVERSION;  Surgeon: Corey Skains, MD;  Location: [facility] ORS;  Service: Cardiovascular;  Laterality: N/A;   COLONOSCOPY WITH PROPOFOL N/A 12/15/2015   Procedure: COLONOSCOPY WITH PROPOFOL;  Surgeon: Lollie Sails, MD;  Location: [facility] ENDOSCOPY;  Service: Endoscopy;  Laterality: N/A;   CORONARY ARTERY BYPASS GRAFT  1992   ECTROPION REPAIR Right 12/12/2017   Procedure: REPAIR OF ECTROPION SUTURES/EXTENSIVE RIGHT;  Surgeon: Karle Starch, MD;  Location: [place];  Service: Ophthalmology;  Laterality: Right;   HERNIA REPAIR     SVT ABLATION  02/22/2017   [facility]    Medications Prior to Admission  Medication Sig Dispense Refill Last Dose   amiodarone (PACERONE) 200 MG tablet Take 200 mg by mouth daily.   02/14/2022   docusate sodium (COLACE) 100 MG capsule Take 100 mg by mouth in the morning.   02/14/2022   dronedarone (MULTAQ) 400 MG tablet Take 400 mg by mouth 2 (two) times daily with  a meal.   02/14/2022   hydrALAZINE (APRESOLINE) 50 MG tablet Take 50 mg by mouth 2 (two) times daily.   02/14/2022   hydrochlorothiazide (HYDRODIURIL) 12.5 MG tablet Take 12.5 mg by mouth in the morning.   02/14/2022   metoprolol tartrate (LOPRESSOR) 25 MG tablet Take 25 mg by mouth 2 (two) times daily.   02/14/2022   olmesartan (BENICAR) 40 MG tablet Take 40 mg by mouth in the morning and at bedtime.   02/14/2022   solifenacin (VESICARE) 10 MG tablet Take 10 mg by mouth daily.   02/14/2022   cyanocobalamin (,VITAMIN B-12,) 1000 MCG/ML injection Inject 1,000 mcg into the muscle every 30 (thirty) days. (Patient not taking: Reported on 02/15/2022)   Not Taking   ezetimibe  (ZETIA) 10 MG tablet Take 10 mg by mouth at bedtime.    02/13/2022   montelukast (SINGULAIR) 10 MG tablet Take 10 mg by mouth at bedtime.   02/13/2022   nitroGLYCERIN (NITROSTAT) 0.4 MG SL tablet Place 0.4 mg under the tongue every 5 (five) minutes x 3 doses as needed for chest pain.   prn at prn   simvastatin (ZOCOR) 20 MG tablet Take 20 mg by mouth at bedtime.   02/13/2022   SYRINGE-NEEDLE, DISP, 3 ML 25G X 1" 3 ML MISC Use 1 Syringe monthly.      tamsulosin (FLOMAX) 0.4 MG CAPS capsule Take 0.4 mg by mouth at bedtime.   02/13/2022   warfarin (COUMADIN) 2 MG tablet Take 4 mg by mouth at bedtime.   02/13/2022 at 2030   Social History   Socioeconomic History   Marital status: Married    Spouse name: Not on file   Number of children: Not on file   Years of education: Not on file   Highest education level: Not on file  Occupational History   Not on file  Tobacco Use   Smoking status: Never   Smokeless tobacco: Never  Vaping Use   Vaping Use: Never used  Substance and Sexual Activity   Alcohol use: No   Drug use: No   Sexual activity: Not on file  Other Topics Concern   Not on file  Social History Narrative   Not on file   Social Determinants of Health   Financial Resource Strain: Not on file  Food Insecurity: Not on file  Transportation Needs: Not on file  Physical Activity: Not on file  Stress: Not on file  Social Connections: Not on file  Intimate Partner Violence: Not on file    Family History  Problem Relation Age of Onset   Kidney cancer Maternal Uncle    Prostate cancer Neg Hx    Bladder Cancer Neg Hx       Review of systems complete and found to be negative unless listed above      PHYSICAL EXAM  General: Well developed, well nourished, in no acute distress HEENT:  Normocephalic and atramatic Neck:  No JVD.  Lungs: Clear bilaterally to auscultation and percussion. Heart: Irregularly irregular. Normal S1 and S2 without gallops or murmurs.  Abdomen: Bowel  sounds are positive, abdomen soft and non-tender  Msk:  Back normal, normal gait. Normal strength and tone for age. Extremities: No clubbing, cyanosis or edema.   Neuro: Alert and oriented X 3. Psych:  Good affect, responds appropriately  Labs:   Lab Results  Component Value Date   WBC 3.7 (L) 02/14/2022   HGB 12.3 (L) 02/14/2022   HCT 35.8 (L) 02/14/2022  MCV 97.5 02/14/2022   PLT 199 02/14/2022    Recent Labs  Lab 02/14/22 1940  NA 128*  K 3.5  CL 93*  CO2 27  BUN 23  CREATININE 1.35*  CALCIUM 8.9  GLUCOSE 131*   Lab Results  Component Value Date   CKTOTAL 168 04/11/2014   CKMB 2.2 04/11/2014   TROPONINI < 0.02 04/12/2014   No results found for: CHOL No results found for: HDL No results found for: LDLCALC No results found for: TRIG No results found for: CHOLHDL No results found for: LDLDIRECT    Radiology: DG Chest 1 View  Result Date: 02/14/2022 CLINICAL DATA:  Recent trip and fall with chest pain, initial encounter EXAM: PORTABLE CHEST 1 VIEW COMPARISON:  04/12/2020 FINDINGS: Cardiac shadow is enlarged but stable. Postsurgical changes are again noted. The lungs are well aerated bilaterally. No focal infiltrate or effusion is seen. No acute bony abnormality is noted. IMPRESSION: No active disease. Electronically Signed   By: Inez Catalina M.D.   On: 02/14/2022 20:16   CT Head Wo Contrast  Result Date: 02/14/2022 CLINICAL DATA:  Recent trip and fall with headaches, initial encounter EXAM: CT HEAD WITHOUT CONTRAST TECHNIQUE: Contiguous axial images were obtained from the base of the skull through the vertex without intravenous contrast. RADIATION DOSE REDUCTION: This exam was performed according to the departmental dose-optimization program which includes automated exposure control, adjustment of the mA and/or kV according to patient size and/or use of iterative reconstruction technique. COMPARISON:  12/31/2021 FINDINGS: Brain: No evidence of acute infarction,  hemorrhage, hydrocephalus, extra-axial collection or mass lesion/mass effect. Stable cystic change is noted in the left posterior fossa stable in appearance from the prior exam. Scattered chronic white matter ischemic changes are noted. Vascular: No hyperdense vessel or unexpected calcification. Skull: Normal. Negative for fracture or focal lesion. Sinuses/Orbits: No acute finding. Other: None. IMPRESSION: Chronic changes without acute abnormality. Electronically Signed   By: Inez Catalina M.D.   On: 02/14/2022 19:59   DG HIP UNILAT WITH PELVIS 2-3 VIEWS LEFT  Result Date: 02/14/2022 CLINICAL DATA:  Recent fall with left hip pain, initial encounter EXAM: DG HIP (WITH OR WITHOUT PELVIS) 3V LEFT COMPARISON:  None. FINDINGS: Pelvic ring is intact. Left subcapital femoral neck fracture is noted with impaction and angulation at the fracture site. No other fractures are seen. Postsurgical changes in the left lower quadrant are noted. IMPRESSION: Left subcapital femoral neck fracture is noted. Electronically Signed   By: Inez Catalina M.D.   On: 02/14/2022 20:15    EKG: Atrial fibrillation rate 60 nonspecific ST-T wave changes  ASSESSMENT AND PLAN:  Preop for hip surgery Atrial fibrillation Chronic renal sufficiency Multivessel coronary disease History of coronary bypass surgery History of PCI and stent placement Hypertension . Plan Recommend hold Coumadin for because of prolonged INR Recommend low-dose vitamin K to reverse anticoagulation Once PT/INR within acceptable range patient is cleared as a mild to moderate risk for hip surgery Continue blood pressure control metoprolol pre and post Continue Multaq for A-fib including metoprolol Agree with simvastatin therapy for lipid management Correct electrolytes hyponatremia Postop resume anticoagulation with Coumadin postop once cleared by surgeon      Signed: Yolonda Kida MD 02/15/2022, 5:27 PM

## 2022-02-15 NOTE — Progress Notes (Signed)
Progress Note   [name] KGU:[id number] DOB: 1941-07-15 DOA: 02/14/2022     1 DOS: the patient was seen and examined on 02/15/2022   Brief hospital course: 81 year old man with past medical history of atrial fibrillation on Coumadin, arthritis, BPH, basal and squamous cell skin cancer, chronic kidney disease and hypertension.  Patient presents after a mechanical fall.  INR 2.5 on 02/15/2022.  Small doses of vitamin K given.  INR will have to be under 1.5 in order to go for hemiarthroplasty.  Assessment and Plan: * Closed left hip fracture ([place])- (present on admission) Secondary to mechanical fall.  Once INR is less than 1.5 there is no contraindications to surgery at this time.  Vitamin K 2.5 mg orally x2 given.  Recheck INR tomorrow morning.  Atrial fibrillation ([place])- (present on admission) Chronic in nature.  S/p ablation in 2022 and followed by cardiac electrophysiology at [facility] and metoprolol for rate control.  Holding Coumadin and giving vitamin K to go to surgery.   Hyponatremia Sodium 128 on presentation.  Hold hydrochlorothiazide.  Discontinue fluids.  Check a urine sodium and urine osmolarity.  Essential (primary) hypertension- (present on admission) Blood pressure very elevated probably secondary to pain.  Currently on Avapro, metoprolol.  Hold hydrochlorothiazide with hyponatremia.  Add hydralazine for now.  Coronary artery disease coronary artery disease s/p CABG 1992 with stents in 2012   BPH (benign prostatic hyperplasia) Continue tamsulosin  Warfarin anticoagulation Holding Coumadin and given 2 small doses of oral vitamin K.  Check INR tomorrow.  CKD (chronic kidney disease), stage III (HCC) CKD stage IIIa.  Creatinine 1.35.  GFR 52        Subjective: Patient had a mechanical fall.  Having a lot of pain in his hip.  Found to have a hip fracture.  No complaints of chest pain or shortness of breath.  Physical Exam: Vitals:    02/15/22 0345 02/15/22 0405 02/15/22 0805 02/15/22 1120  BP: (!) 176/81 (!) 169/78 (!) 192/84 (!) 192/82  Pulse: 75 72 73 65  Resp: 18  16 16   Temp: 98.1 F (36.7 C)  97.9 F (36.6 C) 98.3 F (36.8 C)  TempSrc: Oral     SpO2: 95%  98% 98%  Weight:      Height:       Physical Exam HENT:     Head: Normocephalic.     Mouth/Throat:     Pharynx: No oropharyngeal exudate.  Eyes:     General: Lids are normal.     Conjunctiva/sclera: Conjunctivae normal.  Cardiovascular:     Rate and Rhythm: Normal rate. Rhythm irregularly irregular.     Heart sounds: Normal heart sounds, S1 normal and S2 normal.  Pulmonary:     Breath sounds: No decreased breath sounds, wheezing, rhonchi or rales.  Abdominal:     Palpations: Abdomen is soft.     Tenderness: There is no abdominal tenderness.  Musculoskeletal:     Right lower leg: No swelling.     Left lower leg: No swelling.  Skin:    General: Skin is warm.     Findings: No rash.  Neurological:     Mental Status: He is alert and oriented to person, place, and time.     Comments: Able to wiggle toes and has good sensation bilateral feet.     Data Reviewed: Last INR 2.5, hemoglobin 12.3, creatinine 1.35  Family Communication: Left message for wife  Disposition: Status is: Inpatient Remains inpatient appropriate  because: Will need to go to the operating room to fix hip fracture  Planned Discharge Destination: Rehab   Author: Loletha Grayer, MD 02/15/2022 1:51 PM  For on call review [email or web address].

## 2022-02-15 NOTE — Progress Notes (Signed)
Brief cardiology consult note  Impression Preop hip surgery Multivessel coronary disease History coronary bypass surgery Hypertension Atrial fibrillation Status post ablation for A-fib History of PCI and stent Chronic renal sufficiency stage III  . Plan We will hold Coumadin because of prolonged INR Recommend small doses of vitamin K to help reverse anticoagulation Continue Multaq for A-fib metoprolol for rate Maintain beta-blockers pre and postop Continue blood pressure management and control with olmesartan HCTZ metoprolol Agree with simvastatin/Zetia therapy for hyperlipidemia Patient appears to be an acceptable risk once INR in acceptable range for surgery

## 2022-02-15 NOTE — Consult Note (Signed)
ORTHOPAEDIC CONSULTATION  REQUESTING PHYSICIAN: Loletha Grayer, MD  Chief Complaint: Left femoral neck hip fracture  HPI: Robert Rivas is a 81 y.o. male who complains of left hip pain after mechanical fall yesterday.  Patient is seen in his hospital room with his wife at the bedside today.  Patient states his left hip pain is currently controlled.  Patient dates he tripped coming into the house from the garage.  He states he tripped on a rug causing him to fall injuring his left hip.  Patient's wife explains that he has had issues with balance and had another fall in early January.  He fell backwards sustaining a hematoma on posterior aspect of his head after that fall.  Patient is on Coumadin for atrial fibrillation.  His INR upon admission was 2.6.  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   L upper back 5.0cm inf to base of neck, 5.0cm lat to spine   Hyperlipidemia    Hypertension    MI, old    Nocturia    Phlebitis    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   SCC IS R foreearm distal   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   SCC IS R forearm proximal   Squamous cell carcinoma of skin 03/26/2018   SCC IS R forearm   Squamous cell carcinoma of skin 01/01/2008   SCC IS R forearm   Wears dentures    partial   Past Surgical History:  Procedure Laterality Date   cardaic stents     CARDIAC CATHETERIZATION     PTCA with Stent Placement   CARDIOVERSION N/A 04/13/2021   Procedure: CARDIOVERSION;  Surgeon: Corey Skains, MD;  Location: [facility] ORS;  Service:  Cardiovascular;  Laterality: N/A;   CARDIOVERSION N/A 05/05/2021   Procedure: CARDIOVERSION;  Surgeon: Corey Skains, MD;  Location: [facility] ORS;  Service: Cardiovascular;  Laterality: N/A;   COLONOSCOPY WITH PROPOFOL N/A 12/15/2015   Procedure: COLONOSCOPY WITH PROPOFOL;  Surgeon: Lollie Sails, MD;  [facility] END[facility]COPY;  Service: Endoscopy;  Laterality: N/A;   CORONARY ARTERY BYPASS GRAFT  1992   ECTROPION REPAIR Right 12/12/2017   Procedure: REPAIR OF ECTROPION SUTURES/EXTENSIVE RIGHT;  Surgeon: Karle Starch, MD;  Location: [place];  Service: Ophthalmology;  Laterality: Right;   HERNIA REPAIR     SVT ABLATION  02/22/2017   [facility]   Social History   Socioeconomic History   Marital status: Married    Spouse name: Not on file   Number of children: Not on file   Years of education: Not on file   Highest education level: Not on file  Occupational History   Not on file  Tobacco Use   Smoking status: Never   Smokeless tobacco: Never  Vaping Use   Vaping Use: Never used  Substance and Sexual Activity   Alcohol use: No   Drug use: No   Sexual activity: Not on file  Other  Topics Concern   Not on file  Social History Narrative   Not on file   Social Determinants of Health   Financial Resource Strain: Not on file  Food Insecurity: Not on file  Transportation Needs: Not on file  Physical Activity: Not on file  Stress: Not on file  Social Connections: Not on file   Family History  Problem Relation Age of Onset   Kidney cancer Maternal Uncle    Prostate cancer Neg Hx    Bladder Cancer Neg Hx    Allergies  Allergen Reactions   Codeine Nausea And Vomiting and Nausea Only   Tizanidine Other (See Comments)    drowsiness   Prior to Admission medications   Medication Sig Start Date End Date Taking? Authorizing Provider  cyanocobalamin (,VITAMIN B-12,) 1000 MCG/ML injection Inject 1,000 mcg into the muscle every 30 (thirty) days.    [provider]   docusate sodium (COLACE) 100 MG capsule Take 100 mg by mouth in the morning.    [provider]  dronedarone (MULTAQ) 400 MG tablet Take 400 mg by mouth 2 (two) times daily with a meal.    [provider]  ezetimibe (ZETIA) 10 MG tablet Take 10 mg by mouth at bedtime.     [provider]  hydrochlorothiazide (HYDRODIURIL) 12.5 MG tablet Take 12.5 mg by mouth in the morning. 01/06/21   [provider]  metoprolol tartrate (LOPRESSOR) 25 MG tablet Take 25 mg by mouth 2 (two) times daily.    [provider]  montelukast (SINGULAIR) 10 MG tablet Take 10 mg by mouth at bedtime. 08/12/16   [provider]  nitroGLYCERIN (NITROSTAT) 0.4 MG SL tablet Place 0.4 mg under the tongue every 5 (five) minutes x 3 doses as needed for chest pain. 04/09/14   [provider]  olmesartan (BENICAR) 40 MG tablet Take 40 mg by mouth in the morning and at bedtime.    [provider]  simvastatin (ZOCOR) 20 MG tablet Take 20 mg by mouth at bedtime. 04/24/19   [provider]  solifenacin (VESICARE) 10 MG tablet Take 10 mg by mouth daily. 05/03/21   [provider]  SYRINGE-NEEDLE, DISP, 3 ML 25G X 1" 3 ML MISC Use 1 Syringe monthly. 05/07/15   [provider]  tamsulosin (FLOMAX) 0.4 MG CAPS capsule Take 0.4 mg by mouth at bedtime. 01/28/21   [provider]  warfarin (COUMADIN) 2 MG tablet Take 4 mg by mouth at bedtime. 04/24/19   [provider]   DG Chest 1 View  Result Date: 02/14/2022 CLINICAL DATA:  Recent trip and fall with chest pain, initial encounter EXAM: PORTABLE CHEST 1 VIEW COMPARISON:  04/12/2020 FINDINGS: Cardiac shadow is enlarged but stable. Postsurgical changes are again noted. The lungs are well aerated bilaterally. No focal infiltrate or effusion is seen. No acute bony abnormality is noted. IMPRESSION: No active disease. Electronically Signed   By: Inez Catalina M.D.   On: 02/14/2022 20:16   CT  Head Wo Contrast  Result Date: 02/14/2022 CLINICAL DATA:  Recent trip and fall with headaches, initial encounter EXAM: CT HEAD WITHOUT CONTRAST TECHNIQUE: Contiguous axial images were obtained from the base of the skull through the vertex without intravenous contrast. RADIATION DOSE REDUCTION: This exam was performed according to the departmental dose-optimization program which includes automated exposure control, adjustment of the mA and/or kV according to patient size and/or use of iterative reconstruction technique. COMPARISON:  12/31/2021 FINDINGS: Brain: No evidence of acute  infarction, hemorrhage, hydrocephalus, extra-axial collection or mass lesion/mass effect. Stable cystic change is noted in the left posterior fossa stable in appearance from the prior exam. Scattered chronic white matter ischemic changes are noted. Vascular: No hyperdense vessel or unexpected calcification. Skull: Normal. Negative for fracture or focal lesion. Sinuses/Orbits: No acute finding. Other: None. IMPRESSION: Chronic changes without acute abnormality. Electronically Signed   By: Inez Catalina M.D.   On: 02/14/2022 19:59   DG HIP UNILAT WITH PELVIS 2-3 VIEWS LEFT  Result Date: 02/14/2022 CLINICAL DATA:  Recent fall with left hip pain, initial encounter EXAM: DG HIP (WITH OR WITHOUT PELVIS) 3V LEFT COMPARISON:  None. FINDINGS: Pelvic ring is intact. Left subcapital femoral neck fracture is noted with impaction and angulation at the fracture site. No other fractures are seen. Postsurgical changes in the left lower quadrant are noted. IMPRESSION: Left subcapital femoral neck fracture is noted. Electronically Signed   By: Inez Catalina M.D.   On: 02/14/2022 20:15    Positive ROS: All other systems have been reviewed and were otherwise negative with the exception of those mentioned in the HPI and as above.  Physical Exam: General: Alert, no acute distress  MUSCULOSKELETAL: Left lower extremity: Patient skin is intact.  There  is no erythema ecchymosis or significant swelling.  Patient's thigh and leg compartments are soft and compressible.  Patient has palpable pedal pulses, intact sensation light touch and intact motor function.  His left lower extremity is shortened and externally rotated.  Assessment: Left femoral neck hip fracture  Plan: I reviewed with the patient and his wife today the nature of his hip fracture.  He has a femoral neck hip fracture which is displaced.  I am recommending a hemiarthroplasty and surgical treatment for this fracture.  I discussed the details of the operation as well as the postoperative course with him today.  I discussed the risks and benefits of surgery. The risks include but are not limited to infection, bleeding requiring blood transfusion, nerve or blood vessel injury, joint stiffness or loss of motion, persistent pain, weakness or instability, fracture, dislocation, change in lower extremity rotation or leg length discrepancy hardware failure and the need for further surgery. Medical risks include but are not limited to DVT and pulmonary embolism, myocardial infarction, stroke, pneumonia, respiratory failure and death. Patient understood these risks and wished to proceed.     Thornton Park, MD    02/15/2022 10:52 AM

## 2022-02-15 NOTE — Progress Notes (Signed)
Initial Nutrition Assessment  DOCUMENTATION CODES:   Not applicable  INTERVENTION:  - Recommend diet liberalization from heart healthy to 2gm sodium diet to encourage adequate nutritional intake; spoke with MD  - Ensure Enlive po BID, each supplement provides 350 kcal and 20 grams of protein. - MVI with minerals daily - Recommend SLP evaluation d/t pt reported swallowing difficulties; spoke with MD  NUTRITION DIAGNOSIS:   Increased nutrient needs related to hip fracture as evidenced by estimated needs.  GOAL:   Patient will meet greater than or equal to 90% of their needs  MONITOR:   PO intake, Supplement acceptance, Labs, Weight trends, Skin  REASON FOR ASSESSMENT:   Consult Hip fracture protocol  ASSESSMENT:   Pt admitted from home after tripping over his rug and sustaining a L hip fracture. 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  Plans for hemiarthroplasty of L femoral neck hip fracture once INR below 1.5.  Since pt's fall, he has had a decrease in appetite d/t pain associated with L hip fracture and nausea. He has been experiencing nausea which he is receiving zofran for. Prior to his fall, he reports eating 3-4 meals per day. He and his wife have a light breakfast, he goes to the gym and will come home and have his next meal which typically consists of a sandwich, chips and a cookie. He typically goes to the gym 4 times a week. He endorses, for the last year, episodes of choking during meals which take a few minutes to clear and fluids help with this.  Pt states that usual weight is around 210 lbs and denies recent weight loss. Per review of chart, noted weights trending up within the last year. Current weight noted to be 98.2 kg (216 lbs).   Medication: colace, hydrodiuril  Labs:  sodium  128, Cr 1.35  NUTRITION - FOCUSED PHYSICAL EXAM:  Flowsheet Row Most Recent Value  Orbital Region No depletion  Upper Arm Region Mild depletion  Thoracic and Lumbar Region No depletion  Buccal Region No depletion  Temple Region No depletion  Clavicle Bone Region No depletion  Clavicle and Acromion Bone Region Mild depletion  Scapular Bone Region No depletion  Dorsal Hand Mild depletion  Patellar Region Mild depletion  Anterior Thigh Region Mild depletion  Posterior Calf Region No depletion  Edema (RD Assessment) Mild  [RLE]  Hair Reviewed  Eyes Reviewed  Mouth Reviewed  Skin Reviewed  Nails Reviewed       Diet Order:   Diet Order             Diet regular Room service appropriate? Yes; Fluid consistency: Thin  Diet effective now                   EDUCATION NEEDS:   Education needs have been addressed  Skin:  Skin Assessment: Reviewed RN Assessment  Last BM:  PTA  Height:   Ht Readings from Last 1 Encounters:  02/14/22 6\' 1"  (1.854 m)    Weight:   Wt Readings from Last 1 Encounters:  02/14/22 98.2 kg   BMI:  Body mass index is 28.56 kg/m.  Estimated Nutritional Needs:   Kcal:  2000-2200  Protein:  100-115g  Fluid:  >/=2L  Clayborne Dana, RDN, LDN Clinical Nutrition

## 2022-02-16 DIAGNOSIS — S72002A Fracture of unspecified part of neck of left femur, initial encounter for closed fracture: Secondary | ICD-10-CM | POA: Diagnosis not present

## 2022-02-16 DIAGNOSIS — I4821 Permanent atrial fibrillation: Secondary | ICD-10-CM

## 2022-02-16 LAB — BASIC METABOLIC PANEL
Anion gap: 7 (ref 5–15)
BUN: 19 mg/dL (ref 8–23)
CO2: 29 mmol/L (ref 22–32)
Calcium: 8.9 mg/dL (ref 8.9–10.3)
Chloride: 90 mmol/L — ABNORMAL LOW (ref 98–111)
Creatinine, Ser: 1 mg/dL (ref 0.61–1.24)
GFR, Estimated: >60 mL/min (ref >60)
Glucose, Bld: 110 mg/dL — ABNORMAL HIGH (ref 70–99)
Potassium: 4.7 mmol/L (ref 3.5–5.1)
Sodium: 126 mmol/L — ABNORMAL LOW (ref 135–145)

## 2022-02-16 LAB — TYPE AND SCREEN
ABO/RH(D): B POS
Antibody Screen: NEGATIVE

## 2022-02-16 LAB — PROTIME-INR
INR: 2.1 — ABNORMAL HIGH (ref 0.8–1.2)
INR: 1.4 — ABNORMAL HIGH (ref 0.8–1.2)
Prothrombin Time: 23.2 seconds — ABNORMAL HIGH (ref 11.4–15.2)
Prothrombin Time: 17.1 seconds — ABNORMAL HIGH (ref 11.4–15.2)

## 2022-02-16 LAB — CBC
HCT: 34.6 % — ABNORMAL LOW (ref 39.0–52.0)
Hemoglobin: 11.9 g/dL — ABNORMAL LOW (ref 13.0–17.0)
MCH: 33.3 pg (ref 26.0–34.0)
MCHC: 34.4 g/dL (ref 30.0–36.0)
MCV: 96.9 fL (ref 80.0–100.0)
Platelets: 173 10*3/uL (ref 150–400)
RBC: 3.57 MIL/uL — ABNORMAL LOW (ref 4.22–5.81)
RDW: 12 % (ref 11.5–15.5)
WBC: 6.5 10*3/uL (ref 4.0–10.5)
nRBC: 0 % (ref 0.0–0.2)

## 2022-02-16 LAB — APTT: aPTT: 47 seconds — ABNORMAL HIGH (ref 24–36)

## 2022-02-16 LAB — ABO/RH: ABO/RH(D): B POS

## 2022-02-16 MED ORDER — CEFAZOLIN SODIUM-DEXTROSE 2-4 GM/100ML-% IV SOLN
2.0000 g | INTRAVENOUS | Status: AC
  Administered 2022-02-19: 2 g via INTRAVENOUS

## 2022-02-16 MED ORDER — VITAMIN K1 10 MG/ML IJ SOLN
10.0000 mg | Freq: Once | INTRAVENOUS | Status: AC
  Administered 2022-02-16: 10 mg via INTRAVENOUS
  Filled 2022-02-16: qty 1

## 2022-02-16 MED ORDER — SODIUM CHLORIDE 0.9 % IV SOLN: INTRAVENOUS | Status: DC

## 2022-02-16 NOTE — Progress Notes (Signed)
°  Progress Note   [name] ONG:[id number] DOB: [birth year] DOA: 02/14/2022     2 DOS: the patient was seen and examined on 02/16/2022   Brief hospital course: 81 year old man with past medical history of atrial fibrillation on Coumadin, arthritis, BPH, basal and squamous cell skin cancer, chronic kidney disease and hypertension.  Patient presents after a mechanical fall.  INR 2.5 on 02/15/2022.  Small doses of vitamin K given.  INR will have to be under 1.5 in order to go for hemiarthroplasty.  Assessment and Plan: * Closed left hip fracture ([place])- (present on admission) Discussed with orthopedics, surgery is rescheduled for tomorrow due to elevated INR. N.p.o. after midnight..  Hyponatremia Sodium 128 on presentation.  Dropped down to 126 today.  Patient was n.p.o. last night, will restart fluids with normal saline at 50 mL/h.  Start fluid restriction.  BPH (benign prostatic hyperplasia) Continue tamsulosin  Warfarin anticoagulation Holding Coumadin.  Received 2 doses of oral vitamin K, INR still 2.1 today, not able to go to surgery today.  Orthopedist ordered 10 mg of vitamin K IV, will recheck PT/INR tomorrow.  Essential (primary) hypertension- (present on admission) Discontinue hydrochlorothiazide.  Stage 3a chronic kidney disease (CKD) ([place]) Renal functions better than baseline.  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  No acute coronary syndrome.   Atrial fibrillation ([place])- (present on admission) Continue beta-blocker, hold off anticoagulation..         Subjective:  Patient doing well today, denies any short of breath or cough.  Good appetite.  Physical Exam: Vitals:   02/16/22 0409 02/16/22 0554 02/16/22 0818 02/16/22 1137  BP: (!) 184/90 (!) 163/89 (!) 155/74 (!) 144/77  Pulse: 64 61 (!) 56 (!) 57  Resp: 18  18 18   Temp: 97.9 F (36.6 C)  98.5 F (36.9 C) 97.9 F (36.6 C)  TempSrc: Oral     SpO2: 100%   96% 100%  Weight:      Height:       General exam: Appears calm and comfortable  Respiratory system: Clear to auscultation. Respiratory effort normal. Cardiovascular system: S1 & S2 heard, RRR. No JVD, murmurs, rubs, gallops or clicks. No pedal edema. Gastrointestinal system: Abdomen is nondistended, soft and nontender. No organomegaly or masses felt. Normal bowel sounds heard. Central nervous system: Alert and oriented. No focal neurological deficits. Extremities: Symmetric 5 x 5 power. Skin: No rashes, lesions or ulcers Psychiatry: Judgement and insight appear normal. Mood & affect appropriate.    Data Reviewed: Reviewed the CT head, x-ray of hip, reviewed the lab. Family Communication:   Disposition: Status is: Inpatient Remains inpatient appropriate because: Severity of disease, pending inpatient procedure.          Planned Discharge Destination: Home     Time spent: 28 minutes  Author: Sharen Hones, MD 02/16/2022 12:14 PM  For on call review [email or web address].

## 2022-02-16 NOTE — Progress Notes (Signed)
Subjective:  Patient complaining of moderate left hip pain.  He is sitting up in bed eating dinner.  Patient's INR this morning was 2.1.  His 10 mg IV vitamin K order that I placed yesterday was canceled and he was given only 5 mg of p.o. vitamin K so the decrease in INR was not as much as expected.  Patient was reordered today for 10 mg IV vitamin K to help with bring INR level down so surgery to be performed.   Objective:   VITALS:   Vitals:   02/16/22 0554 02/16/22 0818 02/16/22 1137 02/16/22 1536  BP: (!) 163/89 (!) 155/74 (!) 144/77 (!) 145/76  Pulse: 61 (!) 56 (!) 57 (!) 55  Resp:  18 18 18   Temp:  98.5 F (36.9 C) 97.9 F (36.6 C) 99.1 F (37.3 C)  TempSrc:      SpO2:  96% 100% 100%  Weight:      Height:        PHYSICAL EXAM: Left lower extremity: Patient's left lower extremity is shortened and externally rotated.  He has palpable pedal pulses and intact sensation light touch.  The patient can dorsiflex and plantarflex his ankle and flex and extend his toes.  The skin overlying the left hip is intact without ecchymosis or significant swelling.  His thigh compartments are soft and compressible.   LABS  Results for orders placed or performed during the hospital encounter of 02/14/22 (from the past 24 hour(s))  Protime-INR     Status: Abnormal   Collection Time: 02/16/22  4:03 AM  Result Value Ref Range   Prothrombin Time 23.2 (H) 11.4 - 15.2 seconds   INR 2.1 (H) 0.8 - 1.2  APTT     Status: Abnormal   Collection Time: 02/16/22  4:03 AM  Result Value Ref Range   aPTT 47 (H) 24 - 36 seconds  Basic metabolic panel     Status: Abnormal   Collection Time: 02/16/22  4:03 AM  Result Value Ref Range   Sodium 126 (L) 135 - 145 mmol/L   Potassium 4.7 3.5 - 5.1 mmol/L   Chloride 90 (L) 98 - 111 mmol/L   CO2 29 22 - 32 mmol/L   Glucose, Bld 110 (H) 70 - 99 mg/dL   BUN 19 8 - 23 mg/dL   Creatinine, Ser 1.00 0.61 - 1.24 mg/dL   Calcium 8.9 8.9 - 10.3 mg/dL   GFR, Estimated  >60 >60 mL/min   Anion gap 7 5 - 15  CBC     Status: Abnormal   Collection Time: 02/16/22  4:03 AM  Result Value Ref Range   WBC 6.5 4.0 - 10.5 K/uL   RBC 3.57 (L) 4.22 - 5.81 MIL/uL   Hemoglobin 11.9 (L) 13.0 - 17.0 g/dL   HCT 34.6 (L) 39.0 - 52.0 %   MCV 96.9 80.0 - 100.0 fL   MCH 33.3 26.0 - 34.0 pg   MCHC 34.4 30.0 - 36.0 g/dL   RDW 12.0 11.5 - 15.5 %   Platelets 173 150 - 400 K/uL   nRBC 0.0 0.0 - 0.2 %  ABO/Rh     Status: None   Collection Time: 02/16/22  4:03 AM  Result Value Ref Range   ABO/RH(D)      B POS Performed at [facility], [address] [place], [place] [address]   Type and screen     Status: None   Collection Time: 02/16/22  2:33 PM  Result Value Ref  Range   ABO/RH(D) B POS    Antibody Screen NEG    Sample Expiration      02/19/2022,2359 Performed at [facility], [address] [place], [place] [address]     DG Chest 1 View  Result Date: 02/14/2022 CLINICAL DATA:  Recent trip and fall with chest pain, initial encounter EXAM: PORTABLE CHEST 1 VIEW COMPARISON:  04/12/2020 FINDINGS: Cardiac shadow is enlarged but stable. Postsurgical changes are again noted. The lungs are well aerated bilaterally. No focal infiltrate or effusion is seen. No acute bony abnormality is noted. IMPRESSION: No active disease. Electronically Signed   By: Inez Catalina M.D.   On: 02/14/2022 20:16   CT Head Wo Contrast  Result Date: 02/14/2022 CLINICAL DATA:  Recent trip and fall with headaches, initial encounter EXAM: CT HEAD WITHOUT CONTRAST TECHNIQUE: Contiguous axial images were obtained from the base of the skull through the vertex without intravenous contrast. RADIATION DOSE REDUCTION: This exam was performed according to the departmental dose-optimization program which includes automated exposure control, adjustment of the mA and/or kV according to patient size and/or use of iterative reconstruction technique. COMPARISON:  12/31/2021 FINDINGS: Brain: No  evidence of acute infarction, hemorrhage, hydrocephalus, extra-axial collection or mass lesion/mass effect. Stable cystic change is noted in the left posterior fossa stable in appearance from the prior exam. Scattered chronic white matter ischemic changes are noted. Vascular: No hyperdense vessel or unexpected calcification. Skull: Normal. Negative for fracture or focal lesion. Sinuses/Orbits: No acute finding. Other: None. IMPRESSION: Chronic changes without acute abnormality. Electronically Signed   By: Inez Catalina M.D.   On: 02/14/2022 19:59   DG HIP UNILAT WITH PELVIS 2-3 VIEWS LEFT  Result Date: 02/14/2022 CLINICAL DATA:  Recent fall with left hip pain, initial encounter EXAM: DG HIP (WITH OR WITHOUT PELVIS) 3V LEFT COMPARISON:  None. FINDINGS: Pelvic ring is intact. Left subcapital femoral neck fracture is noted with impaction and angulation at the fracture site. No other fractures are seen. Postsurgical changes in the left lower quadrant are noted. IMPRESSION: Left subcapital femoral neck fracture is noted. Electronically Signed   By: Inez Catalina M.D.   On: 02/14/2022 20:15    Assessment/Plan:     Principal Problem:   Closed left hip fracture (HCC) Active Problems:   Atrial fibrillation (HCC)   Coronary artery disease   Stage 3a chronic kidney disease (CKD) (HCC)   Essential (primary) hypertension   Warfarin anticoagulation   BPH (benign prostatic hyperplasia)   Hyponatremia  Patient will have his INR rechecked this evening and again tomorrow morning to monitor his INR.  Consent order is placed on the chart.  He is tentatively scheduled for left hip hemiarthroplasty tomorrow in the late morning.  Patient will be n.p.o. after midnight preparation for surgery.    Thornton Park , MD 02/16/2022, 5:45 PM

## 2022-02-16 NOTE — Subjective & Objective (Signed)
Progress Note   [name] BMW:[id number] DOB: [birth year] DOA: 02/14/2022     2 DOS: the patient was seen and examined on 02/16/2022   Brief hospital course: 81 year old man with past medical history of atrial fibrillation on Coumadin, arthritis, BPH, basal and squamous cell skin cancer, chronic kidney disease and hypertension.  Patient presents after a mechanical fall.  INR 2.5 on 02/15/2022.  Small doses of vitamin K given.  INR will have to be under 1.5 in order to go for hemiarthroplasty.  Assessment and Plan: * Closed left hip fracture ([place])- (present on admission) Discussed with orthopedics, surgery is rescheduled for tomorrow due to elevated INR. N.p.o. after midnight..  Hyponatremia Sodium 128 on presentation.  Dropped down to 126 today.  Patient was n.p.o. last night, will restart fluids with normal saline at 50 mL/h.  Start fluid restriction.  BPH (benign prostatic hyperplasia) Continue tamsulosin  Warfarin anticoagulation Holding Coumadin.  Received 2 doses of oral vitamin K, INR still 2.1 today, not able to go to surgery today.  Orthopedist ordered 10 mg of vitamin K IV, will recheck PT/INR tomorrow.  Essential (primary) hypertension- (present on admission) Discontinue hydrochlorothiazide.  Stage 3a chronic kidney disease (CKD) ([place]) Renal functions better than baseline.  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  No acute coronary syndrome.   Atrial fibrillation ([place])- (present on admission) Continue beta-blocker, hold off anticoagulation..       {Tip this will not be part of the note when signed Body mass index is 28.56 kg/m. ,  Nutrition Documentation    Flowsheet Row ED to Hosp-Admission (Current) from 02/14/2022 in [place] (1A)  Nutrition Problem Increased nutrient needs  Etiology hip fracture  Nutrition Goal Patient will meet greater than or equal to 90% of their  needs  Interventions Ensure Enlive (each supplement provides 350kcal and 20 grams of protein), MVI, Refer to RD note for recommendations     ,  (Optional):26781}  Subjective:  Patient feels well today, he is n.p.o. pending surgery.  No abdominal pain nausea vomiting. Denies any short of breath or cough.  Physical Exam: Vitals:   02/15/22 2134 02/16/22 0409 02/16/22 0554 02/16/22 0818  BP:  (!) 184/90 (!) 163/89 (!) 155/74  Pulse: 67 64 61 (!) 56  Resp:  18  18  Temp:  97.9 F (36.6 C)  98.5 F (36.9 C)  TempSrc:  Oral    SpO2:  100%  96%  Weight:      Height:       General exam: Appears calm and comfortable  Respiratory system: Clear to auscultation. Respiratory effort normal. Cardiovascular system: S1 & S2 heard, RRR. No JVD, murmurs, rubs, gallops or clicks. No pedal edema. Gastrointestinal system: Abdomen is nondistended, soft and nontender. No organomegaly or masses felt. Normal bowel sounds heard. Central nervous system: Alert and oriented. No focal neurological deficits. Extremities: Symmetric 5 x 5 power. Skin: No rashes, lesions or ulcers Psychiatry: Judgement and insight appear normal. Mood & affect appropriate.    Data Reviewed: Reviewed CT head, no acute changes. Reviewed left hip x-ray, confirmed fracture. Reviewed chest x-ray, no active disease per Reviewed labs, sodium still low at 126, creatinine is better.  Family Communication:   Disposition: Status is: Inpatient Remains inpatient appropriate because: Severity of disease, pending inpatient procedure.          {Tip this will not be part of the note when signed  DVT Prophylaxis  .,  Scds       (Optional):26781}   Time spent: 28 minutes  Author: Sharen Hones, MD 02/16/2022 11:30 AM  For on call review [email or web address].

## 2022-02-16 NOTE — Evaluation (Addendum)
Clinical/Bedside Swallow Evaluation Patient Details  Name: JP EASTHAM MRN: [id number] Date of Birth: July 06, 1941  Today's Date: 02/16/2022 Time: SLP Start Time (ACUTE ONLY): 1145 SLP Stop Time (ACUTE ONLY): 1200 SLP Time Calculation (min) (ACUTE ONLY): 15 min  Past Medical History:  Past Medical History:  Diagnosis Date   A-fib ([place])    Arthritis    lower back   Basal cell carcinoma 10/29/2020   Right post base of skull/neck   BPH (benign prostatic hyperplasia)    Cancer (HCC)    Skin Cancer   Chronic kidney disease    Chronic Kidney Disease   Coronary artery disease    Dysrhythmia    Atrial Fibrillation; Supraventricular Tachycardia   Gout    Hearing aid worn    has, does not wear   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   L upper back 5.0cm inf to base of neck, 5.0cm lat to spine   Hyperlipidemia    Hypertension    MI, old    Nocturia    Phlebitis    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   SCC IS R foreearm distal   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   SCC IS R forearm proximal   Squamous cell carcinoma of skin 03/26/2018   SCC IS R forearm   Squamous cell carcinoma of skin 01/01/2008   SCC IS R forearm   Wears dentures    partial   Past Surgical History:  Past Surgical History:  Procedure Laterality Date   cardaic stents     CARDIAC CATHETERIZATION     PTCA with Stent Placement   CARDIOVERSION N/A 04/13/2021   Procedure: CARDIOVERSION;  Surgeon: Corey Skains, MD;  Location: [facility] ORS;  Service: Cardiovascular;  Laterality: N/A;   CARDIOVERSION N/A 05/05/2021   Procedure: CARDIOVERSION;  Surgeon: Corey Skains, MD;  Location: [facility] ORS;  Service: Cardiovascular;  Laterality: N/A;   COLONOSCOPY WITH PROPOFOL N/A 12/15/2015   Procedure: COLONOSCOPY WITH PROPOFOL;  Surgeon: Lollie Sails, MD;  Location: [facility] ENDOSCOPY;  Service: Endoscopy;  Laterality: N/A;   CORONARY ARTERY BYPASS GRAFT  1992   ECTROPION REPAIR Right 12/12/2017   Procedure:  REPAIR OF ECTROPION SUTURES/EXTENSIVE RIGHT;  Surgeon: Karle Starch, MD;  Location: [facility];  Service: Ophthalmology;  Laterality: Right;   HERNIA REPAIR     SVT ABLATION  02/22/2017   [facility]   HPI:  SYE SCHROEPFER is a 81 y.o. male with medical history significant of PSVT s/p ablation 2018, frequent PVCs, ectopic atrioventricular node tachycardia, paroxysmal atrial fibrillation status post ablation 2022 on warfarin and amiodarone, followed [facility] EP at [facility], paroxysmal atrial flutter, coronary artery disease s/p CABG 1992 with stents in 2012, , HTN, CKD 3, BPH, gout, CAD,  who presents to the ED with acute left hip pain sustained after he fell onto his left hip after tripping over a rug.  He did not hit his head and denies injury to anywhere but the left leg.  He was previously in his usual state of health.  He denies preceding palpitations, lightheadedness, chest pain or shortness of breath or preceding one-sided weakness numbness tingling, headache or visual disturbance. Per dietician note, "He endorses, for the last year, episodes of choking during meals which take a few minutes to clear and fluids help with this." Pt currently on a regular solids and thin liquid diet. Pt agreeable to completion of bedside swallow assessment; spouse present for duration of session.  Assessment / Plan / Recommendation  Clinical Impression  Pt presents with grossly functional oropharyngeal swallow. No s/sx of aspiration across trials of thin liquids via straw and regular solids. Oral phase grossly functional for oral manipulation and clearance across repeat trials of regular solids. Pt deferring additional trials given concern for nausea and lack of appetite. Spouse reporting pt with intermittent choking episodes that are triggered with solids or liquids, often at night when pt is in recliner, and take extended time for recovery. Pt/spouse denied choking incident since admission to hospital. Education provided  regarding aspiration risk in the setting of deconditioning, aspiration precautions, and monitoring for need for rest to aid endurance with meal. Pt/spouse reported understanding.   Recommend continued regular solids and thin liquids with aspirations precautions (slow rate, small bites, elevated HOB, and alert for PO intake). Plan to follow up to ensure carryover of aspiration precautions and monitor for choking episode. Pt/spouse aware and in agreement to POC. SLP Visit Diagnosis: Dysphagia, oropharyngeal phase (R13.12)    Aspiration Risk  Mild aspiration risk    Diet Recommendation     Medication Administration: Whole meds with liquid    Other  Recommendations Oral Care Recommendations: Oral care BID    Recommendations for follow up therapy are one component of a multi-disciplinary discharge planning process, led by the attending physician.  Recommendations may be updated based on patient status, additional functional criteria and insurance authorization.  Follow up Recommendations Other (comment) (TBD)      Assistance Recommended at Discharge    Functional Status Assessment Patient has had a recent decline in their functional status and demonstrates the ability to make significant improvements in function in a reasonable and predictable amount of time.  Frequency and Duration min 1 x/week  2 weeks       Prognosis Prognosis for Safe Diet Advancement: Good      Swallow Study   General Date of Onset: 02/16/22 HPI: SHANKAR SILBER is a 81 y.o. male with medical history significant of PSVT s/p ablation 2018, frequent PVCs, ectopic atrioventricular node tachycardia, paroxysmal atrial fibrillation status post ablation 2022 on warfarin and amiodarone, [facility] EP [facility], paroxysmal atrial flutter, coronary artery disease s/p CABG 1992 with stents in 2012, , HTN, CKD 3, BPH, gout, CAD,  who presents to the ED with acute left hip pain sustained after he fell onto his left hip after  tripping over a rug.  He did not hit his head and denies injury to anywhere but the left leg.  He was previously in his usual state of health.  He denies preceding palpitations, lightheadedness, chest pain or shortness of breath or preceding one-sided weakness numbness tingling, headache or visual disturbance. Per dietician note, "He endorses, for the last year, episodes of choking during meals which take a few minutes to clear and fluids help with this." Pt currently on a regular solids and thin liquid diet. Pt agreeable to completion of bedside swallow assessment; spouse present for duration of session. Type of Study: Bedside Swallow Evaluation Previous Swallow Assessment: none known Diet Prior to this Study: Regular;Thin liquids Temperature Spikes Noted: No History of Recent Intubation: No Behavior/Cognition: Alert;Cooperative Oral Cavity Assessment: Within Functional Limits Oral Care Completed by SLP: No Oral Cavity - Dentition: Adequate natural dentition Vision: Functional for self-feeding Self-Feeding Abilities: Able to feed self Patient Positioning: Upright in bed Baseline Vocal Quality: Normal;Low vocal intensity Volitional Cough: Strong    Oral/Motor/Sensory Function Overall Oral Motor/Sensory Function: Within functional limits  Ice Chips Ice chips: Not tested   Thin Liquid Thin Liquid: Within functional limits Presentation: Cup    Nectar Thick Nectar Thick Liquid: Not tested   Honey Thick Honey Thick Liquid: Not tested   Puree Puree: Not tested   Solid   [place] Arnice Vanepps Clapp MS CCC-SLP Solid: Within functional limits Presentation: Self Fed      [place] J Clapp 02/16/2022,12:38 PM

## 2022-02-16 NOTE — Progress Notes (Signed)
[facility] Cardiology    SUBJECTIVE: States he feels reasonably well no significant pain awaiting surgery INR still too prolonged is hoping to have the procedure soon.  Denies any palpitations or tachycardia   Vitals:   02/16/22 0554 02/16/22 0818 02/16/22 1137 02/16/22 1536  BP: (!) 163/89 (!) 155/74 (!) 144/77 (!) 145/76  Pulse: 61 (!) 56 (!) 57 (!) 55  Resp:  18 18 18   Temp:  98.5 F (36.9 C) 97.9 F (36.6 C) 99.1 F (37.3 C)  TempSrc:      SpO2:  96% 100% 100%  Weight:      Height:         Intake/Output Summary (Last 24 hours) at 02/16/2022 1602 Last data filed at 02/16/2022 0413 Gross per 24 hour  Intake --  Output 250 ml  Net -250 ml      PHYSICAL EXAM  General: Well developed, well nourished, in no acute distress HEENT:  Normocephalic and atramatic Neck:  No JVD.  Lungs: Clear bilaterally to auscultation and percussion. Heart: Irregular irregular. Normal S1 and S2 without gallops or murmurs.  Abdomen: Bowel sounds are positive, abdomen soft and non-tender  Msk:  Back normal, normal gait. Normal strength and tone for age. Extremities: No clubbing, cyanosis or edema.  Left fractured hip Neuro: Alert and oriented X 3. Psych:  Good affect, responds appropriately   LABS: Basic Metabolic Panel: Recent Labs    02/14/22 1940 02/16/22 0403  NA 128* 126*  K 3.5 4.7  CL 93* 90*  CO2 27 29  GLUCOSE 131* 110*  BUN 23 19  CREATININE 1.35* 1.00  CALCIUM 8.9 8.9   Liver Function Tests: No results for input(s): AST, ALT, ALKPHOS, BILITOT, PROT, ALBUMIN in the last 72 hours. No results for input(s): LIPASE, AMYLASE in the last 72 hours. CBC: Recent Labs    02/14/22 1940 02/16/22 0403  WBC 3.7* 6.5  NEUTROABS 2.1  --   HGB 12.3* 11.9*  HCT 35.8* 34.6*  MCV 97.5 96.9  PLT 199 173   Cardiac Enzymes: No results for input(s): CKTOTAL, CKMB, CKMBINDEX, TROPONINI in the last 72 hours. BNP: Invalid input(s): POCBNP D-Dimer: No results for input(s): DDIMER in the  last 72 hours. Hemoglobin A1C: No results for input(s): HGBA1C in the last 72 hours. Fasting Lipid Panel: No results for input(s): CHOL, HDL, LDLCALC, TRIG, CHOLHDL, LDLDIRECT in the last 72 hours. Thyroid Function Tests: No results for input(s): TSH, T4TOTAL, T3FREE, THYROIDAB in the last 72 hours.  Invalid input(s): FREET3 Anemia Panel: No results for input(s): VITAMINB12, FOLATE, FERRITIN, TIBC, IRON, RETICCTPCT in the last 72 hours.  DG Chest 1 View  Result Date: 02/14/2022 CLINICAL DATA:  Recent trip and fall with chest pain, initial encounter EXAM: PORTABLE CHEST 1 VIEW COMPARISON:  04/12/2020 FINDINGS: Cardiac shadow is enlarged but stable. Postsurgical changes are again noted. The lungs are well aerated bilaterally. No focal infiltrate or effusion is seen. No acute bony abnormality is noted. IMPRESSION: No active disease. Electronically Signed   By: Inez Catalina M.D.   On: 02/14/2022 20:16   CT Head Wo Contrast  Result Date: 02/14/2022 CLINICAL DATA:  Recent trip and fall with headaches, initial encounter EXAM: CT HEAD WITHOUT CONTRAST TECHNIQUE: Contiguous axial images were obtained from the base of the skull through the vertex without intravenous contrast. RADIATION DOSE REDUCTION: This exam was performed according to the departmental dose-optimization program which includes automated exposure control, adjustment of the mA and/or kV according to patient size and/or use of  iterative reconstruction technique. COMPARISON:  12/31/2021 FINDINGS: Brain: No evidence of acute infarction, hemorrhage, hydrocephalus, extra-axial collection or mass lesion/mass effect. Stable cystic change is noted in the left posterior fossa stable in appearance from the prior exam. Scattered chronic white matter ischemic changes are noted. Vascular: No hyperdense vessel or unexpected calcification. Skull: Normal. Negative for fracture or focal lesion. Sinuses/Orbits: No acute finding. Other: None. IMPRESSION:  Chronic changes without acute abnormality. Electronically Signed   By: Inez Catalina M.D.   On: 02/14/2022 19:59   DG HIP UNILAT WITH PELVIS 2-3 VIEWS LEFT  Result Date: 02/14/2022 CLINICAL DATA:  Recent fall with left hip pain, initial encounter EXAM: DG HIP (WITH OR WITHOUT PELVIS) 3V LEFT COMPARISON:  None. FINDINGS: Pelvic ring is intact. Left subcapital femoral neck fracture is noted with impaction and angulation at the fracture site. No other fractures are seen. Postsurgical changes in the left lower quadrant are noted. IMPRESSION: Left subcapital femoral neck fracture is noted. Electronically Signed   By: Inez Catalina M.D.   On: 02/14/2022 20:15       TELEMETRY: Atrial fibrillation nonspecific ST changes rate of around 70:  ASSESSMENT AND PLAN:  Principal Problem:   Closed left hip fracture (HCC) Active Problems:   Atrial fibrillation (HCC)   Coronary artery disease   Stage 3a chronic kidney disease (CKD) (HCC)   Essential (primary) hypertension   Warfarin anticoagulation   BPH (benign prostatic hyperplasia)   Hyponatremia    Plan Preop for hip surgery Anticoagulated with Coumadin prolonged PT/INR recommend additional dose of vitamin K Continue adequate hydration for renal insufficiency Atrial fibrillation appears to be reasonably rate controlled continue current therapy Continue to hold long-term anticoagulation in favor of short-term prior to and after surgery Patient is acceptable risk for hip replacement once INR less than 1.6   Yolonda Kida, MD 02/16/2022 4:02 PM

## 2022-02-17 ENCOUNTER — Inpatient Hospital Stay: Payer: Medicare Other

## 2022-02-17 DIAGNOSIS — E871 Hypo-osmolality and hyponatremia: Secondary | ICD-10-CM | POA: Diagnosis not present

## 2022-02-17 DIAGNOSIS — I4821 Permanent atrial fibrillation: Secondary | ICD-10-CM | POA: Diagnosis not present

## 2022-02-17 DIAGNOSIS — S72002A Fracture of unspecified part of neck of left femur, initial encounter for closed fracture: Secondary | ICD-10-CM | POA: Diagnosis not present

## 2022-02-17 LAB — CBC WITH DIFFERENTIAL/PLATELET
Abs Immature Granulocytes: 0.01 10*3/uL (ref 0.00–0.07)
Basophils Absolute: 0 10*3/uL (ref 0.0–0.1)
Basophils Relative: 0 %
Eosinophils Absolute: 0.1 10*3/uL (ref 0.0–0.5)
Eosinophils Relative: 1 %
HCT: 34.3 % — ABNORMAL LOW (ref 39.0–52.0)
Hemoglobin: 12.1 g/dL — ABNORMAL LOW (ref 13.0–17.0)
Immature Granulocytes: 0 %
Lymphocytes Relative: 8 %
Lymphs Abs: 0.6 10*3/uL — ABNORMAL LOW (ref 0.7–4.0)
MCH: 33.7 pg (ref 26.0–34.0)
MCHC: 35.3 g/dL (ref 30.0–36.0)
MCV: 95.5 fL (ref 80.0–100.0)
Monocytes Absolute: 1.1 10*3/uL — ABNORMAL HIGH (ref 0.1–1.0)
Monocytes Relative: 16 %
Neutro Abs: 5.1 10*3/uL (ref 1.7–7.7)
Neutrophils Relative %: 75 %
Platelets: 168 10*3/uL (ref 150–400)
RBC: 3.59 MIL/uL — ABNORMAL LOW (ref 4.22–5.81)
RDW: 11.9 % (ref 11.5–15.5)
WBC: 6.9 10*3/uL (ref 4.0–10.5)
nRBC: 0 % (ref 0.0–0.2)

## 2022-02-17 LAB — BASIC METABOLIC PANEL
Anion gap: 6 (ref 5–15)
Anion gap: 9 (ref 5–15)
BUN: 20 mg/dL (ref 8–23)
BUN: 18 mg/dL (ref 8–23)
CO2: 28 mmol/L (ref 22–32)
CO2: 28 mmol/L (ref 22–32)
Calcium: 8.2 mg/dL — ABNORMAL LOW (ref 8.9–10.3)
Calcium: 8.4 mg/dL — ABNORMAL LOW (ref 8.9–10.3)
Chloride: 88 mmol/L — ABNORMAL LOW (ref 98–111)
Chloride: 90 mmol/L — ABNORMAL LOW (ref 98–111)
Creatinine, Ser: 1.08 mg/dL (ref 0.61–1.24)
Creatinine, Ser: 1.02 mg/dL (ref 0.61–1.24)
GFR, Estimated: >60 mL/min (ref >60)
GFR, Estimated: >60 mL/min (ref >60)
Glucose, Bld: 106 mg/dL — ABNORMAL HIGH (ref 70–99)
Glucose, Bld: 98 mg/dL (ref 70–99)
Potassium: 3.9 mmol/L (ref 3.5–5.1)
Potassium: 3.9 mmol/L (ref 3.5–5.1)
Sodium: 122 mmol/L — ABNORMAL LOW (ref 135–145)
Sodium: 127 mmol/L — ABNORMAL LOW (ref 135–145)

## 2022-02-17 LAB — SODIUM
Sodium: 125 mmol/L — ABNORMAL LOW (ref 135–145)
Sodium: 128 mmol/L — ABNORMAL LOW (ref 135–145)

## 2022-02-17 LAB — TSH: TSH: 10.727 u[IU]/mL — ABNORMAL HIGH (ref 0.350–4.500)

## 2022-02-17 LAB — HEPATIC FUNCTION PANEL
ALT: 23 U/L (ref 0–44)
AST: 22 U/L (ref 15–41)
Albumin: 3.4 g/dL — ABNORMAL LOW (ref 3.5–5.0)
Alkaline Phosphatase: 35 U/L — ABNORMAL LOW (ref 38–126)
Bilirubin, Direct: 0.2 mg/dL (ref 0.0–0.2)
Indirect Bilirubin: 1 mg/dL — ABNORMAL HIGH (ref 0.3–0.9)
Total Bilirubin: 1.2 mg/dL (ref 0.3–1.2)
Total Protein: 6 g/dL — ABNORMAL LOW (ref 6.5–8.1)

## 2022-02-17 LAB — PROTIME-INR
INR: 1.3 — ABNORMAL HIGH (ref 0.8–1.2)
Prothrombin Time: 16 seconds — ABNORMAL HIGH (ref 11.4–15.2)

## 2022-02-17 LAB — CORTISOL: Cortisol, Plasma: 18.9 ug/dL

## 2022-02-17 LAB — MAGNESIUM: Magnesium: 2 mg/dL (ref 1.7–2.4)

## 2022-02-17 LAB — OSMOLALITY: Osmolality: 265 mOsm/kg — ABNORMAL LOW (ref 275–295)

## 2022-02-17 MED ORDER — TOLVAPTAN 15 MG PO TABS
15.0000 mg | ORAL_TABLET | Freq: Once | ORAL | Status: AC
  Administered 2022-02-17: 15 mg via ORAL
  Filled 2022-02-17: qty 1

## 2022-02-17 MED ORDER — SODIUM CHLORIDE 3 % IV SOLN: INTRAVENOUS | Status: DC

## 2022-02-17 MED ORDER — ENOXAPARIN SODIUM 40 MG/0.4ML IJ SOSY
40.0000 mg | PREFILLED_SYRINGE | Freq: Every day | INTRAMUSCULAR | Status: DC
  Administered 2022-02-17 – 2022-02-18 (×2): 40 mg via SUBCUTANEOUS
  Filled 2022-02-17 (×2): qty 0.4

## 2022-02-17 MED ORDER — HALOPERIDOL LACTATE 5 MG/ML IJ SOLN
2.0000 mg | INTRAMUSCULAR | Status: DC | PRN
  Administered 2022-02-17 (×2): 2 mg via INTRAVENOUS
  Filled 2022-02-17 (×2): qty 1

## 2022-02-17 MED ORDER — QUETIAPINE FUMARATE 25 MG PO TABS
25.0000 mg | ORAL_TABLET | Freq: Every day | ORAL | Status: DC
  Administered 2022-02-17 – 2022-02-21 (×5): 25 mg via ORAL
  Filled 2022-02-17 (×5): qty 1

## 2022-02-17 MED ORDER — HYDROCODONE-ACETAMINOPHEN 5-325 MG PO TABS
1.0000 | ORAL_TABLET | ORAL | Status: DC | PRN
  Administered 2022-02-17 – 2022-02-19 (×5): 2 via ORAL
  Filled 2022-02-17 (×5): qty 2

## 2022-02-17 MED ORDER — IOHEXOL 300 MG/ML  SOLN
75.0000 mL | Freq: Once | INTRAMUSCULAR | Status: AC | PRN
  Administered 2022-02-17: 75 mL via INTRAVENOUS

## 2022-02-17 NOTE — Progress Notes (Addendum)
Subjective:  Saw patient in his hospital room just now.  Nurses and respiratory therapy at the bedside.  Patient just recently had a episode of choking.  Patient currently denies shortness of breath, trouble breathing or chest pain.  Patient is able to talk and answer questions.  Patient's wife was just outside the room.  Patient's surgery was canceled today due to hyponatremia.  Objective:   VITALS:   Vitals:   02/16/22 2228 02/17/22 0400 02/17/22 0753 02/17/22 1102  BP: (!) 166/95 (!) 155/86 (!) 152/75 (!) 183/86  Pulse: 70 66 62 66  Resp:  17 15   Temp:  98.8 F (37.1 C) 98.2 F (36.8 C) (!) 97.5 F (36.4 C)  TempSrc:  Oral Oral   SpO2:   97% 99%  Weight:      Height:        PHYSICAL EXAM: Left lower extremity: Skin remains intact over the left hip without erythema ecchymosis or swelling.  His thigh compartments are soft and compressible.  Distally he is neurovascular intact.   LABS  Results for orders placed or performed during the hospital encounter of 02/14/22 (from the past 24 hour(s))  Type and screen     Status: None   Collection Time: 02/16/22  2:33 PM  Result Value Ref Range   ABO/RH(D) B POS    Antibody Screen NEG    Sample Expiration      02/19/2022,2359 Performed at [facility], [address] [place], [place] [address]   Protime-INR     Status: Abnormal   Collection Time: 02/16/22  6:20 PM  Result Value Ref Range   Prothrombin Time 17.1 (H) 11.4 - 15.2 seconds   INR 1.4 (H) 0.8 - 1.2  Basic metabolic panel     Status: Abnormal   Collection Time: 02/17/22  3:08 AM  Result Value Ref Range   Sodium 122 (L) 135 - 145 mmol/L   Potassium 3.9 3.5 - 5.1 mmol/L   Chloride 88 (L) 98 - 111 mmol/L   CO2 28 22 - 32 mmol/L   Glucose, Bld 106 (H) 70 - 99 mg/dL   BUN 20 8 - 23 mg/dL   Creatinine, Ser 1.08 0.61 - 1.24 mg/dL   Calcium 8.2 (L) 8.9 - 10.3 mg/dL   GFR, Estimated >60 >60 mL/min   Anion gap 6 5 - 15  Magnesium     Status: None    Collection Time: 02/17/22  3:08 AM  Result Value Ref Range   Magnesium 2.0 1.7 - 2.4 mg/dL  CBC with Differential/Platelet     Status: Abnormal   Collection Time: 02/17/22  3:08 AM  Result Value Ref Range   WBC 6.9 4.0 - 10.5 K/uL   RBC 3.59 (L) 4.22 - 5.81 MIL/uL   Hemoglobin 12.1 (L) 13.0 - 17.0 g/dL   HCT 34.3 (L) 39.0 - 52.0 %   MCV 95.5 80.0 - 100.0 fL   MCH 33.7 26.0 - 34.0 pg   MCHC 35.3 30.0 - 36.0 g/dL   RDW 11.9 11.5 - 15.5 %   Platelets 168 150 - 400 K/uL   nRBC 0.0 0.0 - 0.2 %   Neutrophils Relative % 75 %   Neutro Abs 5.1 1.7 - 7.7 K/uL   Lymphocytes Relative 8 %   Lymphs Abs 0.6 (L) 0.7 - 4.0 K/uL   Monocytes Relative 16 %   Monocytes Absolute 1.1 (H) 0.1 - 1.0 K/uL   Eosinophils Relative 1 %   Eosinophils Absolute 0.1  0.0 - 0.5 K/uL   Basophils Relative 0 %   Basophils Absolute 0.0 0.0 - 0.1 K/uL   Immature Granulocytes 0 %   Abs Immature Granulocytes 0.01 0.00 - 0.07 K/uL  Protime-INR     Status: Abnormal   Collection Time: 02/17/22  3:08 AM  Result Value Ref Range   Prothrombin Time 16.0 (H) 11.4 - 15.2 seconds   INR 1.3 (H) 0.8 - 1.2  TSH     Status: Abnormal   Collection Time: 02/17/22  8:41 AM  Result Value Ref Range   TSH 10.727 (H) 0.350 - 4.500 uIU/mL  Sodium     Status: Abnormal   Collection Time: 02/17/22  8:41 AM  Result Value Ref Range   Sodium 125 (L) 135 - 145 mmol/L  BASELINE basic metabolic panel - IF NOT already drawn     Status: Abnormal   Collection Time: 02/17/22  9:53 AM  Result Value Ref Range   Sodium 127 (L) 135 - 145 mmol/L   Potassium 3.9 3.5 - 5.1 mmol/L   Chloride 90 (L) 98 - 111 mmol/L   CO2 28 22 - 32 mmol/L   Glucose, Bld 98 70 - 99 mg/dL   BUN 18 8 - 23 mg/dL   Creatinine, Ser 1.02 0.61 - 1.24 mg/dL   Calcium 8.4 (L) 8.9 - 10.3 mg/dL   GFR, Estimated >60 >60 mL/min   Anion gap 9 5 - 15  Hepatic function panel (Baseline)     Status: Abnormal   Collection Time: 02/17/22  9:53 AM  Result Value Ref Range   Total  Protein 6.0 (L) 6.5 - 8.1 g/dL   Albumin 3.4 (L) 3.5 - 5.0 g/dL   AST 22 15 - 41 U/L   ALT 23 0 - 44 U/L   Alkaline Phosphatase 35 (L) 38 - 126 U/L   Total Bilirubin 1.2 0.3 - 1.2 mg/dL   Bilirubin, Direct 0.2 0.0 - 0.2 mg/dL   Indirect Bilirubin 1.0 (H) 0.3 - 0.9 mg/dL    CT CHEST W CONTRAST  Result Date: 02/17/2022 CLINICAL DATA:  Hyponatremia.  Evaluate for lung mass. EXAM: CT CHEST WITH CONTRAST TECHNIQUE: Multidetector CT imaging of the chest was performed during intravenous contrast administration. RADIATION DOSE REDUCTION: This exam was performed according to the departmental dose-optimization program which includes automated exposure control, adjustment of the mA and/or kV according to patient size and/or use of iterative reconstruction technique. CONTRAST:  68mL OMNIPAQUE IOHEXOL 300 MG/ML  SOLN COMPARISON:  Chest radiograph 02/14/2022 and chest CT 05/17/2016 FINDINGS: Cardiovascular: Enlargement of the aortic root at the sinuses of Valsalva measuring up to 4.6 cm. Ascending thoracic aorta measures 4.5 cm and measured 4.4 cm in 2017. Post CABG changes. Left circumflex bypass graft is stented. Heart is enlarged, particularly the right atrium. No significant pericardial effusion. Main pulmonary artery measures 3.7 cm. No large filling defects in the main or central pulmonary arteries. Typical three-vessel arch anatomy. The great vessels are patent. Mid descending thoracic aorta measures 2.6 cm with atherosclerotic calcifications. At least mild stenosis near the origin of the celiac trunk. Mediastinum/Nodes: Small hiatal hernia. No mediastinal, hilar or axillary lymph node enlargement. Lungs/Pleura: Mild scarring at the lung apices. New focal pleural thickening or nodularity along the posterior right upper lobe on sequence 3, image 31. This area measures up to 8 mm. Patchy pleural-based densities in the posterior right lower lobe are most compatible with atelectasis or scarring. Elongated nodular  structure in the posterior left lung on  sequence 2 image 55 measures roughly 5 mm and this appears to be new. Stable 4 mm nodule in the left upper lobe on sequence 3, image 35. Slightly increased posterior pleural thickening bilaterally. Question trace left pleural fluid. Upper Abdomen: Large fluid attenuating structure near the left hepatic dome is suggestive for a large cyst that measures up to 5.8 cm and previously measured 3.6 cm. Additional small hepatic low-density structures are suggestive for additional cysts. 3 mm stone in the right kidney upper pole. Musculoskeletal: Compression fracture along the superior endplate of R83 is new since the prior imaging. This fracture is age indeterminate. The other thoracic vertebral body heights are maintained. IMPRESSION: 1. Increased pleural thickening since 2017. New focal pleural nodularity in the posterior right upper chest measures 8 mm and indeterminate. This could represent scarring but recommend follow-up to ensure stability. Additional small nodular structures as described. Non-contrast chest CT at 3-6 months is recommended. If the nodules are stable at time of repeat CT, then future CT at 18-24 months (from today's scan) is considered optional for low-risk patients, but is recommended for high-risk patients. This recommendation follows the consensus statement: Guidelines for Management of Incidental Pulmonary Nodules Detected on CT Images: From the Fleischner Society 2017; Radiology 2017; 284:228-243. 2. 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 Aortic root measures 4.6 cm. Ascending thoracic aortic aneurysm. Recommend semi-annual imaging followup by CTA or MRA and referral to cardiothoracic surgery if not already obtained. This recommendation follows 2010 ACCF/AHA/AATS/ACR/ASA/SCA/SCAI/SIR/STS/SVM Guidelines for the Diagnosis and Management of Patients With Thoracic Aortic Disease. Circulation. 2010; 121:  E940-H680. Aortic aneurysm NOS (ICD10-I71.9) 3. Post CABG changes with cardiomegaly. 4. Hepatic cysts. 5. Small right renal calculus. 6. Age-indeterminate T12 vertebral body compression fracture. Electronically Signed   By: Markus Daft M.D.   On: 02/17/2022 10:49    Assessment/Plan: * Surgery Date in Future *   Principal Problem:   Closed left hip fracture (HCC) Active Problems:   Coronary artery disease   Stage 3a chronic kidney disease (CKD) (HCC)   Essential (primary) hypertension   Warfarin anticoagulation   Paroxysmal atrial fibrillation (HCC)   BPH (benign prostatic hyperplasia)   Hyponatremia  Respiratory therapy has recommended a chest x-ray to check for aspiration.  Patient is having his sodium repleted.  I explained to the patient and his wife that surgery is postponed.  I am putting the case on for first thing Saturday morning which will give the patient time to have his sodium repleted.  His INR today was 1.3.  Patient may be ordered for a short acting anticoagulation like Lovenox or heparin per the medical service.  Patient will be n.p.o. after midnight on Friday night.  Surgery is anticipated for first thing Saturday morning.  Patient is wife understood and agreed with this plan.    Thornton Park , MD 02/17/2022, 11:06 AM

## 2022-02-17 NOTE — Consult Note (Signed)
[facility]  CONSULT NOTE    Date: 02/17/2022                  Patient Name:  Robert Rivas  MRN: [id number]  DOB: Jun 09, 1941  Age / Sex: 81 y.o., male         PCP: Idelle Crouch, MD                 Service Requesting Consult: [place]                 Reason for Consult: Hyponatremia            History of Present Illness: [name] is a 81 y.o.  male with paroxysmal atrial fibrillation with ablation on warfarin and amiodarone, hypertension, BPH, CAD, and chronic kidney disease stage III, who was admitted to [facility] on 02/14/2022 for Hip fracture ([place]) [S72.009A] Fall with injury [W19.XXXA] Closed left hip fracture ([place]) [S72.002A] Closed fracture of neck of left femur, initial encounter [facility]) [S72.002A]  Patient presents to the emergency department after sustaining a fall resulting in left hip pain.  Patient states he tripped and fell over a rug.  Denies any other injury to person.  Denies nausea, vomiting, or diarrhea.  Tolerating meals prior to admission.  Denies recent illness.  Denies use of smoking or other tobacco products.  Reports medication compliance, including hydrochlorothiazide.  Sodium on admission 128 and has declined as low as 122 early this morning.  Chest x-ray shows enlarged cardiac shadow.  CT head shows hepatic cysts, a small right renal calculus and a thickening or nodularity in the right upper lobe.   Medications: Outpatient medications: Medications Prior to Admission  Medication Sig Dispense Refill Last Dose   amiodarone (PACERONE) 200 MG tablet Take 200 mg by mouth daily.   02/14/2022   docusate sodium (COLACE) 100 MG capsule Take 100 mg by mouth in the morning.   02/14/2022   dronedarone (MULTAQ) 400 MG tablet Take 400 mg by mouth 2 (two) times daily with a meal.   02/14/2022   hydrALAZINE (APRESOLINE) 50 MG tablet Take 50 mg by mouth 2 (two) times daily.   02/14/2022   hydrochlorothiazide (HYDRODIURIL) 12.5 MG tablet Take 12.5 mg  by mouth in the morning.   02/14/2022   metoprolol tartrate (LOPRESSOR) 25 MG tablet Take 25 mg by mouth 2 (two) times daily.   02/14/2022   olmesartan (BENICAR) 40 MG tablet Take 40 mg by mouth in the morning and at bedtime.   02/14/2022   solifenacin (VESICARE) 10 MG tablet Take 10 mg by mouth daily.   02/14/2022   cyanocobalamin (,VITAMIN B-12,) 1000 MCG/ML injection Inject 1,000 mcg into the muscle every 30 (thirty) days. (Patient not taking: Reported on 02/15/2022)   Not Taking   ezetimibe (ZETIA) 10 MG tablet Take 10 mg by mouth at bedtime.    02/13/2022   montelukast (SINGULAIR) 10 MG tablet Take 10 mg by mouth at bedtime.   02/13/2022   nitroGLYCERIN (NITROSTAT) 0.4 MG SL tablet Place 0.4 mg under the tongue every 5 (five) minutes x 3 doses as needed for chest pain.   prn at prn   simvastatin (ZOCOR) 20 MG tablet Take 20 mg by mouth at bedtime.   02/13/2022   SYRINGE-NEEDLE, DISP, 3 ML 25G X 1" 3 ML MISC Use 1 Syringe monthly.      tamsulosin (FLOMAX) 0.4 MG CAPS capsule Take 0.4 mg by mouth at bedtime.  02/13/2022   warfarin (COUMADIN) 2 MG tablet Take 4 mg by mouth at bedtime.   02/13/2022 at 2030    Current medications: Current Facility-Administered Medications  Medication Dose Route Frequency Provider Last Rate Last Admin   benzonatate (TESSALON) capsule 100 mg  100 mg Oral TID PRN Loletha Grayer, MD   100 mg at 02/17/22 1011   ceFAZolin (ANCEF) IVPB 2g/100 mL premix  2 g Intravenous 30 min Pre-Op [place], MD       Chlorhexidine Gluconate Cloth 2 % PADS 6 each  6 each Topical Daily Athena Masse, MD   6 each at 02/17/22 1019   docusate sodium (COLACE) capsule 100 mg  100 mg Oral q AM Loletha Grayer, MD   100 mg at 02/16/22 0554   dronedarone (MULTAQ) tablet 400 mg  400 mg Oral BID WC Loletha Grayer, MD   400 mg at 02/17/22 1016   enoxaparin (LOVENOX) injection 40 mg  40 mg Subcutaneous Daily Sharen Hones, MD   40 mg at 02/17/22 1119   ezetimibe (ZETIA) tablet 10 mg  10 mg  Oral QHS Wieting, Richard, MD   10 mg at 02/16/22 2227   feeding supplement (ENSURE ENLIVE / ENSURE PLUS) liquid 237 mL  237 mL Oral BID BM Wieting, Richard, MD       hydrALAZINE (APRESOLINE) tablet 10 mg  10 mg Oral Q8H Wieting, Richard, MD   10 mg at 02/16/22 2228   HYDROcodone-acetaminophen (NORCO/VICODIN) 5-325 MG per tablet 1-2 tablet  1-2 tablet Oral Q6H PRN Athena Masse, MD   2 tablet at 02/17/22 1120   HYDROmorphone (DILAUDID) injection 0.5 mg  0.5 mg Intravenous Q2H PRN Athena Masse, MD   0.5 mg at 02/17/22 8588   irbesartan (AVAPRO) tablet 300 mg  300 mg Oral Daily Loletha Grayer, MD   300 mg at 02/17/22 1010   metoprolol tartrate (LOPRESSOR) tablet 25 mg  25 mg Oral BID Loletha Grayer, MD   25 mg at 02/17/22 1011   montelukast (SINGULAIR) tablet 10 mg  10 mg Oral QHS Loletha Grayer, MD   10 mg at 02/16/22 2229   multivitamin with minerals tablet 1 tablet  1 tablet Oral Daily Loletha Grayer, MD   1 tablet at 02/17/22 1011   mupirocin ointment (BACTROBAN) 2 % 1 application  1 application Nasal BID Athena Masse, MD   1 application at 50/27/74 1019   nitroGLYCERIN (NITROSTAT) SL tablet 0.4 mg  0.4 mg Sublingual Q5 Min x 3 PRN Loletha Grayer, MD       ondansetron [facility]) injection 4 mg  4 mg Intravenous Q6H PRN Loletha Grayer, MD   4 mg at 02/16/22 1659   simvastatin (ZOCOR) tablet 10 mg  10 mg Oral QHS Loletha Grayer, MD   10 mg at 02/16/22 2229   tamsulosin (FLOMAX) capsule 0.4 mg  0.4 mg Oral QHS Loletha Grayer, MD   0.4 mg at 02/16/22 2229      Allergies: Allergies  Allergen Reactions   Codeine Nausea And Vomiting and Nausea Only   Tizanidine Other (See Comments)    drowsiness      Past Medical History: Past Medical History:  Diagnosis Date   A-fib ([place])    Arthritis    lower back   Basal cell carcinoma 10/29/2020   Right post base of skull/neck   BPH (benign prostatic hyperplasia)    Cancer (HCC)    Skin Cancer   Chronic kidney disease     Chronic  Kidney Disease   Coronary artery disease    Dysrhythmia    Atrial Fibrillation; Supraventricular Tachycardia   Gout    Hearing aid worn    has, does not wear   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   L upper back 5.0cm inf to base of neck, 5.0cm lat to spine   Hyperlipidemia    Hypertension    MI, old    Nocturia    Phlebitis    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   SCC IS R foreearm distal   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   SCC IS R forearm proximal   Squamous cell carcinoma of skin 03/26/2018   SCC IS R forearm   Squamous cell carcinoma of skin 01/01/2008   SCC IS R forearm   Wears dentures    partial     Past Surgical History: Past Surgical History:  Procedure Laterality Date   cardaic stents     CARDIAC CATHETERIZATION     PTCA with Stent Placement   CARDIOVERSION N/A 04/13/2021   Procedure: CARDIOVERSION;  Surgeon: Corey Skains, MD;  Location: [facility] ORS;  Service: Cardiovascular;  Laterality: N/A;   CARDIOVERSION N/A 05/05/2021   Procedure: CARDIOVERSION;  Surgeon: Corey Skains, MD;  Location: [facility] ORS;  Service: Cardiovascular;  Laterality: N/A;   COLONOSCOPY WITH PROPOFOL N/A 12/15/2015   Procedure: COLONOSCOPY WITH PROPOFOL;  Surgeon: Lollie Sails, MD;  Location: [facility] ENDOSCOPY;  Service: Endoscopy;  Laterality: N/A;   CORONARY ARTERY BYPASS GRAFT  1992   ECTROPION REPAIR Right 12/12/2017   Procedure: REPAIR OF ECTROPION SUTURES/EXTENSIVE RIGHT;  Surgeon: Karle Starch, MD;  Location: [place];  Service: Ophthalmology;  Laterality: Right;   HERNIA REPAIR     SVT ABLATION  02/22/2017   [facility]     Family History: Family History  Problem Relation Age of Onset   Kidney cancer Maternal Uncle    Prostate cancer Neg Hx    Bladder Cancer Neg Hx      Social History: Social History   Socioeconomic History   Marital status: Married    Spouse name: Not on file   Number of children: Not on file   Years of  education: Not on file   Highest education level: Not on file  Occupational History   Not on file  Tobacco Use   Smoking status: Never   Smokeless tobacco: Never  Vaping Use   Vaping Use: Never used  Substance and Sexual Activity   Alcohol use: No   Drug use: No   Sexual activity: Not on file  Other Topics Concern   Not on file  Social History Narrative   Not on file   Social Determinants of Health   Financial Resource Strain: Not on file  Food Insecurity: Not on file  Transportation Needs: Not on file  Physical Activity: Not on file  Stress: Not on file  Social Connections: Not on file  Intimate Partner Violence: Not on file     Review of Systems: Review of Systems  Constitutional:  Negative for chills, fever and malaise/fatigue.  HENT:  Negative for congestion, sore throat and tinnitus.   Eyes:  Negative for blurred vision and redness.  Respiratory:  Negative for cough, shortness of breath and wheezing.   Cardiovascular:  Negative for chest pain, palpitations, claudication and leg swelling.  Gastrointestinal:  Negative for abdominal pain, blood in stool, diarrhea, nausea and vomiting.  Genitourinary:  Negative for flank pain, frequency and  hematuria.  Musculoskeletal:  Positive for falls. Negative for back pain and myalgias.  Skin:  Negative for rash.  Neurological:  Negative for dizziness, weakness and headaches.  Endo/Heme/Allergies:  Does not bruise/bleed easily.  Psychiatric/Behavioral:  Negative for depression. The patient is not nervous/anxious and does not have insomnia.    Vital Signs: Blood pressure (!) 183/86, pulse 66, temperature (!) 97.5 F (36.4 C), resp. rate 15, height 6\' 1"  (1.854 m), weight 98.2 kg, SpO2 99 %.  Weight trends: Filed Weights   02/14/22 1932 02/14/22 2200  Weight: 95.3 kg 98.2 kg    Physical Exam: General: NAD  Head: Normocephalic, atraumatic. Moist oral mucosal membranes  Eyes: Anicteric  Lungs:  Clear to auscultation  normal effort  Heart: Regular rate and rhythm  Abdomen:  Soft, nontender  Extremities: Trace peripheral edema.  Neurologic: Nonfocal, moving all four extremities  Skin: No lesions        Lab results: Basic Metabolic Panel: Recent Labs  Lab 02/16/22 0403 02/17/22 0308 02/17/22 0841 02/17/22 0953  NA 126* 122* 125* 127*  K 4.7 3.9  --  3.9  CL 90* 88*  --  90*  CO2 29 28  --  28  GLUCOSE 110* 106*  --  98  BUN 19 20  --  18  CREATININE 1.00 1.08  --  1.02  CALCIUM 8.9 8.2*  --  8.4*  MG  --  2.0  --   --     Liver Function Tests: Recent Labs  Lab 02/17/22 0953  AST 22  ALT 23  ALKPHOS 35*  BILITOT 1.2  PROT 6.0*  ALBUMIN 3.4*   No results for input(s): LIPASE, AMYLASE in the last 168 hours. No results for input(s): AMMONIA in the last 168 hours.  CBC: Recent Labs  Lab 02/14/22 1940 02/16/22 0403 02/17/22 0308  WBC 3.7* 6.5 6.9  NEUTROABS 2.1  --  5.1  HGB 12.3* 11.9* 12.1*  HCT 35.8* 34.6* 34.3*  MCV 97.5 96.9 95.5  PLT 199 173 168    Cardiac Enzymes: No results for input(s): CKTOTAL, CKMB, CKMBINDEX, TROPONINI in the last 168 hours.  BNP: Invalid input(s): POCBNP  CBG: No results for input(s): GLUCAP in the last 168 hours.  Microbiology: Results for orders placed or performed during the hospital encounter of 02/14/22  Resp Panel by RT-PCR (Flu A&B, Covid) Nasopharyngeal Swab     Status: None   Collection Time: 02/14/22  7:40 PM   Specimen: Nasopharyngeal Swab; Nasopharyngeal(NP) swabs in vial transport medium  Result Value Ref Range Status   SARS Coronavirus 2 by RT PCR NEGATIVE NEGATIVE Final    Comment: (NOTE) SARS-CoV-2 target nucleic acids are NOT DETECTED.  The SARS-CoV-2 RNA is generally detectable in upper respiratory specimens during the acute phase of infection. The lowest concentration of SARS-CoV-2 viral copies this assay can detect is 138 copies/mL. A negative result does not preclude SARS-Cov-2 infection and should not be  used as the sole basis for treatment or other patient management decisions. A negative result may occur with  improper specimen collection/handling, submission of specimen other than nasopharyngeal swab, presence of viral mutation(s) within the areas targeted by this assay, and inadequate number of viral copies(<138 copies/mL). A negative result must be combined with clinical observations, patient history, and epidemiological information. The expected result is Negative.  Fact Sheet for Patients:  EntrepreneurPulse.com.au  Fact Sheet for Healthcare Providers:  IncredibleEmployment.be  This test is no t yet approved or cleared by the [place]  FDA and  has been authorized for detection and/or diagnosis of SARS-CoV-2 by FDA under an Emergency Use Authorization (EUA). This EUA will remain  in effect (meaning this test can be used) for the duration of the COVID-19 declaration under Section 564(b)(1) of the Act, 21 U.S.C.section 360bbb-3(b)(1), unless the authorization is terminated  or revoked sooner.       Influenza A by PCR NEGATIVE NEGATIVE Final   Influenza B by PCR NEGATIVE NEGATIVE Final    Comment: (NOTE) The Xpert Xpress SARS-CoV-2/FLU/RSV plus assay is intended as an aid in the diagnosis of influenza from Nasopharyngeal swab specimens and should not be used as a sole basis for treatment. Nasal washings and aspirates are unacceptable for Xpert Xpress SARS-CoV-2/FLU/RSV testing.  Fact Sheet for Patients: EntrepreneurPulse.com.au  Fact Sheet for Healthcare Providers: IncredibleEmployment.be  This test is not yet approved or cleared by the [place] FDA and has been authorized for detection and/or diagnosis of SARS-CoV-2 by FDA under an Emergency Use Authorization (EUA). This EUA will remain in effect (meaning this test can be used) for the duration of the COVID-19 declaration under Section  564(b)(1) of the Act, 21 U.S.C. section 360bbb-3(b)(1), unless the authorization is terminated or revoked.  Performed at [facility], [address] [place], [place] [address]   Surgical PCR screen     Status: Abnormal   Collection Time: 02/14/22 10:51 PM   Specimen: Nasal Mucosa; Nasal Swab  Result Value Ref Range Status   MRSA, PCR POSITIVE (A) NEGATIVE Final    Comment: RESULT CALLED TO, READ BACK BY AND VERIFIED WITH: Luna Fuse [email or web address]  02/15/22 RH    Staphylococcus aureus POSITIVE (A) NEGATIVE Final    Comment: (NOTE) The Xpert SA Assay (FDA approved for NASAL specimens in patients 67 years of age and older), is one component of a comprehensive surveillance program. It is not intended to diagnose infection nor to guide or monitor treatment. Performed at [facility], [address] [place], [place] [address]     Coagulation Studies: Recent Labs    02/16/22 0403 02/16/22 1820 02/17/22 0308  LABPROT 23.2* 17.1* 16.0*  INR 2.1* 1.4* 1.3*    Urinalysis: No results for input(s): COLORURINE, LABSPEC, PHURINE, GLUCOSEU, HGBUR, BILIRUBINUR, KETONESUR, PROTEINUR, UROBILINOGEN, NITRITE, LEUKOCYTESUR in the last 72 hours.  Invalid input(s): APPERANCEUR    Imaging: CT CHEST W CONTRAST  Result Date: 02/17/2022 CLINICAL DATA:  Hyponatremia.  Evaluate for lung mass. EXAM: CT CHEST WITH CONTRAST TECHNIQUE: Multidetector CT imaging of the chest was performed during intravenous contrast administration. RADIATION DOSE REDUCTION: This exam was performed according to the departmental dose-optimization program which includes automated exposure control, adjustment of the mA and/or kV according to patient size and/or use of iterative reconstruction technique. CONTRAST:  44mL OMNIPAQUE IOHEXOL 300 MG/ML  SOLN COMPARISON:  Chest radiograph 02/14/2022 and chest CT 05/17/2016 FINDINGS: Cardiovascular: Enlargement of the aortic root at the sinuses of Valsalva measuring up to  4.6 cm. Ascending thoracic aorta measures 4.5 cm and measured 4.4 cm in 2017. Post CABG changes. Left circumflex bypass graft is stented. Heart is enlarged, particularly the right atrium. No significant pericardial effusion. Main pulmonary artery measures 3.7 cm. No large filling defects in the main or central pulmonary arteries. Typical three-vessel arch anatomy. The great vessels are patent. Mid descending thoracic aorta measures 2.6 cm with atherosclerotic calcifications. At least mild stenosis near the origin of the celiac trunk. Mediastinum/Nodes: Small hiatal hernia. No mediastinal, hilar or axillary lymph node enlargement. Lungs/Pleura: Mild scarring  at the lung apices. New focal pleural thickening or nodularity along the posterior right upper lobe on sequence 3, image 31. This area measures up to 8 mm. Patchy pleural-based densities in the posterior right lower lobe are most compatible with atelectasis or scarring. Elongated nodular structure in the posterior left lung on sequence 2 image 55 measures roughly 5 mm and this appears to be new. Stable 4 mm nodule in the left upper lobe on sequence 3, image 35. Slightly increased posterior pleural thickening bilaterally. Question trace left pleural fluid. Upper Abdomen: Large fluid attenuating structure near the left hepatic dome is suggestive for a large cyst that measures up to 5.8 cm and previously measured 3.6 cm. Additional small hepatic low-density structures are suggestive for additional cysts. 3 mm stone in the right kidney upper pole. Musculoskeletal: Compression fracture along the superior endplate of Z61 is new since the prior imaging. This fracture is age indeterminate. The other thoracic vertebral body heights are maintained. IMPRESSION: 1. Increased pleural thickening since 2017. New focal pleural nodularity in the posterior right upper chest measures 8 mm and indeterminate. This could represent scarring but recommend follow-up to ensure stability.  Additional small nodular structures as described. Non-contrast chest CT at 3-6 months is recommended. If the nodules are stable at time of repeat CT, then future CT at 18-24 months (from today's scan) is considered optional for low-risk patients, but is recommended for high-risk patients. This recommendation follows the consensus statement: Guidelines for Management of Incidental Pulmonary Nodules Detected on CT Images: From the Fleischner Society 2017; Radiology 2017; 284:228-243. 2. 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 Aortic root measures 4.6 cm. Ascending thoracic aortic aneurysm. Recommend semi-annual imaging followup by CTA or MRA and referral to cardiothoracic surgery if not already obtained. This recommendation follows 2010 ACCF/AHA/AATS/ACR/ASA/SCA/SCAI/SIR/STS/SVM Guidelines for the Diagnosis and Management of Patients With Thoracic Aortic Disease. Circulation. 2010; 121: W960-A540. Aortic aneurysm NOS (ICD10-I71.9) 3. Post CABG changes with cardiomegaly. 4. Hepatic cysts. 5. Small right renal calculus. 6. Age-indeterminate T12 vertebral body compression fracture. Electronically Signed   By: Markus Daft M.D.   On: 02/17/2022 10:49     Assessment & Plan: Robert Rivas is a 81 y.o.  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  IV hydration given overnight with little response.  Sodium 128 on admission.  Decreasing to 122 this morning.  Patient also prescribed hydrochlorothiazide outpatient.  Will order follow-up labs including TSH, cortisol, osmolalities, and CT chest with contrast to rule  out lung mass.  We will order tolvaptan 15 mg once and monitor sodium closely.     LOS: 3   2/23/202312:03 PM

## 2022-02-17 NOTE — Progress Notes (Addendum)
I have spoken with pre-op RN and have confirmed that patient will need sodium level to be above 125 prior to surgery. Patient ordered to remain NPO until sodium level recheck.   Fluids have been ordered and there is a possibility of surgery this afternoon. I have updated patient. Patient confirmed understanding.   I have spoken to pharmacist and Dr. Roosevelt Locks. Patient will be transferred to stepdown in order to receive hypertonic fluids prior to surgery.

## 2022-02-17 NOTE — Significant Event (Signed)
Rapid Response Event Note   Reason for Call :  Questionable aspiration, respiratory distress  Initial Focused Assessment:  Rapid response RN arrived in patient's room with patient sitting up in bed. At time of arrival, vital signs being taken and RT assessing lung sounds. Per 1A staff, patient choked and there was concern for aspiration. Patient had strong cough per reports from witnessing staff. Patient reported no distress beside pain from his admitting fracture and no breathing difficulties. RT stated they heard no adventitious lung sounds. Oxygen saturations 99% on 2L nasal cannula and respirations unlabored.  Interventions:  None.  Plan of Care:  Patient to remain on 1A for now. Nurse will pursue swallowing assessment as needed. 1A staff instructed to reach back out to rapid response as needed.  Event Summary:   MD Notified: Dr. Mack Guise Call Time: 10:57 Arrival Time: 10:59 End Time: 11:04  Stephanie Acre, RN  Addendum: rechecked on patient at 11:47, patient resting in bed on room air. Alert, with family at bedside. Reports no breathing difficulties and no needs from rapid response RN

## 2022-02-17 NOTE — Progress Notes (Signed)
RRT initiated due to choking episode heard from nurses station. Patient continued non-productive cough for duration of time. Skin appeared to be flushed and red. Patient Vital signs taken at bedside: BP: 183/86, HR: 66, O2: 99 w/ 2L [place]. Bed position set to high-fowlers/chair. Patient able to answer questions.   RT has recommended chest x-ray in order to evaluate for aspiration. MD has educated patient that his surgery case will be re-scheduled for Saturday morning due to hyponatremia. Patient and patient's wife verified understanding.   I will continue to monitor patient and have educated patient via teach-back method on how to utilize call bell for assistance. I have been provided with RR RN contact information in order to update of any other questions or concerns.

## 2022-02-17 NOTE — Progress Notes (Signed)
[facility]    SUBJECTIVE: Patient reasonably stable no worsening pain sodium is somewhat low but INR is improved denies any new symptoms   Vitals:   02/16/22 2228 02/17/22 0400 02/17/22 0753 02/17/22 1102  BP: (!) 166/95 (!) 155/86 (!) 152/75 (!) 183/86  Pulse: 70 66 62 66  Resp:  17 15   Temp:  98.8 F (37.1 C) 98.2 F (36.8 C) (!) 97.5 F (36.4 C)  TempSrc:  Oral Oral   SpO2:   97% 99%  Weight:      Height:         Intake/Output Summary (Last 24 hours) at 02/17/2022 1217 Last data filed at 02/17/2022 0500 Gross per 24 hour  Intake --  Output 900 ml  Net -900 ml      PHYSICAL EXAM  General: Well developed, well nourished, in no acute distress HEENT:  Normocephalic and atramatic Neck:  No JVD.  Lungs: Clear bilaterally to auscultation and percussion. Heart: Irregular irregular. Normal S1 and S2 without gallops or murmurs.  Abdomen: Bowel sounds are positive, abdomen soft and non-tender  Msk:  Back normal, normal gait. Normal strength and tone for age. Extremities: No clubbing, cyanosis or edema.   Neuro: Alert and oriented X 3. Psych:  Good affect, responds appropriately   LABS: Basic Metabolic Panel: Recent Labs    02/17/22 0308 02/17/22 0841 02/17/22 0953  NA 122* 125* 127*  K 3.9  --  3.9  CL 88*  --  90*  CO2 28  --  28  GLUCOSE 106*  --  98  BUN 20  --  18  CREATININE 1.08  --  1.02  CALCIUM 8.2*  --  8.4*  MG 2.0  --   --    Liver Function Tests: Recent Labs    02/17/22 0953  AST 22  ALT 23  ALKPHOS 35*  BILITOT 1.2  PROT 6.0*  ALBUMIN 3.4*   No results for input(s): LIPASE, AMYLASE in the last 72 hours. CBC: Recent Labs    02/14/22 1940 02/16/22 0403 02/17/22 0308  WBC 3.7* 6.5 6.9  NEUTROABS 2.1  --  5.1  HGB 12.3* 11.9* 12.1*  HCT 35.8* 34.6* 34.3*  MCV 97.5 96.9 95.5  PLT 199 173 168   Cardiac Enzymes: No results for input(s): CKTOTAL, CKMB, CKMBINDEX, TROPONINI in the last 72 hours. BNP: Invalid input(s):  POCBNP D-Dimer: No results for input(s): DDIMER in the last 72 hours. Hemoglobin A1C: No results for input(s): HGBA1C in the last 72 hours. Fasting Lipid Panel: No results for input(s): CHOL, HDL, LDLCALC, TRIG, CHOLHDL, LDLDIRECT in the last 72 hours. Thyroid Function Tests: Recent Labs    02/17/22 0841  TSH 10.727*   Anemia Panel: No results for input(s): VITAMINB12, FOLATE, FERRITIN, TIBC, IRON, RETICCTPCT in the last 72 hours.  CT CHEST W CONTRAST  Result Date: 02/17/2022 CLINICAL DATA:  Hyponatremia.  Evaluate for lung mass. EXAM: CT CHEST WITH CONTRAST TECHNIQUE: Multidetector CT imaging of the chest was performed during intravenous contrast administration. RADIATION DOSE REDUCTION: This exam was performed according to the departmental dose-optimization program which includes automated exposure control, adjustment of the mA and/or kV according to patient size and/or use of iterative reconstruction technique. CONTRAST:  64mL OMNIPAQUE IOHEXOL 300 MG/ML  SOLN COMPARISON:  Chest radiograph 02/14/2022 and chest CT 05/17/2016 FINDINGS: Cardiovascular: Enlargement of the aortic root at the sinuses of Valsalva measuring up to 4.6 cm. Ascending thoracic aorta measures 4.5 cm and measured 4.4 cm in 2017. Post  CABG changes. Left circumflex bypass graft is stented. Heart is enlarged, particularly the right atrium. No significant pericardial effusion. Main pulmonary artery measures 3.7 cm. No large filling defects in the main or central pulmonary arteries. Typical three-vessel arch anatomy. The great vessels are patent. Mid descending thoracic aorta measures 2.6 cm with atherosclerotic calcifications. At least mild stenosis near the origin of the celiac trunk. Mediastinum/Nodes: Small hiatal hernia. No mediastinal, hilar or axillary lymph node enlargement. Lungs/Pleura: Mild scarring at the lung apices. New focal pleural thickening or nodularity along the posterior right upper lobe on sequence 3, image  31. This area measures up to 8 mm. Patchy pleural-based densities in the posterior right lower lobe are most compatible with atelectasis or scarring. Elongated nodular structure in the posterior left lung on sequence 2 image 55 measures roughly 5 mm and this appears to be new. Stable 4 mm nodule in the left upper lobe on sequence 3, image 35. Slightly increased posterior pleural thickening bilaterally. Question trace left pleural fluid. Upper Abdomen: Large fluid attenuating structure near the left hepatic dome is suggestive for a large cyst that measures up to 5.8 cm and previously measured 3.6 cm. Additional small hepatic low-density structures are suggestive for additional cysts. 3 mm stone in the right kidney upper pole. Musculoskeletal: Compression fracture along the superior endplate of U13 is new since the prior imaging. This fracture is age indeterminate. The other thoracic vertebral body heights are maintained. IMPRESSION: 1. Increased pleural thickening since 2017. New focal pleural nodularity in the posterior right upper chest measures 8 mm and indeterminate. This could represent scarring but recommend follow-up to ensure stability. Additional small nodular structures as described. Non-contrast chest CT at 3-6 months is recommended. If the nodules are stable at time of repeat CT, then future CT at 18-24 months (from today's scan) is considered optional for low-risk patients, but is recommended for high-risk patients. This recommendation follows the consensus statement: Guidelines for Management of Incidental Pulmonary Nodules Detected on CT Images: From the Fleischner Society 2017; Radiology 2017; 284:228-243. 2. 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 Aortic root measures 4.6 cm. Ascending thoracic aortic aneurysm. Recommend semi-annual imaging followup by CTA or MRA and referral to cardiothoracic surgery if not already obtained. This recommendation  follows 2010 ACCF/AHA/AATS/ACR/ASA/SCA/SCAI/SIR/STS/SVM Guidelines for the Diagnosis and Management of Patients With Thoracic Aortic Disease. Circulation. 2010; 121: K440-N027. Aortic aneurysm NOS (ICD10-I71.9) 3. Post CABG changes with cardiomegaly. 4. Hepatic cysts. 5. Small right renal calculus. 6. Age-indeterminate T12 vertebral body compression fracture. Electronically Signed   By: Markus Daft M.D.   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)   Essential (primary) hypertension   Warfarin anticoagulation   Paroxysmal atrial fibrillation (HCC)   BPH (benign prostatic hyperplasia)   Hyponatremia   Plan Preop hip surgery postponed because of hyponatremia INR is within acceptable range Hyponatremia should correct sodium above 130 for the procedure Continue pain control for fractured hip Agree with hypertension management and control Continue to hold Coumadin as anticoagulant prior to surgery will restart afterward Maintain atrial fibrillation rate control Chronic renal insufficiency should maintain adequate hydration   Yolonda Kida, MD 02/17/2022 12:17 PM

## 2022-02-17 NOTE — Progress Notes (Addendum)
°  Progress Note   [name] [id number]:[id number] DOB: [birth year] DOA: 02/14/2022     3 DOS: the patient was seen and examined on 02/17/2022   Brief hospital course: 81 year old man with past medical history of atrial fibrillation on Coumadin, arthritis, BPH, basal and squamous cell skin cancer, chronic kidney disease and hypertension.  Patient presents after a mechanical fall.  INR 2.5 on 02/15/2022.  Small doses of vitamin K given.  INR will have to be under 1.5 in order to go for hemiarthroplasty.  Assessment and Plan: * Closed left hip fracture ([place])- (present on admission) Surgery is rescheduled on Saturday due to severe hyponatremia.  Hyponatremia Sodium level further dropped to 122.  Discussed with nephrology, will will give tolvaptan.  Monitor sodium closely.  Surgery will be delayed due to severe hyponatremia.  BPH (benign prostatic hyperplasia) Continue tamsulosin  Warfarin anticoagulation Patient received multiple doses of vitamin K, INR dropped down to 1.3 today. Discussed with orthopedics, surgery will be scheduled on Saturday instead, will start prophylactic Lovenox.  Essential (primary) hypertension- (present on admission) Continue current treatment.  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  No chest pain or elevated troponin.   Atrial fibrillation ([place])- (present on admission) Continue beta-blocker, continue hold anticoagulation.   Dysphagia.  Patient had a choking episode, causing a cough spell. CXR no pneumonia. Will obtain SLP, change diet to mechanical soft. Per wife, patient had been complaining of dysphagia with solid.      Subjective:  Patient doing well today, no nausea vomiting abdominal pain.  He has good appetite No short of breath or cough.  Physical Exam: Vitals:   02/16/22 2029 02/16/22 2228  02/17/22 0400 02/17/22 0753  BP: (!) 166/81 (!) 166/95 (!) 155/86 (!) 152/75  Pulse: 64 70 66 62  Resp: 20  17 15   Temp: 98.8 F (37.1 C)  98.8 F (37.1 C) 98.2 F (36.8 C)  TempSrc:   Oral Oral  SpO2: 90%   97%  Weight:      Height:       General exam: Appears calm and comfortable  Respiratory system: Clear to auscultation. Respiratory effort normal. Cardiovascular system: S1 & S2 heard, RRR. No JVD, murmurs, rubs, gallops or clicks. No pedal edema. Gastrointestinal system: Abdomen is nondistended, soft and nontender. No organomegaly or masses felt. Normal bowel sounds heard. Central nervous system: Alert and oriented x3. No focal neurological deficits. Extremities: Symmetric 5 x 5 power. Skin: No rashes, lesions or ulcers Psychiatry: Judgement and insight appear normal. Mood & affect appropriate.    Data Reviewed: All labs  Family Communication: Daughter updated  Disposition: Status is: Inpatient Remains inpatient appropriate because: Disease and pending procedure.          Planned Discharge Destination: Skilled nursing facility     Time spent: 28 minutes  Author: Sharen Hones, MD 02/17/2022 10:52 AM  For on call review [email or web address].

## 2022-02-17 NOTE — Plan of Care (Signed)
°  Problem: Education: Goal: Knowledge of General Education information will improve Description: Including pain rating scale, medication(s)/side effects and non-pharmacologic comfort measures Outcome: Progressing   Problem: Health Behavior/Discharge Planning: Goal: Ability to manage health-related needs will improve Outcome: Progressing   Problem: Clinical Measurements: Goal: Ability to maintain clinical measurements within normal limits will improve Outcome: Progressing Goal: Will remain free from infection Outcome: Progressing Goal: Diagnostic test results will improve Outcome: Progressing Goal: Respiratory complications will improve Outcome: Progressing Goal: Cardiovascular complication will be avoided Outcome: Progressing   Problem: Activity: Goal: Risk for activity intolerance will decrease Outcome: Progressing   Problem: Nutrition: Goal: Adequate nutrition will be maintained Outcome: Progressing   Problem: Coping: Goal: Level of anxiety will decrease Outcome: Progressing   Problem: Elimination: Goal: Will not experience complications related to bowel motility Outcome: Progressing Goal: Will not experience complications related to urinary retention Outcome: Progressing   Problem: Pain Managment: Goal: General experience of comfort will improve Outcome: Progressing   Problem: Safety: Goal: Ability to remain free from injury will improve Outcome: Progressing   Problem: Skin Integrity: Goal: Risk for impaired skin integrity will decrease Outcome: Progressing   Problem: Skin Integrity: Goal: Risk for impaired skin integrity will decrease Outcome: Progressing   Problem: Safety: Goal: Ability to remain free from injury will improve Outcome: Progressing   Problem: Elimination: Goal: Will not experience complications related to urinary retention Outcome: Progressing   Problem: Elimination: Goal: Will not experience complications related to bowel  motility Outcome: Progressing   Problem: Pain Managment: Goal: General experience of comfort will improve Outcome: Progressing   Problem: Coping: Goal: Level of anxiety will decrease Outcome: Progressing   Problem: Nutrition: Goal: Adequate nutrition will be maintained Outcome: Progressing   Problem: Activity: Goal: Risk for activity intolerance will decrease Outcome: Progressing   Problem: Clinical Measurements: Goal: Cardiovascular complication will be avoided Outcome: Progressing   Problem: Clinical Measurements: Goal: Respiratory complications will improve Outcome: Progressing   Problem: Clinical Measurements: Goal: Diagnostic test results will improve Outcome: Progressing   Problem: Clinical Measurements: Goal: Will remain free from infection Outcome: Progressing   Problem: Clinical Measurements: Goal: Ability to maintain clinical measurements within normal limits will improve Outcome: Progressing   Problem: Health Behavior/Discharge Planning: Goal: Ability to manage health-related needs will improve Outcome: Progressing

## 2022-02-18 DIAGNOSIS — R1310 Dysphagia, unspecified: Secondary | ICD-10-CM

## 2022-02-18 DIAGNOSIS — R410 Disorientation, unspecified: Secondary | ICD-10-CM

## 2022-02-18 DIAGNOSIS — S72002A Fracture of unspecified part of neck of left femur, initial encounter for closed fracture: Secondary | ICD-10-CM | POA: Diagnosis not present

## 2022-02-18 DIAGNOSIS — R1312 Dysphagia, oropharyngeal phase: Secondary | ICD-10-CM

## 2022-02-18 LAB — PROTIME-INR
INR: 1.2 (ref 0.8–1.2)
Prothrombin Time: 15.6 seconds — ABNORMAL HIGH (ref 11.4–15.2)

## 2022-02-18 LAB — SODIUM: Sodium: 128 mmol/L — ABNORMAL LOW (ref 135–145)

## 2022-02-18 MED ORDER — TOLVAPTAN 15 MG PO TABS
15.0000 mg | ORAL_TABLET | Freq: Once | ORAL | Status: AC
  Administered 2022-02-18: 15 mg via ORAL
  Filled 2022-02-18: qty 1

## 2022-02-18 NOTE — Progress Notes (Signed)
Subjective:  Patient seen in his hospital room with his wife at the bedside today.  Patient denies any chest pain, shortness of breath or choking.  Patient has mild to moderate left hip pain.  Patient left hip hemiarthroplasty surgery was canceled yesterday due to hyponatremia.  He had an episode of choking yesterday evening.  No infiltrate or effusion was seen on his chest x-ray.  Patient has pleural nodularity in the posterior right upper chest which could represent scarring per the radiology report.  Patient has an ascending thoracic aortic aneurysm without significant change from 2017.  He is status post CABG with cardiomegaly, hepatic cyst and a small right renal calculus.  There is an age-indeterminate T12 vertebral body compression fracture.  Patient sodium is improved to 128 today.  Objective:   VITALS:   Vitals:   02/17/22 1519 02/17/22 2149 02/18/22 0419 02/18/22 0803  BP: (!) 154/84 (!) 161/71 139/68 (!) 142/62  Pulse: (!) 59 70 70 91  Resp: 17 20 20 20   Temp: 98 F (36.7 C) 99.6 F (37.6 C) 98.5 F (36.9 C) 98.7 F (37.1 C)  TempSrc:    Oral  SpO2: 98% 99% 100% 100%  Weight:      Height:        PHYSICAL EXAM: Left lower extremity: Neurovascular intact Sensation intact distally Intact pulses distally Dorsiflexion/Plantar flexion intact No cellulitis present Compartment soft  LABS  Results for orders placed or performed during the hospital encounter of 02/14/22 (from the past 24 hour(s))  Sodium     Status: Abnormal   Collection Time: 02/17/22  6:13 PM  Result Value Ref Range   Sodium 128 (L) 135 - 145 mmol/L  Sodium     Status: Abnormal   Collection Time: 02/18/22  2:27 AM  Result Value Ref Range   Sodium 128 (L) 135 - 145 mmol/L    CT CHEST W CONTRAST  Result Date: 02/17/2022 CLINICAL DATA:  Hyponatremia.  Evaluate for lung mass. EXAM: CT CHEST WITH CONTRAST TECHNIQUE: Multidetector CT imaging of the chest was performed during intravenous contrast  administration. RADIATION DOSE REDUCTION: This exam was performed according to the departmental dose-optimization program which includes automated exposure control, adjustment of the mA and/or kV according to patient size and/or use of iterative reconstruction technique. CONTRAST:  82mL OMNIPAQUE IOHEXOL 300 MG/ML  SOLN COMPARISON:  Chest radiograph 02/14/2022 and chest CT 05/17/2016 FINDINGS: Cardiovascular: Enlargement of the aortic root at the sinuses of Valsalva measuring up to 4.6 cm. Ascending thoracic aorta measures 4.5 cm and measured 4.4 cm in 2017. Post CABG changes. Left circumflex bypass graft is stented. Heart is enlarged, particularly the right atrium. No significant pericardial effusion. Main pulmonary artery measures 3.7 cm. No large filling defects in the main or central pulmonary arteries. Typical three-vessel arch anatomy. The great vessels are patent. Mid descending thoracic aorta measures 2.6 cm with atherosclerotic calcifications. At least mild stenosis near the origin of the celiac trunk. Mediastinum/Nodes: Small hiatal hernia. No mediastinal, hilar or axillary lymph node enlargement. Lungs/Pleura: Mild scarring at the lung apices. New focal pleural thickening or nodularity along the posterior right upper lobe on sequence 3, image 31. This area measures up to 8 mm. Patchy pleural-based densities in the posterior right lower lobe are most compatible with atelectasis or scarring. Elongated nodular structure in the posterior left lung on sequence 2 image 55 measures roughly 5 mm and this appears to be new. Stable 4 mm nodule in the left upper lobe on sequence 3,  image 35. Slightly increased posterior pleural thickening bilaterally. Question trace left pleural fluid. Upper Abdomen: Large fluid attenuating structure near the left hepatic dome is suggestive for a large cyst that measures up to 5.8 cm and previously measured 3.6 cm. Additional small hepatic low-density structures are suggestive for  additional cysts. 3 mm stone in the right kidney upper pole. Musculoskeletal: Compression fracture along the superior endplate of P71 is new since the prior imaging. This fracture is age indeterminate. The other thoracic vertebral body heights are maintained. IMPRESSION: 1. Increased pleural thickening since 2017. New focal pleural nodularity in the posterior right upper chest measures 8 mm and indeterminate. This could represent scarring but recommend follow-up to ensure stability. Additional small nodular structures as described. Non-contrast chest CT at 3-6 months is recommended. If the nodules are stable at time of repeat CT, then future CT at 18-24 months (from today's scan) is considered optional for low-risk patients, but is recommended for high-risk patients. This recommendation follows the consensus statement: Guidelines for Management of Incidental Pulmonary Nodules Detected on CT Images: From the Fleischner Society 2017; Radiology 2017; 284:228-243. 2. 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 Aortic root measures 4.6 cm. Ascending thoracic aortic aneurysm. Recommend semi-annual imaging followup by CTA or MRA and referral to cardiothoracic surgery if not already obtained. This recommendation follows 2010 ACCF/AHA/AATS/ACR/ASA/SCA/SCAI/SIR/STS/SVM Guidelines for the Diagnosis and Management of Patients With Thoracic Aortic Disease. Circulation. 2010; 121: G626-R485. Aortic aneurysm NOS (ICD10-I71.9) 3. Post CABG changes with cardiomegaly. 4. Hepatic cysts. 5. Small right renal calculus. 6. Age-indeterminate T12 vertebral body compression fracture. Electronically Signed   By: Markus Daft M.D.   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  His sodium today is 128.  He will have a BMP in the morning to confirm stable sodium.  Patient with an INR check today.  Patient will be n.p.o. after midnight.  I reviewed with the patient and his wife today the details of the operation as well as the postoperative course.  We discussed the risks and benefits of surgery.  They understand the risks include but are not limited to infection requiring the removal of prosthesis, bleeding requiring blood transfusion, nerve or blood vessel injury, leg length discrepancy, change in lower extremity rotation, fracture, dislocation, persistent pain and the need for further surgery including conversion to a total hip arthroplasty.  They also understand the medical risks include but are not limited to DVT and pulmonary embolism, myocardial infarction, stroke, pneumonia, respiratory failure and death.  They understood these risks and wished to proceed.    Thornton Park , MD 02/18/2022, 2:02 PM

## 2022-02-18 NOTE — Progress Notes (Signed)
°  Progress Note   [name] WLS:[id number] DOB: 01/29/1941 DOA: 02/14/2022     4 DOS: the patient was seen and examined on 02/18/2022   Brief hospital course: 81 year old man with past medical history of atrial fibrillation on Coumadin, arthritis, BPH, basal and squamous cell skin cancer, chronic kidney disease and hypertension.  Patient presents after a mechanical fall.  INR 2.5 on 02/15/2022.  Small doses of vitamin K given.  INR will have to be under 1.5 in order to go for hemiarthroplasty. Patient developed acute hyponatremia, received tolvaptan.  Patient also developed significant delirium, was given Haldol and Seroquel.  Assessment and Plan: * Closed left hip fracture ([place])- (present on admission) Surgery is rescheduled on Saturday due to severe hyponatremia.  Dysphagia Patient had episode of choking yesterday, she also has some dysphagia at home.  Speech study pending.  Acute delirium Patient did develop significant delirium yesterday, received as needed Haldol, also giving a dose of Seroquel at nighttime.  Condition much improved today.  Hyponatremia Received tolvaptan.  Sodium level gradually improved.  Continue to follow.  BPH (benign prostatic hyperplasia) Continue tamsulosin  Paroxysmal atrial fibrillation ([place])- (present on admission) Continue hold anticoagulation.  Continue beta-blocker.  Warfarin anticoagulation Warfarin on hold due to scheduled surgery on Saturday. Continue prophylactic Lovenox.  Essential (primary) hypertension- (present on admission) Blood pressure is stable.  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  Condition stable.   Atrial fibrillation (HCC) Continue beta-blocker, continue hold anticoagulation.         Subjective:  Patient slept well last night after taking Seroquel.  No longer has any agitation, no  confusion today. Denies any short of breath or cough. No abdominal pain or nausea vomiting.  Physical Exam: Vitals:   02/17/22 1519 02/17/22 2149 02/18/22 0419 02/18/22 0803  BP: (!) 154/84 (!) 161/71 139/68 (!) 142/62  Pulse: (!) 59 70 70 91  Resp: 17 20 20 20   Temp: 98 F (36.7 C) 99.6 F (37.6 C) 98.5 F (36.9 C) 98.7 F (37.1 C)  TempSrc:    Oral  SpO2: 98% 99% 100% 100%  Weight:      Height:       General exam: Appears calm and comfortable  Respiratory system: Clear to auscultation. Respiratory effort normal. Cardiovascular system: S1 & S2 heard, RRR. No JVD, murmurs, rubs, gallops or clicks. No pedal edema. Gastrointestinal system: Abdomen is nondistended, soft and nontender. No organomegaly or masses felt. Normal bowel sounds heard. Central nervous system: Alert and oriented. No focal neurological deficits. Extremities: Symmetric 5 x 5 power. Skin: No rashes, lesions or ulcers Psychiatry: Judgement and insight appear normal. Mood & affect appropriate.    Data Reviewed: Reviewed chest CT scan, no evidence of malignancy. Reviewed all labs.  Family Communication:   Disposition: Status is: Inpatient Remains inpatient appropriate because: Severity of disease, pending procedure.     Planned Discharge Destination: Skilled nursing facility     Time spent: 28 minutes  Author: Sharen Hones, MD 02/18/2022 12:13 PM  For on call review [email or web address].

## 2022-02-18 NOTE — Progress Notes (Addendum)
Speech Language Pathology Treatment: Dysphagia  Patient Details Name: Robert Rivas MRN: [id number] DOB: Jun 24, 1941 Today's Date: 02/18/2022 Time: 1630-1700 SLP Time Calculation (min) (ACUTE ONLY): 30 min  Assessment / Plan / Recommendation Clinical Impression  Pt seen for ongoing assessment of swallowing. He is alert, verbally responsive and engaged in conversation w/ SLP min cues given to direct attention. Pt is on RA; wbc wnl. Partial plate in place. NSG reported good toleration of oral diet today, and pills in puree swallowing, w/ No overt s/s of aspiration noted w/ intake.   Pt did develop significant Delirium yesterday per MD note requiring as needed Haldol; also giving a dose of Seroquel at nighttime per note. MD felt pt's condition was much improved today. Per chart review, pt also has had severe Hyponatremia which is improving also(128 currently). Both of these issues can impact Cognitive status, awareness, and follow through w/ tasks. including swallowing and oral intake. ANY Cognitive decline can impact timing and safety of swallowing of oral intake. A pt must be fully alert, calm and able to engage in po tasks helping to feed self to reduce risk for aspiration. A pt must be able to sit fully Upright also.   Pt explained general aspiration precautions and agreed verbally to the need for following them especially sitting upright for all oral intake -- MUCH support required/given to help achieve full upright sitting; he appeared weak in his core and UEs and was unable to help much at all(requested for OT/PT evals from MD). Pt assisted w/ positioning d/t weakness. He fed himself trials of soft solids and thin liquids via Cup. NO overt clinical s/s of aspiration were noted w/ po's during session; respiratory status remained calm and unlabored, vocal quality clear b/t trials, no coughing occurred. Oral phase appeared [facility] for bolus management and timely A-P transfer for swallowing; oral clearing  achieved w/ all consistencies.    Pt appears at reduced risk for aspriation when following general aspiration precautions including: Sitting Upright, feeding self, Cup drinking, small sips slowly. Recommend continue a Regular/mech soft diet for ease of soft foods w/ gravies added to moisten foods for conservation of energy; Thin liquids via Cup. Recommend general aspiration precautions; Pills Whole vs Crushed in Puree for ease of swallowing; tray setup and positioning assistance for meals. Reduce distractions during meals. REFLUX precautions if ANY indigestion. ST services will sign off at this time w/ MD to reconsult if new needs while admitted. NSG updated. Precautions posted at bedside.   OF NOTE: if pt is having "intermittent episodes of choking that are triggered with solids or liquids, often at night when pt is in recliner", as per chart note(Dietician), then would recommend f/u w/ GI for assessment of Esophageal Dysmotility.        HPI HPI: Robert Rivas is a 81 y.o. male with multiple medical issues including medical history significant of PSVT s/p ablation 2018, frequent PVCs, ectopic atrioventricular node tachycardia, paroxysmal atrial fibrillation status post ablation 2022 on warfarin and amiodarone, followed by EP at [facility], paroxysmal atrial flutter, coronary artery disease s/p CABG 1992 with stents in 2012, ASCVD  w/ CABG, HTN, CKD 3, BPH, gout, CAD,  who presents to the ED with acute left hip pain sustained after he fell onto his left hip after tripping over a rug.  He did not hit his head and denies injury to anywhere but the left leg.  He was previously in his usual state of health.  He denies preceding palpitations, lightheadedness,  chest pain or shortness of breath or preceding one-sided weakness numbness tingling, headache or visual disturbance.  Chest CT: Patchy  pleural-based densities in the posterior right lower lobe are most compatible with atelectasis or scarring. Slightly increased  posterior pleural thickening bilaterally. Question trace left pleural fluid. Mild scarring at the bases.  Previous CXR imaging have been negative in recent years per chart.      SLP Plan  All goals met      Recommendations for follow up therapy are one component of a multi-disciplinary discharge planning process, led by the attending physician.  Recommendations may be updated based on patient status, additional functional criteria and insurance authorization.    Recommendations  Diet recommendations: Dysphagia 3 (mechanical soft);Thin liquid (for conservation of energy) Liquids provided via: Cup;No straw Medication Administration: Whole meds with puree (vs need to Crush with puree) Supervision: Patient able to self feed;Intermittent supervision to cue for compensatory strategies (setup and positioning necessary) Compensations: Minimize environmental distractions;Slow rate;Small sips/bites;Lingual sweep for clearance of pocketing;Follow solids with liquid Postural Changes and/or Swallow Maneuvers: Out of bed for meals;Seated upright 90 degrees;Upright 30-60 min after meal                General recommendations:  (Dietician f/u) Oral Care Recommendations: Oral care BID;Oral care before and after PO;Staff/trained caregiver to provide oral care (partial denture care) Follow Up Recommendations: No SLP follow up (indicated at this time) Assistance recommended at discharge: Intermittent Supervision/Assistance SLP Visit Diagnosis: Dysphagia, unspecified (R13.10) Plan: All goals met             Orinda Kenner, [place], [place]; [place] [phone] (ascom) Molly Maselli  02/18/2022, 6:08 PM

## 2022-02-18 NOTE — Progress Notes (Signed)
[facility]    SUBJECTIVE: Patient states to be doing reasonably well denies any palpitation tachycardia still has left hip discomfort preop for surgery sodium is not better patient INR controlled preserved proceed with surgical correction of hip fracture tomorrow   Vitals:   02/17/22 1519 02/17/22 2149 02/18/22 0419 02/18/22 0803  BP: (!) 154/84 (!) 161/71 139/68 (!) 142/62  Pulse: (!) 59 70 70 91  Resp: 17 20 20 20   Temp: 98 F (36.7 C) 99.6 F (37.6 C) 98.5 F (36.9 C) 98.7 F (37.1 C)  TempSrc:    Oral  SpO2: 98% 99% 100% 100%  Weight:      Height:         Intake/Output Summary (Last 24 hours) at 02/18/2022 1346 Last data filed at 02/18/2022 1037 Gross per 24 hour  Intake 480 ml  Output 1450 ml  Net -970 ml      PHYSICAL EXAM  General: Well developed, well nourished, in no acute distress HEENT:  Normocephalic and atramatic Neck:  No JVD.  Lungs: Clear bilaterally to auscultation and percussion. Heart: HRRR . Normal S1 and S2 without gallops or murmurs.  Abdomen: Bowel sounds are positive, abdomen soft and non-tender  Msk:  Back normal, normal gait. Normal strength and tone for age. Extremities: No clubbing, cyanosis or edema.   Neuro: Alert and oriented X 3. Psych:  Good affect, responds appropriately   LABS: Basic Metabolic Panel: Recent Labs    02/17/22 0308 02/17/22 0841 02/17/22 0953 02/17/22 1813 02/18/22 0227  NA 122*   < > 127* 128* 128*  K 3.9  --  3.9  --   --   CL 88*  --  90*  --   --   CO2 28  --  28  --   --   GLUCOSE 106*  --  98  --   --   BUN 20  --  18  --   --   CREATININE 1.08  --  1.02  --   --   CALCIUM 8.2*  --  8.4*  --   --   MG 2.0  --   --   --   --    < > = values in this interval not displayed.   Liver Function Tests: Recent Labs    02/17/22 0953  AST 22  ALT 23  ALKPHOS 35*  BILITOT 1.2  PROT 6.0*  ALBUMIN 3.4*   No results for input(s): LIPASE, AMYLASE in the last 72 hours. CBC: Recent Labs     02/16/22 0403 02/17/22 0308  WBC 6.5 6.9  NEUTROABS  --  5.1  HGB 11.9* 12.1*  HCT 34.6* 34.3*  MCV 96.9 95.5  PLT 173 168   Cardiac Enzymes: No results for input(s): CKTOTAL, CKMB, CKMBINDEX, TROPONINI in the last 72 hours. BNP: Invalid input(s): POCBNP D-Dimer: No results for input(s): DDIMER in the last 72 hours. Hemoglobin A1C: No results for input(s): HGBA1C in the last 72 hours. Fasting Lipid Panel: No results for input(s): CHOL, HDL, LDLCALC, TRIG, CHOLHDL, LDLDIRECT in the last 72 hours. Thyroid Function Tests: Recent Labs    02/17/22 0841  TSH 10.727*   Anemia Panel: No results for input(s): VITAMINB12, FOLATE, FERRITIN, TIBC, IRON, RETICCTPCT in the last 72 hours.  CT CHEST W CONTRAST  Result Date: 02/17/2022 CLINICAL DATA:  Hyponatremia.  Evaluate for lung mass. EXAM: CT CHEST WITH CONTRAST TECHNIQUE: Multidetector CT imaging of the chest was performed during intravenous contrast administration. RADIATION DOSE  REDUCTION: This exam was performed according to the departmental dose-optimization program which includes automated exposure control, adjustment of the mA and/or kV according to patient size and/or use of iterative reconstruction technique. CONTRAST:  99mL OMNIPAQUE IOHEXOL 300 MG/ML  SOLN COMPARISON:  Chest radiograph 02/14/2022 and chest CT 05/17/2016 FINDINGS: Cardiovascular: Enlargement of the aortic root at the sinuses of Valsalva measuring up to 4.6 cm. Ascending thoracic aorta measures 4.5 cm and measured 4.4 cm in 2017. Post CABG changes. Left circumflex bypass graft is stented. Heart is enlarged, particularly the right atrium. No significant pericardial effusion. Main pulmonary artery measures 3.7 cm. No large filling defects in the main or central pulmonary arteries. Typical three-vessel arch anatomy. The great vessels are patent. Mid descending thoracic aorta measures 2.6 cm with atherosclerotic calcifications. At least mild stenosis near the origin of the  celiac trunk. Mediastinum/Nodes: Small hiatal hernia. No mediastinal, hilar or axillary lymph node enlargement. Lungs/Pleura: Mild scarring at the lung apices. New focal pleural thickening or nodularity along the posterior right upper lobe on sequence 3, image 31. This area measures up to 8 mm. Patchy pleural-based densities in the posterior right lower lobe are most compatible with atelectasis or scarring. Elongated nodular structure in the posterior left lung on sequence 2 image 55 measures roughly 5 mm and this appears to be new. Stable 4 mm nodule in the left upper lobe on sequence 3, image 35. Slightly increased posterior pleural thickening bilaterally. Question trace left pleural fluid. Upper Abdomen: Large fluid attenuating structure near the left hepatic dome is suggestive for a large cyst that measures up to 5.8 cm and previously measured 3.6 cm. Additional small hepatic low-density structures are suggestive for additional cysts. 3 mm stone in the right kidney upper pole. Musculoskeletal: Compression fracture along the superior endplate of L49 is new since the prior imaging. This fracture is age indeterminate. The other thoracic vertebral body heights are maintained. IMPRESSION: 1. Increased pleural thickening since 2017. New focal pleural nodularity in the posterior right upper chest measures 8 mm and indeterminate. This could represent scarring but recommend follow-up to ensure stability. Additional small nodular structures as described. Non-contrast chest CT at 3-6 months is recommended. If the nodules are stable at time of repeat CT, then future CT at 18-24 months (from today's scan) is considered optional for low-risk patients, but is recommended for high-risk patients. This recommendation follows the consensus statement: Guidelines for Management of Incidental Pulmonary Nodules Detected on CT Images: From the Fleischner Society 2017; Radiology 2017; 284:228-243. 2. 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 Aortic root measures 4.6 cm. Ascending thoracic aortic aneurysm. Recommend semi-annual imaging followup by CTA or MRA and referral to cardiothoracic surgery if not already obtained. This recommendation follows 2010 ACCF/AHA/AATS/ACR/ASA/SCA/SCAI/SIR/STS/SVM Guidelines for the Diagnosis and Management of Patients With Thoracic Aortic Disease. Circulation. 2010; 121: Z791-T056. Aortic aneurysm NOS (ICD10-I71.9) 3. Post CABG changes with cardiomegaly. 4. Hepatic cysts. 5. Small right renal calculus. 6. Age-indeterminate T12 vertebral body compression fracture. Electronically Signed   By: Markus Daft M.D.   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)   Essential (primary) hypertension   Warfarin anticoagulation   Paroxysmal atrial fibrillation (  HCC)   BPH (benign prostatic hyperplasia)   Hyponatremia   Acute delirium   Dysphagia    Plan Correct electrolytes especially hyponatremia Continue to hold anticoagulants maintain low INR preop Patient is cleared for operative procedure on his hip from a cardiac standpoint Known coronary disease stable continue conservative management Agree with renal input from nephrology for chronic renal insufficiency Paroxysmal atrial fibrillation reasonably controlled by rate management we will hold anticoagulants until after surgery We will continue to follow.   Yolonda Kida, MD 02/18/2022 1:46 PM

## 2022-02-18 NOTE — Assessment & Plan Note (Addendum)
Patient is evaluated by speech therapy,  does not have a high risk for aspiration. We will continue dysphagia 3 diet after discharge.

## 2022-02-18 NOTE — Progress Notes (Signed)
[facility]  ROUNDING NOTE   Subjective:   Patient seen resting quietly Breakfast tray at bedside Denies nausea and vomiting Denies shortness of breath  Sodium 128 Urine output recorded of 1.4 L in 24 hours  Objective:  Vital signs in last 24 hours:  Temp:  [97.5 F (36.4 C)-99.6 F (37.6 C)] 98.7 F (37.1 C) (02/24 0803) Pulse Rate:  [56-91] 91 (02/24 0803) Resp:  [17-20] 20 (02/24 0803) BP: (116-183)/(56-86) 142/62 (02/24 0803) SpO2:  [92 %-100 %] 100 % (02/24 0803)  Weight change:  Filed Weights   02/14/22 1932 02/14/22 2200  Weight: 95.3 kg 98.2 kg    Intake/Output: I/O last 3 completed shifts: In: 0  Out: 1850 [Urine:1850]   Intake/Output this shift:  No intake/output data recorded.  Physical Exam: General: NAD  Head: Normocephalic, atraumatic. Moist oral mucosal membranes  Eyes: Anicteric  Lungs:  Clear to auscultation, normal effort  Heart: Regular rate and rhythm  Abdomen:  Soft, nontender  Extremities: No peripheral edema.  Neurologic: Nonfocal, moving all four extremities  Skin: No lesions  Access: None    Basic Metabolic Panel: Recent Labs  Lab 02/14/22 1940 02/16/22 0403 02/17/22 0308 02/17/22 0841 02/17/22 0953 02/17/22 1813 02/18/22 0227  NA 128* 126* 122* 125* 127* 128* 128*  K 3.5 4.7 3.9  --  3.9  --   --   CL 93* 90* 88*  --  90*  --   --   CO2 27 29 28   --  28  --   --   GLUCOSE 131* 110* 106*  --  98  --   --   BUN 23 19 20   --  18  --   --   CREATININE 1.35* 1.00 1.08  --  1.02  --   --   CALCIUM 8.9 8.9 8.2*  --  8.4*  --   --   MG  --   --  2.0  --   --   --   --     Liver Function Tests: Recent Labs  Lab 02/17/22 0953  AST 22  ALT 23  ALKPHOS 35*  BILITOT 1.2  PROT 6.0*  ALBUMIN 3.4*   No results for input(s): LIPASE, AMYLASE in the last 168 hours. No results for input(s): AMMONIA in the last 168 hours.  CBC: Recent Labs  Lab 02/14/22 1940 02/16/22 0403 02/17/22 0308  WBC 3.7* 6.5 6.9   NEUTROABS 2.1  --  5.1  HGB 12.3* 11.9* 12.1*  HCT 35.8* 34.6* 34.3*  MCV 97.5 96.9 95.5  PLT 199 173 168    Cardiac Enzymes: No results for input(s): CKTOTAL, CKMB, CKMBINDEX, TROPONINI in the last 168 hours.  BNP: Invalid input(s): POCBNP  CBG: No results for input(s): GLUCAP in the last 168 hours.  Microbiology: Results for orders placed or performed during the hospital encounter of 02/14/22  Resp Panel by RT-PCR (Flu A&B, Covid) Nasopharyngeal Swab     Status: None   Collection Time: 02/14/22  7:40 PM   Specimen: Nasopharyngeal Swab; Nasopharyngeal(NP) swabs in vial transport medium  Result Value Ref Range Status   SARS Coronavirus 2 by RT PCR NEGATIVE NEGATIVE Final    Comment: (NOTE) SARS-CoV-2 target nucleic acids are NOT DETECTED.  The SARS-CoV-2 RNA is generally detectable in upper respiratory specimens during the acute phase of infection. The lowest concentration of SARS-CoV-2 viral copies this assay can detect is 138 copies/mL. A negative result does not preclude SARS-Cov-2 infection and should not  be used as the sole basis for treatment or other patient management decisions. A negative result may occur with  improper specimen collection/handling, submission of specimen other than nasopharyngeal swab, presence of viral mutation(s) within the areas targeted by this assay, and inadequate number of viral copies(<138 copies/mL). A negative result must be combined with clinical observations, patient history, and epidemiological information. The expected result is Negative.  Fact Sheet for Patients:  EntrepreneurPulse.com.au  Fact Sheet for Healthcare Providers:  IncredibleEmployment.be  This test is no t yet approved or cleared by the [place] FDA and  has been authorized for detection and/or diagnosis of SARS-CoV-2 by FDA under an Emergency Use Authorization (EUA). This EUA will remain  in effect (meaning this test can  be used) for the duration of the COVID-19 declaration under Section 564(b)(1) of the Act, 21 U.S.C.section 360bbb-3(b)(1), unless the authorization is terminated  or revoked sooner.       Influenza A by PCR NEGATIVE NEGATIVE Final   Influenza B by PCR NEGATIVE NEGATIVE Final    Comment: (NOTE) The Xpert Xpress SARS-CoV-2/FLU/RSV plus assay is intended as an aid in the diagnosis of influenza from Nasopharyngeal swab specimens and should not be used as a sole basis for treatment. Nasal washings and aspirates are unacceptable for Xpert Xpress SARS-CoV-2/FLU/RSV testing.  Fact Sheet for Patients: EntrepreneurPulse.com.au  Fact Sheet for Healthcare Providers: IncredibleEmployment.be  This test is not yet approved or cleared by the [place] FDA and has been authorized for detection and/or diagnosis of SARS-CoV-2 by FDA under an Emergency Use Authorization (EUA). This EUA will remain in effect (meaning this test can be used) for the duration of the COVID-19 declaration under Section 564(b)(1) of the Act, 21 U.S.C. section 360bbb-3(b)(1), unless the authorization is terminated or revoked.  Performed at [facility], [address] [place], [place] [address]   Surgical PCR screen     Status: Abnormal   Collection Time: 02/14/22 10:51 PM   Specimen: Nasal Mucosa; Nasal Swab  Result Value Ref Range Status   MRSA, PCR POSITIVE (A) NEGATIVE Final    Comment: RESULT CALLED TO, READ BACK BY AND VERIFIED WITH: Luna Fuse [email or web address]  02/15/22 RH    Staphylococcus aureus POSITIVE (A) NEGATIVE Final    Comment: (NOTE) The Xpert SA Assay (FDA approved for NASAL specimens in patients 53 years of age and older), is one component of a comprehensive surveillance program. It is not intended to diagnose infection nor to guide or monitor treatment. Performed at [facility], [address] [place], [place] [address]     Coagulation  Studies: Recent Labs    02/15/22 1709 02/16/22 0403 02/16/22 1820 02/17/22 0308  LABPROT 27.0* 23.2* 17.1* 16.0*  INR 2.5* 2.1* 1.4* 1.3*    Urinalysis: No results for input(s): COLORURINE, LABSPEC, PHURINE, GLUCOSEU, HGBUR, BILIRUBINUR, KETONESUR, PROTEINUR, UROBILINOGEN, NITRITE, LEUKOCYTESUR in the last 72 hours.  Invalid input(s): APPERANCEUR    Imaging: CT CHEST W CONTRAST  Result Date: 02/17/2022 CLINICAL DATA:  Hyponatremia.  Evaluate for lung mass. EXAM: CT CHEST WITH CONTRAST TECHNIQUE: Multidetector CT imaging of the chest was performed during intravenous contrast administration. RADIATION DOSE REDUCTION: This exam was performed according to the departmental dose-optimization program which includes automated exposure control, adjustment of the mA and/or kV according to patient size and/or use of iterative reconstruction technique. CONTRAST:  22mL OMNIPAQUE IOHEXOL 300 MG/ML  SOLN COMPARISON:  Chest radiograph 02/14/2022 and chest CT 05/17/2016 FINDINGS: Cardiovascular: Enlargement of the aortic root at the sinuses of  Valsalva measuring up to 4.6 cm. Ascending thoracic aorta measures 4.5 cm and measured 4.4 cm in 2017. Post CABG changes. Left circumflex bypass graft is stented. Heart is enlarged, particularly the right atrium. No significant pericardial effusion. Main pulmonary artery measures 3.7 cm. No large filling defects in the main or central pulmonary arteries. Typical three-vessel arch anatomy. The great vessels are patent. Mid descending thoracic aorta measures 2.6 cm with atherosclerotic calcifications. At least mild stenosis near the origin of the celiac trunk. Mediastinum/Nodes: Small hiatal hernia. No mediastinal, hilar or axillary lymph node enlargement. Lungs/Pleura: Mild scarring at the lung apices. New focal pleural thickening or nodularity along the posterior right upper lobe on sequence 3, image 31. This area measures up to 8 mm. Patchy pleural-based densities in the  posterior right lower lobe are most compatible with atelectasis or scarring. Elongated nodular structure in the posterior left lung on sequence 2 image 55 measures roughly 5 mm and this appears to be new. Stable 4 mm nodule in the left upper lobe on sequence 3, image 35. Slightly increased posterior pleural thickening bilaterally. Question trace left pleural fluid. Upper Abdomen: Large fluid attenuating structure near the left hepatic dome is suggestive for a large cyst that measures up to 5.8 cm and previously measured 3.6 cm. Additional small hepatic low-density structures are suggestive for additional cysts. 3 mm stone in the right kidney upper pole. Musculoskeletal: Compression fracture along the superior endplate of E17 is new since the prior imaging. This fracture is age indeterminate. The other thoracic vertebral body heights are maintained. IMPRESSION: 1. Increased pleural thickening since 2017. New focal pleural nodularity in the posterior right upper chest measures 8 mm and indeterminate. This could represent scarring but recommend follow-up to ensure stability. Additional small nodular structures as described. Non-contrast chest CT at 3-6 months is recommended. If the nodules are stable at time of repeat CT, then future CT at 18-24 months (from today's scan) is considered optional for low-risk patients, but is recommended for high-risk patients. This recommendation follows the consensus statement: Guidelines for Management of Incidental Pulmonary Nodules Detected on CT Images: From the Fleischner Society 2017; Radiology 2017; 284:228-243. 2. 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 Aortic root measures 4.6 cm. Ascending thoracic aortic aneurysm. Recommend semi-annual imaging followup by CTA or MRA and referral to cardiothoracic surgery if not already obtained. This recommendation follows 2010 ACCF/AHA/AATS/ACR/ASA/SCA/SCAI/SIR/STS/SVM Guidelines for  the Diagnosis and Management of Patients With Thoracic Aortic Disease. Circulation. 2010; 121: E081-K481. Aortic aneurysm NOS (ICD10-I71.9) 3. Post CABG changes with cardiomegaly. 4. Hepatic cysts. 5. Small right renal calculus. 6. Age-indeterminate T12 vertebral body compression fracture. Electronically Signed   By: Markus Daft M.D.   On: 02/17/2022 10:49     Medications:     ceFAZolin (ANCEF) IV      Chlorhexidine Gluconate Cloth  6 each Topical Daily   docusate sodium  100 mg Oral q AM   dronedarone  400 mg Oral BID WC   enoxaparin (LOVENOX) injection  40 mg Subcutaneous Daily   ezetimibe  10 mg Oral QHS   feeding supplement  237 mL Oral BID BM   hydrALAZINE  10 mg Oral Q8H   irbesartan  300 mg Oral Daily   metoprolol tartrate  25 mg Oral BID   montelukast  10 mg Oral QHS   multivitamin with minerals  1 tablet Oral Daily   mupirocin ointment  1 application Nasal BID  QUEtiapine  25 mg Oral QHS   simvastatin  10 mg Oral QHS   tamsulosin  0.4 mg Oral QHS   benzonatate, haloperidol lactate, HYDROcodone-acetaminophen, HYDROmorphone (DILAUDID) injection, nitroGLYCERIN, ondansetron (ZOFRAN) IV  Assessment/ Plan:  Robert Rivas is a 81 y.o.  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  IV hydration given overnight with little response.  Sodium 128 on admission.    Sodium level decreased to 122 yesterday morning, prescribed tolvaptan 15 mg with improvement to 128.  Will prescribe additional dose of tolvaptan 15 mg with desired goal of sodium greater than 130.  We will continue to monitor.  Once goal reach, we recommend a combination  consideration of sodium tablets, Lasix, or fluid restriction to maintain level.     LOS: 4 [place] 2/24/20239:54 AM

## 2022-02-18 NOTE — Assessment & Plan Note (Addendum)
Patient had a severe agitation and confusion the second day after admission.  Received multiple Haldol doses.  He also received a low-dose of Seroquel 25 mg every evening.  Condition has improved.  Continue Seroquel for 1 week and discontinue afterwards.

## 2022-02-18 NOTE — Assessment & Plan Note (Addendum)
Continue beta-blocker, warfarin resumed. Patient will receive 10 mg of warfarin today, then resume home dose.

## 2022-02-18 NOTE — Progress Notes (Signed)
Order obtained for restraints on 02/17/22 but were never initiated.

## 2022-02-18 NOTE — Progress Notes (Signed)
°   02/18/22 0900  Clinical Encounter Type  Visited With Patient  Visit Type Initial  Referral From Nurse  Consult/Referral To Chaplain   Chaplain followed up from patient rapid. Patient was sleeping comfortably. On call Chaplain will follow up.

## 2022-02-19 ENCOUNTER — Inpatient Hospital Stay: Payer: Medicare Other

## 2022-02-19 ENCOUNTER — Encounter: Payer: Self-pay | Admitting: Internal Medicine

## 2022-02-19 ENCOUNTER — Inpatient Hospital Stay: Payer: Medicare Other | Admitting: Certified Registered Nurse Anesthetist

## 2022-02-19 ENCOUNTER — Encounter
Admission: EM | Disposition: A | Discharge status: Skilled Nursing Facility | Payer: Self-pay | Source: Home / Self Care | Attending: Internal Medicine

## 2022-02-19 ENCOUNTER — Other Ambulatory Visit: Payer: Self-pay

## 2022-02-19 DIAGNOSIS — R338 Other retention of urine: Secondary | ICD-10-CM

## 2022-02-19 DIAGNOSIS — I10 Essential (primary) hypertension: Secondary | ICD-10-CM | POA: Diagnosis not present

## 2022-02-19 DIAGNOSIS — E871 Hypo-osmolality and hyponatremia: Secondary | ICD-10-CM | POA: Diagnosis not present

## 2022-02-19 DIAGNOSIS — S72002A Fracture of unspecified part of neck of left femur, initial encounter for closed fracture: Secondary | ICD-10-CM | POA: Diagnosis not present

## 2022-02-19 HISTORY — PX: HIP ARTHROPLASTY: SHX981

## 2022-02-19 LAB — BASIC METABOLIC PANEL
Anion gap: 10 (ref 5–15)
BUN: 47 mg/dL — ABNORMAL HIGH (ref 8–23)
CO2: 29 mmol/L (ref 22–32)
Calcium: 8.7 mg/dL — ABNORMAL LOW (ref 8.9–10.3)
Chloride: 94 mmol/L — ABNORMAL LOW (ref 98–111)
Creatinine, Ser: 1.84 mg/dL — ABNORMAL HIGH (ref 0.61–1.24)
GFR, Estimated: 36 mL/min — ABNORMAL LOW (ref >60)
Glucose, Bld: 110 mg/dL — ABNORMAL HIGH (ref 70–99)
Potassium: 3.7 mmol/L (ref 3.5–5.1)
Sodium: 133 mmol/L — ABNORMAL LOW (ref 135–145)

## 2022-02-19 SURGERY — HEMIARTHROPLASTY, HIP, DIRECT ANTERIOR APPROACH, FOR FRACTURE
Anesthesia: General | Site: Hip | Laterality: Left

## 2022-02-19 MED ORDER — FENTANYL CITRATE (PF) 100 MCG/2ML IJ SOLN
INTRAMUSCULAR | Status: AC
  Filled 2022-02-19: qty 2

## 2022-02-19 MED ORDER — ROCURONIUM BROMIDE 100 MG/10ML IV SOLN
INTRAVENOUS | Status: DC | PRN
  Administered 2022-02-19 (×2): 10 mg via INTRAVENOUS
  Administered 2022-02-19: 50 mg via INTRAVENOUS

## 2022-02-19 MED ORDER — DOCUSATE SODIUM 100 MG PO CAPS
100.0000 mg | ORAL_CAPSULE | Freq: Two times a day (BID) | ORAL | Status: DC
  Administered 2022-02-19 – 2022-02-21 (×5): 100 mg via ORAL
  Filled 2022-02-19 (×5): qty 1

## 2022-02-19 MED ORDER — FERROUS SULFATE 325 (65 FE) MG PO TABS
325.0000 mg | ORAL_TABLET | Freq: Three times a day (TID) | ORAL | Status: DC
  Administered 2022-02-19 – 2022-02-22 (×8): 325 mg via ORAL
  Filled 2022-02-19 (×8): qty 1

## 2022-02-19 MED ORDER — WARFARIN - PHARMACIST DOSING INPATIENT: Freq: Every day | Status: DC

## 2022-02-19 MED ORDER — ACETAMINOPHEN 500 MG PO TABS
1000.0000 mg | ORAL_TABLET | Freq: Four times a day (QID) | ORAL | Status: AC
  Administered 2022-02-19 – 2022-02-20 (×4): 1000 mg via ORAL
  Filled 2022-02-19 (×4): qty 2

## 2022-02-19 MED ORDER — ONDANSETRON HCL 4 MG/2ML IJ SOLN
INTRAMUSCULAR | Status: AC
  Filled 2022-02-19: qty 2

## 2022-02-19 MED ORDER — BISACODYL 10 MG RE SUPP: 10.0000 mg | Freq: Every day | RECTAL | Status: DC | PRN

## 2022-02-19 MED ORDER — WARFARIN SODIUM 4 MG PO TABS: 4.0000 mg | ORAL_TABLET | Freq: Every day | ORAL | Status: DC

## 2022-02-19 MED ORDER — LIDOCAINE HCL (CARDIAC) PF 100 MG/5ML IV SOSY
PREFILLED_SYRINGE | INTRAVENOUS | Status: DC | PRN
  Administered 2022-02-19: 100 mg via INTRAVENOUS

## 2022-02-19 MED ORDER — WARFARIN SODIUM 5 MG PO TABS
5.0000 mg | ORAL_TABLET | Freq: Once | ORAL | Status: AC
  Administered 2022-02-19: 5 mg via ORAL
  Filled 2022-02-19: qty 1

## 2022-02-19 MED ORDER — DEXAMETHASONE SODIUM PHOSPHATE 10 MG/ML IJ SOLN
INTRAMUSCULAR | Status: AC
  Filled 2022-02-19: qty 1

## 2022-02-19 MED ORDER — CEFAZOLIN SODIUM-DEXTROSE 2-4 GM/100ML-% IV SOLN
2.0000 g | Freq: Four times a day (QID) | INTRAVENOUS | Status: AC
  Administered 2022-02-19: 2 g via INTRAVENOUS
  Filled 2022-02-19 (×2): qty 100

## 2022-02-19 MED ORDER — METHOCARBAMOL 1000 MG/10ML IJ SOLN
500.0000 mg | Freq: Four times a day (QID) | INTRAVENOUS | Status: DC | PRN
  Filled 2022-02-19: qty 5

## 2022-02-19 MED ORDER — EPHEDRINE 5 MG/ML INJ
INTRAVENOUS | Status: AC
  Filled 2022-02-19: qty 5

## 2022-02-19 MED ORDER — DEXAMETHASONE SODIUM PHOSPHATE 10 MG/ML IJ SOLN
INTRAMUSCULAR | Status: DC | PRN
  Administered 2022-02-19: 10 mg via INTRAVENOUS

## 2022-02-19 MED ORDER — EPHEDRINE SULFATE (PRESSORS) 50 MG/ML IJ SOLN
INTRAMUSCULAR | Status: DC | PRN
  Administered 2022-02-19: 15 mg via INTRAVENOUS
  Administered 2022-02-19: 10 mg via INTRAVENOUS

## 2022-02-19 MED ORDER — ONDANSETRON HCL 4 MG/2ML IJ SOLN: 4.0000 mg | Freq: Once | INTRAMUSCULAR | Status: DC | PRN

## 2022-02-19 MED ORDER — METHOCARBAMOL 500 MG PO TABS
500.0000 mg | ORAL_TABLET | Freq: Four times a day (QID) | ORAL | Status: DC | PRN
  Administered 2022-02-19 – 2022-02-22 (×2): 500 mg via ORAL
  Filled 2022-02-19 (×3): qty 1

## 2022-02-19 MED ORDER — ROCURONIUM BROMIDE 10 MG/ML (PF) SYRINGE
PREFILLED_SYRINGE | INTRAVENOUS | Status: AC
  Filled 2022-02-19: qty 10

## 2022-02-19 MED ORDER — LACTATED RINGERS IV SOLN
INTRAVENOUS | Status: DC | PRN
Start: 2022-02-19 — End: 2022-02-19

## 2022-02-19 MED ORDER — ACETAMINOPHEN 325 MG PO TABS: 325.0000 mg | ORAL_TABLET | Freq: Four times a day (QID) | ORAL | Status: DC | PRN

## 2022-02-19 MED ORDER — PHENOL 1.4 % MT LIQD
1.0000 | OROMUCOSAL | Status: DC | PRN
  Filled 2022-02-19: qty 177

## 2022-02-19 MED ORDER — MENTHOL 3 MG MT LOZG
1.0000 | LOZENGE | OROMUCOSAL | Status: DC | PRN
  Filled 2022-02-19: qty 9

## 2022-02-19 MED ORDER — LIDOCAINE HCL (PF) 2 % IJ SOLN
INTRAMUSCULAR | Status: AC
  Filled 2022-02-19: qty 5

## 2022-02-19 MED ORDER — ONDANSETRON HCL 4 MG/2ML IJ SOLN: 4.0000 mg | Freq: Four times a day (QID) | INTRAMUSCULAR | Status: DC | PRN

## 2022-02-19 MED ORDER — ACETAMINOPHEN 10 MG/ML IV SOLN
INTRAVENOUS | Status: AC
  Filled 2022-02-19: qty 100

## 2022-02-19 MED ORDER — ALUM & MAG HYDROXIDE-SIMETH 200-200-20 MG/5ML PO SUSP: 30.0000 mL | ORAL | Status: DC | PRN

## 2022-02-19 MED ORDER — CHLORHEXIDINE GLUCONATE CLOTH 2 % EX PADS
6.0000 | MEDICATED_PAD | Freq: Every day | CUTANEOUS | Status: DC
  Administered 2022-02-19 – 2022-02-22 (×4): 6 via TOPICAL

## 2022-02-19 MED ORDER — ACETAMINOPHEN 10 MG/ML IV SOLN
INTRAVENOUS | Status: DC | PRN
  Administered 2022-02-19: 1000 mg via INTRAVENOUS

## 2022-02-19 MED ORDER — FENTANYL CITRATE (PF) 100 MCG/2ML IJ SOLN: 25.0000 ug | INTRAMUSCULAR | Status: DC | PRN

## 2022-02-19 MED ORDER — FENTANYL CITRATE (PF) 100 MCG/2ML IJ SOLN
INTRAMUSCULAR | Status: DC | PRN
  Administered 2022-02-19: 50 ug via INTRAVENOUS

## 2022-02-19 MED ORDER — PHENYLEPHRINE HCL (PRESSORS) 10 MG/ML IV SOLN
INTRAVENOUS | Status: DC | PRN
  Administered 2022-02-19 (×4): 160 ug via INTRAVENOUS

## 2022-02-19 MED ORDER — TRAMADOL HCL 50 MG PO TABS
50.0000 mg | ORAL_TABLET | Freq: Four times a day (QID) | ORAL | Status: DC
  Administered 2022-02-19 – 2022-02-22 (×11): 50 mg via ORAL
  Filled 2022-02-19 (×11): qty 1

## 2022-02-19 MED ORDER — PROPOFOL 10 MG/ML IV BOLUS
INTRAVENOUS | Status: AC
  Filled 2022-02-19: qty 20

## 2022-02-19 MED ORDER — HYDROMORPHONE HCL 1 MG/ML IJ SOLN: 0.5000 mg | INTRAMUSCULAR | Status: DC | PRN

## 2022-02-19 MED ORDER — NEOMYCIN-POLYMYXIN B GU 40-200000 IR SOLN
Status: DC | PRN
  Administered 2022-02-19: 12 mL
  Administered 2022-02-19: 4 mL

## 2022-02-19 MED ORDER — PROPOFOL 10 MG/ML IV BOLUS
INTRAVENOUS | Status: DC | PRN
Start: 2022-02-19 — End: 2022-02-19
  Administered 2022-02-19: 150 mg via INTRAVENOUS

## 2022-02-19 MED ORDER — SENNA 8.6 MG PO TABS
1.0000 | ORAL_TABLET | Freq: Two times a day (BID) | ORAL | Status: DC
  Administered 2022-02-19 – 2022-02-21 (×5): 8.6 mg via ORAL
  Filled 2022-02-19 (×5): qty 1

## 2022-02-19 MED ORDER — ONDANSETRON HCL 4 MG PO TABS: 4.0000 mg | ORAL_TABLET | Freq: Four times a day (QID) | ORAL | Status: DC | PRN

## 2022-02-19 MED ORDER — OXYCODONE HCL 5 MG PO TABS
10.0000 mg | ORAL_TABLET | ORAL | Status: DC | PRN
  Administered 2022-02-21: 15 mg via ORAL
  Filled 2022-02-19: qty 3

## 2022-02-19 MED ORDER — SUGAMMADEX SODIUM 200 MG/2ML IV SOLN
INTRAVENOUS | Status: DC | PRN
  Administered 2022-02-19: 196.4 mg via INTRAVENOUS

## 2022-02-19 MED ORDER — OXYCODONE HCL 5 MG PO TABS
5.0000 mg | ORAL_TABLET | ORAL | Status: DC | PRN
  Administered 2022-02-20: 10 mg via ORAL
  Administered 2022-02-21 – 2022-02-22 (×2): 5 mg via ORAL
  Filled 2022-02-19 (×2): qty 2
  Filled 2022-02-19: qty 1

## 2022-02-19 MED ORDER — CEFAZOLIN SODIUM-DEXTROSE 2-4 GM/100ML-% IV SOLN
INTRAVENOUS | Status: AC
  Administered 2022-02-19: 2 g via INTRAVENOUS
  Filled 2022-02-19: qty 100

## 2022-02-19 MED ORDER — POLYETHYLENE GLYCOL 3350 17 G PO PACK: 17.0000 g | PACK | Freq: Every day | ORAL | Status: DC | PRN

## 2022-02-19 SURGICAL SUPPLY — 58 items
BLADE SAGITTAL WIDE XTHICK NO (BLADE) ×2 IMPLANT
BLADE SURG SZ10 CARB STEEL (BLADE) ×2 IMPLANT
BNDG COHESIVE 4X5 TAN ST LF (GAUZE/BANDAGES/DRESSINGS) ×2 IMPLANT
COVER BACK TABLE REUSABLE LG (DRAPES) ×2 IMPLANT
DRAPE 3/4 80X56 (DRAPES) ×4 IMPLANT
DRAPE INCISE IOBAN 66X60 STRL (DRAPES) ×2 IMPLANT
DRAPE ORTHO SPLIT 77X108 STRL (DRAPES) ×2
DRAPE SURG 17X11 SM STRL (DRAPES) ×2 IMPLANT
DRAPE SURG ORHT 6 SPLT 77X108 (DRAPES) ×2 IMPLANT
DRSG AQUACEL AG ADV 3.5X14 (GAUZE/BANDAGES/DRESSINGS) ×1 IMPLANT
DRSG OPSITE POSTOP 4X10 (GAUZE/BANDAGES/DRESSINGS) ×3 IMPLANT
DURAPREP 26ML APPLICATOR (WOUND CARE) ×8 IMPLANT
ELECT CAUTERY BLADE 6.4 (BLADE) ×2 IMPLANT
ELECT REM PT RETURN 9FT ADLT (ELECTROSURGICAL) ×2
ELECTRODE REM PT RTRN 9FT ADLT (ELECTROSURGICAL) ×1 IMPLANT
GAUZE 4X4 16PLY ~~LOC~~+RFID DBL (SPONGE) ×1 IMPLANT
GAUZE SPONGE 4X4 12PLY STRL (GAUZE/BANDAGES/DRESSINGS) ×2 IMPLANT
GAUZE XEROFORM 1X8 LF (GAUZE/BANDAGES/DRESSINGS) ×3 IMPLANT
GLOVE SURG ORTHO LTX SZ9 (GLOVE) ×4 IMPLANT
GLOVE SURG UNDER POLY LF SZ9 (GLOVE) ×2 IMPLANT
GOWN STRL REUS TWL 2XL XL LVL4 (GOWN DISPOSABLE) ×2 IMPLANT
GOWN STRL REUS W/ TWL LRG LVL3 (GOWN DISPOSABLE) ×1 IMPLANT
GOWN STRL REUS W/TWL LRG LVL3 (GOWN DISPOSABLE) ×1
HEAD MODULAR ENDO (Orthopedic Implant) ×1 IMPLANT
HEAD UNPLR 53XMDLR STRL HIP (Orthopedic Implant) IMPLANT
HEMOVAC 400ML (MISCELLANEOUS)
HOLSTER ELECTROSUGICAL PENCIL (MISCELLANEOUS) ×2 IMPLANT
IV NS IRRIG 3000ML ARTHROMATIC (IV SOLUTION) ×2 IMPLANT
KIT DRAIN HEMOVAC JP 7FR 400ML (MISCELLANEOUS) ×1 IMPLANT
KIT TURNOVER KIT A (KITS) ×2 IMPLANT
MANIFOLD NEPTUNE II (INSTRUMENTS) ×2 IMPLANT
NDL FILTER BLUNT 18X1 1/2 (NEEDLE) ×1 IMPLANT
NDL MAYO CATGUT SZ4 TPR NDL (NEEDLE) ×1 IMPLANT
NDL SAFETY ECLIPSE 18X1.5 (NEEDLE) ×1 IMPLANT
NEEDLE FILTER BLUNT 18X 1/2SAF (NEEDLE) ×1
NEEDLE FILTER BLUNT 18X1 1/2 (NEEDLE) ×1 IMPLANT
NEEDLE HYPO 18GX1.5 SHARP (NEEDLE) ×1
NEEDLE MAYO CATGUT SZ4 (NEEDLE) ×2 IMPLANT
NS IRRIG 1000ML POUR BTL (IV SOLUTION) ×2 IMPLANT
PACK HIP PROSTHESIS (MISCELLANEOUS) ×2 IMPLANT
PILLOW ABDUCTION FOAM SM (MISCELLANEOUS) ×2 IMPLANT
PULSAVAC PLUS IRRIG FAN TIP (DISPOSABLE) ×2
RETRIEVER SUT HEWSON (MISCELLANEOUS) ×2 IMPLANT
SLEEVE UNITRAX V40 (Orthopedic Implant) ×1 IMPLANT
SLEEVE UNITRAX V40 +12 (Orthopedic Implant) IMPLANT
SPONGE T-LAP 18X18 ~~LOC~~+RFID (SPONGE) ×7 IMPLANT
STAPLER SKIN PROX 35W (STAPLE) ×2 IMPLANT
STEM ACCOLADE SZ 6 (Hips) ×1 IMPLANT
SUT TICRON 2-0 30IN 311381 (SUTURE) ×8 IMPLANT
SUT VIC AB 0 CT1 36 (SUTURE) ×3 IMPLANT
SUT VIC AB 2-0 CT2 27 (SUTURE) ×5 IMPLANT
SYR 10ML LL (SYRINGE) ×2 IMPLANT
TAPE MICROFOAM 4IN (TAPE) ×1 IMPLANT
TAPE TRANSPORE STRL 2 31045 (GAUZE/BANDAGES/DRESSINGS) ×1 IMPLANT
TIP BRUSH PULSAVAC PLUS 24.33 (MISCELLANEOUS) ×2 IMPLANT
TIP FAN IRRIG PULSAVAC PLUS (DISPOSABLE) ×1 IMPLANT
TUBE SUCT KAM VAC (TUBING) IMPLANT
WATER STERILE IRR 500ML POUR (IV SOLUTION) ×2 IMPLANT

## 2022-02-19 NOTE — Progress Notes (Signed)
[facility]  ROUNDING NOTE   Subjective:   Patient seen resting quietly   Objective:  Vital signs in last 24 hours:  Temp:  [98.2 F (36.8 C)-98.7 F (37.1 C)] 98.7 F (37.1 C) (02/25 0429) Pulse Rate:  [58-91] 62 (02/25 0429) Resp:  [16-20] 18 (02/25 0429) BP: (98-142)/(52-62) 107/58 (02/25 0429) SpO2:  [94 %-100 %] 96 % (02/25 0429)  Weight change:  Filed Weights   02/14/22 1932 02/14/22 2200  Weight: 95.3 kg 98.2 kg    Intake/Output: I/O last 3 completed shifts: In: 35 [P.O.:720] Out: 1950 [UTMLY:6503]   Intake/Output this shift:  No intake/output data recorded.  Physical Exam: General: NAD  Head: Normocephalic, atraumatic. Moist oral mucosal membranes  Eyes: Anicteric  Lungs:  Clear to auscultation, normal effort  Heart: Regular rate and rhythm  Abdomen:  Soft, nontender  Extremities: No peripheral edema.  Neurologic: Nonfocal, moving all four extremities  Skin: No lesions  Access: None    Basic Metabolic Panel: Recent Labs  Lab 02/14/22 1940 02/16/22 0403 02/17/22 0308 02/17/22 0841 02/17/22 0953 02/17/22 1813 02/18/22 0227 02/19/22 0423  NA 128* 126* 122* 125* 127* 128* 128* 133*  K 3.5 4.7 3.9  --  3.9  --   --  3.7  CL 93* 90* 88*  --  90*  --   --  94*  CO2 27 29 28   --  28  --   --  29  GLUCOSE 131* 110* 106*  --  98  --   --  110*  BUN 23 19 20   --  18  --   --  47*  CREATININE 1.35* 1.00 1.08  --  1.02  --   --  1.84*  CALCIUM 8.9 8.9 8.2*  --  8.4*  --   --  8.7*  MG  --   --  2.0  --   --   --   --   --     Liver Function Tests: Recent Labs  Lab 02/17/22 0953  AST 22  ALT 23  ALKPHOS 35*  BILITOT 1.2  PROT 6.0*  ALBUMIN 3.4*   No results for input(s): LIPASE, AMYLASE in the last 168 hours. No results for input(s): AMMONIA in the last 168 hours.  CBC: Recent Labs  Lab 02/14/22 1940 02/16/22 0403 02/17/22 0308  WBC 3.7* 6.5 6.9  NEUTROABS 2.1  --  5.1  HGB 12.3* 11.9* 12.1*  HCT 35.8* 34.6* 34.3*   MCV 97.5 96.9 95.5  PLT 199 173 168    Cardiac Enzymes: No results for input(s): CKTOTAL, CKMB, CKMBINDEX, TROPONINI in the last 168 hours.  BNP: Invalid input(s): POCBNP  CBG: No results for input(s): GLUCAP in the last 168 hours.  Microbiology: Results for orders placed or performed during the hospital encounter of 02/14/22  Resp Panel by RT-PCR (Flu A&B, Covid) Nasopharyngeal Swab     Status: None   Collection Time: 02/14/22  7:40 PM   Specimen: Nasopharyngeal Swab; Nasopharyngeal(NP) swabs in vial transport medium  Result Value Ref Range Status   SARS Coronavirus 2 by RT PCR NEGATIVE NEGATIVE Final    Comment: (NOTE) SARS-CoV-2 target nucleic acids are NOT DETECTED.  The SARS-CoV-2 RNA is generally detectable in upper respiratory specimens during the acute phase of infection. The lowest concentration of SARS-CoV-2 viral copies this assay can detect is 138 copies/mL. A negative result does not preclude SARS-Cov-2 infection and should not be used as the sole basis for treatment or other  patient management decisions. A negative result may occur with  improper specimen collection/handling, submission of specimen other than nasopharyngeal swab, presence of viral mutation(s) within the areas targeted by this assay, and inadequate number of viral copies(<138 copies/mL). A negative result must be combined with clinical observations, patient history, and epidemiological information. The expected result is Negative.  Fact Sheet for Patients:  EntrepreneurPulse.com.au  Fact Sheet for Healthcare Providers:  IncredibleEmployment.be  This test is no t yet approved or cleared by the [place] FDA and  has been authorized for detection and/or diagnosis of SARS-CoV-2 by FDA under an Emergency Use Authorization (EUA). This EUA will remain  in effect (meaning this test can be used) for the duration of the COVID-19 declaration under Section  564(b)(1) of the Act, 21 U.S.C.section 360bbb-3(b)(1), unless the authorization is terminated  or revoked sooner.       Influenza A by PCR NEGATIVE NEGATIVE Final   Influenza B by PCR NEGATIVE NEGATIVE Final    Comment: (NOTE) The Xpert Xpress SARS-CoV-2/FLU/RSV plus assay is intended as an aid in the diagnosis of influenza from Nasopharyngeal swab specimens and should not be used as a sole basis for treatment. Nasal washings and aspirates are unacceptable for Xpert Xpress SARS-CoV-2/FLU/RSV testing.  Fact Sheet for Patients: EntrepreneurPulse.com.au  Fact Sheet for Healthcare Providers: IncredibleEmployment.be  This test is not yet approved or cleared by the [place] FDA and has been authorized for detection and/or diagnosis of SARS-CoV-2 by FDA under an Emergency Use Authorization (EUA). This EUA will remain in effect (meaning this test can be used) for the duration of the COVID-19 declaration under Section 564(b)(1) of the Act, 21 U.S.C. section 360bbb-3(b)(1), unless the authorization is terminated or revoked.  Performed at [facility], [address] [place], [place] [address]   Surgical PCR screen     Status: Abnormal   Collection Time: 02/14/22 10:51 PM   Specimen: Nasal Mucosa; Nasal Swab  Result Value Ref Range Status   MRSA, PCR POSITIVE (A) NEGATIVE Final    Comment: RESULT CALLED TO, READ BACK BY AND VERIFIED WITH: Luna Fuse [email or web address]  02/15/22 RH    Staphylococcus aureus POSITIVE (A) NEGATIVE Final    Comment: (NOTE) The Xpert SA Assay (FDA approved for NASAL specimens in patients 75 years of age and older), is one component of a comprehensive surveillance program. It is not intended to diagnose infection nor to guide or monitor treatment. Performed at [facility], [address] [place], [place] [address]     Coagulation Studies: Recent Labs    02/16/22 1820 02/17/22 0308 02/18/22 1346   LABPROT 17.1* 16.0* 15.6*  INR 1.4* 1.3* 1.2    Urinalysis: No results for input(s): COLORURINE, LABSPEC, PHURINE, GLUCOSEU, HGBUR, BILIRUBINUR, KETONESUR, PROTEINUR, UROBILINOGEN, NITRITE, LEUKOCYTESUR in the last 72 hours.  Invalid input(s): APPERANCEUR    Imaging: CT CHEST W CONTRAST  Result Date: 02/17/2022 CLINICAL DATA:  Hyponatremia.  Evaluate for lung mass. EXAM: CT CHEST WITH CONTRAST TECHNIQUE: Multidetector CT imaging of the chest was performed during intravenous contrast administration. RADIATION DOSE REDUCTION: This exam was performed according to the departmental dose-optimization program which includes automated exposure control, adjustment of the mA and/or kV according to patient size and/or use of iterative reconstruction technique. CONTRAST:  42mL OMNIPAQUE IOHEXOL 300 MG/ML  SOLN COMPARISON:  Chest radiograph 02/14/2022 and chest CT 05/17/2016 FINDINGS: Cardiovascular: Enlargement of the aortic root at the sinuses of Valsalva measuring up to 4.6 cm. Ascending thoracic aorta measures 4.5 cm and measured  4.4 cm in 2017. Post CABG changes. Left circumflex bypass graft is stented. Heart is enlarged, particularly the right atrium. No significant pericardial effusion. Main pulmonary artery measures 3.7 cm. No large filling defects in the main or central pulmonary arteries. Typical three-vessel arch anatomy. The great vessels are patent. Mid descending thoracic aorta measures 2.6 cm with atherosclerotic calcifications. At least mild stenosis near the origin of the celiac trunk. Mediastinum/Nodes: Small hiatal hernia. No mediastinal, hilar or axillary lymph node enlargement. Lungs/Pleura: Mild scarring at the lung apices. New focal pleural thickening or nodularity along the posterior right upper lobe on sequence 3, image 31. This area measures up to 8 mm. Patchy pleural-based densities in the posterior right lower lobe are most compatible with atelectasis or scarring. Elongated nodular  structure in the posterior left lung on sequence 2 image 55 measures roughly 5 mm and this appears to be new. Stable 4 mm nodule in the left upper lobe on sequence 3, image 35. Slightly increased posterior pleural thickening bilaterally. Question trace left pleural fluid. Upper Abdomen: Large fluid attenuating structure near the left hepatic dome is suggestive for a large cyst that measures up to 5.8 cm and previously measured 3.6 cm. Additional small hepatic low-density structures are suggestive for additional cysts. 3 mm stone in the right kidney upper pole. Musculoskeletal: Compression fracture along the superior endplate of M42 is new since the prior imaging. This fracture is age indeterminate. The other thoracic vertebral body heights are maintained. IMPRESSION: 1. Increased pleural thickening since 2017. New focal pleural nodularity in the posterior right upper chest measures 8 mm and indeterminate. This could represent scarring but recommend follow-up to ensure stability. Additional small nodular structures as described. Non-contrast chest CT at 3-6 months is recommended. If the nodules are stable at time of repeat CT, then future CT at 18-24 months (from today's scan) is considered optional for low-risk patients, but is recommended for high-risk patients. This recommendation follows the consensus statement: Guidelines for Management of Incidental Pulmonary Nodules Detected on CT Images: From the Fleischner Society 2017; Radiology 2017; 284:228-243. 2. 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 Aortic root measures 4.6 cm. Ascending thoracic aortic aneurysm. Recommend semi-annual imaging followup by CTA or MRA and referral to cardiothoracic surgery if not already obtained. This recommendation follows 2010 ACCF/AHA/AATS/ACR/ASA/SCA/SCAI/SIR/STS/SVM Guidelines for the Diagnosis and Management of Patients With Thoracic Aortic Disease. Circulation. 2010; 121:  A834-H962. Aortic aneurysm NOS (ICD10-I71.9) 3. Post CABG changes with cardiomegaly. 4. Hepatic cysts. 5. Small right renal calculus. 6. Age-indeterminate T12 vertebral body compression fracture. Electronically Signed   By: Markus Daft M.D.   On: 02/17/2022 10:49     Medications:     ceFAZolin (ANCEF) IV      Chlorhexidine Gluconate Cloth  6 each Topical Daily   docusate sodium  100 mg Oral q AM   dronedarone  400 mg Oral BID WC   ezetimibe  10 mg Oral QHS   feeding supplement  237 mL Oral BID BM   hydrALAZINE  10 mg Oral Q8H   irbesartan  300 mg Oral Daily   metoprolol tartrate  25 mg Oral BID   montelukast  10 mg Oral QHS   multivitamin with minerals  1 tablet Oral Daily   mupirocin ointment  1 application Nasal BID   QUEtiapine  25 mg Oral QHS   simvastatin  10 mg Oral QHS   tamsulosin  0.4 mg Oral QHS  benzonatate, haloperidol lactate, HYDROcodone-acetaminophen, HYDROmorphone (DILAUDID) injection, nitroGLYCERIN, ondansetron (ZOFRAN) IV  Assessment/ Plan:  Mr. TALIK CASIQUE is a 81 y.o.  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       Patient received IV hydration with little response.       Patient had sodium 128 on admission.      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  Patient was then prescribed additional dose of tolvaptan 15 mg with desired goal of sodium greater than 130.  Sodium has now improved to 133 No need for tolvaptan today  we recommend a combination  consideration of sodium tablets, Lasix, or fluid restriction to maintain level.  2. Anemia of chronic disease Patient hemoglobin is low but stable   3. Hypotension Patient blood pressure is on the lower side Will hold ARB    4. Acute kidney injury Patient has AKI on CKD Patient creatinine has increased from baseline of 1.3--1.6 to now 1.8 Patient has AKI secondary to ATN  Patient postvoid residual was more than 400 mL Possible contribution from obstructive issues  ATN most likely from hypotension and ARB's on board We will hold ARB Patient has CKD stage IIIb Patient has creatinine of 1.5 going back to 2014 Patient most likely has CKD secondary to hypertension Possible contribution from age associated decline  We will hold ARB I agree with the Foley catheter We will follow  5.Closed hip fracture Patient to undergo left hip arthroplasty today  6.Urinary retention Patient had large postvoid residual Patient is now status post catheter placement Primary team is following   LOS: 5 Carlea Badour s [facility] 2/25/20237:34 AM

## 2022-02-19 NOTE — Progress Notes (Addendum)
ANTICOAGULATION CONSULT NOTE  Pharmacy Consult for warfarin Indication: atrial fibrillation  Allergies  Allergen Reactions   Codeine Nausea And Vomiting and Nausea Only   Tizanidine Other (See Comments)    drowsiness    Patient Measurements: Height: 6\' 1"  (185.4 cm) Weight: 98.2 kg (216 lb 7.9 oz) IBW/kg (Calculated) : 79.9  Labs: Recent Labs    02/16/22 1820 02/17/22 0308 02/17/22 0953 02/18/22 1346 02/19/22 0423  HGB  --  12.1*  --   --   --   HCT  --  34.3*  --   --   --   PLT  --  168  --   --   --   LABPROT 17.1* 16.0*  --  15.6*  --   INR 1.4* 1.3*  --  1.2  --   CREATININE  --  1.08 1.02  --  1.84*    Estimated Creatinine Clearance: 38.2 mL/min (A) (by C-G formula based on SCr of 1.84 mg/dL (H)).   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   L upper back 5.0cm inf to base of neck, 5.0cm lat to spine   Hyperlipidemia    Hypertension    MI, old    Nocturia    Phlebitis    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   SCC IS R foreearm distal   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   SCC IS R forearm proximal   Squamous cell carcinoma of skin 03/26/2018   SCC IS R forearm   Squamous cell carcinoma of skin 01/01/2008   SCC IS R forearm   Wears dentures    partial    Medications:  PTA warfarin regimen: 3 mg daily - last dose was 02/13/22  Assessment: 81 year old man with past medical history of atrial fibrillation on Coumadin, arthritis, BPH, basal and squamous cell skin cancer, chronic kidney disease and hypertension.  Patient presents after a mechanical fall.  INR 2.5 on 02/15/2022 (INR therapeutic on PTA regimen). Received vitamin K to lower INR to allow for procedure. Pharmacy consulted for restarting warfarin.   DDI: cefazolin, dronedarone  Goal of Therapy:  INR 2-3 Monitor platelets by anticoagulation protocol: Yes   Plan:  INR subtherapeutic s/p receiving vitamin K and holding doses to allow for procedure. Will order warfarin 5 mg tonight. Hope to transition patient back to PTA regimen as he was therapeutic on this.  CBC per protocol  Loni Delbridge O Bashir Marchetti 02/19/2022,2:58 PM

## 2022-02-19 NOTE — Anesthesia Procedure Notes (Signed)
Procedure Name: Intubation Date/Time: 02/19/2022 10:10 AM Performed by: Doreen Salvage, CRNA Pre-anesthesia Checklist: Patient identified, Patient being monitored, Timeout performed, Emergency Drugs available and Suction available Patient Re-evaluated:Patient Re-evaluated prior to induction Oxygen Delivery Method: Circle system utilized Preoxygenation: Pre-oxygenation with 100% oxygen Induction Type: IV induction Ventilation: Mask ventilation without difficulty Laryngoscope Size: Mac and 3 Grade View: Grade I Tube type: Oral Tube size: 7.5 mm Number of attempts: 1 Airway Equipment and Method: Stylet Placement Confirmation: ETT inserted through vocal cords under direct vision, positive ETCO2 and breath sounds checked- equal and bilateral Secured at: 24 cm Tube secured with: Tape Dental Injury: Teeth and Oropharynx as per pre-operative assessment

## 2022-02-19 NOTE — Anesthesia Preprocedure Evaluation (Addendum)
Anesthesia Evaluation  Patient identified by MRN, date of birth, ID band Patient awake    Reviewed: Allergy & Precautions, NPO status , Patient's Chart, lab work & pertinent test results  History of Anesthesia Complications Negative for: history of anesthetic complications  Airway Mallampati: III  TM Distance: >3 FB Neck ROM: Full    Dental  (+) Partial Upper, Poor Dentition, Dental Advidsory Given   Pulmonary neg pulmonary ROS, neg sleep apnea, neg COPD,    breath sounds clear to auscultation- rhonchi (-) wheezing      Cardiovascular hypertension, Pt. on medications (-) angina+ CAD, + Past MI, + Cardiac Stents (most recent stent 2011) and + CABG (1990s)  + dysrhythmias Atrial Fibrillation + Valvular Problems/Murmurs MR  Rhythm:Irregular Rate:Tachycardia - Systolic murmurs and - Diastolic murmurs TTE 7989: NORMAL LEFT VENTRICULAR SYSTOLIC FUNCTION  NORMAL RIGHT VENTRICULAR SYSTOLIC FUNCTION  MODERATE MR + TR  NO VALVULAR STENOSIS  MODERATE BIATRIAL ENLARGEMENT  MILD RV ENLARGEMENT  MILD LVH  MILDLY DILATED AORTIC ROOT MEASURING UP TO 4.2 CM  MILDLY DILATED ASCENDING AORTA MEASURING UP TO 4.3 CM     Neuro/Psych neg Seizures negative neurological ROS  negative psych ROS   GI/Hepatic negative GI ROS, Neg liver ROS,   Endo/Other  negative endocrine ROSneg diabetes  Renal/GU Renal InsufficiencyRenal disease     Musculoskeletal  (+) Arthritis ,   Abdominal (+) - obese,   Peds  Hematology negative hematology ROS (+)   Anesthesia Other Findings Past Medical History: No date: A-fib ([place]) No date: Arthritis     Comment:  lower back 10/29/2020: Basal cell carcinoma     Comment:  Right post base of skull/neck No date: BPH (benign prostatic hyperplasia) No date: Cancer ([place])     Comment:  Skin Cancer No date: Chronic kidney disease     Comment:  Chronic Kidney Disease No date: Coronary artery disease No date:  Dysrhythmia     Comment:  Atrial Fibrillation; Supraventricular Tachycardia No date: Gout No date: Hearing aid worn     Comment:  has, does not wear 01/10/2008: Hx of melanoma in situ     Comment:  L upper back 5.0cm inf to base of neck, 5.0cm lat to               spine No date: Hyperlipidemia No date: Hypertension No date: MI, old No date: Nocturia No date: Phlebitis 04/11/2019: Squamous cell carcinoma of skin     Comment:  SCC IS R foreearm distal 04/11/2019: Squamous cell carcinoma of skin     Comment:  SCC IS R forearm proximal 03/26/2018: Squamous cell carcinoma of skin     Comment:  SCC IS R forearm 01/01/2008: Squamous cell carcinoma of skin     Comment:  SCC IS R forearm No date: Wears dentures     Comment:  partial   Reproductive/Obstetrics                             Anesthesia Physical  Anesthesia Plan  ASA: 3  Anesthesia Plan: General   Post-op Pain Management:    Induction: Intravenous  PONV Risk Score and Plan: 2 and Dexamethasone and Treatment may vary due to age or medical condition  Airway Management Planned: Oral ETT  Additional Equipment: None  Intra-op Plan:   Post-operative Plan: Extubation in OR  Informed Consent: I have reviewed the patients History and Physical, chart, labs and discussed the procedure including the risks,  benefits and alternatives for the proposed anesthesia with the patient or authorized representative who has indicated his/her understanding and acceptance.     Dental advisory given  Plan Discussed with: CRNA and Anesthesiologist  Anesthesia Plan Comments: (Discussed risks of anesthesia with patient, including sore throat, PONV, and rare risks such as cardiac or respiratory or neurological events. Patient understands.)       Anesthesia Quick Evaluation

## 2022-02-19 NOTE — Transfer of Care (Signed)
Immediate Anesthesia Transfer of Care Note  [name] [facility]  Procedure(s) Performed: ARTHROPLASTY BIPOLAR HIP (HEMIARTHROPLASTY) (Left: Hip)  Patient Location: PACU  Anesthesia Type:General  Level of Consciousness: drowsy  Airway & Oxygen Therapy: Patient Spontanous Breathing and Patient connected to face mask oxygen  Post-op Assessment: Report given to RN and Post -op Vital signs reviewed and stable  Post vital signs: Reviewed and stable  Last Vitals:  Vitals Value Taken Time  BP 136/63 02/19/22 1230  Temp    Pulse 68 02/19/22 1233  Resp 14 02/19/22 1233  SpO2 99 % 02/19/22 1233  Vitals shown include unvalidated device data.  Last Pain:  Vitals:   02/19/22 0847  TempSrc:   PainSc: 0-No pain      Patients Stated Pain Goal: 3 (56/86/16 8372)  Complications: No notable events documented.

## 2022-02-19 NOTE — Progress Notes (Signed)
°  Subjective:  POST OP CHECK s/p left hip hemiarthroplasty.   Patient reports left hip pain as mild.    Objective:   VITALS:   Vitals:   02/19/22 1330 02/19/22 1345 02/19/22 1447 02/19/22 1621  BP: (!) 118/57 (!) 116/59 121/70 (!) 127/59  Pulse: 69 72 77 72  Resp: 12 15  18   Temp:   98.5 F (36.9 C) 98.1 F (36.7 C)  TempSrc:      SpO2: 92% 94% 91% 100%  Weight:      Height:        PHYSICAL EXAM: Left lower extremity Neurovascular intact Sensation intact distally Intact pulses distally Dorsiflexion/Plantar flexion intact Incision: no drainage No cellulitis present Compartment soft  LABS  Results for orders placed or performed during the hospital encounter of 02/14/22 (from the past 24 hour(s))  Basic metabolic panel     Status: Abnormal   Collection Time: 02/19/22  4:23 AM  Result Value Ref Range   Sodium 133 (L) 135 - 145 mmol/L   Potassium 3.7 3.5 - 5.1 mmol/L   Chloride 94 (L) 98 - 111 mmol/L   CO2 29 22 - 32 mmol/L   Glucose, Bld 110 (H) 70 - 99 mg/dL   BUN 47 (H) 8 - 23 mg/dL   Creatinine, Ser 1.84 (H) 0.61 - 1.24 mg/dL   Calcium 8.7 (L) 8.9 - 10.3 mg/dL   GFR, Estimated 36 (L) >60 mL/min   Anion gap 10 5 - 15    DG HIP UNILAT WITH PELVIS 1V LEFT  Result Date: 02/19/2022 CLINICAL DATA:  Left hip surgery EXAM: DG HIP (WITH OR WITHOUT PELVIS) 1V*L* COMPARISON:  02/14/2022 FINDINGS: Interval postsurgical changes from left hip hemiarthroplasty. Arthroplasty components are in their expected alignment. No periprosthetic fracture or evidence of other complication. Expected postoperative changes within the overlying soft tissues. IMPRESSION: Negative. Electronically Signed   By: Davina Poke D.O.   On: 02/19/2022 13:29    Assessment/Plan: Day of Surgery   Principal Problem:   Closed left hip fracture [facility]) Active Problems:   Coronary artery disease   Stage 3a chronic kidney disease (CKD) (HCC)   Essential (primary) hypertension   Warfarin  anticoagulation   Paroxysmal atrial fibrillation (HCC)   BPH (benign prostatic hyperplasia)   Hyponatremia   Acute delirium   Dysphagia   Acute urinary retention  Patient stable postop. Postop x-rays demonstrate the hemiarthroplasty prosthesis is well-positioned.  There is no postop complication.  Leg lengths are equivalent. Patient will restart his Coumadin and will be managed by the [place]. Continue current pain management. Labs will be rechecked in the AM, Patient will begin physical therapy in the morning.  He is weightbearing as tolerated on the left lower extremity with posterior precautions. Patient's Foley catheter will be DC'd in the morning. Patient will complete 24 hours of postop antibiotics.    Thornton Park , MD 02/19/2022, 7:24 PM

## 2022-02-19 NOTE — Progress Notes (Signed)
[facility] Cardiology    SUBJECTIVE: Preop for hip surgery acceptable risk sodium is much improved denies any palpitations or tachycardia.   Vitals:   02/18/22 2030 02/19/22 0429 02/19/22 0429 02/19/22 0801  BP: (!) 105/52  (!) 107/58 (!) 123/59  Pulse: (!) 59  62 66  Resp: 16  18 15   Temp: 98.6 F (37 C) 98.7 F (37.1 C)  98.1 F (36.7 C)  TempSrc: Oral Oral    SpO2: 94%  96% 94%  Weight:      Height:         Intake/Output Summary (Last 24 hours) at 02/19/2022 1014 Last data filed at 02/19/2022 2585 Gross per 24 hour  Intake 720 ml  Output 750 ml  Net -30 ml      PHYSICAL EXAM  General: Well developed, well nourished, in no acute distress HEENT:  Normocephalic and atramatic Neck:  No JVD.  Lungs: Clear bilaterally to auscultation and percussion. Heart: Irregular irregular. Normal S1 and S2 without gallops or murmurs.  Abdomen: Bowel sounds are positive, abdomen soft and non-tender  Msk:  Back normal, normal gait. Normal strength and tone for age. Extremities: No clubbing, cyanosis or edema.   Neuro: Alert and oriented X 3. Psych:  Good affect, responds appropriately   LABS: Basic Metabolic Panel: Recent Labs    02/17/22 0308 02/17/22 0841 02/17/22 0953 02/17/22 1813 02/18/22 0227 02/19/22 0423  NA 122*   < > 127*   < > 128* 133*  K 3.9  --  3.9  --   --  3.7  CL 88*  --  90*  --   --  94*  CO2 28  --  28  --   --  29  GLUCOSE 106*  --  98  --   --  110*  BUN 20  --  18  --   --  47*  CREATININE 1.08  --  1.02  --   --  1.84*  CALCIUM 8.2*  --  8.4*  --   --  8.7*  MG 2.0  --   --   --   --   --    < > = values in this interval not displayed.   Liver Function Tests: Recent Labs    02/17/22 0953  AST 22  ALT 23  ALKPHOS 35*  BILITOT 1.2  PROT 6.0*  ALBUMIN 3.4*   No results for input(s): LIPASE, AMYLASE in the last 72 hours. CBC: Recent Labs    02/17/22 0308  WBC 6.9  NEUTROABS 5.1  HGB 12.1*  HCT 34.3*  MCV 95.5  PLT 168   Cardiac  Enzymes: No results for input(s): CKTOTAL, CKMB, CKMBINDEX, TROPONINI in the last 72 hours. BNP: Invalid input(s): POCBNP D-Dimer: No results for input(s): DDIMER in the last 72 hours. Hemoglobin A1C: No results for input(s): HGBA1C in the last 72 hours. Fasting Lipid Panel: No results for input(s): CHOL, HDL, LDLCALC, TRIG, CHOLHDL, LDLDIRECT in the last 72 hours. Thyroid Function Tests: Recent Labs    02/17/22 0841  TSH 10.727*   Anemia Panel: No results for input(s): VITAMINB12, FOLATE, FERRITIN, TIBC, IRON, RETICCTPCT in the last 72 hours.  CT CHEST W CONTRAST  Result Date: 02/17/2022 CLINICAL DATA:  Hyponatremia.  Evaluate for lung mass. EXAM: CT CHEST WITH CONTRAST TECHNIQUE: Multidetector CT imaging of the chest was performed during intravenous contrast administration. RADIATION DOSE REDUCTION: This exam was performed according to the departmental dose-optimization program which includes automated exposure control, adjustment  of the mA and/or kV according to patient size and/or use of iterative reconstruction technique. CONTRAST:  68mL OMNIPAQUE IOHEXOL 300 MG/ML  SOLN COMPARISON:  Chest radiograph 02/14/2022 and chest CT 05/17/2016 FINDINGS: Cardiovascular: Enlargement of the aortic root at the sinuses of Valsalva measuring up to 4.6 cm. Ascending thoracic aorta measures 4.5 cm and measured 4.4 cm in 2017. Post CABG changes. Left circumflex bypass graft is stented. Heart is enlarged, particularly the right atrium. No significant pericardial effusion. Main pulmonary artery measures 3.7 cm. No large filling defects in the main or central pulmonary arteries. Typical three-vessel arch anatomy. The great vessels are patent. Mid descending thoracic aorta measures 2.6 cm with atherosclerotic calcifications. At least mild stenosis near the origin of the celiac trunk. Mediastinum/Nodes: Small hiatal hernia. No mediastinal, hilar or axillary lymph node enlargement. Lungs/Pleura: Mild scarring at  the lung apices. New focal pleural thickening or nodularity along the posterior right upper lobe on sequence 3, image 31. This area measures up to 8 mm. Patchy pleural-based densities in the posterior right lower lobe are most compatible with atelectasis or scarring. Elongated nodular structure in the posterior left lung on sequence 2 image 55 measures roughly 5 mm and this appears to be new. Stable 4 mm nodule in the left upper lobe on sequence 3, image 35. Slightly increased posterior pleural thickening bilaterally. Question trace left pleural fluid. Upper Abdomen: Large fluid attenuating structure near the left hepatic dome is suggestive for a large cyst that measures up to 5.8 cm and previously measured 3.6 cm. Additional small hepatic low-density structures are suggestive for additional cysts. 3 mm stone in the right kidney upper pole. Musculoskeletal: Compression fracture along the superior endplate of R48 is new since the prior imaging. This fracture is age indeterminate. The other thoracic vertebral body heights are maintained. IMPRESSION: 1. Increased pleural thickening since 2017. New focal pleural nodularity in the posterior right upper chest measures 8 mm and indeterminate. This could represent scarring but recommend follow-up to ensure stability. Additional small nodular structures as described. Non-contrast chest CT at 3-6 months is recommended. If the nodules are stable at time of repeat CT, then future CT at 18-24 months (from today's scan) is considered optional for low-risk patients, but is recommended for high-risk patients. This recommendation follows the consensus statement: Guidelines for Management of Incidental Pulmonary Nodules Detected on CT Images: From the Fleischner Society 2017; Radiology 2017; 284:228-243. 2. 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 Aortic root measures 4.6 cm. Ascending thoracic aortic aneurysm. Recommend  semi-annual imaging followup by CTA or MRA and referral to cardiothoracic surgery if not already obtained. This recommendation follows 2010 ACCF/AHA/AATS/ACR/ASA/SCA/SCAI/SIR/STS/SVM Guidelines for the Diagnosis and Management of Patients With Thoracic Aortic Disease. Circulation. 2010; 121: N462-V035. Aortic aneurysm NOS (ICD10-I71.9) 3. Post CABG changes with cardiomegaly. 4. Hepatic cysts. 5. Small right renal calculus. 6. Age-indeterminate T12 vertebral body compression fracture. Electronically Signed   By: Markus Daft M.D.   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

## 2022-02-19 NOTE — Progress Notes (Addendum)
°  Progress Note   [name] TGG:[id number] DOB: 1941-11-23 DOA: 02/14/2022     5 DOS: the patient was seen and examined on 02/19/2022   Brief hospital course: 81 year old man with past medical history of atrial fibrillation on Coumadin, arthritis, BPH, basal and squamous cell skin cancer, chronic kidney disease and hypertension.  Patient presents after a mechanical fall.  INR 2.5 on 02/15/2022.  Small doses of vitamin K given.  INR will have to be under 1.5 in order to go for hemiarthroplasty. Patient developed acute hyponatremia, received tolvaptan.  Patient also developed significant delirium, was given Haldol and Seroquel.  Assessment and Plan: * Closed left hip fracture ([place])- (present on admission) Surgery today.  Dysphagia Spoke with speech therapist, patient does not have a high risk for aspiration.  Acute delirium Condition has improved.  Hyponatremia Received tolvaptan.  Sodium level gradually improved.  Followed by nephrology.  BPH (benign prostatic hyperplasia) Continue tamsulosin  Paroxysmal atrial fibrillation ([place])- (present on admission) Continue hold anticoagulation, restart post op.  Continue beta-blocker.   Warfarin anticoagulation Warfarin on hold due to scheduled surgery today. Continue prophylactic Lovenox.  Essential (primary) hypertension- (present on admission) Stable  Stage 3a chronic kidney disease (CKD) ([place])  stable.  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     Atrial fibrillation (HCC) Continue beta-blocker, continue hold anticoagulation.   Acute urinary retention secondary to benign prostate hypertrophy. Acute kidney failure secondary to urinary retention. Patient had a creatinine increased to 1.84, bladder scan showed residual of 454 mL.  Foley catheter was anchored.  Continue Flomax.     Subjective:  Patient doing well today, slept well last night.  No additional confusion or  agitation. No short of breath or cough.  Physical Exam: Vitals:   02/18/22 2030 02/19/22 0429 02/19/22 0429 02/19/22 0801  BP: (!) 105/52  (!) 107/58 (!) 123/59  Pulse: (!) 59  62 66  Resp: 16  18 15   Temp: 98.6 F (37 C) 98.7 F (37.1 C)  98.1 F (36.7 C)  TempSrc: Oral Oral    SpO2: 94%  96% 94%  Weight:      Height:       General exam: Appears calm and comfortable  Respiratory system: Clear to auscultation. Respiratory effort normal. Cardiovascular system: S1 & S2 heard, RRR. No JVD, murmurs, rubs, gallops or clicks. No pedal edema. Gastrointestinal system: Abdomen is nondistended, soft and nontender. No organomegaly or masses felt. Normal bowel sounds heard. Central nervous system: Alert and oriented. No focal neurological deficits. Extremities: Symmetric 5 x 5 power. Skin: No rashes, lesions or ulcers Psychiatry: Judgement and insight appear normal. Mood & affect appropriate.    Data Reviewed: Review of labs Family Communication: None  Disposition: Status is: Inpatient Remains inpatient appropriate because: Severity of disease and surgery.          Planned Discharge Destination: Skilled nursing facility     Time spent: 26 minutes  Author: Sharen Hones, MD 02/19/2022 11:15 AM  For on call review [email or web address].

## 2022-02-19 NOTE — Op Note (Signed)
02/19/2022  1:04 PM  PATIENT:  Robert Rivas   MRN: [id number]  PRE-OPERATIVE DIAGNOSIS: Displaced left femoral neck fracture  POST-OPERATIVE DIAGNOSIS: Same  PROCEDURE:  Procedure(s): Left hip hemiarthroplasty  PREOPERATIVE INDICATIONS:    [name] is an 81 y.o. male who was admitted with a diagnosis of a displaced left femoral fracture.  I  recommended surgical fixation with the hemiarthroplasty for this injury. I have explained the surgery and the postoperative course to the patient and their wife who have agreed with surgical management of this fracture.  Patient's surgery was delayed due to an elevated INR upon admission and hyponatremia.  Patient takes Coumadin at baseline for atrial fibrillation.  The risks benefits and alternatives were discussed with the patient and their family including but not limited to the risks of  infection requiring removal of the prosthesis, bleeding requiring blood transfusion, nerve injury especially to the sciatic nerve leading to foot drop or lower extremity numbness, periprosthetic fracture, dislocation leg length discrepancy, change in lower extremity rotation persistent hip pain, loosening or failure of the components and the need for revision surgery. Medical risks include but are not limited to DVT and pulmonary embolism, myocardial infarction, stroke, pneumonia, respiratory failure and death.  OPERATIVE REPORT     SURGEON:  Thornton Park, MD    ASSISTANT: Surgical tech    ANESTHESIA: General    COMPLICATIONS:  None.   SPECIMEN: Femoral head to pathology    COMPONENTS:  [employer]ker Accolade 2 femoral component size 6 with a 53 mm Accolade Unitrax head with a + 12 neck adjustment sleeve.    PROCEDURE IN DETAIL:   The patient was met in the holding area.  The left hip was identified and marked at the operative site after verbally confirming with the patient that this was the correct site of surgery.  The patient was then transported to  the OR  and  underwent general anesthesia due to recently being on Coumadin.  The patient was then placed in the lateral decubitus position with the operative side up and secured on the operating room table with a pegboard and all bony prominences were adequately padded. This included an axillary roll and additional padding around the nonoperative leg to prevent compression to the common peroneal nerve.    The operative lower extremity was prepped and draped in a sterile fashion.  A time out was performed prior to incision to verify patient's name, date of birth, medical record number, correct site of surgery correct procedure to be performed. The timeout was also used to verify the patient received antibiotics now appropriate instruments, implants and radiographic studies were available in the room. Once all in attendance were in agreement case began.    A posterolateral approach was utilized via sharp dissection  carried down to the subcutaneous tissue.  Bleeding vessels were coagulated using electrocautery.  The fascia lata was identified and incised along the length of the skin incision.  The gluteus maximus muscle was then split in line with its fibers. Self-retaining retractors were  inserted.  With the hip internally rotated, the short external rotators  were identified and removed from the posterior attachment from the greater trochanter. The piriformis was tagged for later repair. The capsule was identified and a T-shaped capsulotomy was performed. The capsule was tagged with #2 Tycron for later repair.  The femoral neck fracture was exposed, and the femoral head was removed using a corkscrew device. This was measured to be 53 mm in diameter.  The attention was then turned to proximal femur preparation.  An oscillating saw was used to perform a proximal femoral osteotomy 1 fingerbreadth above the lesser trochanter. The trial 53 mm femoral head was placed into the acetabulum and had an excellent suction  fit. The attention was then turned back to femoral preparation.    A femoral skid and Cobra retractor were placed under the femoral neck to allow for adequate visualization. A box osteotome was used to make the initial entry into the proximal femur. A single hand reamer was used to prepare the femoral canal. A T-shaped femoral canal sounder was then used to ensure no penetration femoral cortex had occurred during reaming. The proximal femur was then sequentially broached by hand. A size 6 femoral trial broach was found to have best medial to lateral canal fit. Once adequate mediolateral canal fill was achieved the trial femoral broach, neck, and head was assembled and the hip was reduced. It was found to have excellent stability, equivalent leg lengths with functional range of motion. The trial components were then removed.  I copiously irrigated the femoral canal and then impacted the real femoral prosthesis into place into the appropriate version, slightly anteverted to the normal anatomy, and I impacted the actual 53 mm Unitrax femoral component with a +12 neck adjustment sleeve into place. The hip was then reduced and taken through functional range of motion and found to have excellent stability. Leg lengths were restored. The hip joint was copiously irrigated.   A soft tissue repair of the capsule and external rotators was performed using #2 Tycron Excellent posterior capsular repair was achieved. The fascia lata was then closed with interrupted 0 Vicryl suture. The subcutaneous tissues were closed with 2-0 Vicryl and the skin approximated with staples.   The patient was then placed supine on the operative table. Leg lengths were checked clinically and found to be equivalent. An abduction pillow was placed between the lower extremities. The patient was then transferred to a hospital bed and brought to the PACU in stable condition. I was scrubbed and present the entire case and all sharp and instrument  counts were correct at the conclusion of the case. I spoke with the patient's wife by phone from the PACU to let her know the case was completed without complication patient was stable in recovery room.   Timoteo Gaul, MD Orthopedic Surgeon

## 2022-02-19 NOTE — Progress Notes (Signed)
The patient has been re-examined, and the chart reviewed, and there have been no interval changes to the documented history and physical.    The left hip has been marked.    The risks, benefits, and alternatives have been discussed at length, and the patient is willing to proceed.

## 2022-02-19 NOTE — Plan of Care (Signed)
°  Problem: Education: Goal: Knowledge of General Education information will improve Description: Including pain rating scale, medication(s)/side effects and non-pharmacologic comfort measures Outcome: Progressing   Problem: Health Behavior/Discharge Planning: Goal: Ability to manage health-related needs will improve Outcome: Progressing   Problem: Clinical Measurements: Goal: Will remain free from infection Outcome: Progressing   Problem: Clinical Measurements: Goal: Respiratory complications will improve Outcome: Progressing   Problem: Elimination: Goal: Will not experience complications related to bowel motility Outcome: Progressing   Problem: Pain Managment: Goal: General experience of comfort will improve Outcome: Progressing   Problem: Skin Integrity: Goal: Risk for impaired skin integrity will decrease Outcome: Progressing

## 2022-02-20 DIAGNOSIS — E871 Hypo-osmolality and hyponatremia: Secondary | ICD-10-CM | POA: Diagnosis not present

## 2022-02-20 DIAGNOSIS — I480 Paroxysmal atrial fibrillation: Secondary | ICD-10-CM | POA: Diagnosis not present

## 2022-02-20 DIAGNOSIS — I10 Essential (primary) hypertension: Secondary | ICD-10-CM | POA: Diagnosis not present

## 2022-02-20 DIAGNOSIS — S72002A Fracture of unspecified part of neck of left femur, initial encounter for closed fracture: Secondary | ICD-10-CM | POA: Diagnosis not present

## 2022-02-20 LAB — BASIC METABOLIC PANEL
Anion gap: 8 (ref 5–15)
BUN: 35 mg/dL — ABNORMAL HIGH (ref 8–23)
CO2: 27 mmol/L (ref 22–32)
Calcium: 8.3 mg/dL — ABNORMAL LOW (ref 8.9–10.3)
Chloride: 96 mmol/L — ABNORMAL LOW (ref 98–111)
Creatinine, Ser: 1.31 mg/dL — ABNORMAL HIGH (ref 0.61–1.24)
GFR, Estimated: 54 mL/min — ABNORMAL LOW (ref >60)
Glucose, Bld: 145 mg/dL — ABNORMAL HIGH (ref 70–99)
Potassium: 4.2 mmol/L (ref 3.5–5.1)
Sodium: 131 mmol/L — ABNORMAL LOW (ref 135–145)

## 2022-02-20 LAB — CBC WITH DIFFERENTIAL/PLATELET
Abs Immature Granulocytes: 0.03 10*3/uL (ref 0.00–0.07)
Basophils Absolute: 0 10*3/uL (ref 0.0–0.1)
Basophils Relative: 0 %
Eosinophils Absolute: 0 10*3/uL (ref 0.0–0.5)
Eosinophils Relative: 0 %
HCT: 28.4 % — ABNORMAL LOW (ref 39.0–52.0)
Hemoglobin: 9.9 g/dL — ABNORMAL LOW (ref 13.0–17.0)
Immature Granulocytes: 0 %
Lymphocytes Relative: 3 %
Lymphs Abs: 0.2 10*3/uL — ABNORMAL LOW (ref 0.7–4.0)
MCH: 33.3 pg (ref 26.0–34.0)
MCHC: 34.9 g/dL (ref 30.0–36.0)
MCV: 95.6 fL (ref 80.0–100.0)
Monocytes Absolute: 0.8 10*3/uL (ref 0.1–1.0)
Monocytes Relative: 11 %
Neutro Abs: 6.4 10*3/uL (ref 1.7–7.7)
Neutrophils Relative %: 86 %
Platelets: 176 10*3/uL (ref 150–400)
RBC: 2.97 MIL/uL — ABNORMAL LOW (ref 4.22–5.81)
RDW: 12.2 % (ref 11.5–15.5)
WBC: 7.5 10*3/uL (ref 4.0–10.5)
nRBC: 0 % (ref 0.0–0.2)

## 2022-02-20 LAB — PROTIME-INR
INR: 1.2 (ref 0.8–1.2)
Prothrombin Time: 15.2 seconds (ref 11.4–15.2)

## 2022-02-20 LAB — MAGNESIUM: Magnesium: 2.4 mg/dL (ref 1.7–2.4)

## 2022-02-20 MED ORDER — FUROSEMIDE 20 MG PO TABS
20.0000 mg | ORAL_TABLET | Freq: Two times a day (BID) | ORAL | Status: DC
  Administered 2022-02-20 – 2022-02-22 (×5): 20 mg via ORAL
  Filled 2022-02-20 (×5): qty 1

## 2022-02-20 MED ORDER — LACTULOSE 10 GM/15ML PO SOLN
20.0000 g | Freq: Once | ORAL | Status: AC
  Administered 2022-02-20: 20 g via ORAL
  Filled 2022-02-20: qty 30

## 2022-02-20 MED ORDER — WARFARIN SODIUM 5 MG PO TABS
5.0000 mg | ORAL_TABLET | Freq: Once | ORAL | Status: AC
  Administered 2022-02-20: 5 mg via ORAL
  Filled 2022-02-20: qty 1

## 2022-02-20 MED ORDER — SODIUM CHLORIDE 1 G PO TABS
2.0000 g | ORAL_TABLET | Freq: Two times a day (BID) | ORAL | Status: DC
  Administered 2022-02-20 – 2022-02-22 (×4): 2 g via ORAL
  Filled 2022-02-20 (×5): qty 2

## 2022-02-20 MED ORDER — TAMSULOSIN HCL 0.4 MG PO CAPS
0.4000 mg | ORAL_CAPSULE | Freq: Two times a day (BID) | ORAL | Status: DC
  Administered 2022-02-20 – 2022-02-22 (×5): 0.4 mg via ORAL
  Filled 2022-02-20 (×5): qty 1

## 2022-02-20 NOTE — Evaluation (Signed)
Physical Therapy Evaluation Patient Details Name: Robert Rivas MRN: [id number] DOB: February 04, 1941 Today's Date: 02/20/2022  History of Present Illness  Robert Rivas is a 81 y.o. male with medical history significant of PSVT s/p ablation 2018, frequent PVCs, ectopic atrioventricular node tachycardia, paroxysmal atrial fibrillation status post ablation 2022 on warfarin and amiodarone, followed by EP at [facility], paroxysmal atrial flutter, coronary artery disease s/p CABG 1992 with stents in 2012, , HTN, CKD 3, BPH, gout, CAD,  who presents to the ED with acute left hip pain sustained after he fell onto his left hip after tripping over a rug.  S/P THA.   Clinical Impression  Patient received in bed, he is HOH. Agrees to PT/OT assessment. Patient requires mod +2 assist for supine to sit. Min/mod +2 for sit to stand and min +2 for ambulation from bed to recliner with RW. Cues needed for hip precautions with good recall. Cues for sequencing and safety. He will continue to benefit from skilled PT while here to improve functional independence, strength and safety with mobility.         Recommendations for follow up therapy are one component of a multi-disciplinary discharge planning process, led by the attending physician.  Recommendations may be updated based on patient status, additional functional criteria and insurance authorization.  Follow Up Recommendations Skilled nursing-short term rehab (<3 hours/day)    Assistance Recommended at Discharge    Patient can return home with the following  A lot of help with bathing/dressing/bathroom;A lot of help with walking and/or transfers;Assist for transportation;Help with stairs or ramp for entrance;Assistance with cooking/housework    Equipment Recommendations Other (comment);None recommended by PT (has walker and cane at home)  Recommendations for Other Services       Functional Status Assessment Patient has had a recent decline in their functional status  and demonstrates the ability to make significant improvements in function in a reasonable and predictable amount of time.     Precautions / Restrictions Precautions Precautions: Posterior Hip Precaution Booklet Issued: Yes (comment) Restrictions Weight Bearing Restrictions: Yes LLE Weight Bearing: Weight bearing as tolerated      Mobility  Bed Mobility Overal bed mobility: Needs Assistance Bed Mobility: Supine to Sit     Supine to sit: +2 for physical assistance, HOB elevated, Mod assist          Transfers Overall transfer level: Needs assistance Equipment used: Rolling walker (2 wheels) Transfers: Sit to/from Stand Sit to Stand: Mod assist, +2 physical assistance                Ambulation/Gait Ambulation/Gait assistance: Min assist, +2 physical assistance, +2 safety/equipment Gait Distance (Feet): 5 Feet Assistive device: Rolling walker (2 wheels) Gait Pattern/deviations: Step-to pattern, Decreased step length - right, Decreased step length - left, Shuffle Gait velocity: decr     General Gait Details: patient requires cues for mobility, min +2 assist for safety wtih RW  Stairs            Wheelchair Mobility    Modified Rankin (Stroke Patients Only)       Balance Overall balance assessment: Needs assistance, History of Falls Sitting-balance support: Feet supported Sitting balance-Leahy Scale: Good     Standing balance support: During functional activity, Bilateral upper extremity supported, Reliant on assistive device for balance Standing balance-Leahy Scale: Poor Standing balance comment: needs min +2 assist for balance  Pertinent Vitals/Pain Pain Assessment Pain Assessment: 0-10 Pain Score: 5  Pain Descriptors / Indicators: Discomfort, Sore Pain Intervention(s): Monitored during session, Premedicated before session, Repositioned    Home Living Family/patient expects to be discharged to:: Skilled  nursing facility Living Arrangements: Spouse/significant other                      Prior Function Prior Level of Function : Independent/Modified Independent             Mobility Comments: no AD used at baseline ADLs Comments: Independent     Hand Dominance        Extremity/Trunk Assessment   Upper Extremity Assessment Upper Extremity Assessment: Defer to OT evaluation    Lower Extremity Assessment Lower Extremity Assessment: LLE deficits/detail LLE: Unable to fully assess due to pain LLE Coordination: decreased gross motor       Communication   Communication: HOH  Cognition Arousal/Alertness: Awake/alert Behavior During Therapy: WFL for tasks assessed/performed Overall Cognitive Status: Within Functional Limits for tasks assessed                                          General Comments      Exercises Total Joint Exercises Ankle Circles/Pumps: AROM, Left, 5 reps Quad Sets: AROM, Left, 5 reps Gluteal Sets: AROM, Left, 5 reps   Assessment/Plan    PT Assessment Patient needs continued PT services  PT Problem List Decreased strength;Decreased mobility;Decreased activity tolerance;Decreased balance;Pain;Decreased knowledge of use of DME;Decreased knowledge of precautions       PT Treatment Interventions DME instruction;Therapeutic exercise;Gait training;Balance training;Stair training;Functional mobility training;Therapeutic activities;Patient/family education;Neuromuscular re-education    PT Goals (Current goals can be found in the Care Plan section)  Acute Rehab PT Goals Patient Stated Goal: to go to rehab PT Goal Formulation: With patient Time For Goal Achievement: 03/06/22 Potential to Achieve Goals: Good    Frequency BID     Co-evaluation PT/OT/SLP Co-Evaluation/Treatment: Yes Reason for Co-Treatment: To address functional/ADL transfers;Necessary to address cognition/behavior during functional activity PT goals  addressed during session: Mobility/safety with mobility;Balance;Proper use of DME         AM-PAC PT "6 Clicks" Mobility  Outcome Measure Help needed turning from your back to your side while in a flat bed without using bedrails?: A Lot Help needed moving from lying on your back to sitting on the side of a flat bed without using bedrails?: A Lot Help needed moving to and from a bed to a chair (including a wheelchair)?: A Lot Help needed standing up from a chair using your arms (e.g., wheelchair or bedside chair)?: A Lot Help needed to walk in hospital room?: A Lot Help needed climbing 3-5 steps with a railing? : A Lot 6 Click Score: 12    End of Session Equipment Utilized During Treatment: Gait belt Activity Tolerance: Patient tolerated treatment well Patient left: in chair;with call bell/phone within reach;with chair alarm set Nurse Communication: Mobility status PT Visit Diagnosis: Unsteadiness on feet (R26.81);Other abnormalities of gait and mobility (R26.89);Muscle weakness (generalized) (M62.81);Repeated falls (R29.6);Difficulty in walking, not elsewhere classified (R26.2);Pain Pain - Right/Left: Left Pain - part of body: Hip    Time: 3220-2542 PT Time Calculation (min) (ACUTE ONLY): 25 min   Charges:   PT Evaluation $PT Eval Moderate Complexity: 1 Mod         Jaleiah Asay, PT, GCS 02/20/22,11:32  AM

## 2022-02-20 NOTE — Evaluation (Signed)
Occupational Therapy Evaluation Patient Details Name: Robert Rivas MRN: [id number] DOB: [birth year] Today's Date: 02/20/2022   History of Present Illness Robert Rivas is a 81 y.o. male with medical history significant of PSVT s/p ablation 2018, frequent PVCs, ectopic atrioventricular node tachycardia, paroxysmal atrial fibrillation status post ablation 2022 on warfarin and amiodarone, followed by EP at [facility], paroxysmal atrial flutter, coronary artery disease s/p CABG 1992 with stents in 2012, , HTN, CKD 3, BPH, gout, CAD,  who presents to the ED with acute left hip pain sustained after he fell onto his left hip after tripping over a rug.  S/P THA.   Clinical Impression   Pt seen for OT evaluation this date, POD#1 from above surgery. Prior to admission, pt was independent in all ADLs/IADLs and functional mobility (without AD, however has a SPC/RW). Pt was driving and going to the gym regularly. Pt was unable to verbalized posterior total hip precautions at start of session and was unable to verbalize how to implement during ADLs and functional mobility. Pt instructed in posterior total hip precautions and how to implement during ADLs/functional mobility. Pt currently requires MOD A+2 for bed mobility, SET-UP assist for seated grooming tasks, MAX A for seated LB dressing, MOD A+2 for sit>stand transfers, and MIN A +2 to walk ~ 48ft with RW due to pain and limited AROM of L hip. At end of session, pt able to recall 3/3 posterior total hip precautions. Pt would benefit from additional instruction in self care skills and techniques to help maintain precautions with or without assistive devices to support recall and carryover prior to discharge. Recommend SNF upon discharge.        Recommendations for follow up therapy are one component of a multi-disciplinary discharge planning process, led by the attending physician.  Recommendations may be updated based on patient status, additional functional criteria and  insurance authorization.   Follow Up Recommendations  Skilled nursing-short term rehab (<3 hours/day)    Assistance Recommended at Discharge Intermittent Supervision/Assistance  Patient can return home with the following Two people to help with walking and/or transfers;Two people to help with bathing/dressing/bathroom;Assistance with cooking/housework    Functional Status Assessment  Patient has had a recent decline in their functional status and demonstrates the ability to make significant improvements in function in a reasonable and predictable amount of time.  Equipment Recommendations  Other (comment) (defer to next venue of care)       Precautions / Restrictions Precautions Precautions: Posterior Hip Precaution Booklet Issued: Yes (comment) Restrictions Weight Bearing Restrictions: Yes LLE Weight Bearing: Weight bearing as tolerated      Mobility Bed Mobility Overal bed mobility: Needs Assistance Bed Mobility: Supine to Sit     Supine to sit: Mod assist, +2 for physical assistance, HOB elevated          Transfers Overall transfer level: Needs assistance Equipment used: Rolling walker (2 wheels) Transfers: Sit to/from Stand Sit to Stand: Mod assist, +2 physical assistance                  Balance Overall balance assessment: Needs assistance, History of Falls Sitting-balance support: No upper extremity supported, Feet supported Sitting balance-Leahy Scale: Good Sitting balance - Comments: Good sitting balance reaching within BOS at EOB   Standing balance support: During functional activity, Bilateral upper extremity supported, Reliant on assistive device for balance Standing balance-Leahy Scale: Poor Standing balance comment: Requires MIN +2 assist for steadying while taking forward steps  ADL either performed or assessed with clinical judgement   ADL Overall ADL's : Needs assistance/impaired                                        General ADL Comments: Requires SET-UP for seated grooming tasks and MAX A for seated LB ADLS     Vision Baseline Vision/History: 1 Wears glasses Ability to See in Adequate Light: 0 Adequate Patient Visual Report: No change from baseline              Pertinent Vitals/Pain Pain Assessment Pain Assessment: 0-10 Pain Score: 5  Pain Location: L hip Pain Descriptors / Indicators: Discomfort, Sore Pain Intervention(s): Monitored during session, Repositioned        Extremity/Trunk Assessment Upper Extremity Assessment Upper Extremity Assessment: Overall WFL for tasks assessed   Lower Extremity Assessment Lower Extremity Assessment: LLE deficits/detail LLE Deficits / Details: s/p hemiathroplasty LLE: Unable to fully assess due to pain LLE Coordination: decreased gross motor       Communication Communication Communication: HOH   Cognition Arousal/Alertness: Awake/alert Behavior During Therapy: WFL for tasks assessed/performed Overall Cognitive Status: Within Functional Limits for tasks assessed                                                  Home Living Family/patient expects to be discharged to:: Skilled nursing facility Living Arrangements: Spouse/significant other                                      Prior Functioning/Environment Prior Level of Function : Independent/Modified Independent             Mobility Comments: Independent without AD for functional mobility. Endorses 2 falls in the past 6 months ADLs Comments: Independent with ADLs. Drives, cooks, and goes to the gym regularly        OT Problem List: Decreased strength;Decreased range of motion;Decreased activity tolerance;Impaired balance (sitting and/or standing);Decreased knowledge of precautions;Pain      OT Treatment/Interventions: Self-care/ADL training;Therapeutic exercise;DME and/or AE instruction;Therapeutic  activities;Patient/family education;Balance training    OT Goals(Current goals can be found in the care plan section) Acute Rehab OT Goals Patient Stated Goal: to resume going to the gym OT Goal Formulation: With patient Time For Goal Achievement: 03/06/22 Potential to Achieve Goals: Good ADL Goals Pt Will Perform Grooming: with min guard assist;standing Pt Will Perform Lower Body Dressing: with min assist;with adaptive equipment;sit to/from stand Pt Will Transfer to Toilet: with min assist;stand pivot transfer;bedside commode  OT Frequency: Min 2X/week    Co-evaluation PT/OT/SLP Co-Evaluation/Treatment: Yes Reason for Co-Treatment: For patient/therapist safety;To address functional/ADL transfers PT goals addressed during session: Mobility/safety with mobility;Balance OT goals addressed during session: ADL's and self-care      AM-PAC OT "6 Clicks" Daily Activity     Outcome Measure Help from another person eating meals?: None Help from another person taking care of personal grooming?: A Little Help from another person toileting, which includes using toliet, bedpan, or urinal?: A Lot Help from another person bathing (including washing, rinsing, drying)?: A Lot Help from another person to put on and taking off regular upper body clothing?: A Little Help from another  person to put on and taking off regular lower body clothing?: A Lot 6 Click Score: 16   End of Session Equipment Utilized During Treatment: Gait belt;Rolling walker (2 wheels) Nurse Communication: Mobility status  Activity Tolerance: Patient tolerated treatment well Patient left: in chair;with call bell/phone within reach;with chair alarm set  OT Visit Diagnosis: Unsteadiness on feet (R26.81);Muscle weakness (generalized) (M62.81);Pain;History of falling (Z91.81) Pain - Right/Left: Left Pain - part of body: Hip                Time: 7505-1833 OT Time Calculation (min): 17 min Charges:  OT General Charges $OT Visit:  1 Visit OT Evaluation $OT Eval Moderate Complexity: [place], OTR/L [place]

## 2022-02-20 NOTE — Progress Notes (Signed)
°  Progress Note   [name] TWK:[id number] DOB: October 12, 1941 DOA: 02/14/2022     6 DOS: the patient was seen and examined on 02/20/2022   Brief hospital course: 81 year old man with past medical history of atrial fibrillation on Coumadin, arthritis, BPH, basal and squamous cell skin cancer, chronic kidney disease and hypertension.  Patient presents after a mechanical fall.  INR 2.5 on 02/15/2022.  Small doses of vitamin K given.  INR will have to be under 1.5 in order to go for hemiarthroplasty. Patient developed acute hyponatremia, received tolvaptan.  Patient also developed significant delirium, was given Haldol and Seroquel. Left hip hemiarthroplasty performed on 2/25  Assessment and Plan: * Closed left hip fracture (Robert Rivas)- (present on admission) Surgery yesterday, doing well.  Pending nursing home placement.  Acute urinary retention Continue Foley catheter, increased dose of Flomax.  Patient also had a secondary constipation, will give lactulose.  Planning to remove Foley cath tomorrow after large bowel movement.  Dysphagia Spoke with speech therapist, patient does not have a high risk for aspiration.  Acute delirium Condition has improved.  Continue Seroquel 25 mg every evening.  Hyponatremia Sodium level better, continue salt tablets started by nephrology, fluid restriction started.  BPH (benign prostatic hyperplasia) Continue tamsulosin  Paroxysmal atrial fibrillation (Robert Rivas)- (present on admission) Continue beta-blocker, warfarin resumed.  Warfarin anticoagulation Resume warfarin.  Essential (primary) hypertension- (present on admission) Stable  Stage 3a chronic kidney disease (CKD) (Robert Rivas)  stable.  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     Atrial fibrillation (Robert Rivas) Continue beta-blocker, continue hold anticoagulation.         Subjective:  Patient doing well today, no confusion.  Slept well last night. No  short of breath or cough. Evaluated by physical therapy, need nursing placement.  Physical Exam: Vitals:   02/19/22 1621 02/19/22 2043 02/20/22 0338 02/20/22 0748  BP: (!) 127/59 111/67 129/64 (!) 121/56  Pulse: 72 74 61 60  Resp: 18 20 20 18   Temp: 98.1 F (36.7 C) 99.5 F (37.5 C) 98.7 F (37.1 C) 98.6 F (37 C)  TempSrc:  Oral    SpO2: 100% 94% 94% 95%  Weight:      Height:       General exam: Appears calm and comfortable  Respiratory system: Clear to auscultation. Respiratory effort normal. Cardiovascular system: S1 & S2 heard, RRR. No JVD, murmurs, rubs, gallops or clicks. No pedal edema. Gastrointestinal system: Abdomen is nondistended, soft and nontender. No organomegaly or masses felt. Normal bowel sounds heard. Central nervous system: Alert and oriented. No focal neurological deficits. Extremities: Symmetric 5 x 5 power. Skin: No rashes, lesions or ulcers Psychiatry: Judgement and insight appear normal. Mood & affect appropriate.    Data Reviewed: Lab reviewed Family Communication:   Disposition: Status is: Inpatient Remains inpatient appropriate because: Peritoneal disease, pending placement.   Planned Discharge Destination: Skilled nursing facility     Time spent: 28 minutes  Author: Sharen Hones, MD 02/20/2022 12:15 PM  For on call review [email or web address].

## 2022-02-20 NOTE — Progress Notes (Signed)
[facility]  ROUNDING NOTE   Subjective:   Patient seen resting quietly   Objective:  Vital signs in last 24 hours:  Temp:  [97.4 F (36.3 C)-99.5 F (37.5 C)] 98.6 F (37 C) (02/26 0748) Pulse Rate:  [60-83] 60 (02/26 0748) Resp:  [12-20] 18 (02/26 0748) BP: (111-136)/(56-87) 121/56 (02/26 0748) SpO2:  [91 %-100 %] 95 % (02/26 0748)  Weight change:  Filed Weights   02/14/22 1932 02/14/22 2200  Weight: 95.3 kg 98.2 kg    Intake/Output: I/O last 3 completed shifts: In: 1405.4 [P.O.:220; I.V.:900; IV Piggyback:285.4] Out: 2800 [Urine:2650; Blood:150]   Intake/Output this shift:  No intake/output data recorded.  Physical Exam: General: NAD  Head: Normocephalic, atraumatic. Moist oral mucosal membranes  Eyes: Anicteric  Lungs:  Clear to auscultation, normal effort  Heart: Regular rate and rhythm  Abdomen:  Soft, nontender  Extremities: No peripheral edema.  Neurologic: Nonfocal, moving all four extremities  Skin: No lesions  Access: None    Basic Metabolic Panel: Recent Labs  Lab 02/16/22 0403 02/17/22 0308 02/17/22 0841 02/17/22 0953 02/17/22 1813 02/18/22 0227 02/19/22 0423 02/20/22 0432  NA 126* 122*   < > 127* 128* 128* 133* 131*  K 4.7 3.9  --  3.9  --   --  3.7 4.2  CL 90* 88*  --  90*  --   --  94* 96*  CO2 29 28  --  28  --   --  29 27  GLUCOSE 110* 106*  --  98  --   --  110* 145*  BUN 19 20  --  18  --   --  47* 35*  CREATININE 1.00 1.08  --  1.02  --   --  1.84* 1.31*  CALCIUM 8.9 8.2*  --  8.4*  --   --  8.7* 8.3*  MG  --  2.0  --   --   --   --   --  2.4   < > = values in this interval not displayed.    Liver Function Tests: Recent Labs  Lab 02/17/22 0953  AST 22  ALT 23  ALKPHOS 35*  BILITOT 1.2  PROT 6.0*  ALBUMIN 3.4*   No results for input(s): LIPASE, AMYLASE in the last 168 hours. No results for input(s): AMMONIA in the last 168 hours.  CBC: Recent Labs  Lab 02/14/22 1940 02/16/22 0403 02/17/22 0308  02/20/22 0432  WBC 3.7* 6.5 6.9 7.5  NEUTROABS 2.1  --  5.1 6.4  HGB 12.3* 11.9* 12.1* 9.9*  HCT 35.8* 34.6* 34.3* 28.4*  MCV 97.5 96.9 95.5 95.6  PLT 199 173 168 176    Cardiac Enzymes: No results for input(s): CKTOTAL, CKMB, CKMBINDEX, TROPONINI in the last 168 hours.  BNP: Invalid input(s): POCBNP  CBG: No results for input(s): GLUCAP in the last 168 hours.  Microbiology: Results for orders placed or performed during the hospital encounter of 02/14/22  Resp Panel by RT-PCR (Flu A&B, Covid) Nasopharyngeal Swab     Status: None   Collection Time: 02/14/22  7:40 PM   Specimen: Nasopharyngeal Swab; Nasopharyngeal(NP) swabs in vial transport medium  Result Value Ref Range Status   SARS Coronavirus 2 by RT PCR NEGATIVE NEGATIVE Final    Comment: (NOTE) SARS-CoV-2 target nucleic acids are NOT DETECTED.  The SARS-CoV-2 RNA is generally detectable in upper respiratory specimens during the acute phase of infection. The lowest concentration of SARS-CoV-2 viral copies this assay can detect  is 138 copies/mL. A negative result does not preclude SARS-Cov-2 infection and should not be used as the sole basis for treatment or other patient management decisions. A negative result may occur with  improper specimen collection/handling, submission of specimen other than nasopharyngeal swab, presence of viral mutation(s) within the areas targeted by this assay, and inadequate number of viral copies(<138 copies/mL). A negative result must be combined with clinical observations, patient history, and epidemiological information. The expected result is Negative.  Fact Sheet for Patients:  EntrepreneurPulse.com.au  Fact Sheet for Healthcare Providers:  IncredibleEmployment.be  This test is no t yet approved or cleared by the [place] FDA and  has been authorized for detection and/or diagnosis of SARS-CoV-2 by FDA under an Emergency Use Authorization  (EUA). This EUA will remain  in effect (meaning this test can be used) for the duration of the COVID-19 declaration under Section 564(b)(1) of the Act, 21 U.S.C.section 360bbb-3(b)(1), unless the authorization is terminated  or revoked sooner.       Influenza A by PCR NEGATIVE NEGATIVE Final   Influenza B by PCR NEGATIVE NEGATIVE Final    Comment: (NOTE) The Xpert Xpress SARS-CoV-2/FLU/RSV plus assay is intended as an aid in the diagnosis of influenza from Nasopharyngeal swab specimens and should not be used as a sole basis for treatment. Nasal washings and aspirates are unacceptable for Xpert Xpress SARS-CoV-2/FLU/RSV testing.  Fact Sheet for Patients: EntrepreneurPulse.com.au  Fact Sheet for Healthcare Providers: IncredibleEmployment.be  This test is not yet approved or cleared by the [place] FDA and has been authorized for detection and/or diagnosis of SARS-CoV-2 by FDA under an Emergency Use Authorization (EUA). This EUA will remain in effect (meaning this test can be used) for the duration of the COVID-19 declaration under Section 564(b)(1) of the Act, 21 U.S.C. section 360bbb-3(b)(1), unless the authorization is terminated or revoked.  Performed at [facility], [address], [place], [place] [address]   Surgical PCR screen     Status: Abnormal   Collection Time: 02/14/22 10:51 PM   Specimen: Nasal Mucosa; Nasal Swab  Result Value Ref Range Status   MRSA, PCR POSITIVE (A) NEGATIVE Final    Comment: RESULT CALLED TO, READ BACK BY AND VERIFIED WITH: Luna Fuse [email or web address]  02/15/22 RH    Staphylococcus aureus POSITIVE (A) NEGATIVE Final    Comment: (NOTE) The Xpert SA Assay (FDA approved for NASAL specimens in patients 47 years of age and older), is one component of a comprehensive surveillance program. It is not intended to diagnose infection nor to guide or monitor treatment. Performed at [facility], [address] [place], [place] [address]     Coagulation Studies: Recent Labs    02/18/22 1346 02/20/22 0432  LABPROT 15.6* 15.2  INR 1.2 1.2    Urinalysis: No results for input(s): COLORURINE, LABSPEC, PHURINE, GLUCOSEU, HGBUR, BILIRUBINUR, KETONESUR, PROTEINUR, UROBILINOGEN, NITRITE, LEUKOCYTESUR in the last 72 hours.  Invalid input(s): APPERANCEUR    Imaging: DG HIP UNILAT WITH PELVIS 1V LEFT  Result Date: 02/19/2022 CLINICAL DATA:  Left hip surgery EXAM: DG HIP (WITH OR WITHOUT PELVIS) 1V*L* COMPARISON:  02/14/2022 FINDINGS: Interval postsurgical changes from left hip hemiarthroplasty. Arthroplasty components are in their expected alignment. No periprosthetic fracture or evidence of other complication. Expected postoperative changes within the overlying soft tissues. IMPRESSION: Negative. Electronically Signed   By: Davina Poke D.O.   On: 02/19/2022 13:29     Medications:    methocarbamol (ROBAXIN) IV      acetaminophen  1,000 mg Oral Q6H   Chlorhexidine Gluconate Cloth  6 each Topical Daily   docusate sodium  100 mg Oral BID   dronedarone  400 mg Oral BID WC   ezetimibe  10 mg Oral QHS   feeding supplement  237 mL Oral BID BM   ferrous sulfate  325 mg Oral TID PC   hydrALAZINE  10 mg Oral Q8H   metoprolol tartrate  25 mg Oral BID   montelukast  10 mg Oral QHS   multivitamin with minerals  1 tablet Oral Daily   QUEtiapine  25 mg Oral QHS   senna  1 tablet Oral BID   simvastatin  10 mg Oral QHS   tamsulosin  0.4 mg Oral BID   traMADol  50 mg Oral Q6H   Warfarin - Pharmacist Dosing Inpatient   Does not apply q1600   acetaminophen, alum & mag hydroxide-simeth, benzonatate, bisacodyl, haloperidol lactate, HYDROmorphone (DILAUDID) injection, menthol-cetylpyridinium **OR** phenol, methocarbamol **OR** methocarbamol (ROBAXIN) IV, nitroGLYCERIN, ondansetron **OR** ondansetron (ZOFRAN) IV, oxyCODONE, oxyCODONE, polyethylene glycol  Assessment/ Plan:  Mr. BRYAN GOIN is a 81 y.o.  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       Patient received IV hydration with little response.       Patient had sodium 128 on admission.      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  Patient was then prescribed additional dose of tolvaptan 15 mg with desired goal of sodium greater than 130.  Sodium has now improved to 133 No need for tolvaptan today    Patient was hypotensive yesterday so we could not start patient on Lasix Patient blood pressure is now better Patient AKI is now better We will start patient on salt plus Lasix combination in an effort to help maintain the sodium levels   2. Anemia of chronic disease Patient hemoglobin is low but stable   3. Hypotension Patient blood pressure is on the lower side  Patient's blood pressure is now better Patient SBP is 120 mmHg    4. Acute kidney injury Patient has AKI on CKD Patient creatinine has increased from baseline of 1.3--1.6 to now 1.8 Patient has AKI secondary to ATN  Patient postvoid residual was more than 400 mL Possible contribution from obstructive issues  ATN most likely from hypotension and ARB's on board Patient has CKD stage IIIb Patient has creatinine of 1.5 going back to 2014 Patient most likely has CKD secondary to hypertension Possible contribution from age associated decline  Patient's ARB was held Patient AKI is now better Patient creatinine has come down from 1.8 to  now 1.3  5.Closed hip fracture Patient is s/p left hip arthroplasty. Ortho is following  6.Urinary retention Patient had large postvoid residual Patient is now status post catheter placement Primary team is following   LOS: 6 Anahla Bevis s Claretha Townshend 2/26/20238:37 AM

## 2022-02-20 NOTE — NC FL2 (Signed)
[facility] LEVEL OF CARE SCREENING TOOL     IDENTIFICATION  Patient Name: Robert Rivas Birthdate: 1941-09-22 Sex: male Admission Date (Current Location): 02/14/2022  [place] and [place] Number:  Engineering geologist and Address:  [facility], [address], [place], [place] [address]      Provider Number: [id number]  Attending Physician Name and Address:  Sharen Hones, MD  Relative Name and Phone Number:  Alee, Katen (Spouse)   [phone] ([place])    Current Level of Care: Hospital Recommended Level of Care: [place] Prior Approval Number:    Date Approved/Denied:   PASRR Number: [id number]  Discharge Plan: SNF    Current Diagnoses: Patient Active Problem List   Diagnosis Date Noted   Acute urinary retention 02/19/2022   Acute delirium 02/18/2022   Dysphagia 02/18/2022   Hyponatremia 02/15/2022   Closed left hip fracture ([place]) 02/14/2022   Atrial fibrillation with RVR ([place]) 04/12/2021   Bradycardia 07/08/2019   Venous insufficiency of both lower extremities 07/08/2019   Abnormal glucose 01/10/2019   Elevated PSA 01/10/2019   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   Palpitations 06/13/2018   Leg edema, right 09/20/2017   History of BPH 02/21/2017   BPH (benign prostatic hyperplasia) 04/25/2016   Nocturia 04/25/2016   Gout 03/25/2016   History of colon polyps 03/25/2016   Inflammation of a vein 03/25/2016   Percutaneous transluminal coronary angioplasty status 03/25/2016   Supraventricular tachycardia ([place]) 03/25/2016   Benign essential HTN 06/10/2015   Paroxysmal atrial fibrillation ([place]) 01/20/2015   Atrial fibrillation ([place]) 05/13/2014   Coronary artery disease 05/13/2014   Chest pain 05/13/2014   Stage 3a chronic kidney disease (CKD) ([place]) 05/13/2014   Essential (primary) hypertension 05/13/2014   History of cardiac catheterization 05/13/2014   HLD (hyperlipidemia) 05/13/2014   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   History of cardiovascular surgery 05/13/2014   History of anticoagulant therapy 04/28/2014   Warfarin anticoagulation 04/28/2014   Benign prostatic hyperplasia with urinary obstruction 11/05/2013    Orientation RESPIRATION BLADDER Height & Weight     Self, Time, Situation, Place  Normal Continent Weight: 216 lb 7.9 oz (98.2 kg) Height:  6\' 1"  (185.4 cm)  BEHAVIORAL SYMPTOMS/MOOD NEUROLOGICAL BOWEL NUTRITION STATUS      Continent    AMBULATORY STATUS COMMUNICATION OF NEEDS Skin   Extensive Assist Verbally Normal                       Personal Care Assistance Level of Assistance  Bathing, Feeding, Dressing, Total care Bathing Assistance: Limited assistance Feeding assistance: Independent Dressing Assistance: Maximum assistance Total Care Assistance: Maximum assistance   Functional Limitations Info  Sight, Hearing, Speech Sight Info: Adequate Hearing Info: Adequate Speech Info: Adequate    SPECIAL CARE FACTORS FREQUENCY  PT (By licensed PT), OT (By licensed OT)     PT Frequency: 5X per week OT Frequency: 5X per week            Contractures Contractures Info: Not present    Additional Factors Info                  Current Medications (02/20/2022):  This is the current hospital active medication list Current Facility-Administered Medications  Medication Dose Route Frequency Provider Last Rate Last Admin   acetaminophen (TYLENOL) tablet 325-650 mg  325-650 mg Oral Q6H PRN Thornton Park, MD       alum & mag hydroxide-simeth (MAALOX/MYLANTA) [phone]  MG/5ML suspension 30 mL  30 mL Oral Q4H PRN Thornton Park, MD       benzonatate (TESSALON) capsule 100 mg  100 mg Oral TID PRN Thornton Park, MD   100 mg at 02/19/22 2332   bisacodyl (DULCOLAX) suppository 10 mg  10 mg Rectal Daily PRN Thornton Park, MD       Chlorhexidine Gluconate Cloth 2 % PADS 6 each  6 each Topical Daily Thornton Park, MD   6 each  at 02/20/22 1021   docusate sodium (COLACE) capsule 100 mg  100 mg Oral BID Thornton Park, MD   100 mg at 02/20/22 1020   dronedarone (MULTAQ) tablet 400 mg  400 mg Oral BID WC Thornton Park, MD   400 mg at 02/20/22 1020   ezetimibe (ZETIA) tablet 10 mg  10 mg Oral QHS Thornton Park, MD   10 mg at 02/19/22 2119   feeding supplement (ENSURE ENLIVE / ENSURE PLUS) liquid 237 mL  237 mL Oral BID BM Thornton Park, MD   237 mL at 02/20/22 1021   ferrous sulfate tablet 325 mg  325 mg Oral TID PC Thornton Park, MD   325 mg at 02/20/22 1259   furosemide (LASIX) tablet 20 mg  20 mg Oral BID Bhutani, Manpreet S, MD   20 mg at 02/20/22 1020   haloperidol lactate (HALDOL) injection 2 mg  2 mg Intravenous Q4H PRN Thornton Park, MD   2 mg at 02/17/22 1714   hydrALAZINE (APRESOLINE) tablet 10 mg  10 mg Oral Q8H Thornton Park, MD   10 mg at 02/20/22 0540   HYDROmorphone (DILAUDID) injection 0.5-1 mg  0.5-1 mg Intravenous Q4H PRN Thornton Park, MD       menthol-cetylpyridinium (CEPACOL) lozenge 3 mg  1 lozenge Oral PRN Thornton Park, MD       Or   phenol (CHLORASEPTIC) mouth spray 1 spray  1 spray Mouth/Throat PRN Thornton Park, MD       methocarbamol (ROBAXIN) tablet 500 mg  500 mg Oral Q6H PRN Thornton Park, MD   500 mg at 02/19/22 2118   Or   methocarbamol (ROBAXIN) 500 mg in dextrose 5 % 50 mL IVPB  500 mg Intravenous Q6H PRN Thornton Park, MD       metoprolol tartrate (LOPRESSOR) tablet 25 mg  25 mg Oral BID Thornton Park, MD   25 mg at 02/20/22 1020   montelukast (SINGULAIR) tablet 10 mg  10 mg Oral QHS Thornton Park, MD   10 mg at 02/19/22 2119   multivitamin with minerals tablet 1 tablet  1 tablet Oral Daily Thornton Park, MD   1 tablet at 02/20/22 1021   nitroGLYCERIN (NITROSTAT) SL tablet 0.4 mg  0.4 mg Sublingual Q5 Min x 3 PRN Thornton Park, MD       ondansetron [facility]) tablet 4 mg  4 mg Oral Q6H PRN Thornton Park, MD       Or   ondansetron [facility])  injection 4 mg  4 mg Intravenous Q6H PRN Thornton Park, MD       oxyCODONE (Oxy IR/ROXICODONE) immediate release tablet 10-15 mg  10-15 mg Oral Q4H PRN Thornton Park, MD       oxyCODONE (Oxy IR/ROXICODONE) immediate release tablet 5-10 mg  5-10 mg Oral Q4H PRN Thornton Park, MD   10 mg at 02/20/22 0036   polyethylene glycol (MIRALAX / GLYCOLAX) packet 17 g  17 g Oral Daily PRN Thornton Park, MD       QUEtiapine (SEROQUEL)  tablet 25 mg  25 mg Oral QHS Thornton Park, MD   25 mg at 02/19/22 2119   senna (SENOKOT) tablet 8.6 mg  1 tablet Oral BID Thornton Park, MD   8.6 mg at 02/20/22 1020   simvastatin (ZOCOR) tablet 10 mg  10 mg Oral QHS Thornton Park, MD   10 mg at 02/19/22 2123   sodium chloride tablet 2 g  2 g Oral BID WC Bhutani, Manpreet S, MD       tamsulosin (FLOMAX) capsule 0.4 mg  0.4 mg Oral BID Sharen Hones, MD   0.4 mg at 02/20/22 1020   traMADol (ULTRAM) tablet 50 mg  50 mg Oral Q6H Thornton Park, MD   50 mg at 02/20/22 1259   warfarin (COUMADIN) tablet 5 mg  5 mg Oral ONCE-1600 Noralee Space, [facility]       Warfarin - Pharmacist Dosing Inpatient   Does not apply [id number] Sherilyn Banker, [facility]   Given at 02/19/22 1624     Discharge Medications: Please see discharge summary for a list of discharge medications.  Relevant Imaging Results:  Relevant Lab Results:   Additional Information SS# [id number]  Adelene Amas, LCSW

## 2022-02-20 NOTE — TOC Progression Note (Addendum)
Transition of Care [facility]) - Progression Note    Patient Details  Name: Robert Rivas MRN: [id number] Date of Birth: 02-13-1941  Transition of Care [facility]) CM/SW [place], LCSW Phone Number: 02/20/2022, 2:18 PM  Clinical Narrative:     PT/OT recommend SNF for rehab.  Sent FL2 for signature.  CSW updated Attending requesting signature, to begin bed search. CSW left voicemail w/ contact info for patient's spouse Abdul, Beirne (Spouse) [phone] for update.  Expected Discharge Plan: [place] Barriers to Discharge: Continued Medical Work up  Expected Discharge Plan and Services Expected Discharge Plan: [place] In-house Referral: Clinical Social Work   Post Acute Care Choice: [place] Living arrangements for the past 2 months: Apartment                                       Social Determinants of Health (SDOH) Interventions    Readmission Risk Interventions No flowsheet data found.

## 2022-02-20 NOTE — TOC Progression Note (Signed)
Transition of Care [facility]) - Progression Note    Patient Details  Name: Robert Rivas MRN: [id number] Date of Birth: July 16, 1941  Transition of Care [facility]) CM/SW [place], [place] Phone Number: [phone] 02/20/2022, 3:25 PM  Clinical Narrative:     CSW spoke with patient's spouse  Dushawn, Pusey (Spouse) [phone] and updated her on PT/OT recommendations.  Preferred SNF is [facility], but will consider other facilities [place] and only [place]. CSW explained to Ms. Battershell we could not guarantee placement in [place] and would extent our search to [place] and surrounding area as well.  Ms. Tindel stated she is unable to drive outside of [place] area. CSW explained if Ms. Sebesta refused placement offer, even if it is outside of [place], if there were not offers in [place] available, the patient's only other option is to d/c home with home health. Ms. Mccarley verbalized understanding and stated, "you do what you have to do." CSW will begin placement search once Fl2 is signed.  CSW has updated Attending with request.  Expected Discharge Plan: Skilled Nursing Facility Barriers to Discharge: Continued Medical Work up  Expected Discharge Plan and Services Expected Discharge Plan: [place] In-house Referral: Clinical Social Work   Post Acute Care Choice: [place] Living arrangements for the past 2 months: Apartment                                       Social Determinants of Health (SDOH) Interventions    Readmission Risk Interventions No flowsheet data found.

## 2022-02-20 NOTE — TOC Initial Note (Addendum)
Transition of Care [facility]) - Initial/Assessment Note    Patient Details  Name: Robert Rivas MRN: [id number] Date of Birth: 17-Jul-1941  Transition of Care [facility]) CM/SW Contact:    Adelene Amas, LCSW Phone Number: [phone] 02/20/2022, 8:57 AM  Clinical Narrative:                  Patient presents to [facility] after mechanical fall at home. Patient hit his head, but did not lose consciousness con and left hip.  Patient hx of atrial fibrillation and takes Coumadin. Patient dx with left femoral neck hip fracture, and has surgery on 02/19/2022. Patient's main contact is Pastor Sgro, spouse [phone]. Patient lives at home with spouse, Ms. Morales stated patient has imbalance issues and fell in January.  Expected Discharge Plan: [place] Barriers to Discharge: Continued Medical Work up   Patient Goals and CMS Choice     Choice offered to / list presented to : Spouse Mcgregor, Tinnon (Spouse)   [phone] ([place]))  Expected Discharge Plan and Services Expected Discharge Plan: [place] In-house Referral: Clinical Social Work   Post Acute Care Choice: [place] Living arrangements for the past 2 months: Apartment                                      Prior Living Arrangements/Services Living arrangements for the past 2 months: Apartment Lives with:: Spouse Mylon, Mabey (Spouse)   [phone] ([place])) Patient language and need for interpreter reviewed:: Yes Do you feel safe going back to the place where you live?: Yes      Need for Family Participation in Patient Care: Yes (Comment) Care giver support system in place?: Yes (comment)   Criminal Activity/Legal Involvement Pertinent to Current Situation/Hospitalization: No - Comment as needed  Activities of Daily Living Home Assistive Devices/Equipment: None ADL Screening (condition at time of admission) Patient's cognitive ability adequate to safely complete daily  activities?: Yes Is the patient deaf or have difficulty hearing?: Yes Does the patient have difficulty seeing, even when wearing glasses/contacts?: No Does the patient have difficulty concentrating, remembering, or making decisions?: No Patient able to express need for assistance with ADLs?: Yes Does the patient have difficulty dressing or bathing?: Yes Independently performs ADLs?: No Communication: Independent Dressing (OT): Needs assistance Is this a change from baseline?: Change from baseline, expected to last >3 days Grooming: Needs assistance Is this a change from baseline?: Change from baseline, expected to last >3 days Feeding: Independent Bathing: Needs assistance Is this a change from baseline?: Change from baseline, expected to last >3 days Toileting: Needs assistance Is this a change from baseline?: Change from baseline, expected to last >3days In/Out Bed: Needs assistance Is this a change from baseline?: Change from baseline, expected to last >3 days Walks in Home: Needs assistance Is this a change from baseline?: Change from baseline, expected to last >3 days Does the patient have difficulty walking or climbing stairs?: No Weakness of Legs: Left Weakness of Arms/Hands: None  Permission Sought/Granted Permission sought to share information with : Family Supports Copeland, Neisen (Spouse)   [phone] (Mobile)) Permission granted to share information with : Yes, Verbal Permission Granted  Share Information with NAME: Emilian, Stawicki (Spouse)   [phone] ([place])           Emotional Assessment Appearance:: Appears stated age Attitude/Demeanor/Rapport: Engaged Affect (typically observed): Stable Orientation: : Oriented to Situation, Oriented to  Time, Oriented to Place, Oriented to Self Alcohol / Substance Use: Not Applicable Psych Involvement: No (comment)  Admission diagnosis:  Hip fracture ([place]) [S72.009A] Fall with injury [W19.XXXA] Closed left hip fracture  ([place]) [S72.002A] Closed fracture of neck of left femur, initial encounter ([place]) [S72.002A] Patient Active Problem List   Diagnosis Date Noted   Acute urinary retention 02/19/2022   Acute delirium 02/18/2022   Dysphagia 02/18/2022   Hyponatremia 02/15/2022   Closed left hip fracture ([place]) 02/14/2022   Atrial fibrillation with RVR ([place]) 04/12/2021   Bradycardia 07/08/2019   Venous insufficiency of both lower extremities 07/08/2019   Abnormal glucose 01/10/2019   Elevated PSA 01/10/2019   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   Palpitations 06/13/2018   Leg edema, right 09/20/2017   History of BPH 02/21/2017   BPH (benign prostatic hyperplasia) 04/25/2016   Nocturia 04/25/2016   Gout 03/25/2016   History of colon polyps 03/25/2016   Inflammation of a vein 03/25/2016   Percutaneous transluminal coronary angioplasty status 03/25/2016   Supraventricular tachycardia ([place]) 03/25/2016   Benign essential HTN 06/10/2015   Paroxysmal atrial fibrillation ([place]) 01/20/2015   Atrial fibrillation ([place]) 05/13/2014   Coronary artery disease 05/13/2014   Chest pain 05/13/2014   Stage 3a chronic kidney disease (CKD) ([place]) 05/13/2014   Essential (primary) hypertension 05/13/2014   History of cardiac catheterization 05/13/2014   HLD (hyperlipidemia) 05/13/2014   Combined fat and carbohydrate induced hyperlipemia 05/13/2014   History of cardiovascular surgery 05/13/2014   History of anticoagulant therapy 04/28/2014   Warfarin anticoagulation 04/28/2014   Benign prostatic hyperplasia with urinary obstruction 11/05/2013   PCP:  Idelle Crouch, MD Pharmacy:   [facility] [place], [place] - [address] [place] [address] Phone: [phone] Fax: [place] #[id number] [address], [address] [place] [address] Phone: [phone] Fax: [phone]     Social  Determinants of Health (SDOH) Interventions    Readmission Risk Interventions No flowsheet data found.

## 2022-02-20 NOTE — Assessment & Plan Note (Addendum)
Acute urine retention secondary to benign prostate hypertrophy. Condition has improved, Foley cath removed yesterday, patient was able to urinate

## 2022-02-20 NOTE — Progress Notes (Signed)
Subjective:  POD #1 s/p left hip hemiarthroplasty.   Patient reports left pain as mild.  Patient denies chest pain, shortness of breath or nausea and vomiting.  His wife is at the bedside.  Patient states he was able to participate with physical therapy today.    Objective:   VITALS:   Vitals:   02/19/22 1621 02/19/22 2043 02/20/22 0338 02/20/22 0748  BP: (!) 127/59 111/67 129/64 (!) 121/56  Pulse: 72 74 61 60  Resp: 18 20 20 18   Temp: 98.1 F (36.7 C) 99.5 F (37.5 C) 98.7 F (37.1 C) 98.6 F (37 C)  TempSrc:  Oral    SpO2: 100% 94% 94% 95%  Weight:      Height:        PHYSICAL EXAM: Left lower extremity Neurovascular intact Sensation intact distally Intact pulses distally Dorsiflexion/Plantar flexion intact Incision: dressing C/D/I No cellulitis present Compartment soft  LABS  Results for orders placed or performed during the hospital encounter of 02/14/22 (from the past 24 hour(s))  CBC with Differential/Platelet     Status: Abnormal   Collection Time: 02/20/22  4:32 AM  Result Value Ref Range   WBC 7.5 4.0 - 10.5 K/uL   RBC 2.97 (L) 4.22 - 5.81 MIL/uL   Hemoglobin 9.9 (L) 13.0 - 17.0 g/dL   HCT 28.4 (L) 39.0 - 52.0 %   MCV 95.6 80.0 - 100.0 fL   MCH 33.3 26.0 - 34.0 pg   MCHC 34.9 30.0 - 36.0 g/dL   RDW 12.2 11.5 - 15.5 %   Platelets 176 150 - 400 K/uL   nRBC 0.0 0.0 - 0.2 %   Neutrophils Relative % 86 %   Neutro Abs 6.4 1.7 - 7.7 K/uL   Lymphocytes Relative 3 %   Lymphs Abs 0.2 (L) 0.7 - 4.0 K/uL   Monocytes Relative 11 %   Monocytes Absolute 0.8 0.1 - 1.0 K/uL   Eosinophils Relative 0 %   Eosinophils Absolute 0.0 0.0 - 0.5 K/uL   Basophils Relative 0 %   Basophils Absolute 0.0 0.0 - 0.1 K/uL   Immature Granulocytes 0 %   Abs Immature Granulocytes 0.03 0.00 - 0.07 K/uL  Basic metabolic panel     Status: Abnormal   Collection Time: 02/20/22  4:32 AM  Result Value Ref Range   Sodium 131 (L) 135 - 145 mmol/L   Potassium 4.2 3.5 - 5.1 mmol/L    Chloride 96 (L) 98 - 111 mmol/L   CO2 27 22 - 32 mmol/L   Glucose, Bld 145 (H) 70 - 99 mg/dL   BUN 35 (H) 8 - 23 mg/dL   Creatinine, Ser 1.31 (H) 0.61 - 1.24 mg/dL   Calcium 8.3 (L) 8.9 - 10.3 mg/dL   GFR, Estimated 54 (L) >60 mL/min   Anion gap 8 5 - 15  Magnesium     Status: None   Collection Time: 02/20/22  4:32 AM  Result Value Ref Range   Magnesium 2.4 1.7 - 2.4 mg/dL  Protime-INR     Status: None   Collection Time: 02/20/22  4:32 AM  Result Value Ref Range   Prothrombin Time 15.2 11.4 - 15.2 seconds   INR 1.2 0.8 - 1.2    DG HIP UNILAT WITH PELVIS 1V LEFT  Result Date: 02/19/2022 CLINICAL DATA:  Left hip surgery EXAM: DG HIP (WITH OR WITHOUT PELVIS) 1V*L* COMPARISON:  02/14/2022 FINDINGS: Interval postsurgical changes from left hip hemiarthroplasty. Arthroplasty components are in their expected alignment. No  periprosthetic fracture or evidence of other complication. Expected postoperative changes within the overlying soft tissues. IMPRESSION: Negative. Electronically Signed   By: Davina Poke D.O.   On: 02/19/2022 13:29    Assessment/Plan: 1 Day Post-Op   Principal Problem:   Closed left hip fracture (HCC) Active Problems:   Coronary artery disease   Stage 3a chronic kidney disease (CKD) (HCC)   Essential (primary) hypertension   Warfarin anticoagulation   Paroxysmal atrial fibrillation (HCC)   BPH (benign prostatic hyperplasia)   Hyponatremia   Acute delirium   Dysphagia   Acute urinary retention  Patient states he is happy and feeling well.  He denies any significant pain in her left hip.  He is making progress with physical and Occupational Therapy.  Patient will require a skilled nursing facility upon discharge.  Patient restarted on his Coumadin.  His INR today is 1.2.  His hemoglobin is 9.9.  Patient is weightbearing as tolerated on the left lower extremity with posterior hip precautions.    Thornton [place] , MD 02/20/2022, 4:42 PM

## 2022-02-20 NOTE — Progress Notes (Signed)
ANTICOAGULATION CONSULT NOTE  Pharmacy Consult for warfarin Indication: atrial fibrillation  Allergies  Allergen Reactions   Codeine Nausea And Vomiting and Nausea Only   Tizanidine Other (See Comments)    drowsiness    Patient Measurements: Height: 6\' 1"  (185.4 cm) Weight: 98.2 kg (216 lb 7.9 oz) IBW/kg (Calculated) : 79.9  Labs: Recent Labs    02/18/22 1346 02/19/22 0423 02/20/22 0432  HGB  --   --  9.9*  HCT  --   --  28.4*  PLT  --   --  176  LABPROT 15.6*  --  15.2  INR 1.2  --  1.2  CREATININE  --  1.84* 1.31*     Estimated Creatinine Clearance: 53.6 mL/min (A) (by C-G formula based on SCr of 1.31 mg/dL (H)).   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   L upper back 5.0cm inf to base of neck, 5.0cm lat to spine   Hyperlipidemia    Hypertension    MI, old    Nocturia    Phlebitis    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   SCC IS R foreearm distal   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   SCC IS R forearm proximal   Squamous cell carcinoma of skin 03/26/2018   SCC IS R forearm   Squamous cell carcinoma of skin 01/01/2008   SCC IS R forearm   Wears dentures    partial    Medications:  PTA warfarin regimen: 3 mg daily - last dose was 02/13/22  Assessment: 81 year old man with past medical history of atrial fibrillation on Coumadin, arthritis, BPH, basal and squamous cell skin cancer, chronic kidney disease and hypertension. Patient presents after a mechanical fall.  INR 2.5 on 02/15/2022 (INR therapeutic on PTA  regimen). Received vitamin K to lower INR to allow for procedure. Pharmacy consulted for restarting warfarin.   2/24 INR 1.2 2/25 none Warfarin 5 mg 2/26 INR 1.2  DDI: cefazolin, dronedarone  Goal of Therapy:  INR 2-3 Monitor platelets by anticoagulation protocol: Yes   Plan:  INR subtherapeutic s/p receiving vitamin K and holding doses to allow for procedure.  Will order warfarin 5 mg again x1 tonight. Hope to transition patient back to PTA regimen as he was therapeutic on this.  CBC per protocol  Fontaine Kossman A 02/20/2022,10:58 AM

## 2022-02-20 NOTE — Plan of Care (Signed)
°  Problem: Education: Goal: Knowledge of General Education information will improve Description: Including pain rating scale, medication(s)/side effects and non-pharmacologic comfort measures Outcome: Progressing   Problem: Clinical Measurements: Goal: Ability to maintain clinical measurements within normal limits will improve Outcome: Progressing   Problem: Clinical Measurements: Goal: Will remain free from infection Outcome: Progressing   Problem: Clinical Measurements: Goal: Respiratory complications will improve Outcome: Progressing   Problem: Nutrition: Goal: Adequate nutrition will be maintained Outcome: Progressing   Problem: Coping: Goal: Level of anxiety will decrease Outcome: Progressing   Problem: Elimination: Goal: Will not experience complications related to urinary retention Outcome: Progressing   Problem: Safety: Goal: Ability to remain free from injury will improve Outcome: Progressing   Problem: Skin Integrity: Goal: Risk for impaired skin integrity will decrease Outcome: Progressing

## 2022-02-21 ENCOUNTER — Encounter: Payer: Self-pay | Admitting: Orthopedic Surgery

## 2022-02-21 DIAGNOSIS — R338 Other retention of urine: Secondary | ICD-10-CM

## 2022-02-21 DIAGNOSIS — E871 Hypo-osmolality and hyponatremia: Secondary | ICD-10-CM | POA: Diagnosis not present

## 2022-02-21 DIAGNOSIS — S72002A Fracture of unspecified part of neck of left femur, initial encounter for closed fracture: Secondary | ICD-10-CM | POA: Diagnosis not present

## 2022-02-21 LAB — BASIC METABOLIC PANEL
Anion gap: 11 (ref 5–15)
BUN: 38 mg/dL — ABNORMAL HIGH (ref 8–23)
CO2: 27 mmol/L (ref 22–32)
Calcium: 8 mg/dL — ABNORMAL LOW (ref 8.9–10.3)
Chloride: 95 mmol/L — ABNORMAL LOW (ref 98–111)
Creatinine, Ser: 1.49 mg/dL — ABNORMAL HIGH (ref 0.61–1.24)
GFR, Estimated: 47 mL/min — ABNORMAL LOW (ref >60)
Glucose, Bld: 103 mg/dL — ABNORMAL HIGH (ref 70–99)
Potassium: 3.9 mmol/L (ref 3.5–5.1)
Sodium: 133 mmol/L — ABNORMAL LOW (ref 135–145)

## 2022-02-21 LAB — CBC WITH DIFFERENTIAL/PLATELET
Abs Immature Granulocytes: 0.01 10*3/uL (ref 0.00–0.07)
Basophils Absolute: 0 10*3/uL (ref 0.0–0.1)
Basophils Relative: 0 %
Eosinophils Absolute: 0 10*3/uL (ref 0.0–0.5)
Eosinophils Relative: 1 %
HCT: 27.1 % — ABNORMAL LOW (ref 39.0–52.0)
Hemoglobin: 9.2 g/dL — ABNORMAL LOW (ref 13.0–17.0)
Immature Granulocytes: 0 %
Lymphocytes Relative: 8 %
Lymphs Abs: 0.5 10*3/uL — ABNORMAL LOW (ref 0.7–4.0)
MCH: 33 pg (ref 26.0–34.0)
MCHC: 33.9 g/dL (ref 30.0–36.0)
MCV: 97.1 fL (ref 80.0–100.0)
Monocytes Absolute: 1 10*3/uL (ref 0.1–1.0)
Monocytes Relative: 16 %
Neutro Abs: 4.8 10*3/uL (ref 1.7–7.7)
Neutrophils Relative %: 75 %
Platelets: 187 10*3/uL (ref 150–400)
RBC: 2.79 MIL/uL — ABNORMAL LOW (ref 4.22–5.81)
RDW: 12.4 % (ref 11.5–15.5)
WBC: 6.3 10*3/uL (ref 4.0–10.5)
nRBC: 0 % (ref 0.0–0.2)

## 2022-02-21 LAB — PROTIME-INR
INR: 1.3 — ABNORMAL HIGH (ref 0.8–1.2)
Prothrombin Time: 16.3 seconds — ABNORMAL HIGH (ref 11.4–15.2)

## 2022-02-21 LAB — MAGNESIUM: Magnesium: 2.4 mg/dL (ref 1.7–2.4)

## 2022-02-21 MED ORDER — AMIODARONE HCL 200 MG PO TABS
200.0000 mg | ORAL_TABLET | Freq: Every day | ORAL | Status: DC
Start: 2022-02-22 — End: 2022-02-22
  Administered 2022-02-22: 200 mg via ORAL
  Filled 2022-02-21: qty 1

## 2022-02-21 MED ORDER — SENNOSIDES-DOCUSATE SODIUM 8.6-50 MG PO TABS
2.0000 | ORAL_TABLET | Freq: Two times a day (BID) | ORAL | Status: DC
  Administered 2022-02-21 – 2022-02-22 (×2): 2 via ORAL
  Filled 2022-02-21 (×2): qty 2

## 2022-02-21 MED ORDER — LACTULOSE 10 GM/15ML PO SOLN
20.0000 g | Freq: Two times a day (BID) | ORAL | Status: AC
  Administered 2022-02-21 (×2): 20 g via ORAL
  Filled 2022-02-21 (×2): qty 30

## 2022-02-21 MED ORDER — WARFARIN SODIUM 6 MG PO TABS
6.0000 mg | ORAL_TABLET | Freq: Once | ORAL | Status: AC
  Administered 2022-02-21: 6 mg via ORAL
  Filled 2022-02-21: qty 1

## 2022-02-21 NOTE — TOC Progression Note (Signed)
Transition of Care [facility]) - Progression Note    Patient Details  Name: CARSON BOGDEN MRN: [id number] Date of Birth: Nov 18, 1941  Transition of Care [facility]) CM/SW Contact  Eileen Stanford, LCSW Phone Number: 02/21/2022, 2:41 PM  Clinical Narrative:   CSW provided b/o to spouse and she chooses [facility]. CSW to start auth.     Expected Discharge Plan: [place] Barriers to Discharge: Continued Medical Work up  Expected Discharge Plan and Services Expected Discharge Plan: [place] In-house Referral: Clinical Social Work   Post Acute Care Choice: [place] Living arrangements for the past 2 months: Apartment                                       Social Determinants of Health (SDOH) Interventions    Readmission Risk Interventions No flowsheet data found.

## 2022-02-21 NOTE — Progress Notes (Signed)
ANTICOAGULATION CONSULT NOTE  Pharmacy Consult for warfarin Indication: atrial fibrillation  Allergies  Allergen Reactions   Codeine Nausea And Vomiting and Nausea Only   Tizanidine Other (See Comments)    drowsiness    Patient Measurements: Height: 6\' 1"  (185.4 cm) Weight: 98.2 kg (216 lb 7.9 oz) IBW/kg (Calculated) : 79.9  Labs: Recent Labs    02/19/22 0423 02/20/22 0432 02/21/22 0504  HGB  --  9.9* 9.2*  HCT  --  28.4* 27.1*  PLT  --  176 187  LABPROT  --  15.2 16.3*  INR  --  1.2 1.3*  CREATININE 1.84* 1.31* 1.49*     Estimated Creatinine Clearance: 47.1 mL/min (A) (by C-G formula based on SCr of 1.49 mg/dL (H)).   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   L upper back 5.0cm inf to base of neck, 5.0cm lat to spine   Hyperlipidemia    Hypertension    MI, old    Nocturia    Phlebitis    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   SCC IS R foreearm distal   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   SCC IS R forearm proximal   Squamous cell carcinoma of skin 03/26/2018   SCC IS R forearm   Squamous cell carcinoma of skin 01/01/2008   SCC IS R forearm   Wears dentures    partial    Medications:  PTA warfarin regimen: 3 mg daily - last dose was 02/13/22  Assessment: 81 year old man with past medical history of atrial fibrillation on Coumadin, arthritis, BPH, basal and squamous cell skin cancer, chronic kidney disease and hypertension. Patient presents after a mechanical fall.  INR 2.5 on 02/15/2022 (INR therapeutic on PTA  regimen). Received vitamin K to lower INR to allow for procedure. Pharmacy consulted for restarting warfarin.  INR 2.6 (therapeutic) on admission   2/21: 5 mg PO Vitamin K 2/22: 10 mg IV Vitamin K 2/25: Procedure  Date INR Warfarin Dose  2/24 1.2  2/25 -- 5 mg 2/26 1.2 5 mg 2/27 1.3 6 mg   DDI: cefazolin (2/25 surgical ppx), amiodarone (PTA)  Goal of Therapy:  INR 2-3 Monitor platelets by anticoagulation protocol: Yes   Plan:  INR remains subtherapeutic s/p receiving vitamin K and holding doses to allow for procedure.  Will order warfarin 6 mg again x1 tonight.  Hope to transition patient back to PTA regimen as he was therapeutic on this.  CBC per protocol  Dorothe Pea, PharmD, BCPS Clinical Pharmacist   02/21/2022,2:46 PM

## 2022-02-21 NOTE — Progress Notes (Signed)
Occupational Therapy Treatment Patient Details Name: KHA HARI MRN: [id number] DOB: 08-Apr-1941 Today's Date: 02/21/2022   History of present illness LYNWOOD KUBISIAK is a 81 y.o. male with medical history significant of PSVT s/p ablation 2018, frequent PVCs, ectopic atrioventricular node tachycardia, paroxysmal atrial fibrillation status post ablation 2022 on warfarin and amiodarone, followed by EP at [facility], paroxysmal atrial flutter, coronary artery disease s/p CABG 1992 with stents in 2012, , HTN, CKD 3, BPH, gout, CAD,  who presents to the ED with acute left hip pain sustained after he fell onto his left hip after tripping over a rug.  S/P THA.   OT comments  Pt seen for OT/PT co-treatment on this date. Upon arrival to room, pt awake and seated upright in recliner with wife present. Pt reporting feeling more drowsy than usual (suspect d/t pain medication) however agreeable to tx. Pt educated on use of AD (e.g., long handled shoe horn, reacher, and sock aide) for LB dressing, pt verbalized understanding however required MOD A to don socks with use of sock aide d/t difficulty recalling instructions and d/t significant pain with any LLE movement (pt premedicated before session). Pt required MOD A+2 for step pivot recliner>bed and TOTAL A for sit>supine d/t LLE pain, decreased balance, and difficulty sequencing transfer. Pt is making good progress toward goals and continues to benefit from skilled OT services to maximize return to PLOF and minimize risk of future falls, injury, caregiver burden, and readmission. Will continue to follow POC. Discharge recommendation remains appropriate.     Recommendations for follow up therapy are one component of a multi-disciplinary discharge planning process, led by the attending physician.  Recommendations may be updated based on patient status, additional functional criteria and insurance authorization.    Follow Up Recommendations  Skilled nursing-short term rehab  (<3 hours/day)    Assistance Recommended at Discharge Intermittent Supervision/Assistance  Patient can return home with the following  Two people to help with walking and/or transfers;Two people to help with bathing/dressing/bathroom;Assistance with cooking/housework   Equipment Recommendations  Other (comment) (defer to next venue of care)       Precautions / Restrictions Precautions Precautions: Posterior Hip Precaution Booklet Issued: Yes (comment) Restrictions Weight Bearing Restrictions: Yes LLE Weight Bearing: Weight bearing as tolerated       Mobility Bed Mobility Overal bed mobility: Needs Assistance Bed Mobility: Sit to Supine       Sit to supine: Total assist   General bed mobility comments: Required total assist for sit to supine this pm due to pain    Transfers Overall transfer level: Needs assistance Equipment used: Rolling walker (2 wheels) Transfers: Sit to/from Stand Sit to Stand: Mod assist, +2 physical assistance                 Balance Overall balance assessment: Needs assistance, History of Falls Sitting-balance support: Feet supported Sitting balance-Leahy Scale: Poor Sitting balance - Comments: difficulty in sitting due to pain Postural control: Right lateral lean Standing balance support: Bilateral upper extremity supported, During functional activity, Reliant on assistive device for balance Standing balance-Leahy Scale: Poor Standing balance comment: Heavy mod +2 for standing. Left lateral leaning in standing. Difficulty maintaining upright posture.                           ADL either performed or assessed with clinical judgement   ADL Overall ADL's : Needs assistance/impaired  Lower Body Dressing: Moderate assistance;Sitting/lateral leans Lower Body Dressing Details (indicate cue type and reason): To don socks with sock aide; requires assist to thread sock correctly on device and to assist to  align foot with sock aide.                      Cognition Arousal/Alertness: Awake/alert Behavior During Therapy: WFL for tasks assessed/performed Overall Cognitive Status: Within Functional Limits for tasks assessed                                                     Pertinent Vitals/ Pain       Pain Assessment Pain Assessment: Faces Faces Pain Scale: Hurts worst Pain Location: L hip Pain Descriptors / Indicators: Grimacing, Guarding, Moaning, Discomfort, Sore, Operative site guarding Pain Intervention(s): Limited activity within patient's tolerance, Monitored during session, Premedicated before session, Repositioned         Frequency  Min 2X/week        Progress Toward Goals  OT Goals(current goals can now be found in the care plan section)  Progress towards OT goals: Progressing toward goals  Acute Rehab OT Goals Patient Stated Goal: to resume going to the gym OT Goal Formulation: With patient Time For Goal Achievement: 03/06/22 Potential to Achieve Goals: Good  Plan Discharge plan remains appropriate;Frequency remains appropriate    Co-evaluation    PT/OT/SLP Co-Evaluation/Treatment: Yes Reason for Co-Treatment: For patient/therapist safety;To address functional/ADL transfers PT goals addressed during session: Mobility/safety with mobility;Balance;Proper use of DME OT goals addressed during session: ADL's and self-care;Proper use of Adaptive equipment and DME      AM-PAC OT "6 Clicks" Daily Activity     Outcome Measure   Help from another person eating meals?: None Help from another person taking care of personal grooming?: A Little Help from another person toileting, which includes using toliet, bedpan, or urinal?: A Lot Help from another person bathing (including washing, rinsing, drying)?: A Lot Help from another person to put on and taking off regular upper body clothing?: A Little Help from another person to put on and  taking off regular lower body clothing?: A Lot 6 Click Score: 16    End of Session Equipment Utilized During Treatment: Gait belt;Rolling walker (2 wheels)  OT Visit Diagnosis: Unsteadiness on feet (R26.81);Muscle weakness (generalized) (M62.81);Pain;History of falling (Z91.81) Pain - Right/Left: Left Pain - part of body: Hip   Activity Tolerance Patient limited by pain   Patient Left in bed;with call bell/phone within reach;with bed alarm set;with chair alarm set   Nurse Communication Mobility status        Time: 7262-0355 OT Time Calculation (min): 35 min  Charges: OT General Charges $OT Visit: 1 Visit OT Treatments $Self Care/Home Management : 8-22 mins  Fredirick Maudlin, OTR/L [place]

## 2022-02-21 NOTE — Progress Notes (Signed)
[facility]  ROUNDING NOTE   Subjective:   Patient seen sitting up in chair Tolerating meals without nausea and vomiting Reports pain controlled with prescribed medications  Sodium 133  Objective:  Vital signs in last 24 hours:  Temp:  [98.3 F (36.8 C)] 98.3 F (36.8 C) (02/26 1747) Pulse Rate:  [56-68] 61 (02/27 1213) Resp:  [16-18] 16 (02/27 0732) BP: (99-129)/(47-65) 110/47 (02/27 1213) SpO2:  [94 %-97 %] 94 % (02/27 1213)  Weight change:  Filed Weights   02/14/22 1932 02/14/22 2200  Weight: 95.3 kg 98.2 kg    Intake/Output: I/O last 3 completed shifts: In: 700 [P.O.:700] Out: 2000 [Urine:2000]   Intake/Output this shift:  Total I/O In: 360 [P.O.:360] Out: 450 [Urine:450]  Physical Exam: General: NAD  Head: Normocephalic, atraumatic. Moist oral mucosal membranes  Eyes: Anicteric  Lungs:  Clear to auscultation, normal effort  Heart: Regular rate and rhythm  Abdomen:  Soft, nontender  Extremities: No peripheral edema.  Neurologic: Nonfocal, moving all four extremities  Skin: No lesions, left hip surgical dressing  Access: None    Basic Metabolic Panel: Recent Labs  Lab 02/17/22 0308 02/17/22 0841 02/17/22 0953 02/17/22 1813 02/18/22 0227 02/19/22 0423 02/20/22 0432 02/21/22 0504  NA 122*   < > 127* 128* 128* 133* 131* 133*  K 3.9  --  3.9  --   --  3.7 4.2 3.9  CL 88*  --  90*  --   --  94* 96* 95*  CO2 28  --  28  --   --  29 27 27   GLUCOSE 106*  --  98  --   --  110* 145* 48*  BUN 20  --  18  --   --  47* 35* 38*  CREATININE 1.08  --  1.02  --   --  1.84* 1.31* 1.49*  CALCIUM 8.2*  --  8.4*  --   --  8.7* 8.3* 8.0*  MG 2.0  --   --   --   --   --  2.4 2.4   < > = values in this interval not displayed.     Liver Function Tests: Recent Labs  Lab 02/17/22 0953  AST 22  ALT 23  ALKPHOS 35*  BILITOT 1.2  PROT 6.0*  ALBUMIN 3.4*    No results for input(s): LIPASE, AMYLASE in the last 168 hours. No results for input(s):  AMMONIA in the last 168 hours.  CBC: Recent Labs  Lab 02/14/22 1940 02/16/22 0403 02/17/22 0308 02/20/22 0432 02/21/22 0504  WBC 3.7* 6.5 6.9 7.5 6.3  NEUTROABS 2.1  --  5.1 6.4 4.8  HGB 12.3* 11.9* 12.1* 9.9* 9.2*  HCT 35.8* 34.6* 34.3* 28.4* 27.1*  MCV 97.5 96.9 95.5 95.6 97.1  PLT 199 173 168 176 187     Cardiac Enzymes: No results for input(s): CKTOTAL, CKMB, CKMBINDEX, TROPONINI in the last 168 hours.  BNP: Invalid input(s): POCBNP  CBG: No results for input(s): GLUCAP in the last 168 hours.  Microbiology: Results for orders placed or performed during the hospital encounter of 02/14/22  Resp Panel by RT-PCR (Flu A&B, Covid) Nasopharyngeal Swab     Status: None   Collection Time: 02/14/22  7:40 PM   Specimen: Nasopharyngeal Swab; Nasopharyngeal(NP) swabs in vial transport medium  Result Value Ref Range Status   SARS Coronavirus 2 by RT PCR NEGATIVE NEGATIVE Final    Comment: (NOTE) SARS-CoV-2 target nucleic acids are NOT DETECTED.  The  SARS-CoV-2 RNA is generally detectable in upper respiratory specimens during the acute phase of infection. The lowest concentration of SARS-CoV-2 viral copies this assay can detect is 138 copies/mL. A negative result does not preclude SARS-Cov-2 infection and should not be used as the sole basis for treatment or other patient management decisions. A negative result may occur with  improper specimen collection/handling, submission of specimen other than nasopharyngeal swab, presence of viral mutation(s) within the areas targeted by this assay, and inadequate number of viral copies(<138 copies/mL). A negative result must be combined with clinical observations, patient history, and epidemiological information. The expected result is Negative.  Fact Sheet for Patients:  EntrepreneurPulse.com.au  Fact Sheet for Healthcare Providers:  IncredibleEmployment.be  This test is no t yet approved or  cleared by the [place] FDA and  has been authorized for detection and/or diagnosis of SARS-CoV-2 by FDA under an Emergency Use Authorization (EUA). This EUA will remain  in effect (meaning this test can be used) for the duration of the COVID-19 declaration under Section 564(b)(1) of the Act, 21 U.S.C.section 360bbb-3(b)(1), unless the authorization is terminated  or revoked sooner.       Influenza A by PCR NEGATIVE NEGATIVE Final   Influenza B by PCR NEGATIVE NEGATIVE Final    Comment: (NOTE) The Xpert Xpress SARS-CoV-2/FLU/RSV plus assay is intended as an aid in the diagnosis of influenza from Nasopharyngeal swab specimens and should not be used as a sole basis for treatment. Nasal washings and aspirates are unacceptable for Xpert Xpress SARS-CoV-2/FLU/RSV testing.  Fact Sheet for Patients: EntrepreneurPulse.com.au  Fact Sheet for Healthcare Providers: IncredibleEmployment.be  This test is not yet approved or cleared by the [place] FDA and has been authorized for detection and/or diagnosis of SARS-CoV-2 by FDA under an Emergency Use Authorization (EUA). This EUA will remain in effect (meaning this test can be used) for the duration of the COVID-19 declaration under Section 564(b)(1) of the Act, 21 U.S.C. section 360bbb-3(b)(1), unless the authorization is terminated or revoked.  Performed at [facility], [address] [place], [place] [address]   Surgical PCR screen     Status: Abnormal   Collection Time: 02/14/22 10:51 PM   Specimen: Nasal Mucosa; Nasal Swab  Result Value Ref Range Status   MRSA, PCR POSITIVE (A) NEGATIVE Final    Comment: RESULT CALLED TO, READ BACK BY AND VERIFIED WITH: Luna Fuse Betso@0025  02/15/22 RH    Staphylococcus aureus POSITIVE (A) NEGATIVE Final    Comment: (NOTE) The Xpert SA Assay (FDA approved for NASAL specimens in patients 6 years of age and older), is one component of a  comprehensive surveillance program. It is not intended to diagnose infection nor to guide or monitor treatment. Performed at [facility], [address] [place], [place] [address]     Coagulation Studies: Recent Labs    02/18/22 1346 02/20/22 0432 02/21/22 0504  LABPROT 15.6* 15.2 16.3*  INR 1.2 1.2 1.3*     Urinalysis: No results for input(s): COLORURINE, LABSPEC, PHURINE, GLUCOSEU, HGBUR, BILIRUBINUR, KETONESUR, PROTEINUR, UROBILINOGEN, NITRITE, LEUKOCYTESUR in the last 72 hours.  Invalid input(s): APPERANCEUR    Imaging: DG HIP UNILAT WITH PELVIS 1V LEFT  Result Date: 02/19/2022 CLINICAL DATA:  Left hip surgery EXAM: DG HIP (WITH OR WITHOUT PELVIS) 1V*L* COMPARISON:  02/14/2022 FINDINGS: Interval postsurgical changes from left hip hemiarthroplasty. Arthroplasty components are in their expected alignment. No periprosthetic fracture or evidence of other complication. Expected postoperative changes within the overlying soft tissues. IMPRESSION: Negative.  Electronically Signed   By: Davina Poke D.O.   On: 02/19/2022 13:29     Medications:    methocarbamol (ROBAXIN) IV      [START ON 02/22/2022] amiodarone  200 mg Oral Daily   Chlorhexidine Gluconate Cloth  6 each Topical Daily   ezetimibe  10 mg Oral QHS   feeding supplement  237 mL Oral BID BM   ferrous sulfate  325 mg Oral TID PC   furosemide  20 mg Oral BID   hydrALAZINE  10 mg Oral Q8H   lactulose  20 g Oral BID   metoprolol tartrate  25 mg Oral BID   montelukast  10 mg Oral QHS   multivitamin with minerals  1 tablet Oral Daily   QUEtiapine  25 mg Oral QHS   senna-docusate  2 tablet Oral BID   simvastatin  10 mg Oral QHS   sodium chloride  2 g Oral BID WC   tamsulosin  0.4 mg Oral BID   traMADol  50 mg Oral Q6H   Warfarin - Pharmacist Dosing Inpatient   Does not apply q1600   acetaminophen, alum & mag hydroxide-simeth, benzonatate, bisacodyl, haloperidol lactate, HYDROmorphone (DILAUDID)  injection, menthol-cetylpyridinium **OR** phenol, methocarbamol **OR** methocarbamol (ROBAXIN) IV, nitroGLYCERIN, ondansetron **OR** ondansetron (ZOFRAN) IV, oxyCODONE, oxyCODONE, polyethylene glycol  Assessment/ Plan:  Robert Rivas is a 81 y.o.  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  IV hydration given overnight with little response.  Sodium 128 on admission.    Tolvaptan given twice this admission.  Sodium improved to 133 today.  Continue to encourage oral intake.  Due to satisfactory sodium recovered, we will sign off at this time.  Please contact us with further questions or concerns.   LOS: 7 [place] 2/27/20231:15 PM

## 2022-02-21 NOTE — Progress Notes (Signed)
°  Progress Note   [name] ZDG:[id number] DOB: February 21, 1941 DOA: 02/14/2022     7 DOS: the patient was seen and examined on 02/21/2022   Brief hospital course: 81 year old man with past medical history of atrial fibrillation on Coumadin, arthritis, BPH, basal and squamous cell skin cancer, chronic kidney disease and hypertension.  Patient presents after a mechanical fall.  INR 2.5 on 02/15/2022.  Small doses of vitamin K given.  INR will have to be under 1.5 in order to go for hemiarthroplasty. Patient developed acute hyponatremia, received tolvaptan.  Patient also developed significant delirium, was given Haldol and Seroquel. Left hip hemiarthroplasty performed on 2/25  Assessment and Plan: * Closed left hip fracture ([place])- (present on admission) Patient is postop day #2, doing well.  Currently looking for nursing placement  Acute urinary retention Patient had a large bowel movement today after giving lactulose.  We will attempt to remove Foley catheter and a bladder scan as needed.  Dysphagia Patient is evaluated by speech therapy,  does not have a high risk for aspiration.  Acute delirium Condition has improved.  Continue Seroquel 25 mg every evening.  Hyponatremia Continue fluid restriction and salt tablets.  BPH (benign prostatic hyperplasia) Flomax dose increased.  Paroxysmal atrial fibrillation ([place])- (present on admission) Continue beta-blocker, warfarin resumed.  Warfarin anticoagulation Resume warfarin.  Essential (primary) hypertension- (present on admission) Stable  Stage 3a chronic kidney disease (CKD) ([place])  stable.  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     Atrial fibrillation (HCC) Continue beta-blocker, continue hold anticoagulation.         Subjective:  Patient doing well, had a large bowel movement after giving lactulose again today. Denies any short of breath or cough. No  confusion.  Physical Exam: Vitals:   02/20/22 1747 02/20/22 1955 02/21/22 0511 02/21/22 0732  BP: 107/62 (!) 104/59 129/61 110/65  Pulse: (!) 56 (!) 56 64 68  Resp: 16 18 16 16   Temp: 98.3 F (36.8 C)     TempSrc:      SpO2: 97% 97% 94%   Weight:      Height:       General exam: Appears calm and comfortable  Respiratory system: Clear to auscultation. Respiratory effort normal. Cardiovascular system: S1 & S2 heard, RRR. No JVD, murmurs, rubs, gallops or clicks. No pedal edema. Gastrointestinal system: Abdomen is nondistended, soft and nontender. No organomegaly or masses felt. Normal bowel sounds heard. Central nervous system: Alert and oriented. No focal neurological deficits. Extremities: Symmetric 5 x 5 power. Skin: No rashes, lesions or ulcers Psychiatry: Judgement and insight appear normal. Mood & affect appropriate.    Data Reviewed: Reviewed all labs.  Family Communication:   Disposition: Status is: Inpatient Remains inpatient appropriate because: Unsafe discharge, pending nursing home placement.          Planned Discharge Destination: Skilled nursing facility     Time spent: 25 minutes  Author: Sharen Hones, MD 02/21/2022 11:09 AM  For on call review [email or web address].

## 2022-02-21 NOTE — Anesthesia Postprocedure Evaluation (Signed)
Anesthesia Post Note  Patient: Derico Mitton [facility]  Procedure(s) Performed: ARTHROPLASTY BIPOLAR HIP (HEMIARTHROPLASTY) (Left: Hip)  Patient location during evaluation: PACU Anesthesia Type: General Level of consciousness: awake and alert Pain management: pain level controlled Vital Signs Assessment: post-procedure vital signs reviewed and stable Respiratory status: spontaneous breathing, nonlabored ventilation, respiratory function stable and patient connected to nasal cannula oxygen Cardiovascular status: blood pressure returned to baseline and stable Postop Assessment: no apparent nausea or vomiting Anesthetic complications: no   No notable events documented.   Last Vitals:  Vitals:   02/21/22 0511 02/21/22 0732  BP: 129/61 110/65  Pulse: 64 68  Resp: 16 16  Temp:    SpO2: 94%     Last Pain:  Vitals:   02/21/22 1029  TempSrc:   PainSc: 10-Worst pain ever                 Martha Clan

## 2022-02-21 NOTE — Plan of Care (Signed)
°  Problem: Education: Goal: Knowledge of General Education information will improve Description: Including pain rating scale, medication(s)/side effects and non-pharmacologic comfort measures Outcome: Progressing   Problem: Clinical Measurements: Goal: Will remain free from infection Outcome: Progressing   Problem: Clinical Measurements: Goal: Respiratory complications will improve Outcome: Progressing   Problem: Clinical Measurements: Goal: Cardiovascular complication will be avoided Outcome: Progressing   Problem: Coping: Goal: Level of anxiety will decrease Outcome: Progressing   Problem: Elimination: Goal: Will not experience complications related to urinary retention Outcome: Progressing   Problem: Elimination: Goal: Will not experience complications related to bowel motility Outcome: Progressing   Problem: Pain Managment: Goal: General experience of comfort will improve Outcome: Progressing

## 2022-02-21 NOTE — Progress Notes (Signed)
Nutrition Follow-up  DOCUMENTATION CODES:   Not applicable  INTERVENTION:   -Continue Ensure Enlive po BID, each supplement provides 350 kcal and 20 grams of protein.  -Continue MVI with minerals daily -No documented BM since 02/15/22; MD to start bowel regimen (lactulose) per discussion  NUTRITION DIAGNOSIS:   Increased nutrient needs related to hip fracture as evidenced by estimated needs.  Ongoing  GOAL:   Patient will meet greater than or equal to 90% of their needs  Progressing   MONITOR:   PO intake, Supplement acceptance, Labs, Weight trends, Skin  REASON FOR ASSESSMENT:   Consult Hip fracture protocol  ASSESSMENT:   Pt admitted from home after tripping over his rug and sustaining a L hip fracture. 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  2/24- s/p BSE- advanced to dysphagia 3 diet with thin liquids 2/25- s/p Procedure(s): Left hip hemiarthroplasty  Reviewed I/O's: +80 ml x 24 hours and -3 L since admission  UOP: 400 ml x 24 hours   Pt unavailable at time of visit. Attempted to speak with pt via call to hospital room phone, however, unable to reach.   Pt on a dysphagia 3 diet with 1.5 L fluid restriction. Pt with good appetite. Noted meal completions 50-100%. Pt drinking Ensure supplements.   Case discussed with MD; noted no BM since 02/15/22. Per MD, lactulose ordered yesterday and will schedule for BID.   Per TOC notes, plan to d/c to SNF once medically stable.   Medications reviewed and include colace, ferrous sulfate, lasix, and senokot.   Labs reviewed: Na: 133.   Diet Order:   Diet Order             DIET DYS 3 Room service appropriate? Yes; Fluid consistency: Thin; Fluid restriction: 1500 mL Fluid  Diet effective now                   EDUCATION NEEDS:    Education needs have been addressed  Skin:  Skin Assessment: Skin Integrity Issues: Skin Integrity Issues:: Incisions Incisions: closed lt hip  Last BM:  02/15/22  Height:   Ht Readings from Last 1 Encounters:  02/14/22 6\' 1"  (1.854 m)    Weight:   Wt Readings from Last 1 Encounters:  02/14/22 98.2 kg   BMI:  Body mass index is 28.56 kg/m.  Estimated Nutritional Needs:   Kcal:  2000-2200  Protein:  100-115g  Fluid:  >/=2L    Loistine Chance, RD, LDN, [place] Registered Dietitian II Certified Diabetes Care and Education Specialist Please refer to [facility] for RD and/or RD on-call/weekend/after hours pager

## 2022-02-21 NOTE — Progress Notes (Incomplete)
[place] for warfarin Indication: atrial fibrillation  Allergies  Allergen Reactions   Codeine Nausea And Vomiting and Nausea Only   Tizanidine Other (See Comments)    drowsiness    Patient Measurements: Height: 6\' 1"  (185.4 cm) Weight: 98.2 kg (216 lb 7.9 oz) IBW/kg (Calculated) : 79.9  Labs: Recent Labs    02/18/22 1346 02/19/22 0423 02/20/22 0432 02/21/22 0504  HGB  --   --  9.9* 9.2*  HCT  --   --  28.4* 27.1*  PLT  --   --  176 187  LABPROT 15.6*  --  15.2 16.3*  INR 1.2  --  1.2 1.3*  CREATININE  --  1.84* 1.31* 1.49*     Estimated Creatinine Clearance: 47.1 mL/min (A) (by C-G formula based on SCr of 1.49 mg/dL (H)).   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   L upper back 5.0cm inf to base of neck, 5.0cm lat to spine   Hyperlipidemia    Hypertension    MI, old    Nocturia    Phlebitis    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   SCC IS R foreearm distal   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   SCC IS R forearm proximal   Squamous cell carcinoma of skin 03/26/2018   SCC IS R forearm   Squamous cell carcinoma of skin 01/01/2008   SCC IS R forearm   Wears dentures    partial    Medications:  PTA warfarin regimen: 3 mg daily - last dose was 02/13/22  Assessment: 81 year old man with past medical history of atrial fibrillation on Coumadin, arthritis, BPH, basal and squamous cell skin cancer, chronic kidney disease and hypertension. Patient presents after a mechanical fall. INR 2.5  on 02/15/2022 (INR therapeutic on PTA regimen). Received vitamin K to lower INR to allow for procedure. Pharmacy consulted for restarting warfarin.   02/24 INR 1.2 @1346  02/25 Surgery  02/26 INR 1.2 @0432  02/27 INR 1.3 @0504   Goal of Therapy:  INR 2-3 Monitor platelets by anticoagulation protocol: Yes   Plan:  INR subtherapeutic s/p receiving vitamin K and holding doses to allow for procedure.  Will order warfarin 3 mg tonight due the start of PTA amiodarone  CBC per protocol  Jet Traynham 02/21/2022,11:59 AM

## 2022-02-21 NOTE — Progress Notes (Signed)
Physical Therapy Treatment Patient Details Name: Robert Rivas MRN: [id number] DOB: April 10, 1941 Today's Date: 02/21/2022   History of Present Illness RAUDEL BAZEN is a 81 y.o. male with medical history significant of PSVT s/p ablation 2018, frequent PVCs, ectopic atrioventricular node tachycardia, paroxysmal atrial fibrillation status post ablation 2022 on warfarin and amiodarone, followed by EP at [facility], paroxysmal atrial flutter, coronary artery disease s/p CABG 1992 with stents in 2012, , HTN, CKD 3, BPH, gout, CAD,  who presents to the ED with acute left hip pain sustained after he fell onto his left hip after tripping over a rug.  S/P THA.    PT Comments    Patient received in recliner, OT present in room as well as wife at bedside. Patient given robaxin prior to session. He reports a LOT of pain with any movement of L LE. Patient requires mod +2 for sit to stand from recliner, max cues for hand placement and weight bearing. He has a great deal of difficulty moving either LE in standing. Cues needed to straighten posture and extend arms to assist with mobility. Patient unable to effectively do so. Patient ultimately was able to take a few shuffle steps with heavy +2 assist. He will continue to benefit from skilled PT while here to improve functional mobility, strength and balance.       Recommendations for follow up therapy are one component of a multi-disciplinary discharge planning process, led by the attending physician.  Recommendations may be updated based on patient status, additional functional criteria and insurance authorization.  Follow Up Recommendations  Skilled nursing-short term rehab (<3 hours/day)     Assistance Recommended at Discharge Frequent or constant Supervision/Assistance  Patient can return home with the following A lot of help with bathing/dressing/bathroom;A lot of help with walking and/or transfers;Assist for transportation;Help with stairs or ramp for  entrance;Assistance with cooking/housework   Equipment Recommendations  None recommended by PT;Other (comment) (defer to next venue)    Recommendations for Other Services       Precautions / Restrictions Precautions Precautions: Posterior Hip Precaution Booklet Issued: Yes (comment) Restrictions Weight Bearing Restrictions: Yes LLE Weight Bearing: Weight bearing as tolerated     Mobility  Bed Mobility Overal bed mobility: Needs Assistance Bed Mobility: Sit to Supine     Supine to sit: Mod assist, +2 for physical assistance, HOB elevated Sit to supine: Total assist   General bed mobility comments: Required total assist for sit to supine this pm due to pain    Transfers Overall transfer level: Needs assistance Equipment used: Rolling walker (2 wheels) Transfers: Sit to/from Stand Sit to Stand: Mod assist, +2 physical assistance Stand pivot transfers: Max assist         General transfer comment: patient requires max assist for sit to stand and to pivot to [facility]    Ambulation/Gait Ambulation/Gait assistance: Mod assist, +2 physical assistance Gait Distance (Feet): 3 Feet Assistive device: Rolling walker (2 wheels) Gait Pattern/deviations: Step-to pattern, Decreased step length - right, Decreased step length - left, Decreased weight shift to right, Decreased weight shift to left, Decreased stride length, Trunk flexed Gait velocity: decr     General Gait Details: Patient required heavy mod +2 assist for taking steps from recliner back to bed. (side steps then turning steps). Max cues for sequencing, max cues for upright posture. Patient extremely pain limited this date. Leaning heavily to his left in standing.   Stairs  Wheelchair Mobility    Modified Rankin (Stroke Patients Only)       Balance Overall balance assessment: Needs assistance, History of Falls Sitting-balance support: Feet supported Sitting balance-Leahy Scale: Poor Sitting  balance - Comments: difficulty in sitting due to pain Postural control: Right lateral lean Standing balance support: Bilateral upper extremity supported, During functional activity, Reliant on assistive device for balance Standing balance-Leahy Scale: Poor Standing balance comment: Heavy mod +2 for standing. Left lateral leaning in standing. Difficulty maintaining upright posture.                            Cognition Arousal/Alertness: Awake/alert Behavior During Therapy: WFL for tasks assessed/performed Overall Cognitive Status: Within Functional Limits for tasks assessed                                          Exercises Total Joint Exercises Ankle Circles/Pumps: AROM, Both, 10 reps Quad Sets: AROM, Left, 5 reps    General Comments        Pertinent Vitals/Pain Pain Assessment Pain Assessment: Faces Faces Pain Scale: Hurts worst Pain Location: L hip Pain Descriptors / Indicators: Grimacing, Guarding, Moaning, Discomfort, Sore, Operative site guarding Pain Intervention(s): Limited activity within patient's tolerance, Monitored during session, Premedicated before session, Repositioned    Home Living                          Prior Function            PT Goals (current goals can now be found in the care plan section) Acute Rehab PT Goals Patient Stated Goal: to go to rehab PT Goal Formulation: With patient/family Time For Goal Achievement: 03/06/22 Potential to Achieve Goals: Good Progress towards PT goals: Not progressing toward goals - comment (pain limited this date)    Frequency    BID      PT Plan Current plan remains appropriate    Co-evaluation PT/OT/SLP Co-Evaluation/Treatment: Yes Reason for Co-Treatment: For patient/therapist safety;To address functional/ADL transfers PT goals addressed during session: Mobility/safety with mobility;Balance;Proper use of DME        AM-PAC PT "6 Clicks" Mobility   Outcome  Measure  Help needed turning from your back to your side while in a flat bed without using bedrails?: A Lot Help needed moving from lying on your back to sitting on the side of a flat bed without using bedrails?: A Lot Help needed moving to and from a bed to a chair (including a wheelchair)?: A Lot Help needed standing up from a chair using your arms (e.g., wheelchair or bedside chair)?: A Lot Help needed to walk in hospital room?: A Lot Help needed climbing 3-5 steps with a railing? : Total 6 Click Score: 11    End of Session Equipment Utilized During Treatment: Gait belt Activity Tolerance: Patient limited by pain Patient left: in bed;with call bell/phone within reach;with bed alarm set;with nursing/sitter in room;with family/visitor present Nurse Communication: Mobility status PT Visit Diagnosis: Unsteadiness on feet (R26.81);Other abnormalities of gait and mobility (R26.89);Muscle weakness (generalized) (M62.81);Repeated falls (R29.6);Difficulty in walking, not elsewhere classified (R26.2);Pain;History of falling (Z91.81) Pain - Right/Left: Left Pain - part of body: Hip     Time: 1400-1425 PT Time Calculation (min) (ACUTE ONLY): 25 min  Charges:  $Gait Training: 8-22 mins $Therapeutic Activity: 23-37 mins  Ilay Capshaw, PT, GCS 02/21/22,2:48 PM

## 2022-02-21 NOTE — Progress Notes (Signed)
Subjective:  POD #2 s/p left hip hemiarthroplasty.   Patient reports left hip pain as mild to moderate.  Patient states his hip pain is primarily associated with movement.  His wife is at the bedside.  Objective:   VITALS:   Vitals:   02/21/22 0732 02/21/22 1213 02/21/22 1331 02/21/22 1655  BP: 110/65 (!) 110/47 (!) 125/50 117/60  Pulse: 68 61 90 60  Resp: 16   18  Temp:    98.8 F (37.1 C)  TempSrc:      SpO2:  94%  96%  Weight:      Height:        PHYSICAL EXAM: Left lower extremity Neurovascular intact Sensation intact distally Intact pulses distally Dorsiflexion/Plantar flexion intact Aquacel dressing: scant sanguinous drainage No cellulitis present Compartment soft  LABS  Results for orders placed or performed during the hospital encounter of 02/14/22 (from the past 24 hour(s))  Protime-INR     Status: Abnormal   Collection Time: 02/21/22  5:04 AM  Result Value Ref Range   Prothrombin Time 16.3 (H) 11.4 - 15.2 seconds   INR 1.3 (H) 0.8 - 1.2  CBC with Differential/Platelet     Status: Abnormal   Collection Time: 02/21/22  5:04 AM  Result Value Ref Range   WBC 6.3 4.0 - 10.5 K/uL   RBC 2.79 (L) 4.22 - 5.81 MIL/uL   Hemoglobin 9.2 (L) 13.0 - 17.0 g/dL   HCT 27.1 (L) 39.0 - 52.0 %   MCV 97.1 80.0 - 100.0 fL   MCH 33.0 26.0 - 34.0 pg   MCHC 33.9 30.0 - 36.0 g/dL   RDW 12.4 11.5 - 15.5 %   Platelets 187 150 - 400 K/uL   nRBC 0.0 0.0 - 0.2 %   Neutrophils Relative % 75 %   Neutro Abs 4.8 1.7 - 7.7 K/uL   Lymphocytes Relative 8 %   Lymphs Abs 0.5 (L) 0.7 - 4.0 K/uL   Monocytes Relative 16 %   Monocytes Absolute 1.0 0.1 - 1.0 K/uL   Eosinophils Relative 1 %   Eosinophils Absolute 0.0 0.0 - 0.5 K/uL   Basophils Relative 0 %   Basophils Absolute 0.0 0.0 - 0.1 K/uL   Immature Granulocytes 0 %   Abs Immature Granulocytes 0.01 0.00 - 0.07 K/uL  Basic metabolic panel     Status: Abnormal   Collection Time: 02/21/22  5:04 AM  Result Value Ref Range   Sodium  133 (L) 135 - 145 mmol/L   Potassium 3.9 3.5 - 5.1 mmol/L   Chloride 95 (L) 98 - 111 mmol/L   CO2 27 22 - 32 mmol/L   Glucose, Bld 48 (L) 70 - 99 mg/dL   BUN 38 (H) 8 - 23 mg/dL   Creatinine, Ser 1.49 (H) 0.61 - 1.24 mg/dL   Calcium 8.0 (L) 8.9 - 10.3 mg/dL   GFR, Estimated 47 (L) >60 mL/min   Anion gap 11 5 - 15  Magnesium     Status: None   Collection Time: 02/21/22  5:04 AM  Result Value Ref Range   Magnesium 2.4 1.7 - 2.4 mg/dL    No results found.  Assessment/Plan: 2 Days Post-Op   Principal Problem:   Closed left hip fracture (HCC) Active Problems:   Coronary artery disease   Stage 3a chronic kidney disease (CKD) (HCC)   Essential (primary) hypertension   Warfarin anticoagulation   Paroxysmal atrial fibrillation (HCC)   BPH (benign prostatic hyperplasia)   Hyponatremia  Acute delirium   Dysphagia   Acute urinary retention   Patient is clinically stable.  His hemoglobin is 9.2.  He is restarted on his Coumadin which is being managed by the pharmacy here at [facility].  Patient will continue with physical therapy.  He is weightbearing as tolerated on left lower extremity with posterior hip precautions.  He is awaiting skilled nursing facility placement.      Thornton [place] , MD 02/21/2022, 5:38 PM

## 2022-02-21 NOTE — Progress Notes (Signed)
Physical Therapy Treatment Patient Details Name: Robert Rivas MRN: [id number] DOB: 1941/08/25 Today's Date: 02/21/2022   History of Present Illness Robert Rivas is a 81 y.o. male with medical history significant of PSVT s/p ablation 2018, frequent PVCs, ectopic atrioventricular node tachycardia, paroxysmal atrial fibrillation status post ablation 2022 on warfarin and amiodarone, followed by EP at [facility], paroxysmal atrial flutter, coronary artery disease s/p CABG 1992 with stents in 2012, , HTN, CKD 3, BPH, gout, CAD,  who presents to the ED with acute left hip pain sustained after he fell onto his left hip after tripping over a rug.  S/P THA.    PT Comments    Patient received in bed, he is waiting for pain medicine, but reports he needs to use the bathroom. Patient requires mod +2 assist for supine to sit. Poor sitting balance this session due to pain in left hip. Patient attempted ambulation, but was severely pain limited and only able to take a step of two. Patient then assist via stand pivot to [facility] then to recliner with +2 assist   He will continue to benefit from skilled PT while here to improve functional mobility, strength and safety.     Recommendations for follow up therapy are one component of a multi-disciplinary discharge planning process, led by the attending physician.  Recommendations may be updated based on patient status, additional functional criteria and insurance authorization.  Follow Up Recommendations  Skilled nursing-short term rehab (<3 hours/day)     Assistance Recommended at Discharge Frequent or constant Supervision/Assistance  Patient can return home with the following A lot of help with bathing/dressing/bathroom;A lot of help with walking and/or transfers;Assist for transportation;Help with stairs or ramp for entrance;Assistance with cooking/housework   Equipment Recommendations  Other (comment);None recommended by PT    Recommendations for Other Services        Precautions / Restrictions Precautions Precautions: Posterior Hip Precaution Booklet Issued: Yes (comment) Restrictions Weight Bearing Restrictions: Yes LLE Weight Bearing: Weight bearing as tolerated     Mobility  Bed Mobility Overal bed mobility: Needs Assistance Bed Mobility: Supine to Sit     Supine to sit: Mod assist, +2 for physical assistance, HOB elevated          Transfers Overall transfer level: Needs assistance Equipment used: Rolling walker (2 wheels), None Transfers: Sit to/from Stand, Bed to chair/wheelchair/BSC Sit to Stand: Mod assist, From elevated surface, +2 physical assistance Stand pivot transfers: Max assist         General transfer comment: patient requires max assist for sit to stand and to pivot to [facility]    Ambulation/Gait Ambulation/Gait assistance: Mod assist, +2 physical assistance Gait Distance (Feet): 2 Feet Assistive device: Rolling walker (2 wheels) Gait Pattern/deviations: Step-to pattern, Decreased step length - right, Decreased stance time - left, Decreased stride length, Trunk flexed Gait velocity: decr     General Gait Details: patient attempted to ambulate from bed to [facility] about 4 feet away. He was pain limited and having difficulty advancing R LE. Leaning to right in standing and unable to progress after 2 feet, therefore bed moved back under patient for safety.   Stairs             Wheelchair Mobility    Modified Rankin (Stroke Patients Only)       Balance Overall balance assessment: Needs assistance Sitting-balance support: Feet supported Sitting balance-Leahy Scale: Poor Sitting balance - Comments: leaning to his right in sitting. Difficulty achieving and maintaining upright sitting  balance. Postural control: Right lateral lean Standing balance support: Bilateral upper extremity supported, During functional activity, Reliant on assistive device for balance Standing balance-Leahy Scale: Poor Standing  balance comment: mod +2 for safety with standing, or max of 1 for static standing                            Cognition Arousal/Alertness: Awake/alert Behavior During Therapy: WFL for tasks assessed/performed Overall Cognitive Status: Within Functional Limits for tasks assessed                                          Exercises Total Joint Exercises Ankle Circles/Pumps: AROM, Both, 10 reps Quad Sets: AROM, Left, 5 reps    General Comments        Pertinent Vitals/Pain Pain Assessment Pain Assessment: Faces Faces Pain Scale: Hurts even more Pain Location: L hip Pain Descriptors / Indicators: Guarding, Grimacing, Discomfort, Sore, Operative site guarding Pain Intervention(s): Monitored during session, RN gave pain meds during session, Repositioned    Home Living                          Prior Function            PT Goals (current goals can now be found in the care plan section) Acute Rehab PT Goals Patient Stated Goal: to go to rehab PT Goal Formulation: With patient Time For Goal Achievement: 03/06/22 Potential to Achieve Goals: Good Progress towards PT goals: Progressing toward goals    Frequency    BID      PT Plan Current plan remains appropriate    Co-evaluation              AM-PAC PT "6 Clicks" Mobility   Outcome Measure  Help needed turning from your back to your side while in a flat bed without using bedrails?: A Lot Help needed moving from lying on your back to sitting on the side of a flat bed without using bedrails?: A Lot Help needed moving to and from a bed to a chair (including a wheelchair)?: A Lot Help needed standing up from a chair using your arms (e.g., wheelchair or bedside chair)?: A Lot Help needed to walk in hospital room?: Total Help needed climbing 3-5 steps with a railing? : Total 6 Click Score: 10    End of Session Equipment Utilized During Treatment: Gait belt Activity Tolerance:  Patient limited by pain Patient left: in chair;with call bell/phone within reach;with chair alarm set Nurse Communication: Mobility status;Patient requests pain meds PT Visit Diagnosis: Unsteadiness on feet (R26.81);Other abnormalities of gait and mobility (R26.89);Muscle weakness (generalized) (M62.81);Repeated falls (R29.6);Difficulty in walking, not elsewhere classified (R26.2);Pain Pain - Right/Left: Left Pain - part of body: Hip     Time: 1700-1749 PT Time Calculation (min) (ACUTE ONLY): 31 min  Charges:  $Therapeutic Activity: 23-37 mins                     [employer], PT, GCS 02/21/22,11:22 AM

## 2022-02-21 NOTE — Progress Notes (Incomplete)
CARDIOLOGY CONSULT NOTE               Patient ID: Robert Rivas MRN: [id number] DOB/AGE: 1941/01/16 81 y.o.  Admit date: 02/14/2022 Referring Physician *** Primary Physician *** Primary Cardiologist *** Reason for Consultation ***  HPI: ***  Review of systems complete and found to be negative unless listed above     Past Medical History:  Diagnosis Date   A-fib [facility])    Arthritis    lower back   Basal cell carcinoma 10/29/2020   Right post base of skull/neck   BPH (benign prostatic hyperplasia)    Cancer (HCC)    Skin Cancer   Chronic kidney disease    Chronic Kidney Disease   Coronary artery disease    Dysrhythmia    Atrial Fibrillation; Supraventricular Tachycardia   Gout    Hearing aid worn    has, does not wear   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   L upper back 5.0cm inf to base of neck, 5.0cm lat to spine   Hyperlipidemia    Hypertension    MI, old    Nocturia    Phlebitis    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   SCC IS R foreearm distal   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   SCC IS R forearm proximal   Squamous cell carcinoma of skin 03/26/2018   SCC IS R forearm   Squamous cell carcinoma of skin 01/01/2008   SCC IS R forearm   Wears dentures    partial    Past Surgical History:  Procedure Laterality Date   cardaic stents     CARDIAC CATHETERIZATION     PTCA with Stent Placement   CARDIOVERSION N/A 04/13/2021   Procedure: CARDIOVERSION;  Surgeon: Corey Skains, MD;  Location: [facility] ORS;  Service: Cardiovascular;  Laterality: N/A;   CARDIOVERSION N/A 05/05/2021   Procedure: CARDIOVERSION;  Surgeon: Corey Skains, MD;  Location: [facility] ORS;  Service: Cardiovascular;  Laterality: N/A;   COLONOSCOPY WITH PROPOFOL N/A 12/15/2015   Procedure: COLONOSCOPY WITH PROPOFOL;  Surgeon: Lollie Sails, MD;  Location: [facility] ENDOSCOPY;  Service: Endoscopy;  Laterality: N/A;   CORONARY ARTERY BYPASS GRAFT  1992   ECTROPION REPAIR Right  12/12/2017   Procedure: REPAIR OF ECTROPION SUTURES/EXTENSIVE RIGHT;  Surgeon: Karle Starch, MD;  Location: [facility];  Service: Ophthalmology;  Laterality: Right;   HERNIA REPAIR     SVT ABLATION  02/22/2017   [facility]    Medications Prior to Admission  Medication Sig Dispense Refill Last Dose   amiodarone (PACERONE) 200 MG tablet Take 200 mg by mouth daily.   02/14/2022   docusate sodium (COLACE) 100 MG capsule Take 100 mg by mouth in the morning.   02/14/2022   dronedarone (MULTAQ) 400 MG tablet Take 400 mg by mouth 2 (two) times daily with a meal.   02/14/2022   ezetimibe (ZETIA) 10 MG tablet Take 10 mg by mouth at bedtime.    02/13/2022   hydrALAZINE (APRESOLINE) 50 MG tablet Take 50 mg by mouth 2 (two) times daily.   02/14/2022   hydrochlorothiazide (HYDRODIURIL) 12.5 MG tablet Take 12.5 mg by mouth in the morning.   02/14/2022   metoprolol tartrate (LOPRESSOR) 25 MG tablet Take 25 mg by mouth 2 (two) times daily.   02/14/2022   montelukast (SINGULAIR) 10 MG tablet Take 10 mg by mouth at bedtime.   02/13/2022   olmesartan (BENICAR) 40 MG tablet Take 40  mg by mouth in the morning and at bedtime.   02/14/2022   solifenacin (VESICARE) 10 MG tablet Take 10 mg by mouth daily.   02/14/2022   cyanocobalamin (,VITAMIN B-12,) 1000 MCG/ML injection Inject 1,000 mcg into the muscle every 30 (thirty) days. (Patient not taking: Reported on 02/15/2022)   Not Taking   nitroGLYCERIN (NITROSTAT) 0.4 MG SL tablet Place 0.4 mg under the tongue every 5 (five) minutes x 3 doses as needed for chest pain.   prn at prn   simvastatin (ZOCOR) 20 MG tablet Take 20 mg by mouth at bedtime.   02/13/2022   SYRINGE-NEEDLE, DISP, 3 ML 25G X 1" 3 ML MISC Use 1 Syringe monthly.      tamsulosin (FLOMAX) 0.4 MG CAPS capsule Take 0.4 mg by mouth at bedtime.   02/13/2022   warfarin (COUMADIN) 2 MG tablet Take 4 mg by mouth at bedtime.   02/13/2022 at 2030   Social History   Socioeconomic History   Marital status: Married     Spouse name: Not on file   Number of children: Not on file   Years of education: Not on file   Highest education level: Not on file  Occupational History   Not on file  Tobacco Use   Smoking status: Never   Smokeless tobacco: Never  Vaping Use   Vaping Use: Never used  Substance and Sexual Activity   Alcohol use: No   Drug use: No   Sexual activity: Not on file  Other Topics Concern   Not on file  Social History Narrative   Not on file   Social Determinants of Health   Financial Resource Strain: Not on file  Food Insecurity: Not on file  Transportation Needs: Not on file  Physical Activity: Not on file  Stress: Not on file  Social Connections: Not on file  Intimate Partner Violence: Not on file    Family History  Problem Relation Age of Onset   Kidney cancer Maternal Uncle    Prostate cancer Neg Hx    Bladder Cancer Neg Hx       Review of systems complete and found to be negative unless listed above      PHYSICAL EXAM  General: Well developed, well nourished, in no acute distress HEENT:  Normocephalic and atramatic Neck:  No JVD.  Lungs: Clear bilaterally to auscultation and percussion. Heart: HRRR . Normal S1 and S2 without gallops or murmurs.  Abdomen: Bowel sounds are positive, abdomen soft and non-tender  Msk:  Back normal, normal gait. Normal strength and tone for age. Extremities: No clubbing, cyanosis or edema.   Neuro: Alert and oriented X 3. Psych:  Good affect, responds appropriately  Labs:   Lab Results  Component Value Date   WBC 7.5 02/20/2022   HGB 9.9 (L) 02/20/2022   HCT 28.4 (L) 02/20/2022   MCV 95.6 02/20/2022   PLT 176 02/20/2022    Recent Labs  Lab 02/17/22 0953 02/17/22 1813 02/20/22 0432  NA 127*   < > 131*  K 3.9   < > 4.2  CL 90*   < > 96*  CO2 28   < > 27  BUN 18   < > 35*  CREATININE 1.02   < > 1.31*  CALCIUM 8.4*   < > 8.3*  PROT 6.0*  --   --   BILITOT 1.2  --   --   ALKPHOS 35*  --   --   ALT 23  --   --  AST 22  --   --   GLUCOSE 98   < > 145*   < > = values in this interval not displayed.   Lab Results  Component Value Date   CKTOTAL 168 04/11/2014   CKMB 2.2 04/11/2014   TROPONINI < 0.02 04/12/2014   No results found for: CHOL No results found for: HDL No results found for: LDLCALC No results found for: TRIG No results found for: CHOLHDL No results found for: LDLDIRECT    Radiology: DG Chest 1 View  Result Date: 02/14/2022 CLINICAL DATA:  Recent trip and fall with chest pain, initial encounter EXAM: PORTABLE CHEST 1 VIEW COMPARISON:  04/12/2020 FINDINGS: Cardiac shadow is enlarged but stable. Postsurgical changes are again noted. The lungs are well aerated bilaterally. No focal infiltrate or effusion is seen. No acute bony abnormality is noted. IMPRESSION: No active disease. Electronically Signed   By: Inez Catalina M.D.   On: 02/14/2022 20:16   CT Head Wo Contrast  Result Date: 02/14/2022 CLINICAL DATA:  Recent trip and fall with headaches, initial encounter EXAM: CT HEAD WITHOUT CONTRAST TECHNIQUE: Contiguous axial images were obtained from the base of the skull through the vertex without intravenous contrast. RADIATION DOSE REDUCTION: This exam was performed according to the departmental dose-optimization program which includes automated exposure control, adjustment of the mA and/or kV according to patient size and/or use of iterative reconstruction technique. COMPARISON:  12/31/2021 FINDINGS: Brain: No evidence of acute infarction, hemorrhage, hydrocephalus, extra-axial collection or mass lesion/mass effect. Stable cystic change is noted in the left posterior fossa stable in appearance from the prior exam. Scattered chronic white matter ischemic changes are noted. Vascular: No hyperdense vessel or unexpected calcification. Skull: Normal. Negative for fracture or focal lesion. Sinuses/Orbits: No acute finding. Other: None. IMPRESSION: Chronic changes without acute abnormality.  Electronically Signed   By: Inez Catalina M.D.   On: 02/14/2022 19:59   CT CHEST W CONTRAST  Result Date: 02/17/2022 CLINICAL DATA:  Hyponatremia.  Evaluate for lung mass. EXAM: CT CHEST WITH CONTRAST TECHNIQUE: Multidetector CT imaging of the chest was performed during intravenous contrast administration. RADIATION DOSE REDUCTION: This exam was performed according to the departmental dose-optimization program which includes automated exposure control, adjustment of the mA and/or kV according to patient size and/or use of iterative reconstruction technique. CONTRAST:  65mL OMNIPAQUE IOHEXOL 300 MG/ML  SOLN COMPARISON:  Chest radiograph 02/14/2022 and chest CT 05/17/2016 FINDINGS: Cardiovascular: Enlargement of the aortic root at the sinuses of Valsalva measuring up to 4.6 cm. Ascending thoracic aorta measures 4.5 cm and measured 4.4 cm in 2017. Post CABG changes. Left circumflex bypass graft is stented. Heart is enlarged, particularly the right atrium. No significant pericardial effusion. Main pulmonary artery measures 3.7 cm. No large filling defects in the main or central pulmonary arteries. Typical three-vessel arch anatomy. The great vessels are patent. Mid descending thoracic aorta measures 2.6 cm with atherosclerotic calcifications. At least mild stenosis near the origin of the celiac trunk. Mediastinum/Nodes: Small hiatal hernia. No mediastinal, hilar or axillary lymph node enlargement. Lungs/Pleura: Mild scarring at the lung apices. New focal pleural thickening or nodularity along the posterior right upper lobe on sequence 3, image 31. This area measures up to 8 mm. Patchy pleural-based densities in the posterior right lower lobe are most compatible with atelectasis or scarring. Elongated nodular structure in the posterior left lung on sequence 2 image 55 measures roughly 5 mm and this appears to be new. Stable 4 mm nodule in the  left upper lobe on sequence 3, image 35. Slightly increased posterior  pleural thickening bilaterally. Question trace left pleural fluid. Upper Abdomen: Large fluid attenuating structure near the left hepatic dome is suggestive for a large cyst that measures up to 5.8 cm and previously measured 3.6 cm. Additional small hepatic low-density structures are suggestive for additional cysts. 3 mm stone in the right kidney upper pole. Musculoskeletal: Compression fracture along the superior endplate of I62 is new since the prior imaging. This fracture is age indeterminate. The other thoracic vertebral body heights are maintained. IMPRESSION: 1. Increased pleural thickening since 2017. New focal pleural nodularity in the posterior right upper chest measures 8 mm and indeterminate. This could represent scarring but recommend follow-up to ensure stability. Additional small nodular structures as described. Non-contrast chest CT at 3-6 months is recommended. If the nodules are stable at time of repeat CT, then future CT at 18-24 months (from today's scan) is considered optional for low-risk patients, but is recommended for high-risk patients. This recommendation follows the consensus statement: Guidelines for Management of Incidental Pulmonary Nodules Detected on CT Images: From the Fleischner Society 2017; Radiology 2017; 284:228-243. 2. 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 Aortic root measures 4.6 cm. Ascending thoracic aortic aneurysm. Recommend semi-annual imaging followup by CTA or MRA and referral to cardiothoracic surgery if not already obtained. This recommendation follows 2010 ACCF/AHA/AATS/ACR/ASA/SCA/SCAI/SIR/STS/SVM Guidelines for the Diagnosis and Management of Patients With Thoracic Aortic Disease. Circulation. 2010; 121: V035-K093. Aortic aneurysm NOS (ICD10-I71.9) 3. Post CABG changes with cardiomegaly. 4. Hepatic cysts. 5. Small right renal calculus. 6. Age-indeterminate T12 vertebral body compression fracture.  Electronically Signed   By: Markus Daft M.D.   On: 02/17/2022 10:49   DG HIP UNILAT WITH PELVIS 1V LEFT  Result Date: 02/19/2022 CLINICAL DATA:  Left hip surgery EXAM: DG HIP (WITH OR WITHOUT PELVIS) 1V*L* COMPARISON:  02/14/2022 FINDINGS: Interval postsurgical changes from left hip hemiarthroplasty. Arthroplasty components are in their expected alignment. No periprosthetic fracture or evidence of other complication. Expected postoperative changes within the overlying soft tissues. IMPRESSION: Negative. Electronically Signed   By: Davina Poke D.O.   On: 02/19/2022 13:29   DG HIP UNILAT WITH PELVIS 2-3 VIEWS LEFT  Result Date: 02/14/2022 CLINICAL DATA:  Recent fall with left hip pain, initial encounter EXAM: DG HIP (WITH OR WITHOUT PELVIS) 3V LEFT COMPARISON:  None. FINDINGS: Pelvic ring is intact. Left subcapital femoral neck fracture is noted with impaction and angulation at the fracture site. No other fractures are seen. Postsurgical changes in the left lower quadrant are noted. IMPRESSION: Left subcapital femoral neck fracture is noted. Electronically Signed   By: Inez Catalina M.D.   On: 02/14/2022 20:15    EKG: ***  ASSESSMENT AND PLAN:  ***  Signed: Yolonda Kida MD 02/21/2022, 12:03 AM

## 2022-02-22 LAB — BASIC METABOLIC PANEL
Anion gap: 7 (ref 5–15)
BUN: 42 mg/dL — ABNORMAL HIGH (ref 8–23)
CO2: 30 mmol/L (ref 22–32)
Calcium: 8.2 mg/dL — ABNORMAL LOW (ref 8.9–10.3)
Chloride: 100 mmol/L (ref 98–111)
Creatinine, Ser: 1.42 mg/dL — ABNORMAL HIGH (ref 0.61–1.24)
GFR, Estimated: 49 mL/min — ABNORMAL LOW (ref >60)
Glucose, Bld: 103 mg/dL — ABNORMAL HIGH (ref 70–99)
Potassium: 4 mmol/L (ref 3.5–5.1)
Sodium: 137 mmol/L (ref 135–145)

## 2022-02-22 LAB — PROTIME-INR
INR: 1.5 — ABNORMAL HIGH (ref 0.8–1.2)
Prothrombin Time: 17.6 seconds — ABNORMAL HIGH (ref 11.4–15.2)

## 2022-02-22 LAB — SURGICAL PATHOLOGY

## 2022-02-22 MED ORDER — LEVOTHYROXINE SODIUM 50 MCG PO TABS
50.0000 ug | ORAL_TABLET | Freq: Every day | ORAL | Status: DC
  Administered 2022-02-22: 50 ug via ORAL
  Filled 2022-02-22: qty 1

## 2022-02-22 MED ORDER — QUETIAPINE FUMARATE 25 MG PO TABS: 25.0000 mg | ORAL_TABLET | Freq: Every day | ORAL | 0 refills | Status: DC

## 2022-02-22 MED ORDER — OXYCODONE HCL 5 MG PO TABS: 5.0000 mg | ORAL_TABLET | ORAL | 0 refills | Status: DC | PRN

## 2022-02-22 MED ORDER — WARFARIN SODIUM 10 MG PO TABS
10.0000 mg | ORAL_TABLET | Freq: Once | ORAL | Status: DC
  Filled 2022-02-22: qty 1

## 2022-02-22 MED ORDER — LEVOTHYROXINE SODIUM 50 MCG PO TABS: 50.0000 ug | ORAL_TABLET | Freq: Every day | ORAL | Status: DC

## 2022-02-22 MED ORDER — SODIUM CHLORIDE 1 G PO TABS
1.0000 g | ORAL_TABLET | Freq: Two times a day (BID) | ORAL | Status: DC
Start: 2022-02-22 — End: 2023-06-01

## 2022-02-22 MED ORDER — WARFARIN SODIUM 10 MG PO TABS: 10.0000 mg | ORAL_TABLET | Freq: Once | ORAL | Status: DC

## 2022-02-22 NOTE — Discharge Summary (Signed)
Physician Discharge Summary   [name] MRN: [id number] DOB: 03/27/1941  Admit date:     02/14/2022  Discharge date: 02/22/22  Discharge Physician: Sharen Hones   PCP: Idelle Crouch, MD   Recommendations at discharge:    Follow-up with the orthopedic surgery in 2 weeks. Diet dysphagia 3 with filtration less than 1500 mL. Repeat BMP in 1 week, may discontinue salt tablets if sodium level continues to be normal. Please follow warfarin protocol to dose warfarin.  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  Patient presents after a mechanical fall.  INR 2.5 on 02/15/2022.  Small doses of vitamin K given.  INR will have to be under 1.5 in order to go for hemiarthroplasty. Patient developed acute hyponatremia, received tolvaptan.  Patient also developed significant delirium, was given Haldol and Seroquel. Left hip hemiarthroplasty performed on 2/25  Assessment and Plan: * Closed left hip fracture ([place])- (present on admission) Patient is postop  Surgery,  doing well.  Medically stable to be discharged, continue warfarin.  Acute urinary retention Acute urine retention secondary to benign prostate hypertrophy. Condition has improved, Foley cath removed yesterday, patient was able to urinate  Dysphagia Patient is evaluated by speech therapy,  does not have a high risk for aspiration. We will continue dysphagia  3 diet after discharge.  Acute delirium Patient had a severe agitation and confusion the second day after admission.  Received multiple Haldol doses.  He also received a low-dose of Seroquel 25 mg every evening.  Condition has improved.  Continue Seroquel for 1 week and discontinue afterwards.  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  Patient had a severe hyponatremia after arriving the hospital, required tolvaptan.  He was also given salt tablets and fluid restriction 1524ml/day.   BPH (benign prostatic hyperplasia) Continue Flomax.  Paroxysmal atrial fibrillation ([place])- (present on admission) Continue beta-blocker, warfarin resumed. Patient will receive 10 mg of warfarin today, then resume home dose.  Warfarin anticoagulation Resume warfarin.  Essential (primary) hypertension- (present on admission) Resume all home treatment except discontinue hydrochlorothiazide for hyponatremia.  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  Lasix will be discontinued at time of discharge.  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     Atrial fibrillation [facility])             Consultants: Orthopedics Procedures performed: Hip  Disposition: Skilled nursing facility Diet recommendation:  Discharge Diet Orders (From admission, onward)     Start     Ordered   02/22/22 0000  Diet general       Comments: Dys 3 diet with fluid restriction 159ml/day   02/22/22 0957           Dysphagia type 3 thin Liquid Fluid restriction DISCHARGE MEDICATION: Allergies as of 02/22/2022       Reactions   Codeine Nausea And Vomiting, Nausea Only   Tizanidine Other (See  Comments)   drowsiness        Medication List     STOP taking these medications    cyanocobalamin 1000 MCG/ML injection Commonly known as: (VITAMIN B-12)   dronedarone 400 MG tablet Commonly known as: MULTAQ    hydrochlorothiazide 12.5 MG tablet Commonly known as: HYDRODIURIL       TAKE these medications    amiodarone 200 MG tablet Commonly known as: PACERONE Take 200 mg by mouth daily.   docusate sodium 100 MG capsule Commonly known as: COLACE Take 100 mg by mouth in the morning.   ezetimibe 10 MG tablet Commonly known as: ZETIA Take 10 mg by mouth at bedtime.   hydrALAZINE 50 MG tablet Commonly known as: APRESOLINE Take 50 mg by mouth 2 (two) times daily.   levothyroxine 50 MCG tablet Commonly known as: SYNTHROID Take 1 tablet (50 mcg total) by mouth daily at 6 (six) AM. Start taking on: February 23, 2022   metoprolol tartrate 25 MG tablet Commonly known as: LOPRESSOR Take 25 mg by mouth 2 (two) times daily.   montelukast 10 MG tablet Commonly known as: SINGULAIR Take 10 mg by mouth at bedtime.   nitroGLYCERIN 0.4 MG SL tablet Commonly known as: NITROSTAT Place 0.4 mg under the tongue every 5 (five) minutes x 3 doses as needed for chest pain.   olmesartan 40 MG tablet Commonly known as: BENICAR Take 40 mg by mouth in the morning and at bedtime.   oxyCODONE 5 MG immediate release tablet Commonly known as: Oxy IR/ROXICODONE Take 1-2 tablets (5-10 mg total) by mouth every 4 (four) hours as needed for moderate pain (pain score 4-6).   QUEtiapine 25 MG tablet Commonly known as: SEROQUEL Take 1 tablet (25 mg total) by mouth at bedtime for 7 days.   simvastatin 20 MG tablet Commonly known as: ZOCOR Take 20 mg by mouth at bedtime.   sodium chloride 1 g tablet Take 1 tablet (1 g total) by mouth 2 (two) times daily with a meal.   solifenacin 10 MG tablet Commonly known as: VESICARE Take 10 mg by mouth daily.   SYRINGE-NEEDLE (DISP) 3 ML 25G X 1" 3 ML Misc Use 1 Syringe monthly.   tamsulosin 0.4 MG Caps capsule Commonly known as: FLOMAX Take 0.4 mg by mouth at bedtime.   warfarin 2 MG tablet Commonly known as: COUMADIN Take 4 mg by mouth at bedtime.                Discharge Care Instructions  (From admission, onward)           Start     Ordered   02/22/22 0000  Discharge wound care:       Comments: Follow-up with orthopedics in 2 weeks.   02/22/22 6301            Contact information for follow-up providers     Thornton Park, MD. Go on 03/09/2022.   Specialty: Orthopedic Surgery Why: Appt @ 3:45 pm Contact information: [place] [address] [phone]              Contact information for after-discharge care     Destination     [facility] Preferred SNF .   Service: Skilled Nursing Contact information: [address] [place] [phone]                     Discharge Exam: Danley Danker Weights   02/14/22 1932 02/14/22  2200  Weight: 95.3 kg 98.2 kg   General exam: Appears calm and comfortable  Respiratory system: Clear to auscultation. Respiratory effort normal. Cardiovascular system: S1 & S2 heard, RRR. No JVD, murmurs, rubs, gallops or clicks. No pedal edema. Gastrointestinal system: Abdomen is nondistended, soft and nontender. No organomegaly or masses felt. Normal bowel sounds heard. Central nervous system: Alert and oriented. No focal neurological deficits. Extremities: Symmetric 5 x 5 power. Skin: No rashes, lesions or ulcers Psychiatry: Judgement and insight appear normal. Mood & affect appropriate.    Condition at discharge: good  The results of significant diagnostics from this hospitalization (including imaging, microbiology, ancillary and laboratory) are listed below for reference.   Imaging Studies: DG Chest 1 View  Result Date: 02/14/2022 CLINICAL DATA:  Recent trip and fall with chest pain, initial encounter EXAM: PORTABLE CHEST 1 VIEW COMPARISON:  04/12/2020 FINDINGS: Cardiac shadow is enlarged but stable. Postsurgical changes are again noted. The lungs are well aerated bilaterally. No focal infiltrate or effusion  is seen. No acute bony abnormality is noted. IMPRESSION: No active disease. Electronically Signed   By: Inez Catalina M.D.   On: 02/14/2022 20:16   CT Head Wo Contrast  Result Date: 02/14/2022 CLINICAL DATA:  Recent trip and fall with headaches, initial encounter EXAM: CT HEAD WITHOUT CONTRAST TECHNIQUE: Contiguous axial images were obtained from the base of the skull through the vertex without intravenous contrast. RADIATION DOSE REDUCTION: This exam was performed according to the departmental dose-optimization program which includes automated exposure control, adjustment of the mA and/or kV according to patient size and/or use of iterative reconstruction technique. COMPARISON:  12/31/2021 FINDINGS: Brain: No evidence of acute infarction, hemorrhage, hydrocephalus, extra-axial collection or mass lesion/mass effect. Stable cystic change is noted in the left posterior fossa stable in appearance from the prior exam. Scattered chronic white matter ischemic changes are noted. Vascular: No hyperdense vessel or unexpected calcification. Skull: Normal. Negative for fracture or focal lesion. Sinuses/Orbits: No acute finding. Other: None. IMPRESSION: Chronic changes without acute abnormality. Electronically Signed   By: Inez Catalina M.D.   On: 02/14/2022 19:59   CT CHEST W CONTRAST  Result Date: 02/17/2022 CLINICAL DATA:  Hyponatremia.  Evaluate for lung mass. EXAM: CT CHEST WITH CONTRAST TECHNIQUE: Multidetector CT imaging of the chest was performed during intravenous contrast administration. RADIATION DOSE REDUCTION: This exam was performed according to the departmental dose-optimization program which includes automated exposure control, adjustment of the mA and/or kV according to patient size and/or use of iterative reconstruction technique. CONTRAST:  75mL OMNIPAQUE IOHEXOL 300 MG/ML  SOLN COMPARISON:  Chest radiograph 02/14/2022 and chest CT 05/17/2016 FINDINGS: Cardiovascular: Enlargement of the aortic root at  the sinuses of Valsalva measuring up to 4.6 cm. Ascending thoracic aorta measures 4.5 cm and measured 4.4 cm in 2017. Post CABG changes. Left circumflex bypass graft is stented. Heart is enlarged, particularly the right atrium. No significant pericardial effusion. Main pulmonary artery measures 3.7 cm. No large filling defects in the main or central pulmonary arteries. Typical three-vessel arch anatomy. The great vessels are patent. Mid descending thoracic aorta measures 2.6 cm with atherosclerotic calcifications. At least mild stenosis near the origin of the celiac trunk. Mediastinum/Nodes: Small hiatal hernia. No mediastinal, hilar or axillary lymph node enlargement. Lungs/Pleura: Mild scarring at the lung apices. New focal pleural thickening or nodularity along the posterior right upper lobe on sequence 3, image 31. This area measures up to 8 mm. Patchy pleural-based densities in the posterior right lower lobe are  most compatible with atelectasis or scarring. Elongated nodular structure in the posterior left lung on sequence 2 image 55 measures roughly 5 mm and this appears to be new. Stable 4 mm nodule in the left upper lobe on sequence 3, image 35. Slightly increased posterior pleural thickening bilaterally. Question trace left pleural fluid. Upper Abdomen: Large fluid attenuating structure near the left hepatic dome is suggestive for a large cyst that measures up to 5.8 cm and previously measured 3.6 cm. Additional small hepatic low-density structures are suggestive for additional cysts. 3 mm stone in the right kidney upper pole. Musculoskeletal: Compression fracture along the superior endplate of Q33 is new since the prior imaging. This fracture is age indeterminate. The other thoracic vertebral body heights are maintained. IMPRESSION: 1. Increased pleural thickening since 2017. New focal pleural nodularity in the posterior right upper chest measures 8 mm and indeterminate. This could represent scarring but  recommend follow-up to ensure stability. Additional small nodular structures as described. Non-contrast chest CT at 3-6 months is recommended. If the nodules are stable at time of repeat CT, then future CT at 18-24 months (from today's scan) is considered optional for low-risk patients, but is recommended for high-risk patients. This recommendation follows the consensus statement: Guidelines for Management of Incidental Pulmonary Nodules Detected on CT Images: From the Fleischner Society 2017; Radiology 2017; 284:228-243. 2. 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 Aortic root measures 4.6 cm. Ascending thoracic aortic aneurysm. Recommend semi-annual imaging followup by CTA or MRA and referral to cardiothoracic surgery if not already obtained. This recommendation follows 2010 ACCF/AHA/AATS/ACR/ASA/SCA/SCAI/SIR/STS/SVM Guidelines for the Diagnosis and Management of Patients With Thoracic Aortic Disease. Circulation. 2010; 121: A076-A263. Aortic aneurysm NOS (ICD10-I71.9) 3. Post CABG changes with cardiomegaly. 4. Hepatic cysts. 5. Small right renal calculus. 6. Age-indeterminate T12 vertebral body compression fracture. Electronically Signed   By: Markus Daft M.D.   On: 02/17/2022 10:49   DG HIP UNILAT WITH PELVIS 1V LEFT  Result Date: 02/19/2022 CLINICAL DATA:  Left hip surgery EXAM: DG HIP (WITH OR WITHOUT PELVIS) 1V*L* COMPARISON:  02/14/2022 FINDINGS: Interval postsurgical changes from left hip hemiarthroplasty. Arthroplasty components are in their expected alignment. No periprosthetic fracture or evidence of other complication. Expected postoperative changes within the overlying soft tissues. IMPRESSION: Negative. Electronically Signed   By: Davina Poke D.O.   On: 02/19/2022 13:29   DG HIP UNILAT WITH PELVIS 2-3 VIEWS LEFT  Result Date: 02/14/2022 CLINICAL DATA:  Recent fall with left hip pain, initial encounter EXAM: DG HIP (WITH OR WITHOUT  PELVIS) 3V LEFT COMPARISON:  None. FINDINGS: Pelvic ring is intact. Left subcapital femoral neck fracture is noted with impaction and angulation at the fracture site. No other fractures are seen. Postsurgical changes in the left lower quadrant are noted. IMPRESSION: Left subcapital femoral neck fracture is noted. Electronically Signed   By: Inez Catalina M.D.   On: 02/14/2022 20:15    Microbiology: Results for orders placed or performed during the hospital encounter of 02/14/22  Resp Panel by RT-PCR (Flu A&B, Covid) Nasopharyngeal Swab     Status: None   Collection Time: 02/14/22  7:40 PM   Specimen: Nasopharyngeal Swab; Nasopharyngeal(NP) swabs in vial transport medium  Result Value Ref Range Status   SARS Coronavirus 2 by RT PCR NEGATIVE NEGATIVE Final    Comment: (NOTE) SARS-CoV-2 target nucleic acids are NOT DETECTED.  The SARS-CoV-2 RNA is generally detectable in upper respiratory specimens during the acute  phase of infection. The lowest concentration of SARS-CoV-2 viral copies this assay can detect is 138 copies/mL. A negative result does not preclude SARS-Cov-2 infection and should not be used as the sole basis for treatment or other patient management decisions. A negative result may occur with  improper specimen collection/handling, submission of specimen other than nasopharyngeal swab, presence of viral mutation(s) within the areas targeted by this assay, and inadequate number of viral copies(<138 copies/mL). A negative result must be combined with clinical observations, patient history, and epidemiological information. The expected result is Negative.  Fact Sheet for Patients:  EntrepreneurPulse.com.au  Fact Sheet for Healthcare Providers:  IncredibleEmployment.be  This test is no t yet approved or cleared by the [place] FDA and  has been authorized for detection and/or diagnosis of SARS-CoV-2 by FDA under an Emergency Use  Authorization (EUA). This EUA will remain  in effect (meaning this test can be used) for the duration of the COVID-19 declaration under Section 564(b)(1) of the Act, 21 U.S.C.section 360bbb-3(b)(1), unless the authorization is terminated  or revoked sooner.       Influenza A by PCR NEGATIVE NEGATIVE Final   Influenza B by PCR NEGATIVE NEGATIVE Final    Comment: (NOTE) The Xpert Xpress SARS-CoV-2/FLU/RSV plus assay is intended as an aid in the diagnosis of influenza from Nasopharyngeal swab specimens and should not be used as a sole basis for treatment. Nasal washings and aspirates are unacceptable for Xpert Xpress SARS-CoV-2/FLU/RSV testing.  Fact Sheet for Patients: EntrepreneurPulse.com.au  Fact Sheet for Healthcare Providers: IncredibleEmployment.be  This test is not yet approved or cleared by the [place] FDA and has been authorized for detection and/or diagnosis of SARS-CoV-2 by FDA under an Emergency Use Authorization (EUA). This EUA will remain in effect (meaning this test can be used) for the duration of the COVID-19 declaration under Section 564(b)(1) of the Act, 21 U.S.C. section 360bbb-3(b)(1), unless the authorization is terminated or revoked.  Performed at [facility], [address] [place], [place] [address]   Surgical PCR screen     Status: Abnormal   Collection Time: 02/14/22 10:51 PM   Specimen: Nasal Mucosa; Nasal Swab  Result Value Ref Range Status   MRSA, PCR POSITIVE (A) NEGATIVE Final    Comment: RESULT CALLED TO, READ BACK BY AND VERIFIED WITH: Luna Fuse [email or web address]  02/15/22 RH    Staphylococcus aureus POSITIVE (A) NEGATIVE Final    Comment: (NOTE) The Xpert SA Assay (FDA approved for NASAL specimens in patients 79 years of age and older), is one component of a comprehensive surveillance program. It is not intended to diagnose infection nor to guide or monitor treatment. Performed at [facility], [address] [place], [place] [address]     Labs: CBC: Recent Labs  Lab 02/16/22 0403 02/17/22 0308 02/20/22 0432 02/21/22 0504  WBC 6.5 6.9 7.5 6.3  NEUTROABS  --  5.1 6.4 4.8  HGB 11.9* 12.1* 9.9* 9.2*  HCT 34.6* 34.3* 28.4* 27.1*  MCV 96.9 95.5 95.6 97.1  PLT 173 168 176 812   Basic Metabolic Panel: Recent Labs  Lab 02/17/22 0308 02/17/22 0841 02/17/22 0953 02/17/22 1813 02/18/22 0227 02/19/22 0423 02/20/22 0432 02/21/22 0504 02/22/22 0600  NA 122*   < > 127*   < > 128* 133* 131* 133* 137  K 3.9  --  3.9  --   --  3.7 4.2 3.9 4.0  CL 88*  --  90*  --   --  94* 96* 95*  100  CO2 28  --  28  --   --  29 27 27 30   GLUCOSE 106*  --  98  --   --  110* 145* 103* 103*  BUN 20  --  18  --   --  47* 35* 38* 42*  CREATININE 1.08  --  1.02  --   --  1.84* 1.31* 1.49* 1.42*  CALCIUM 8.2*  --  8.4*  --   --  8.7* 8.3* 8.0* 8.2*  MG 2.0  --   --   --   --   --  2.4 2.4  --    < > = values in this interval not displayed.   Liver Function Tests: Recent Labs  Lab 02/17/22 0953  AST 22  ALT 23  ALKPHOS 35*  BILITOT 1.2  PROT 6.0*  ALBUMIN 3.4*   CBG: No results for input(s): GLUCAP in the last 168 hours.  Discharge time spent: greater than 30 minutes.  Signed: Sharen Hones, MD [facility] 02/22/2022

## 2022-02-22 NOTE — TOC Transition Note (Signed)
Transition of Care [facility]) - CM/SW Discharge Note   Patient Details  Name: Robert Rivas MRN: [id number] Date of Birth: 03-Feb-1941  [facility] CM/SW Contact:  Eileen Stanford, LCSW Phone Number: 02/22/2022, 10:56 AM   Clinical Narrative:  Clinical Social Worker facilitated patient discharge including contacting patient family and facility to confirm patient discharge plans.  Clinical information faxed to facility and family agreeable with plan.  CSW arranged ambulance transport via [facility] to [facility] room 804p. RN to call [phone] for report prior to discharge.    Final next level of care: [place] Barriers to Discharge: No Barriers Identified   Patient Goals and CMS Choice     Choice offered to / list presented to : Spouse Luz, Burcher (Spouse)   [phone] [facility]))  Discharge Placement                       Discharge Plan and Services In-house Referral: Clinical Social Work   Post Acute Care Choice: [place]                               Social Determinants of Health (SDOH) Interventions     Readmission Risk Interventions No flowsheet data found.

## 2022-02-22 NOTE — Progress Notes (Signed)
Contacted [facility]. Reported to [place], [place]

## 2022-02-22 NOTE — Progress Notes (Signed)
Occupational Therapy Treatment Patient Details Name: Robert Rivas MRN: [id number] DOB: 14-Dec-1941 Today's Date: 02/22/2022   History of present illness Robert Rivas is a 81 y.o. male with medical history significant of PSVT s/p ablation 2018, frequent PVCs, ectopic atrioventricular node tachycardia, paroxysmal atrial fibrillation status post ablation 2022 on warfarin and amiodarone, followed by EP at [facility], paroxysmal atrial flutter, coronary artery disease s/p CABG 1992 with stents in 2012, , HTN, CKD 3, BPH, gout, CAD,  who presents to the ED with acute left hip pain sustained after he fell onto his left hip after tripping over a rug.  S/P THA.   OT comments  Pt seen for OT/PT co-treatment on this date. Upon arrival to room, pt awake and seated upright in bed. Pain medication coordinated with RN prior to session however pt continuing to report 7/10 pain during mobility. This date, pt required MOD A for bed mobility and MOD-MAX A+2 for stand pivot transfer to/from [facility] d/t significant LLE pain, decreased balance, and decreased strength. Pt was unable to void when seated on BSC. Once seated EOB, EMS transportation arrived to pt's room and session concluded once pt was supine in bed. Pt is making good progress toward goals and continues to benefit from skilled OT services to maximize return to PLOF and minimize risk of future falls, injury, caregiver burden, and readmission. Will continue to follow POC. Discharge recommendation remains appropriate.     Recommendations for follow up therapy are one component of a multi-disciplinary discharge planning process, led by the attending physician.  Recommendations may be updated based on patient status, additional functional criteria and insurance authorization.    Follow Up Recommendations  Skilled nursing-short term rehab (<3 hours/day)    Assistance Recommended at Discharge Intermittent Supervision/Assistance  Patient can return home with the following   Two people to help with walking and/or transfers;Two people to help with bathing/dressing/bathroom;Assistance with cooking/housework   Equipment Recommendations  Other (comment) (defer to next venue of care)       Precautions / Restrictions Precautions Precautions: Posterior Hip Precaution Booklet Issued: Yes (comment) Restrictions Weight Bearing Restrictions: Yes LLE Weight Bearing: Weight bearing as tolerated       Mobility Bed Mobility Overal bed mobility: Needs Assistance Bed Mobility: Supine to Sit, Sit to Supine     Supine to sit: Mod assist, HOB elevated Sit to supine: Max assist, +2 for physical assistance   General bed mobility comments: Required total assist for sit to supine to pain    Transfers Overall transfer level: Needs assistance Equipment used: Rolling walker (2 wheels) Transfers: Sit to/from Stand, Bed to chair/wheelchair/BSC Sit to Stand: Mod assist, +2 physical assistance, From elevated surface     Step pivot transfers: Max assist, +2 physical assistance           Balance Overall balance assessment: Needs assistance Sitting-balance support: Feet supported, Bilateral upper extremity supported Sitting balance-Leahy Scale: Poor Sitting balance - Comments: difficulty in sitting due to pain Postural control: Right lateral lean Standing balance support: Bilateral upper extremity supported, During functional activity, Reliant on assistive device for balance Standing balance-Leahy Scale: Poor Standing balance comment: Heavy mod +2 for standing. Left lateral leaning/posterior leaning in standing. Difficulty maintaining upright posture.                           ADL either performed or assessed with clinical judgement   ADL Overall ADL's : Needs assistance/impaired  Toilet Transfer: Moderate assistance;Maximal assistance;+2 for physical assistance;Stand-pivot;BSC/3in1;Rolling walker (2 wheels) Toilet  Transfer Details (indicate cue type and reason): Requires MOD A+2 during transfer to [facility], MAX A+2 for transfer from BSC>bed                  Cognition Arousal/Alertness: Awake/alert Behavior During Therapy: WFL for tasks assessed/performed Overall Cognitive Status: Within Functional Limits for tasks assessed                                                     Pertinent Vitals/ Pain       Pain Assessment Pain Assessment: 0-10 Pain Score: 7  Pain Location: L hip Pain Descriptors / Indicators: Grimacing, Guarding, Moaning, Discomfort, Sore, Operative site guarding Pain Intervention(s): Monitored during session, Premedicated before session, Repositioned         Frequency  Min 2X/week        Progress Toward Goals  OT Goals(current goals can now be found in the care plan section)  Progress towards OT goals: Progressing toward goals  Acute Rehab OT Goals Patient Stated Goal: to resume going to the gym OT Goal Formulation: With patient Time For Goal Achievement: 03/06/22 Potential to Achieve Goals: Good  Plan Discharge plan remains appropriate;Frequency remains appropriate    Co-evaluation    PT/OT/SLP Co-Evaluation/Treatment: Yes Reason for Co-Treatment: For patient/therapist safety;To address functional/ADL transfers PT goals addressed during session: Mobility/safety with mobility;Balance;Proper use of DME OT goals addressed during session: ADL's and self-care      AM-PAC OT "6 Clicks" Daily Activity     Outcome Measure   Help from another person eating meals?: None Help from another person taking care of personal grooming?: A Little Help from another person toileting, which includes using toliet, bedpan, or urinal?: A Lot Help from another person bathing (including washing, rinsing, drying)?: A Lot Help from another person to put on and taking off regular upper body clothing?: A Little Help from another person to put on and taking off  regular lower body clothing?: A Lot 6 Click Score: 16    End of Session Equipment Utilized During Treatment: Gait belt;Rolling walker (2 wheels)  OT Visit Diagnosis: Unsteadiness on feet (R26.81);Muscle weakness (generalized) (M62.81);Pain;History of falling (Z91.81) Pain - Right/Left: Left Pain - part of body: Hip   Activity Tolerance Patient limited by pain   Patient Left in bed;with call bell/phone within reach;Other (comment) (with EMS transportaiton)   Nurse Communication Mobility status        Time: 1157-2620 OT Time Calculation (min): 31 min  Charges: OT General Charges $OT Visit: 1 Visit OT Treatments $Self Care/Home Management : 8-22 mins  Fredirick Maudlin, OTR/L [place]

## 2022-02-22 NOTE — Care Management Important Message (Signed)
Important Message  Patient Details  Name: Robert Rivas MRN: [id number] Date of Birth: 03-04-1941   Medicare Important Message Given:  Yes     Juliann Pulse A Phallon Haydu 02/22/2022, 10:53 AM

## 2022-02-22 NOTE — Progress Notes (Signed)
EMS transported pt from [facility] to [facility].  

## 2022-02-22 NOTE — Progress Notes (Signed)
Physical Therapy Treatment Patient Details Name: Robert Rivas MRN: [id number] DOB: November 04, 1941 Today's Date: 02/22/2022   History of Present Illness Robert Rivas is a 81 y.o. male with medical history significant of PSVT s/p ablation 2018, frequent PVCs, ectopic atrioventricular node tachycardia, paroxysmal atrial fibrillation status post ablation 2022 on warfarin and amiodarone, followed by EP at [facility], paroxysmal atrial flutter, coronary artery disease s/p CABG 1992 with stents in 2012, , HTN, CKD 3, BPH, gout, CAD,  who presents to the ED with acute left hip pain sustained after he fell onto his left hip after tripping over a rug.  S/P THA.    PT Comments    Patient received with OT present moving from supine to sit. Mod assist needed for this activity. Patient requires max cues for hand placement to maintain sitting balance. Mod +2 for sit to stand from elevated bed. Patient able to take very few shuffle/pivoting steps to [facility] with max cues, RW and mod +2 assist. Poor standing balance, leaning heavily to his left. Patient required mod/max +2 for sit to stand again from [facility]. Unable to achieve standing balance leaning posteriorly and unable to take any steps at this point. Therefore patient assisted back to bed with max +2 assist. Required max +2 assist to return to supine from sitting. Patient will continue to benefit from skilled PT while here to improve mobility.     Recommendations for follow up therapy are one component of a multi-disciplinary discharge planning process, led by the attending physician.  Recommendations may be updated based on patient status, additional functional criteria and insurance authorization.  Follow Up Recommendations  Skilled nursing-short term rehab (<3 hours/day)     Assistance Recommended at Discharge Frequent or constant Supervision/Assistance  Patient can return home with the following A lot of help with bathing/dressing/bathroom;A lot of help with walking  and/or transfers;Assist for transportation;Help with stairs or ramp for entrance;Assistance with cooking/housework   Equipment Recommendations  None recommended by PT;Other (comment)    Recommendations for Other Services       Precautions / Restrictions Precautions Precautions: Posterior Hip Precaution Booklet Issued: Yes (comment) Restrictions Weight Bearing Restrictions: Yes LLE Weight Bearing: Weight bearing as tolerated     Mobility  Bed Mobility Overal bed mobility: Needs Assistance Bed Mobility: Supine to Sit, Sit to Supine     Supine to sit: Mod assist, HOB elevated Sit to supine: Max assist, +2 for physical assistance   General bed mobility comments: Required total assist for sit to supine this pm due to pain    Transfers Overall transfer level: Needs assistance Equipment used: Rolling walker (2 wheels) Transfers: Sit to/from Stand, Bed to chair/wheelchair/BSC Sit to Stand: Mod assist, +2 physical assistance, From elevated surface Stand pivot transfers: Mod assist, +2 physical assistance, From elevated surface Step pivot transfers: Max assist, +2 physical assistance       General transfer comment: Mod +2 assist for sit to stand , poor sitting and standing balance. Very difficult for patient to take any steps once standing. Increased time needed. Max cues for use of AD, precautions, safety.    Ambulation/Gait Ambulation/Gait assistance: Max assist, +2 physical assistance, Mod assist Gait Distance (Feet): 3 Feet Assistive device: Rolling walker (2 wheels) Gait Pattern/deviations: Step-to pattern, Decreased step length - right, Decreased step length - left, Decreased weight shift to right, Decreased weight shift to left, Decreased stride length, Trunk flexed Gait velocity: decr     General Gait Details: Patient required heavy mod +2  assist for taking steps from bed>bsc>bed.  Max cues for sequencing, max cues for upright posture. Patient extremely pain limited  this date. Leaning heavily to his left and posteriorly in standing.   Stairs             Wheelchair Mobility    Modified Rankin (Stroke Patients Only)       Balance Overall balance assessment: Needs assistance Sitting-balance support: Feet supported, Bilateral upper extremity supported Sitting balance-Leahy Scale: Poor Sitting balance - Comments: difficulty in sitting due to pain   Standing balance support: Bilateral upper extremity supported, During functional activity, Reliant on assistive device for balance Standing balance-Leahy Scale: Poor Standing balance comment: Heavy mod +2 for standing. Left lateral leaning/posterior leaning in standing. Difficulty maintaining upright posture.                            Cognition Arousal/Alertness: Awake/alert Behavior During Therapy: WFL for tasks assessed/performed Overall Cognitive Status: Within Functional Limits for tasks assessed                                          Exercises      General Comments        Pertinent Vitals/Pain Pain Assessment Pain Assessment: 0-10 Pain Score: 7  Pain Location: L hip Pain Descriptors / Indicators: Grimacing, Guarding, Moaning, Discomfort, Sore, Operative site guarding, Other (Comment) (yelling out with mobility) Pain Intervention(s): Monitored during session, Repositioned, Premedicated before session    Home Living                          Prior Function            PT Goals (current goals can now be found in the care plan section) Acute Rehab PT Goals Patient Stated Goal: to go to rehab PT Goal Formulation: With patient/family Time For Goal Achievement: 03/06/22 Potential to Achieve Goals: Good Progress towards PT goals: Not progressing toward goals - comment (Very pain limited)    Frequency    BID      PT Plan Current plan remains appropriate    Co-evaluation PT/OT/SLP Co-Evaluation/Treatment: Yes Reason for  Co-Treatment: For patient/therapist safety;To address functional/ADL transfers PT goals addressed during session: Mobility/safety with mobility;Balance;Proper use of DME        AM-PAC PT "6 Clicks" Mobility   Outcome Measure  Help needed turning from your back to your side while in a flat bed without using bedrails?: Total Help needed moving from lying on your back to sitting on the side of a flat bed without using bedrails?: A Lot Help needed moving to and from a bed to a chair (including a wheelchair)?: A Lot Help needed standing up from a chair using your arms (e.g., wheelchair or bedside chair)?: A Lot Help needed to walk in hospital room?: Total Help needed climbing 3-5 steps with a railing? : Total 6 Click Score: 9    End of Session Equipment Utilized During Treatment: Gait belt Activity Tolerance: Patient limited by pain Patient left: in bed;Other (comment) (EMS had arrived to transport patient to rehab) Nurse Communication: Mobility status PT Visit Diagnosis: Other abnormalities of gait and mobility (R26.89);Unsteadiness on feet (R26.81);History of falling (Z91.81);Muscle weakness (generalized) (M62.81);Difficulty in walking, not elsewhere classified (R26.2);Pain Pain - Right/Left: Left Pain - part of body: Hip  Time: 1020-1046 PT Time Calculation (min) (ACUTE ONLY): 26 min  Charges:  $Gait Training: 8-22 mins                     Dominion Kathan, PT, GCS 02/22/22,10:59 AM

## 2022-05-06 ENCOUNTER — Other Ambulatory Visit: Payer: Self-pay | Admitting: Family Medicine

## 2022-05-06 DIAGNOSIS — M5136 Other intervertebral disc degeneration, lumbar region: Secondary | ICD-10-CM

## 2022-05-06 DIAGNOSIS — S32010D Wedge compression fracture of first lumbar vertebra, subsequent encounter for fracture with routine healing: Secondary | ICD-10-CM

## 2022-05-06 DIAGNOSIS — M51369 Other intervertebral disc degeneration, lumbar region without mention of lumbar back pain or lower extremity pain: Secondary | ICD-10-CM

## 2022-05-07 ENCOUNTER — Ambulatory Visit
Admission: RE | Admit: 2022-05-07 | Discharge: 2022-05-07 | Disposition: A | Discharge status: Home / Self Care | Payer: Medicare Other | Source: Ambulatory Visit | Attending: Family Medicine | Admitting: Family Medicine

## 2022-05-07 DIAGNOSIS — S32010D Wedge compression fracture of first lumbar vertebra, subsequent encounter for fracture with routine healing: Secondary | ICD-10-CM | POA: Diagnosis present

## 2022-05-07 DIAGNOSIS — M5136 Other intervertebral disc degeneration, lumbar region: Secondary | ICD-10-CM | POA: Insufficient documentation

## 2022-05-12 ENCOUNTER — Ambulatory Visit: Payer: Self-pay | Admitting: Urology

## 2022-05-19 ENCOUNTER — Other Ambulatory Visit: Payer: Self-pay | Admitting: Family Medicine

## 2022-05-19 DIAGNOSIS — S32020A Wedge compression fracture of second lumbar vertebra, initial encounter for closed fracture: Secondary | ICD-10-CM

## 2022-05-19 DIAGNOSIS — S32010A Wedge compression fracture of first lumbar vertebra, initial encounter for closed fracture: Secondary | ICD-10-CM

## 2022-05-26 ENCOUNTER — Ambulatory Visit
Admission: RE | Admit: 2022-05-26 | Discharge: 2022-05-26 | Disposition: A | Discharge status: Home / Self Care | Payer: Medicare Other | Source: Ambulatory Visit | Attending: Family Medicine | Admitting: Family Medicine

## 2022-05-26 ENCOUNTER — Other Ambulatory Visit: Payer: Medicare Other

## 2022-05-26 DIAGNOSIS — S32020A Wedge compression fracture of second lumbar vertebra, initial encounter for closed fracture: Secondary | ICD-10-CM

## 2022-05-26 NOTE — Consult Note (Signed)
Chief Complaint: Patient was seen in consultation today for L2 compression fracture at the request of Meeler,Whitney L  Referring Physician(s): Meeler,Whitney L  History of Present Illness: Robert Rivas is a 81 y.o. male who presents for evaluation of persistent lumbar spine pain in the setting of a subacute L2 compression fracture.  Robert Rivas was in his usual state of health until this past February when he fell in the garage and fractured his left hip.  His hip was surgically repaired and he did well in rehab and ultimately returned home.  Unfortunately, as he was beginning to ambulate again about the house he lost his balance while stepping over a small threshold and had another fall, this time onto his backside.  He began developing lumbar spine pain after this.  He has experienced issues with chronic back pain due to degenerative disc disease in the past and had previous successful epidural steroid injections.  He underwent a repeat epidural steroid injection but experienced no benefit.  Subsequently, a lumbar spine MRI was performed demonstrating a subacute L2 compression fracture with 55% vertebral body height loss.  He reports that his pain is quite severe rating an 8 or 9 out of 10 on a 10 point scale.  He is currently taking tramadol 3 times per day.  The tramadol helps manage his pain and takes the edge off.  However, he has been taking 3 tramadol daily now for weeks which she does not like.  Additionally, his pain is exacerbated by moving and changing positions.  His wife is with him today and she feels like it has gotten no better since his initial injury back in the middle of March.  He has issues with chronic atrial fibrillation and is currently on anticoagulation with Eliquis.  He recently saw his cardiologist.  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   L upper back 5.0cm inf to base of neck, 5.0cm lat to spine   Hyperlipidemia    Hypertension    MI, old    Nocturia    Phlebitis    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   SCC IS R foreearm distal   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   SCC IS R forearm proximal   Squamous cell carcinoma of skin 03/26/2018   SCC IS R forearm   Squamous cell carcinoma of skin 01/01/2008   SCC IS R forearm   Wears dentures    partial    Past Surgical History:  Procedure Laterality Date   cardaic stents     CARDIAC CATHETERIZATION     PTCA with Stent Placement   CARDIOVERSION N/A 04/13/2021   Procedure: CARDIOVERSION;  Surgeon: Corey Skains, MD;  Location: [facility] OR[facility];  Service: Cardiovascular;  Laterality: N/A;   CARDIOVERSION N/A 05/05/2021   Procedure: CARDIOVERSION;  Surgeon: Corey Skains, MD;  Location: [facility];  Service: Cardiovascular;  Laterality: N/A;   COLONOSCOPY WITH PROPOFOL N/A 12/15/2015  Procedure: COLONOSCOPY WITH PROPOFOL;  Surgeon: Lollie Sails, MD;  Location: [facility];  Service: Endoscopy;  Laterality: N/A;   CORONARY ARTERY BYPASS GRAFT  1992   ECTROPION REPAIR Right 12/12/2017   Procedure: REPAIR OF ECTROPION SUTURES/EXTENSIVE RIGHT;  Surgeon: Karle Starch, MD;  Location: [place];  Service: Ophthalmology;  Laterality: Right;   HERNIA REPAIR     HIP ARTHROPLASTY Left 02/19/2022   Procedure: ARTHROPLASTY BIPOLAR HIP (HEMIARTHROPLASTY);  Surgeon: Thornton Park, MD;  Location: [facility];  Service: Orthopedics;  Laterality: Left;   SVT ABLATION  02/22/2017   [facility]    Allergies: Codeine and Tizanidine  Medications: Prior to Admission  medications   Medication Sig Start Date End Date Taking? Authorizing Provider  apixaban (ELIQUIS) 2.5 MG TABS tablet Eliquis 2.5 mg tablet 03/14/22  Yes [provider]  olmesartan (BENICAR) 40 MG tablet Take 40 mg by mouth in the morning and at bedtime.   Yes [provider]  traMADol (ULTRAM) 50 MG tablet Take 50 mg by mouth 3 (three) times daily as needed. 05/06/22  Yes [provider]  amiodarone (PACERONE) 200 MG tablet Take 200 mg by mouth daily. Patient not taking: Reported on 05/26/2022 01/28/22   [provider]  docusate sodium (COLACE) 100 MG capsule Take 100 mg by mouth in the morning.    [provider]  ezetimibe (ZETIA) 10 MG tablet Take 10 mg by mouth at bedtime.     [provider]  hydrALAZINE (APRESOLINE) 50 MG tablet Take 50 mg by mouth 2 (two) times daily. 11/25/21   [provider]  levothyroxine (SYNTHROID) 50 MCG tablet Take 1 tablet (50 mcg total) by mouth daily at 6 (six) AM. 02/23/22   Sharen Hones, MD  metoprolol tartrate (LOPRESSOR) 25 MG tablet Take 25 mg by mouth 2 (two) times daily. Patient not taking: Reported on 05/26/2022    [provider]  montelukast (SINGULAIR) 10 MG tablet Take 10 mg by mouth at bedtime. 08/12/16   [provider]  nitroGLYCERIN (NITROSTAT) 0.4 MG SL tablet Place 0.4 mg under the tongue every 5 (five) minutes x 3 doses as needed for chest pain. 04/09/14   [provider]  oxyCODONE (OXY IR/ROXICODONE) 5 MG immediate release tablet Take 1-2 tablets (5-10 mg total) by mouth every 4 (four) hours as needed for moderate pain (pain score 4-6). Patient not taking: Reported on 05/26/2022 02/22/22   Sharen Hones, MD  QUEtiapine (SEROQUEL) 25 MG tablet Take 1 tablet (25 mg total) by mouth at bedtime for 7 days. 02/22/22 03/01/22  Sharen Hones, MD  simvastatin (ZOCOR) 20 MG tablet Take 20 mg by mouth at bedtime. 04/24/19   [provider]  sodium chloride 1 g tablet Take 1  tablet (1 g total) by mouth 2 (two) times daily with a meal. 02/22/22   Sharen Hones, MD  solifenacin (VESICARE) 10 MG tablet Take 10 mg by mouth daily. 05/03/21   [provider]  SYRINGE-NEEDLE, DISP, 3 ML 25G X 1" 3 ML MISC Use 1 Syringe monthly. 05/07/15   [provider]  tamsulosin (FLOMAX) 0.4 MG CAPS capsule Take 0.4 mg by mouth at bedtime. 01/28/21   [provider]  warfarin (COUMADIN) 2 MG tablet Take 4 mg by mouth at bedtime. Patient not taking: Reported on 05/26/2022 04/24/19   [provider]     Family History  Problem Relation Age of Onset   Kidney cancer Maternal Uncle    Prostate cancer Neg  Hx    Bladder Cancer Neg Hx     Social History   Socioeconomic History   Marital status: Married    Spouse name: Not on file   Number of children: Not on file   Years of education: Not on file   Highest education level: Not on file  Occupational History   Not on file  Tobacco Use   Smoking status: Never   Smokeless tobacco: Never  Vaping Use   Vaping Use: Never used  Substance and Sexual Activity   Alcohol use: No   Drug use: No   Sexual activity: Not on file  Other Topics Concern   Not on file  Social History Narrative   Not on file   Social Determinants of Health   Financial Resource Strain: Not on file  Food Insecurity: Not on file  Transportation Needs: Not on file  Physical Activity: Not on file  Stress: Not on file  Social Connections: Not on file    Review of Systems: A 12 point ROS discussed and pertinent positives are indicated in the HPI above.  All other systems are negative.  Review of Systems  Vital Signs: BP (!) 150/81 (BP Location: Right Arm, Patient Position: Sitting, Cuff Size: Normal)   Pulse 77   Temp 97.7 F (36.5 C) (Oral)   SpO2 97%   Physical Exam Constitutional:      General: He is not in acute distress.    Appearance: Normal appearance.  HENT:     Head: Normocephalic and atraumatic.  Eyes:      General: No scleral icterus. Cardiovascular:     Rate and Rhythm: Normal rate. Rhythm irregular.  Pulmonary:     Effort: Pulmonary effort is normal.  Abdominal:     General: Abdomen is flat.  Musculoskeletal:       Back:     Comments: TTP over the L2 spinous process  Skin:    General: Skin is warm and dry.  Neurological:     Mental Status: He is alert and oriented to person, place, and time.  Psychiatric:        Mood and Affect: Mood normal.        Behavior: Behavior normal.      Imaging: MR LUMBAR SPINE WO CONTRAST  Result Date: 05/07/2022 CLINICAL DATA:  Chronic low back pain EXAM: MRI LUMBAR SPINE WITHOUT CONTRAST TECHNIQUE: Multiplanar, multisequence MR imaging of the lumbar spine was performed. No intravenous contrast was administered. COMPARISON:  03/11/2019 FINDINGS: Segmentation: In correlation with the numbering system utilized on the previous report, the lowest well developed disc space is designated as L5-S1 with a partially formed vestigial disc at S1-S2. Alignment:  Physiologic. Vertebrae: Acute superior endplate compression fracture of the L2 vertebral body with 55% vertebral body height loss. Slight retropulsion of the posterior vertebral body wall. Fracture line involves the bilateral pedicles. Marked bone marrow edema throughout the vertebral body. Mild marrow edema along the inferior endplate of L1, which may be discogenic or reflect bone contusion. No additional fracture. No evidence of discitis. No suspicious bone lesion. Conus medullaris and cauda equina: Conus extends to the L2-3 level. Conus and cauda equina appear normal. No evidence of distal cord contusion at the L2 fracture site. No epidural hematoma. Paraspinal and other soft tissues: Trabeculated appearance of the visualized urinary bladder wall. Disc levels: T12-L1: Mild disc bulge with mild bilateral facet arthropathy and ligamentum flavum buckling. No foraminal or canal stenosis. L1-L2: Mild disc bulge and  minimal retropulsion  of the L2 superior endplate. Mild bilateral facet hypertrophy with ligamentum flavum buckling. Findings result in mild canal stenosis without foraminal stenosis. L2-L3: Mild disc bulge with mild bilateral facet arthropathy and ligamentum flavum buckling. Mild canal stenosis. No significant foraminal stenosis. Unchanged. L3-L4: Mild diffuse disc bulge with mild bilateral facet arthropathy and ligamentum flavum buckling. Findings result in mild canal stenosis with mild bilateral foraminal stenosis. Unchanged. L4-L5: Mild annular disc bulge with mild bilateral facet arthropathy and marked ligamentum flavum thickening. Findings result in severe canal stenosis with mild bilateral foraminal stenosis. Findings progressed from prior. L5-S1: Minimal disc bulge with endplate ridging. Mild bilateral facet arthropathy with ligamentum flavum buckling. Findings result in moderate canal stenosis. Borderline-mild bilateral foraminal stenosis. Unchanged. S1-S2: No impingement. IMPRESSION: 1. Acute superior endplate compression fracture of the L2 vertebral body with 55% vertebral body height loss and slight retropulsion of the posterior vertebral body wall. Mild canal stenosis at this level. 2. Multilevel degenerative changes of the lumbar spine, as described above, progressed at the L4-5 level where there is severe canal stenosis and mild bilateral foraminal stenosis. These results will be called to the ordering clinician or representative by the Radiologist Assistant, and communication documented in the PACS or [employer]. Electronically Signed   By: Davina Poke D.O.   On: 05/07/2022 09:30   DG Radiologist Eval And Mgmt  Result Date: 05/26/2022 EXAM: NEW PATIENT OFFICE VISIT CHIEF COMPLAINT: SEE EPIC NOTE HISTORY OF PRESENT ILLNESS: SEE EPIC NOTE REVIEW OF SYSTEMS: SEE EPIC NOTE PHYSICAL EXAMINATION: SEE EPIC NOTE ASSESSMENT AND PLAN: SEE EPIC NOTE Electronically Signed   By: Jacqulynn Cadet  M.D.   On: 05/26/2022 13:31    Labs:  CBC: Recent Labs    02/16/22 0403 02/17/22 0308 02/20/22 0432 02/21/22 0504  WBC 6.5 6.9 7.5 6.3  HGB 11.9* 12.1* 9.9* 9.2*  HCT 34.6* 34.3* 28.4* 27.1*  PLT 173 168 176 187    COAGS: Recent Labs    02/16/22 0403 02/16/22 1820 02/18/22 1346 02/20/22 0432 02/21/22 0504 02/22/22 0600  INR 2.1*   < > 1.2 1.2 1.3* 1.5*  APTT 47*  --   --   --   --   --    < > = values in this interval not displayed.    BMP: Recent Labs    02/19/22 0423 02/20/22 0432 02/21/22 0504 02/22/22 0600  NA 133* 131* 133* 137  K 3.7 4.2 3.9 4.0  CL 94* 96* 95* 100  CO2 '29 27 27 30  '$ GLUCOSE 110* 145* 103* 103*  BUN 47* 35* 38* 42*  CALCIUM 8.7* 8.3* 8.0* 8.2*  CREATININE 1.84* 1.31* 1.49* 1.42*  GFRNONAA 36* 54* 47* 49*    LIVER FUNCTION TESTS: Recent Labs    02/17/22 0953  BILITOT 1.2  AST 22  ALT 23  ALKPHOS 35*  PROT 6.0*  ALBUMIN 3.4*    TUMOR MARKERS: No results for input(s): AFPTM, CEA, CA199, CHROMGRNA in the last 8760 hours.  Assessment and Plan:  Very pleasant 81 year old gentleman with a symptomatic subacute L2 compression fracture.  His pain is quite severe rating an 8-9 out of 10 and less he is taking tramadol frequently.  Additionally, his pain is debilitating.  He scores a 15 out of 24 on the [employer] disability questionnaire.  He is an excellent candidate for percutaneous cement augmentation with balloon kyphoplasty.  I believe he would also be a good candidate for outpatient treatment at the [facility] location.  1.)  Please schedule  for cement augmentation with balloon kyphoplasty at L2.  [facility] location preferred by the patient. 2.)  I will ask my office staff to reach out to his cardiologist to determine if he can simply hold his Eliquis or if he needs to be bridged with Lovenox.  We would need to hold the Eliquis for 2 days prior to the procedure as well as the day of the procedure.  This would likely be 3 days in  total.  Thank you for this interesting consult.  I greatly enjoyed meeting Robert Rivas [facility] and look forward to participating in their care.  A copy of this report was sent to the requesting provider on this date.  Electronically Signed: Criselda Peaches 05/26/2022, 1:40 PM   I spent a total of  60 Minutes in face to face in clinical consultation, greater than 50% of which was counseling/coordinating care for L2 compression fracture

## 2022-06-06 NOTE — Discharge Instructions (Signed)
Kyphoplasty Post Procedure Discharge Instructions  May resume a regular diet and any medications that you routinely take (including pain medications). However, if you are taking Aspirin or an anticoagulant/blood thinner you will be told when you can resume taking these by the healthcare provider. No driving day of procedure. The day of your procedure take it easy. You may use an ice pack as needed to injection sites on back.  Ice to back 30 minutes on and 30 minutes off, as needed. May remove bandaids tomorrow after taking a shower. Replace daily with a clean bandaid until healed.  Do not lift anything heavier than a milk jug for 1-2 weeks or determined by your physician.  Follow up with your physician in 2 weeks.    Please contact our office at [phone] for the following symptoms or if you have any questions:  Fever greater than 100 degrees Increased swelling, pain, or redness at injection site. Increased back and/or leg pain New numbness or change in symptoms from before the procedure.    Thank you for visiting [facility].   RESUME ELIQUIS 24 HOURS AFTER PROCEDURE!

## 2022-06-07 ENCOUNTER — Ambulatory Visit
Admission: RE | Admit: 2022-06-07 | Discharge: 2022-06-07 | Disposition: A | Discharge status: Home / Self Care | Payer: Medicare Other | Source: Ambulatory Visit | Attending: Family Medicine | Admitting: Family Medicine

## 2022-06-07 DIAGNOSIS — S32020A Wedge compression fracture of second lumbar vertebra, initial encounter for closed fracture: Secondary | ICD-10-CM

## 2022-06-07 HISTORY — PX: IR KYPHO LUMBAR INC FX REDUCE BONE BX UNI/BIL CANNULATION INC/IMAGING: IMG5519

## 2022-06-07 MED ORDER — FENTANYL CITRATE PF 50 MCG/ML IJ SOSY
25.0000 ug | PREFILLED_SYRINGE | INTRAMUSCULAR | Status: DC | PRN
  Administered 2022-06-07: 50 ug via INTRAVENOUS

## 2022-06-07 MED ORDER — SODIUM CHLORIDE 0.9 % IV SOLN: INTRAVENOUS | Status: DC

## 2022-06-07 MED ORDER — CEFAZOLIN SODIUM-DEXTROSE 2-4 GM/100ML-% IV SOLN
2.0000 g | INTRAVENOUS | Status: AC
  Administered 2022-06-07: 2 g via INTRAVENOUS

## 2022-06-07 MED ORDER — IOPAMIDOL (ISOVUE-M 200) INJECTION 41%
1.0000 mL | Freq: Once | INTRAMUSCULAR | Status: AC
  Administered 2022-06-07: 1 mL

## 2022-06-07 MED ORDER — MIDAZOLAM HCL 2 MG/2ML IJ SOLN
1.0000 mg | INTRAMUSCULAR | Status: DC | PRN
  Administered 2022-06-07 (×2): 0.5 mg via INTRAVENOUS

## 2022-06-07 MED ORDER — ACETAMINOPHEN 10 MG/ML IV SOLN
1000.0000 mg | Freq: Once | INTRAVENOUS | Status: AC
  Administered 2022-06-07: 1000 mg via INTRAVENOUS

## 2022-06-07 NOTE — Progress Notes (Signed)
Pt back in nursing recovery area. Pt still drowsy from procedure but will wake up when spoken to. Pt follows commands, talks in complete sentences and has no complaints at this time. Pt will remain in nursing station until discharge.  ?

## 2022-06-16 ENCOUNTER — Other Ambulatory Visit: Payer: Self-pay | Admitting: Interventional Radiology

## 2022-06-16 DIAGNOSIS — S32020G Wedge compression fracture of second lumbar vertebra, subsequent encounter for fracture with delayed healing: Secondary | ICD-10-CM

## 2022-06-30 ENCOUNTER — Ambulatory Visit
Admission: RE | Admit: 2022-06-30 | Discharge: 2022-06-30 | Disposition: A | Discharge status: Home / Self Care | Payer: Medicare Other | Source: Ambulatory Visit | Attending: Interventional Radiology | Admitting: Interventional Radiology

## 2022-06-30 DIAGNOSIS — S32020G Wedge compression fracture of second lumbar vertebra, subsequent encounter for fracture with delayed healing: Secondary | ICD-10-CM

## 2022-06-30 HISTORY — PX: IR RADIOLOGIST EVAL & MGMT: IMG5224

## 2022-06-30 NOTE — Progress Notes (Signed)
Chief Complaint: Patient was consulted remotely today (TeleHealth) for osteoporotic L2 compression fracture at the request of Timmothy Baranowski K.    Referring Physician(s): Bruna Dills K  History of Present Illness: Robert Rivas is a 81 y.o. male who was initially seen earlier in June within L2 compression fracture which was significantly painful and debilitating.  He subsequently underwent cement augmentation with balloon kyphoplasty on June 13th.  We spoke over the telephone today for his follow-up evaluation.    Robert Rivas is pleased to report that his pain has completely resolved.  He said that the procedure was relatively easy to go through and that our staff for excellent.  He is most pleased with his results.    He has no other active complaints or issues at this time.    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   L upper back 5.0cm inf to base of neck, 5.0cm lat to spine   Hyperlipidemia    Hypertension    MI, old    Nocturia    Phlebitis    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   SCC IS R foreearm distal   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   SCC IS R forearm proximal   Squamous cell carcinoma of skin 03/26/2018   SCC IS R forearm   Squamous cell carcinoma of skin 01/01/2008   SCC IS R forearm   Wears dentures    partial    Past Surgical History:  Procedure Laterality Date   cardaic stents     CARDIAC CATHETERIZATION     PTCA with Stent Placement   CARDIOVERSION N/A 04/13/2021   Procedure: CARDIOVERSION;  Surgeon: Corey Skains, MD;  Location: [facility] ORS;  Service: Cardiovascular;  Laterality: N/A;   CARDIOVERSION N/A 05/05/2021   Procedure: CARDIOVERSION;  Surgeon: Corey Skains, MD;  Location: [facility] ORS;  Service: Cardiovascular;  Laterality: N/A;   COLONOSCOPY WITH PROPOFOL N/A 12/15/2015   Procedure: COLONOSCOPY WITH PROPOFOL;  Surgeon: Lollie Sails, MD;  Location: [facility] END[facility]COPY;  Service: Endoscopy;  Laterality: N/A;   CORONARY ARTERY BYPASS GRAFT  1992   ECTROPION REPAIR Right 12/12/2017   Procedure: REPAIR OF ECTROPION SUTURES/EXTENSIVE RIGHT;  Surgeon: Karle Starch, MD;  Location: [facility];  Service: Ophthalmology;  Laterality: Right;   HERNIA REPAIR     HIP ARTHROPLASTY Left 02/19/2022   Procedure: ARTHROPLASTY BIPOLAR HIP (HEMIARTHROPLASTY);  Surgeon: Thornton Park, MD;  Location: [facility] ORS;  Service: Orthopedics;  Laterality: Left;   IR KYPHO LUMBAR INC FX REDUCE BONE BX UNI/BIL CANNULATION INC/IMAGING  06/07/2022   SVT ABLATION  02/22/2017   [facility]    Allergies: Codeine and Tizanidine  Medications: Prior to Admission medications   Medication Sig Start Date End Date Taking? Authorizing Provider  amiodarone (PACERONE) 200 MG tablet Take 200 mg by mouth daily. Patient not taking: Reported on 05/26/2022 01/28/22   [provider]  apixaban (ELIQUIS) 2.5 MG TABS tablet  Eliquis 2.5 mg tablet 03/14/22   [provider]  docusate sodium (COLACE) 100 MG capsule Take 100 mg by mouth in the morning.    [provider]  ezetimibe (ZETIA) 10 MG tablet Take 10 mg by mouth at bedtime.     [provider]  hydrALAZINE (APRESOLINE) 50 MG tablet Take 50 mg by mouth 2 (two) times daily. 11/25/21   [provider]  levothyroxine (SYNTHROID) 50 MCG tablet Take 1 tablet (50 mcg total) by mouth daily at 6 (six) AM. 02/23/22   Sharen Hones, MD  metoprolol tartrate (LOPRESSOR) 25 MG tablet Take 25 mg by mouth 2 (two) times daily. Patient not taking: Reported on  05/26/2022    [provider]  montelukast (SINGULAIR) 10 MG tablet Take 10 mg by mouth at bedtime. 08/12/16   [provider]  nitroGLYCERIN (NITROSTAT) 0.4 MG SL tablet Place 0.4 mg under the tongue every 5 (five) minutes x 3 doses as needed for chest pain. 04/09/14   [provider]  olmesartan (BENICAR) 40 MG tablet Take 40 mg by mouth in the morning and at bedtime.    [provider]  oxyCODONE (OXY IR/ROXICODONE) 5 MG immediate release tablet Take 1-2 tablets (5-10 mg total) by mouth every 4 (four) hours as needed for moderate pain (pain score 4-6). Patient not taking: Reported on 05/26/2022 02/22/22   Sharen Hones, MD  QUEtiapine (SEROQUEL) 25 MG tablet Take 1 tablet (25 mg total) by mouth at bedtime for 7 days. 02/22/22 03/01/22  Sharen Hones, MD  simvastatin (ZOCOR) 20 MG tablet Take 20 mg by mouth at bedtime. 04/24/19   [provider]  sodium chloride 1 g tablet Take 1 tablet (1 g total) by mouth 2 (two) times daily with a meal. 02/22/22   Sharen Hones, MD  solifenacin (VESICARE) 10 MG tablet Take 10 mg by mouth daily. 05/03/21   [provider]  SYRINGE-NEEDLE, DISP, 3 ML 25G X 1" 3 ML MISC Use 1 Syringe monthly. 05/07/15   [provider]  tamsulosin (FLOMAX) 0.4 MG CAPS capsule Take 0.4 mg by mouth at bedtime. 01/28/21   [provider]  traMADol (ULTRAM) 50 MG tablet Take 50 mg by mouth 3 (three) times daily as needed. 05/06/22   [provider]  warfarin (COUMADIN) 2 MG tablet Take 4 mg by mouth at bedtime. Patient not taking: Reported on 05/26/2022 04/24/19   [provider]     Family History  Problem Relation Age of Onset   Kidney cancer Maternal Uncle    Prostate cancer Neg Hx    Bladder Cancer Neg Hx     Social History   Socioeconomic History   Marital status: Married    Spouse name: Not on file   Number of children: Not on file   Years of education: Not on file   Highest education level: Not on  file  Occupational History   Not on file  Tobacco Use   Smoking status: Never   Smokeless tobacco: Never  Vaping Use   Vaping Use: Never used  Substance and Sexual Activity   Alcohol use: No   Drug use: No   Sexual activity: Not on file  Other Topics Concern   Not on file  Social History Narrative   Not on file   Social Determinants of Health   Financial Resource Strain: Not on file  Food Insecurity: Not on file  Transportation Needs: Not on file  Physical Activity: Not on file  Stress: Not on file  Social Connections: Not on file    Review of Systems  Review of Systems: A 12 point ROS discussed and pertinent positives are indicated in the HPI above.  All other systems are negative.   Physical Exam No direct physical exam was performed (except for noted visual exam findings with Video Visits).    Vital Signs: There were no vitals taken for this visit.  Imaging: IR KYPHO LUMBAR INC FX REDUCE BONE BX UNI/BIL CANNULATION INC/IMAGING  Result Date: 06/07/2022 CLINICAL DATA:  81 year old male with age related osteoporosis and current pathologic fracture of the L2 vertebral body; subsequent encounter for fracture with delayed healing. M80.08XG EXAM: FLUOROSCOPIC GUIDED KYPHOPLASTY OF THE L2 VERTEBRAL BODY COMPARISON:  None Available. MEDICATIONS: As antibiotic prophylaxis, 2 g Ancef was ordered pre-procedure and administered intravenously within 1 hour of incision. ANESTHESIA/SEDATION: Moderate (conscious) sedation was employed during this procedure. A total of Versed 1 mg and Fentanyl 50 mcg was administered intravenously. Moderate Sedation Time: 18 minutes. The patient's level of consciousness and vital signs were monitored continuously by radiology nursing throughout the procedure under my direct supervision. FLUOROSCOPY TIME:  Radiation exposure index: 35.7 mGy reference air kerma COMPLICATIONS: None immediate. PROCEDURE: The procedure, risks (including but not limited to  bleeding, infection, organ damage), benefits, and alternatives were explained to the patient. Questions regarding the procedure were encouraged and answered. The patient understands and consents to the procedure. The patient has suffered a fracture of the L2. It is recommended that patients aged 48 years or older be evaluated for possible testing or treatment of osteoporosis. The patient was placed prone on the fluoroscopic table. The skin overlying the upper thoracic region was then prepped and draped in the usual sterile fashion. Maximal barrier sterile technique was utilized including caps, mask, sterile gowns, sterile gloves, sterile drape, hand hygiene and skin antiseptic. Intravenous Fentanyl and Versed were administered as conscious sedation during continuous cardiorespiratory monitoring by the radiology RN. The right pedicle at L2 was then infiltrated with 1% lidocaine followed by the advancement of a Kyphon trocar needle through the left pedicle into the posterior one-third of the vertebral body. Subsequently, the osteo drill was advanced to the anterior third of the vertebral body. The osteo drill was retracted. Through the working cannula, a Kyphon inflatable bone tamp 15 x 2.5 was advanced and positioned with the distal marker approximately 5 mm from the anterior aspect of the cortex. Appropriate positioning was confirmed on the AP projection. At this time, the balloon was expanded using contrast via a Kyphon inflation syringe device via micro tubing. Inflations were continued until there was near apposition with the superior end plate. At this time, methylmethacrylate mixture was reconstituted in the Kyphon bone mixing device system. This was then loaded into the delivery mechanism, attached to the cement delivery system. The balloon was deflated and removed followed by the instillation of methylmethacrylate mixture with excellent filling in the AP and lateral projections. No extravasation was noted in  the disk spaces or posteriorly into the spinal canal. No epidural venous contamination was seen. The working cannulae and the bone filler were then retrieved and removed. Hemostasis was achieved with manual compression. The patient tolerated the procedure well without immediate postprocedural complication. IMPRESSION: 1. Technically successful L2 vertebral body augmentation using balloon kyphoplasty. 2. Per CMS PQRS reporting requirements (PQRS Measure 24): Given the patient's age of greater than 42 and the fracture site (hip, distal radius, or spine), the patient should be tested for osteoporosis  using DXA, and the appropriate treatment considered based on the DXA results. Electronically Signed   By: Jacqulynn Cadet M.D.   On: 06/07/2022 10:55    Labs:  CBC: Recent Labs    02/16/22 0403 02/17/22 0308 02/20/22 0432 02/21/22 0504  WBC 6.5 6.9 7.5 6.3  HGB 11.9* 12.1* 9.9* 9.2*  HCT 34.6* 34.3* 28.4* 27.1*  PLT 173 168 176 187    COAGS: Recent Labs    02/16/22 0403 02/16/22 1820 02/18/22 1346 02/20/22 0432 02/21/22 0504 02/22/22 0600  INR 2.1*   < > 1.2 1.2 1.3* 1.5*  APTT 47*  --   --   --   --   --    < > = values in this interval not displayed.    BMP: Recent Labs    02/19/22 0423 02/20/22 0432 02/21/22 0504 02/22/22 0600  NA 133* 131* 133* 137  K 3.7 4.2 3.9 4.0  CL 94* 96* 95* 100  CO2 '29 27 27 30  '$ GLUCOSE 110* 145* 103* 103*  BUN 47* 35* 38* 42*  CALCIUM 8.7* 8.3* 8.0* 8.2*  CREATININE 1.84* 1.31* 1.49* 1.42*  GFRNONAA 36* 54* 47* 49*    LIVER FUNCTION TESTS: Recent Labs    02/17/22 0953  BILITOT 1.2  AST 22  ALT 23  ALKPHOS 35*  PROT 6.0*  ALBUMIN 3.4*    TUMOR MARKERS: No results for input(s): "AFPTM", "CEA", "CA199", "CHROMGRNA" in the last 8760 hours.  Assessment and Plan:  Pleasant 81 year old gentleman with a history of L2 compression fracture status post cement augmentation with balloon kyphoplasty on 06/07/2022.  He has completely  recovered from the procedure and reports that his back pain has resolved completely.  He is extremely pleased with his result in with the service of our staff.  He was most complimentary.    No further follow-up scheduled at this time.  If Robert Rivas should ever develop a new fracture or recurrent back pain, I would be happy to see him again at any time in the future.      Electronically Signed: Criselda Peaches 06/30/2022, 1:18 PM   I spent a total of  10 Minutes in remote  clinical consultation, greater than 50% of which was counseling/coordinating care for L2 compression fracture s/p KP.    Visit type: Audio only (telephone). Audio (no video) only due to patient preference. Alternative for in-person consultation at [facility], [address] [place], [place], [place]. This visit type was conducted due to national recommendations for restrictions regarding the COVID-19 Pandemic (e.g. social distancing).  This format is felt to be most appropriate for this patient at this time.  All issues noted in this document were discussed and addressed.

## 2022-07-05 ENCOUNTER — Ambulatory Visit: Payer: Medicare Other | Attending: Internal Medicine

## 2022-07-05 DIAGNOSIS — R262 Difficulty in walking, not elsewhere classified: Secondary | ICD-10-CM | POA: Insufficient documentation

## 2022-07-05 DIAGNOSIS — M5459 Other low back pain: Secondary | ICD-10-CM | POA: Diagnosis not present

## 2022-07-05 DIAGNOSIS — R2681 Unsteadiness on feet: Secondary | ICD-10-CM | POA: Insufficient documentation

## 2022-07-05 NOTE — Therapy (Signed)
[facility] [address], [place], [address] Phone: [phone]   Fax:  [phone]  Physical Therapy Evaluation  Patient Details  Name: Robert Rivas MRN: [id number] Date of Birth: 1941/09/28 Referring Provider (PT): Idelle Crouch, MD   Encounter Date: 07/05/2022   PT End of Session - 07/05/22 1332     Visit Number 1    Number of Visits 17    Date for PT Re-Evaluation 09/01/22    Authorization Type 1    Authorization Time Period 10    PT Start Time 1332    PT Stop Time 1419    PT Time Calculation (min) 47 min    Equipment Utilized During Treatment Gait belt   SPC   Activity Tolerance Patient tolerated treatment well    Behavior During Therapy WFL for tasks assessed/performed             Past Medical History:  Diagnosis Date   A-fib ([place])    Arthritis    lower back   Basal cell carcinoma 10/29/2020   Right post base of skull/neck   BPH (benign prostatic hyperplasia)    Cancer (HCC)    Skin Cancer   Chronic kidney disease    Chronic Kidney Disease   Coronary artery disease    Dysrhythmia    Atrial Fibrillation; Supraventricular Tachycardia   Gout    Hearing aid worn    has, does not wear   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   L upper back 5.0cm inf to base of neck, 5.0cm lat to spine   Hyperlipidemia    Hypertension    MI, old    Nocturia    Phlebitis    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   SCC IS R foreearm distal   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   SCC IS R forearm proximal   Squamous cell carcinoma of skin 03/26/2018   SCC IS R forearm   Squamous cell carcinoma of skin 01/01/2008   SCC IS R forearm   Wears dentures    partial    Past Surgical History:  Procedure Laterality Date   cardaic stents     CARDIAC CATHETERIZATION     PTCA with Stent Placement   CARDIOVERSION N/A 04/13/2021   Procedure: CARDIOVERSION;  Surgeon: Corey Skains, MD;   Location: [facility];  Service: Cardiovascular;  Laterality: N/A;   CARDIOVERSION N/A 05/05/2021   Procedure: CARDIOVERSION;  Surgeon: Corey Skains, MD;  Location: [facility];  Service: Cardiovascular;  Laterality: N/A;   COLONOSCOPY WITH PROPOFOL N/A 12/15/2015   Procedure: COLONOSCOPY WITH PROPOFOL;  Surgeon: Lollie Sails, MD;  Location: [facility];  Service: Endoscopy;  Laterality: N/A;   CORONARY ARTERY BYPASS GRAFT  1992   ECTROPION REPAIR Right 12/12/2017   Procedure: REPAIR OF ECTROPION SUTURES/EXTENSIVE RIGHT;  Surgeon: Karle Starch, MD;  Location: [facility];  Service: Ophthalmology;  Laterality: Right;   HERNIA REPAIR     HIP ARTHROPLASTY Left 02/19/2022   Procedure: ARTHROPLASTY BIPOLAR HIP (HEMIARTHROPLASTY);  Surgeon: Thornton Park, MD;  Location: [facility];  Service: Orthopedics;  Laterality: Left;   IR KYPHO LUMBAR INC FX REDUCE BONE BX UNI/BIL CANNULATION INC/IMAGING  06/07/2022   IR RADIOLOGIST EVAL & MGMT  06/30/2022   SVT ABLATION  02/22/2017   [facility]    There were no vitals filed for this visit.    Subjective Assessment - 07/05/22 1344  Subjective LBP: 0/10 currently, 10/10 at worst for the past 3 months.    Pertinent History LBP, balance. Pt fell 02/17/2022 while trying to step up onto his half step to get into his home in the garage. Pt tripped on the rug which is no longer there. Pt fractured L hip S/P L hip hemiarthroplasty on 02/19/2022. Pt underwent home health PT for the hip rehab. Pt fell again a couple of weeks after participating in rehab resulting in lumbar fracture. Does not remember what caused the fall. Pt was doing something in the hall. Pt does not remember if he was using an AD. Pt had a shot in his back which took away the pain. Pt started using his SPC a couple of weeks ago. Home health PT ended last week.  Independent ambulation prior to L hip fracture    Patient Stated Goals Improve balance.    Currently in Pain? No/denies    Pain  Score 0-No pain    Pain Orientation Posterior;Lower    Pain Type Acute pain    Pain Onset More than a month ago    Pain Frequency Occasional    Aggravating Factors  Standing for prolonged periods (about an hour).    Pain Relieving Factors Sitting, shot in back, tramadol, tylenol.                [facility] PT Assessment - 07/05/22 1338       Assessment   Medical Diagnosis LBP    Referring Provider (PT) Doy Hutching Leonie Douglas, MD    Onset Date/Surgical Date 06/30/22   Date PT referral signed     Precautions   Precaution Comments Fall risk      Restrictions   Other Position/Activity Restrictions No known restrictions      Balance Screen   Has the patient fallen in the past 6 months Yes    How many times? 2    Has the patient had a decrease in activity level because of a fear of falling?  Yes    Is the patient reluctant to leave their home because of a fear of falling?  Yes      Home Environment   Additional Comments Pt lives in a 1 story home with wift, half a step to enter from the garage, no rails.      Observation/Other Assessments   Focus on Therapeutic Outcomes (FOTO)  Lumbar Spine FOTO 40      Posture/Postural Control   Posture Comments R lateral shift, B protracted shoulders, decreased B hip extension, forward neck, kyphosis, L hip higher      AROM   Lumbar Flexion WFL with increased pain.   Performed in sitting   Lumbar Extension very limited    Lumbar - Right Side Bend WFL    Lumbar - Left Side Bend WFL    Lumbar - Right Rotation WFL with low back pain    Lumbar - Left Rotation [facility]      Strength   Right Hip Flexion 4/5    Right Hip Extension 4+/5   seated manually resisted   Right Hip ABduction 4+/5   seated manually resisted clamshell   Left Hip Flexion 4-/5    Left Hip Extension 4+/5   seated manually resisted   Left Hip ABduction 4/5   seated manually resisted clamshell   Right Knee Flexion 5/5    Right Knee Extension 5/5    Left Knee Flexion 4/5    Left  Knee Extension 5/5  Special Tests   Other special tests TUG with SPC on R: 19 seconds, 19 seconds, 17 seconds   18.33 seconds average     Ambulation/Gait   Gait Comments Gait: SPC on R, antalgic, decreased stance L LE, step to pattern, very slight wobble R LE during stance phase with increased distance. Pt ambulated about 40 ft.                        Objective measurements completed on examination: See above findings.   No BP problems No latex allergies  No pacemaker/defibrillator No back surgery.         Gait: SPC on R, antalgic, decreased stance L LE, step to pattern, very slight wobble R LE during stance phase with increased distance. Pt ambulated about 40 ft.   Per pt wife, Pt has a difficult time with initial standing after sitting. Difficulty with stair negotiation.   Pt fell 02/17/2022 while trying to step up onto his half step to get into his home in the garage. Pt tripped on the rug which is no longer there. Pt fractured L hip S/P L hip hemiarthroplasty on 02/19/2022. Pt underwent home health PT for the hip rehab. Pt fell again a couple of weeks after participating in rehab resulting in lumbar fracture. Does not remember what caused the fall. Pt was doing something in the hall. Pt does not remember if he was using an AD. Pt had a shot in his back which took away the pain. Pt started using his SPC a couple of weeks ago. Home health PT ended last week.  Independent ambulation prior to L hip fracture   Therapeutic exercise  Forward step up onto first regular steps with B UE assist 10x each LE. CGA  Seated transversus abdominis contraction 10x5 seconds   Felt good per pt  Reviewed and given as part of his HEP. Pt demonstrated and verbalized understanding. Handout provided.      Try next visit if appropriate: Seated B scapular retraction    Improved exercise technique, movement at target joints, use of target muscles after mod verbal, visual, tactile  cues.    Response to treatment Pt tolerated session well without aggravation of symptoms.    Clinical impression Pt is an 81 year old male who came to physical therapy secondary to low back pain. He also demonstrates decreased balance, altered gait pattern and posture, trunk weakness, decreased bilateral hip extension, reproduction of back pain with lumbar flexion as well as R rotation, and difficulty performing standing tasks, stair negotiation, and transfers secondary to back pain and difficulty with balance. Pt will benefit from skilled physical therapy services to address the aforementioned deficits.             Access Code: [email or web address] URL: [email or web address] Date: 07/05/2022 Prepared by: Joneen Boers  Exercises - Seated Transversus Abdominis Bracing  - 1 x daily - 7 x weekly - 3 sets - 10 reps - 5 seconds hold             PT Education - 07/05/22 1819     Education Details ther-ex, HEP, POC    Person(s) Educated Patient    Methods Explanation;Demonstration;Tactile cues;Verbal cues;Handout    Comprehension Verbalized understanding;Returned demonstration              PT Short Term Goals - 07/05/22 1808       PT SHORT TERM GOAL #1   Title Pt will be independent with his initial  HEP to decrease pain, improve strength, balance, and function.    Baseline Pt has started his initial HEP (07/05/2022)    Time 3    Period Weeks    Status New    Target Date 07/28/22               PT Long Term Goals - 07/05/22 1813       PT LONG TERM GOAL #1   Title Patient will have a decrease in low back pain to 4/10 or less at worst to promote ability to perform standing tasks, ambulate, and negotiate stairs with less difficulty.    Baseline 10/10 low back pain at worst for the past 3 months (07/05/2022)    Time 8    Period Weeks    Status New    Target Date 09/01/22      PT LONG TERM GOAL #2   Title Patient will improve his lumbar FOTO score by  at least 10 points as a demonstration of improved function.    Baseline Lumbar spine FOTO 40 (07/05/2022)    Time 8    Period Weeks    Status New    Target Date 09/01/22      PT LONG TERM GOAL #3   Title Pt will improve his TUG score to 12 secodns or less without SPC as a demonstration of improved balance and functional mobility.    Baseline 18.33 seconds average with SPC on R side (07/05/2022)    Time 8    Period Weeks    Status New    Target Date 09/01/22      PT LONG TERM GOAL #4   Title Pt will improve bilateral bilateral hip extension and abduction to promote ability to perform standing tasks more steadily.    Baseline Hip extension 4+/5 R and L, hip abduction 4+/5 R, 4/5 L (07/05/2022)    Time 8    Period Weeks    Status New    Target Date 09/01/22                    Plan - 07/05/22 1821     Clinical Impression Statement Pt is an 81 year old male who came to physical therapy secondary to low back pain. He also demonstrates decreased balance, altered gait pattern and posture, trunk weakness, decreased bilateral hip extension, reproduction of back pain with lumbar flexion as well as R rotation, and difficulty performing standing tasks, stair negotiation, and transfers secondary to back pain and difficulty with balance. Pt will benefit from skilled physical therapy services to address the aforementioned deficits.    Personal Factors and Comorbidities Age;Comorbidity 3+;Past/Current Experience;Time since onset of injury/illness/exacerbation    Comorbidities A-fib, arthritis, basal cell CA, chronic kidney disease, dysrhythmia, HTN, MI, phlebitis, L hip hemiarthroplasty    Examination-Activity Limitations Stairs;Stand;Bend;Locomotion Level;Transfers    Stability/Clinical Decision Making Stable/Uncomplicated    Clinical Decision Making Low    Rehab Potential Fair    PT Frequency 2x / week    PT Duration 8 weeks    PT Treatment/Interventions Therapeutic exercise;Therapeutic  activities;Stair training;Functional mobility training;Balance training;Neuromuscular re-education;Patient/family education;Manual techniques;Dry needling;Aquatic Therapy;Electrical Stimulation;Iontophoresis '4mg'$ /ml Dexamethasone;Gait training    PT Next Visit Plan posture, thoracic and hip extension, trunk and hip strengthening, manual techniques, modalities PRN    PT Home Exercise Plan Medbridge Access Code: [email or web address]    Consulted and Agree with Plan of Care Patient             Patient will  benefit from skilled therapeutic intervention in order to improve the following deficits and impairments:  Pain, Postural dysfunction, Improper body mechanics, Difficulty walking, Abnormal gait  Visit Diagnosis: Other low back pain - Plan: PT plan of care cert/re-cert  Unsteadiness on feet - Plan: PT plan of care cert/re-cert  Difficulty in walking, not elsewhere classified - Plan: PT plan of care cert/re-cert     Problem List Patient Active Problem List   Diagnosis Date Noted   Acute urinary retention 02/19/2022   Acute delirium 02/18/2022   Dysphagia 02/18/2022   Hyponatremia 02/15/2022   Closed left hip fracture ([place]) 02/14/2022   Atrial fibrillation with RVR ([place]) 04/12/2021   Bradycardia 07/08/2019   Venous insufficiency of both lower extremities 07/08/2019   Abnormal glucose 01/10/2019   Elevated PSA 01/10/2019   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   Palpitations 06/13/2018   Leg edema, right 09/20/2017   History of BPH 02/21/2017   BPH (benign prostatic hyperplasia) 04/25/2016   Nocturia 04/25/2016   Gout 03/25/2016   History of colon polyps 03/25/2016   Inflammation of a vein 03/25/2016   Percutaneous transluminal coronary angioplasty status 03/25/2016   Supraventricular tachycardia ([place]) 03/25/2016   Benign essential HTN 06/10/2015   Paroxysmal atrial fibrillation ([place]) 01/20/2015   Atrial fibrillation ([place]) 05/13/2014   Coronary artery disease 05/13/2014   Chest pain 05/13/2014    Stage 3a chronic kidney disease (CKD) ([place]) 05/13/2014   Essential (primary) hypertension 05/13/2014   History of cardiac catheterization 05/13/2014   HLD (hyperlipidemia) 05/13/2014   Combined fat and carbohydrate induced hyperlipemia 05/13/2014   History of cardiovascular surgery 05/13/2014   History of anticoagulant therapy 04/28/2014   Warfarin anticoagulation 04/28/2014   Benign prostatic hyperplasia with urinary obstruction 11/05/2013   Joneen Boers PT, DPT  07/05/2022, 6:37 PM  [facility] [address], [place], [address] Phone: [phone]   Fax:  [phone]  Name: KEANTE URIZAR MRN: [id number] Date of Birth: 07-Feb-1941

## 2022-07-07 ENCOUNTER — Ambulatory Visit: Payer: Medicare Other

## 2022-07-07 DIAGNOSIS — R262 Difficulty in walking, not elsewhere classified: Secondary | ICD-10-CM

## 2022-07-07 DIAGNOSIS — M5459 Other low back pain: Secondary | ICD-10-CM

## 2022-07-07 DIAGNOSIS — R2681 Unsteadiness on feet: Secondary | ICD-10-CM

## 2022-07-07 NOTE — Therapy (Signed)
[facility] [address] [place], [address] Phone: [phone]   Fax:  [phone]  Physical Therapy Treatment  Patient Details  Name: Robert Rivas MRN: [id number] Date of Birth: [birth year] Referring Provider (PT): Idelle Crouch, MD   Encounter Date: 07/07/2022   PT End of Session - 07/07/22 1109     Visit Number 2    Number of Visits 17    Date for PT Re-Evaluation 09/01/22    Authorization Type UHC Medicare Primary; Tricare for Life sceondary    Authorization Time Period 07/05/22-09/01/22    PT Start Time 1100    PT Stop Time 1140    PT Time Calculation (min) 40 min    Equipment Utilized During Treatment Gait belt    Activity Tolerance Patient tolerated treatment well;No increased pain    Behavior During Therapy WFL for tasks assessed/performed             Past Medical History:  Diagnosis Date   A-fib [facility])    Arthritis    lower back   Basal cell carcinoma 10/29/2020   Right post base of skull/neck   BPH (benign prostatic hyperplasia)    Cancer (HCC)    Skin Cancer   Chronic kidney disease    Chronic Kidney Disease   Coronary artery disease    Dysrhythmia    Atrial Fibrillation; Supraventricular Tachycardia   Gout    Hearing aid worn    has, does not wear   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   L upper back 5.0cm inf to base of neck, 5.0cm lat to spine   Hyperlipidemia    Hypertension    MI, old    Nocturia    Phlebitis    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   SCC IS R foreearm distal   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   SCC IS R forearm proximal   Squamous cell carcinoma of skin 03/26/2018   SCC IS R forearm   Squamous cell carcinoma of skin 01/01/2008   SCC IS R forearm   Wears dentures    partial    Past Surgical History:  Procedure Laterality Date   cardaic stents     CARDIAC CATHETERIZATION     PTCA with Stent Placement   CARDIOVERSION N/A  04/13/2021   Procedure: CARDIOVERSION;  Surgeon: Corey Skains, MD;  Location: [facility]S;  Service: Cardiovascular;  Laterality: N/A;   CARDIOVERSION N/A 05/05/2021   Procedure: CARDIOVERSION;  Surgeon: Corey Skains, MD;  Location: [facility];  Service: Cardiovascular;  Laterality: N/A;   COLONOSCOPY WITH PROPOFOL N/A 12/15/2015   Procedure: COLONOSCOPY WITH PROPOFOL;  Surgeon: Lollie Sails, MD;  Location: [facility] ENDOSCOPY;  Service: Endoscopy;  Laterality: N/A;   CORONARY ARTERY BYPASS GRAFT  1992   ECTROPION REPAIR Right 12/12/2017   Procedure: REPAIR OF ECTROPION SUTURES/EXTENSIVE RIGHT;  Surgeon: Karle Starch, MD;  Location: [place];  Service: Ophthalmology;  Laterality: Right;   HERNIA REPAIR     HIP ARTHROPLASTY Left 02/19/2022   Procedure: ARTHROPLASTY BIPOLAR HIP (HEMIARTHROPLASTY);  Surgeon: Thornton Park, MD;  Location: [facility]S;  Service: Orthopedics;  Laterality: Left;   IR KYPHO LUMBAR INC FX REDUCE BONE BX UNI/BIL CANNULATION INC/IMAGING  06/07/2022   IR RADIOLOGIST EVAL & MGMT  06/30/2022   SVT ABLATION  02/22/2017   [facility]    There were no vitals filed for this visit.   Subjective Assessment -  07/07/22 1104     Subjective Pt doing well today. He has started work on his HEP without any difficulty. Pt denies any pain after last session's evaluation.    Pertinent History LBP, balance. Pt fell 02/17/2022 while trying to step up onto his half step to get into his home in the garage. Pt tripped on the rug which is no longer there. Pt fractured L hip S/P L hip hemiarthroplasty on 02/19/2022. Pt underwent home health PT for the hip rehab. Pt fell again a couple of weeks after participating in rehab resulting in lumbar fracture. Does not remember what caused the fall. Pt was doing something in the hall. Pt does not remember if he was using an AD. Pt had a shot in his back which took away the pain. Pt started using his SPC a couple of weeks ago. Home health PT ended last  week.  Independent ambulation prior to L hip fracture.    Patient Stated Goals Improve balance.    Currently in Pain? No/denies            INTERVENTION 07/07/22  -STS from elevated plinth 2x10 hands free, chair in front for PRN self stabilization -Seated LAQ 1x10x3secH -seated marching 1x10x2secH   -seated BLE redTB clam ER (ball bewteen feet) 15x3secH  -seated BLE YellowTB hip IR (reverse clam) 15x3secH   -normal stance on airex pad with SPC x60sec (unable to balance hands free)  -normal stance on firm surface hands free x60sec, cues for forward gaze -hands-free marching in place x16, then 16x 1secH  -lateral side stepping 2x28f bilat  -backward AMB 1x328fc SPC, then 1542fithout device -fwd step taps x20 with 2 HHA -fwd step taps x20 no hands, minA to minGuard assist then education on improving Left trunk weight shift in Left SLS  -fwd STEP UPS, STEP DOWNS 1" STEP, SPC RUE x10 c 180 degree turnarounds -fwd step ups, step downs 3" step SPC RUE 4x each leg  *seated recover intervals provided throughout to maximize activity tolerance         PT Short Term Goals - 07/05/22 1808       PT SHORT TERM GOAL #1   Title Pt will be independent with his initial HEP to decrease pain, improve strength, balance, and function.    Baseline Pt has started his initial HEP (07/05/2022)    Time 3    Period Weeks    Status New    Target Date 07/28/22               PT Long Term Goals - 07/05/22 1813       PT LONG TERM GOAL #1   Title Patient will have a decrease in low back pain to 4/10 or less at worst to promote ability to perform standing tasks, ambulate, and negotiate stairs with less difficulty.    Baseline 10/10 low back pain at worst for the past 3 months (07/05/2022)    Time 8    Period Weeks    Status New    Target Date 09/01/22      PT LONG TERM GOAL #2   Title Patient will improve his lumbar FOTO score by at least 10 points as a demonstration of improved function.     Baseline Lumbar spine FOTO 40 (07/05/2022)    Time 8    Period Weeks    Status New    Target Date 09/01/22      PT LONG TERM GOAL #3   Title  Pt will improve his TUG score to 12 secodns or less without SPC as a demonstration of improved balance and functional mobility.    Baseline 18.33 seconds average with SPC on R side (07/05/2022)    Time 8    Period Weeks    Status New    Target Date 09/01/22      PT LONG TERM GOAL #4   Title Pt will improve bilateral bilateral hip extension and abduction to promote ability to perform standing tasks more steadily.    Baseline Hip extension 4+/5 R and L, hip abduction 4+/5 R, 4/5 L (07/05/2022)    Time 8    Period Weeks    Status New    Target Date 09/01/22                   Plan - 07/07/22 1115     Clinical Impression Statement Working on strenght and balance in standing. Pt is progressively taken throughout a variety of challenging activities for his balance control abilities, he is generally very risk averse and anxious about falling, rightfully so, but grows in his ability to trust not only his own abilities of righting, but also assistance from author for stabilization. Pt's internal righting mechanisms are quite undeveloped at this point, as he has been so heavily reliant on assistive device for self stabilization over the past several months.    Personal Factors and Comorbidities Age;Comorbidity 3+;Past/Current Experience;Time since onset of injury/illness/exacerbation    Comorbidities A-fib, arthritis, basal cell CA, chronic kidney disease, dysrhythmia, HTN, MI, phlebitis, L hip hemiarthroplasty    Examination-Activity Limitations Stairs;Stand;Bend;Locomotion Level;Transfers    Stability/Clinical Decision Making Stable/Uncomplicated    Rehab Potential Fair    PT Frequency 2x / week    PT Duration 8 weeks    PT Treatment/Interventions Therapeutic exercise;Therapeutic activities;Stair training;Functional mobility training;Balance  training;Neuromuscular re-education;Patient/family education;Manual techniques;Dry needling;Aquatic Therapy;Electrical Stimulation;Iontophoresis '4mg'$ /ml Dexamethasone;Gait training    PT Next Visit Plan posture, thoracic and hip extension, trunk and hip strengthening, manual techniques, modalities PRN    PT Home Exercise Plan Medbridge Access Code: [email or web address]    Consulted and Agree with Plan of Care Patient             Patient will benefit from skilled therapeutic intervention in order to improve the following deficits and impairments:  Pain, Postural dysfunction, Improper body mechanics, Difficulty walking, Abnormal gait  Visit Diagnosis: Other low back pain  Unsteadiness on feet  Difficulty in walking, not elsewhere classified     Problem List Patient Active Problem List   Diagnosis Date Noted   Acute urinary retention 02/19/2022   Acute delirium 02/18/2022   Dysphagia 02/18/2022   Hyponatremia 02/15/2022   Closed left hip fracture ([place]) 02/14/2022   Atrial fibrillation with RVR ([place]) 04/12/2021   Bradycardia 07/08/2019   Venous insufficiency of both lower extremities 07/08/2019   Abnormal glucose 01/10/2019   Elevated PSA 01/10/2019   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   Palpitations 06/13/2018   Leg edema, right 09/20/2017   History of BPH 02/21/2017   BPH (benign prostatic hyperplasia) 04/25/2016   Nocturia 04/25/2016   Gout 03/25/2016   History of colon polyps 03/25/2016   Inflammation of a vein 03/25/2016   Percutaneous transluminal coronary angioplasty status 03/25/2016   Supraventricular tachycardia ([place]) 03/25/2016   Benign essential HTN 06/10/2015   Paroxysmal atrial fibrillation ([place]) 01/20/2015   Atrial fibrillation ([place]) 05/13/2014   Coronary artery disease 05/13/2014   Chest pain 05/13/2014   Stage 3a chronic kidney disease (  CKD) ([place]) 05/13/2014   Essential (primary) hypertension 05/13/2014   History of cardiac catheterization 05/13/2014   HLD  (hyperlipidemia) 05/13/2014   Combined fat and carbohydrate induced hyperlipemia 05/13/2014   History of cardiovascular surgery 05/13/2014   History of anticoagulant therapy 04/28/2014   Warfarin anticoagulation 04/28/2014   Benign prostatic hyperplasia with urinary obstruction 11/05/2013   11:49 AM, 07/07/22 Etta Grandchild, PT, DPT Physical Therapist - [facility] [phone] (Office)   Pence C, PT 07/07/2022, 11:42 AM  [facility] [address] [place], [address] Phone: [phone]   Fax:  [phone]  Name: Robert Rivas MRN: [id number] Date of Birth: [birth year]

## 2022-07-12 ENCOUNTER — Ambulatory Visit: Payer: Medicare Other

## 2022-07-12 DIAGNOSIS — M5459 Other low back pain: Secondary | ICD-10-CM

## 2022-07-12 DIAGNOSIS — R2681 Unsteadiness on feet: Secondary | ICD-10-CM

## 2022-07-12 DIAGNOSIS — R262 Difficulty in walking, not elsewhere classified: Secondary | ICD-10-CM

## 2022-07-12 NOTE — Therapy (Signed)
OUTPATIENT PHYSICAL THERAPY TREATMENT NOTE   Patient Name: Robert Rivas MRN: [id number] DOB:1941-02-27, 81 y.o., male Today's Date: 07/12/2022  PCP: Idelle Crouch, MD REFERRING PROVIDER: Idelle Crouch, MD   PT End of Session - 07/12/22 1103     Visit Number 3    Number of Visits 17    Date for PT Re-Evaluation 09/01/22    Authorization Type UHC Medicare Primary; Tricare for Life sceondary    Authorization Time Period 07/05/22-09/01/22    PT Start Time 1103    PT Stop Time 1143    PT Time Calculation (min) 40 min    Equipment Utilized During Treatment Gait belt    Activity Tolerance Patient tolerated treatment well    Behavior During Therapy WFL for tasks assessed/performed             Past Medical History:  Diagnosis Date   A-fib ([place])    Arthritis    lower back   Basal cell carcinoma 10/29/2020   Right post base of skull/neck   BPH (benign prostatic hyperplasia)    Cancer (HCC)    Skin Cancer   Chronic kidney disease    Chronic Kidney Disease   Coronary artery disease    Dysrhythmia    Atrial Fibrillation; Supraventricular Tachycardia   Gout    Hearing aid worn    has, does not wear   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   L upper back 5.0cm inf to base of neck, 5.0cm lat to spine   Hyperlipidemia    Hypertension    MI, old    Nocturia    Phlebitis    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   SCC IS R foreearm distal   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   SCC IS R forearm proximal   Squamous cell carcinoma of skin 03/26/2018   SCC IS R forearm   Squamous cell carcinoma of skin 01/01/2008   SCC IS R forearm   Wears dentures    partial   Past Surgical History:  Procedure Laterality Date   cardaic stents     CARDIAC CATHETERIZATION     PTCA with Stent Placement   CARDIOVERSION N/A 04/13/2021   Procedure: CARDIOVERSION;  Surgeon: Corey Skains, MD;  Location: [facility]S;  Service: Cardiovascular;  Laterality: N/A;   CARDIOVERSION  N/A 05/05/2021   Procedure: CARDIOVERSION;  Surgeon: Corey Skains, MD;  Location: [facility]S;  Service: Cardiovascular;  Laterality: N/A;   COLONOSCOPY WITH PROPOFOL N/A 12/15/2015   Procedure: COLONOSCOPY WITH PROPOFOL;  Surgeon: Lollie Sails, MD;  Location: [facility] ENDOSCOPY;  Service: Endoscopy;  Laterality: N/A;   CORONARY ARTERY BYPASS GRAFT  1992   ECTROPION REPAIR Right 12/12/2017   Procedure: REPAIR OF ECTROPION SUTURES/EXTENSIVE RIGHT;  Surgeon: Karle Starch, MD;  Location: [place];  Service: Ophthalmology;  Laterality: Right;   HERNIA REPAIR     HIP ARTHROPLASTY Left 02/19/2022   Procedure: ARTHROPLASTY BIPOLAR HIP (HEMIARTHROPLASTY);  Surgeon: Thornton Park, MD;  Location: [facility];  Service: Orthopedics;  Laterality: Left;   IR KYPHO LUMBAR INC FX REDUCE BONE BX UNI/BIL CANNULATION INC/IMAGING  06/07/2022   IR RADIOLOGIST EVAL & MGMT  06/30/2022   SVT ABLATION  02/22/2017   [facility]   Patient Active Problem List   Diagnosis Date Noted   Acute urinary retention 02/19/2022   Acute delirium 02/18/2022   Dysphagia 02/18/2022   Hyponatremia 02/15/2022   Closed left hip fracture ([place]) 02/14/2022  Atrial fibrillation with RVR ([place]) 04/12/2021   Bradycardia 07/08/2019   Venous insufficiency of both lower extremities 07/08/2019   Abnormal glucose 01/10/2019   Elevated PSA 01/10/2019   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   Palpitations 06/13/2018   Leg edema, right 09/20/2017   History of BPH 02/21/2017   BPH (benign prostatic hyperplasia) 04/25/2016   Nocturia 04/25/2016   Gout 03/25/2016   History of colon polyps 03/25/2016   Inflammation of a vein 03/25/2016   Percutaneous transluminal coronary angioplasty status 03/25/2016   Supraventricular tachycardia ([place]) 03/25/2016   Benign essential HTN 06/10/2015   Paroxysmal atrial fibrillation ([place]) 01/20/2015   Atrial fibrillation ([place]) 05/13/2014   Coronary artery disease 05/13/2014   Chest pain 05/13/2014   Stage 3a  chronic kidney disease (CKD) ([place]) 05/13/2014   Essential (primary) hypertension 05/13/2014   History of cardiac catheterization 05/13/2014   HLD (hyperlipidemia) 05/13/2014   Combined fat and carbohydrate induced hyperlipemia 05/13/2014   History of cardiovascular surgery 05/13/2014   History of anticoagulant therapy 04/28/2014   Warfarin anticoagulation 04/28/2014   Benign prostatic hyperplasia with urinary obstruction 11/05/2013    REFERRING DIAG: LBP  THERAPY DIAG:  Other low back pain  Unsteadiness on feet  Difficulty in walking, not elsewhere classified  Rationale for Evaluation and Treatment Rehabilitation  PERTINENT HISTORY: LBP, balance. Pt fell 02/17/2022 while trying to step up onto his half step to get into his home in the garage. Pt tripped on the rug which is no longer there. Pt fractured L hip S/P L hip hemiarthroplasty on 02/19/2022. Pt underwent home health PT for the hip rehab. Pt fell again a couple of weeks after participating in rehab resulting in lumbar fracture. Does not remember what caused the fall. Pt was doing something in the hall. Pt does not remember if he was using an AD. Pt had a shot in his back which took away the pain. Pt started using his SPC a couple of weeks ago. Home health PT ended last week. Independent ambulation prior to L hip fracture.  PRECAUTIONS: fall risk,   SUBJECTIVE: Back is doing pretty good.   PAIN:  Are you having pain? 0/10     TODAY'S TREATMENT:   Therapeutic exerise   Seated transversus abdominis contraction 10x2 with 5 second holds  Then with B scapular retraction 10x5 seconds for 3 sets  Standing with B UE assist   Glute max contraction 10x5 seconds for 3 sets   Hip abduction    R 10x5 seconds, then 5x5 seconds   L 10x5 seconds, then 5x5 seconds     Give as part of HEP next visit if appropriate   -lateral side stepping 10x64f bilat    Forward step up onto 4 inch step   R 10x2   L 10x2    Improved  exercise technique, movement at target joints, use of target muscles after mod verbal, visual, tactile cues.        Response to treatment Pt tolerated session well without aggravation of symptoms.      Clinical impression Worked on improving posture, trunk and glute strength to decrease stress to low back. Also worked on [employer] max, and quad strengthening to promote ability to ambulate and negotiate obstacles with less difficulty and decrease risk for falls. Pt tolerated session well without aggravation of symptoms. Pt will benefit from continued skilled physical therapy services to decrease pain, improve strength, balance, and function.        PATIENT EDUCATION: Education details: there-ex, HEP  Person educated: Patient Education method: Explanation, Demonstration, Tactile cues, Verbal cues, and Handouts Education comprehension: verbalized understanding and returned demonstration   HOME EXERCISE PROGRAM:       Access Code: [email or web address] URL: [email or web address] Date: 07/05/2022 Prepared by: Joneen Boers   Exercises - Seated Transversus Abdominis Bracing  - 1 x daily - 7 x weekly - 3 sets - 10 reps - 5 seconds hold - Standing Gluteal Sets  - 1 x daily - 7 x weekly - 3 sets - 10 reps - 5 seconds hold     PT Short Term Goals - 07/05/22 1808       PT SHORT TERM GOAL #1   Title Pt will be independent with his initial HEP to decrease pain, improve strength, balance, and function.    Baseline Pt has started his initial HEP (07/05/2022)    Time 3    Period Weeks    Status New    Target Date 07/28/22              PT Long Term Goals - 07/05/22 1813       PT LONG TERM GOAL #1   Title Patient will have a decrease in low back pain to 4/10 or less at worst to promote ability to perform standing tasks, ambulate, and negotiate stairs with less difficulty.    Baseline 10/10 low back pain at worst for the past 3 months (07/05/2022)    Time 8    Period Weeks     Status New    Target Date 09/01/22      PT LONG TERM GOAL #2   Title Patient will improve his lumbar FOTO score by at least 10 points as a demonstration of improved function.    Baseline Lumbar spine FOTO 40 (07/05/2022)    Time 8    Period Weeks    Status New    Target Date 09/01/22      PT LONG TERM GOAL #3   Title Pt will improve his TUG score to 12 secodns or less without SPC as a demonstration of improved balance and functional mobility.    Baseline 18.33 seconds average with SPC on R side (07/05/2022)    Time 8    Period Weeks    Status New    Target Date 09/01/22      PT LONG TERM GOAL #4   Title Pt will improve bilateral bilateral hip extension and abduction to promote ability to perform standing tasks more steadily.    Baseline Hip extension 4+/5 R and L, hip abduction 4+/5 R, 4/5 L (07/05/2022)    Time 8    Period Weeks    Status New    Target Date 09/01/22              Plan - 07/12/22 1259     Clinical Impression Statement Worked on improving posture, trunk and glute strength to decrease stress to low back. Also worked on [employer], max, and quad strengthening to promote ability to ambulate and negotiate obstacles with less difficulty and decrease risk for falls. Pt tolerated session well without aggravation of symptoms. Pt will benefit from continued skilled physical therapy services to decrease pain, improve strength, balance, and function.    Personal Factors and Comorbidities Age;Comorbidity 3+;Past/Current Experience;Time since onset of injury/illness/exacerbation    Comorbidities A-fib, arthritis, basal cell CA, chronic kidney disease, dysrhythmia, HTN, MI, phlebitis, L hip hemiarthroplasty    Examination-Activity Limitations Stairs;Stand;Bend;Locomotion Level;Transfers    Stability/Clinical Decision Making Stable/Uncomplicated  Rehab Potential Fair    PT Frequency 2x / week    PT Duration 8 weeks    PT Treatment/Interventions Therapeutic  exercise;Therapeutic activities;Stair training;Functional mobility training;Balance training;Neuromuscular re-education;Patient/family education;Manual techniques;Dry needling;Aquatic Therapy;Electrical Stimulation;Iontophoresis '4mg'$ /ml Dexamethasone;Gait training    PT Next Visit Plan posture, thoracic and hip extension, trunk and hip strengthening, manual techniques, modalities PRN    PT Home Exercise Plan Medbridge Access Code: [email or web address]    Consulted and Agree with Plan of Care Patient              Joneen Boers PT, DPT   07/12/2022, 1:00 PM

## 2022-07-14 ENCOUNTER — Ambulatory Visit: Payer: Medicare Other

## 2022-07-14 DIAGNOSIS — M5459 Other low back pain: Secondary | ICD-10-CM

## 2022-07-14 DIAGNOSIS — R2681 Unsteadiness on feet: Secondary | ICD-10-CM

## 2022-07-14 DIAGNOSIS — R262 Difficulty in walking, not elsewhere classified: Secondary | ICD-10-CM

## 2022-07-14 NOTE — Therapy (Signed)
OUTPATIENT PHYSICAL THERAPY TREATMENT NOTE   Patient Name: Robert Rivas MRN: [id number] DOB:01-15-1941, 81 y.o., male Today's Date: 07/14/2022  PCP: Idelle Crouch, MD REFERRING PROVIDER: Idelle Crouch, MD   PT End of Session - 07/14/22 1502     Visit Number 4    Number of Visits 17    Date for PT Re-Evaluation 09/01/22    Authorization Type UHC Medicare Primary; Tric[employer] for Life sceondary    Authorization Time Period 07/05/22-09/01/22    PT Start Time 1502    PT Stop Time 1544    PT Time Calculation (min) 42 min    Equipment Utilized During Treatment Gait belt    Activity Tolerance Patient tolerated treatment well    Behavior During Therapy WFL for tasks assessed/performed              Past Medical History:  Diagnosis Date   A-fib ([place])    Arthritis    lower back   Basal cell carcinoma 10/29/2020   Right post base of skull/neck   BPH (benign prostatic hyperplasia)    Cancer (HCC)    Skin Cancer   Chronic kidney disease    Chronic Kidney Disease   Coronary artery disease    Dysrhythmia    Atrial Fibrillation; Supraventricular Tachycardia   Gout    Hearing aid worn    has, does not wear   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   L upper back 5.0cm inf to base of neck, 5.0cm lat to spine   Hyperlipidemia    Hypertension    MI, old    Nocturia    Phlebitis    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   SCC IS R foreearm distal   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   SCC IS R forearm proximal   Squamous cell carcinoma of skin 03/26/2018   SCC IS R forearm   Squamous cell carcinoma of skin 01/01/2008   SCC IS R forearm   Wears dentures    partial   Past Surgical History:  Procedure Laterality Date   cardaic stents     CARDIAC CATHETERIZATION     PTCA with Stent Placement   CARDIOVERSION N/A 04/13/2021   Procedure: CARDIOVERSION;  Surgeon: Corey Skains, MD;  Location: [facility] ORS;  Service: Cardiovascular;  Laterality: N/A;   CARDIOVERSION  N/A 05/05/2021   Procedure: CARDIOVERSION;  Surgeon: Corey Skains, MD;  Location: [facility];  Service: Cardiovascular;  Laterality: N/A;   COLONOSCOPY WITH PROPOFOL N/A 12/15/2015   Procedure: COLONOSCOPY WITH PROPOFOL;  Surgeon: Lollie Sails, MD;  Location: [facility] ENDOSCOPY;  Service: Endoscopy;  Laterality: N/A;   CORONARY ARTERY BYPASS GRAFT  1992   ECTROPION REPAIR Right 12/12/2017   Procedure: REPAIR OF ECTROPION SUTURES/EXTENSIVE RIGHT;  Surgeon: Karle Starch, MD;  Location: [place];  Service: Ophthalmology;  Laterality: Right;   HERNIA REPAIR     HIP ARTHROPLASTY Left 02/19/2022   Procedure: ARTHROPLASTY BIPOLAR HIP (HEMIARTHROPLASTY);  Surgeon: Thornton Park, MD;  Location: [facility] ORS;  Service: Orthopedics;  Laterality: Left;   IR KYPHO LUMBAR INC FX REDUCE BONE BX UNI/BIL CANNULATION INC/IMAGING  06/07/2022   IR RADIOLOGIST EVAL & MGMT  06/30/2022   SVT ABLATION  02/22/2017   [facility]   Patient Active Problem List   Diagnosis Date Noted   Acute urinary retention 02/19/2022   Acute delirium 02/18/2022   Dysphagia 02/18/2022   Hyponatremia 02/15/2022   Closed left hip fracture ([place]) 02/14/2022  Atrial fibrillation with RVR ([place]) 04/12/2021   Bradycardia 07/08/2019   Venous insufficiency of both lower extremities 07/08/2019   Abnormal glucose 01/10/2019   Elevated PSA 01/10/2019   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   Palpitations 06/13/2018   Leg edema, right 09/20/2017   History of BPH 02/21/2017   BPH (benign prostatic hyperplasia) 04/25/2016   Nocturia 04/25/2016   Gout 03/25/2016   History of colon polyps 03/25/2016   Inflammation of a vein 03/25/2016   Percutaneous transluminal coronary angioplasty status 03/25/2016   Supraventricular tachycardia ([place]) 03/25/2016   Benign essential HTN 06/10/2015   Paroxysmal atrial fibrillation ([place]) 01/20/2015   Atrial fibrillation ([place]) 05/13/2014   Coronary artery disease 05/13/2014   Chest pain 05/13/2014   Stage 3a  chronic kidney disease (CKD) ([place]) 05/13/2014   Essential (primary) hypertension 05/13/2014   History of cardiac catheterization 05/13/2014   HLD (hyperlipidemia) 05/13/2014   Combined fat and carbohydrate induced hyperlipemia 05/13/2014   History of cardiovascular surgery 05/13/2014   History of anticoagulant therapy 04/28/2014   Warfarin anticoagulation 04/28/2014   Benign prostatic hyperplasia with urinary obstruction 11/05/2013    REFERRING DIAG: LBP  THERAPY DIAG:  Other low back pain  Unsteadiness on feet  Difficulty in walking, not elsewhere classified  Rationale for Evaluation and Treatment Rehabilitation  PERTINENT HISTORY: LBP, balance. Pt fell 02/17/2022 while trying to step up onto his half step to get into his home in the garage. Pt tripped on the rug which is no longer there. Pt fractured L hip S/P L hip hemiarthroplasty on 02/19/2022. Pt underwent home health PT for the hip rehab. Pt fell again a couple of weeks after participating in rehab resulting in lumbar fracture. Does not remember what caused the fall. Pt was doing something in the hall. Pt does not remember if he was using an AD. Pt had a shot in his back which took away the pain. Pt started using his SPC a couple of weeks ago. Home health PT ended last week. Independent ambulation prior to L hip fracture.  PRECAUTIONS: fall risk,   SUBJECTIVE: Back is doing pretty good. Walking is doing good.   PAIN:  Are you having pain? 0/10     TODAY'S TREATMENT:   Therapeutic exerise   Seated transversus abdominis contraction with B scapular retraction 10x5 seconds for 3 sets   Standing with B UE assist     Hip abduction    R 10x5 seconds, for 2 sets   L 10x5 seconds, for 2 sets    Forward step up onto 4 inch step   R 10x2   L 10x2  Stepping over 2 mini hurdles with one UE assist 10x   Without UE assist:   Glute max contraction 10x5 seconds, CGA    Side stepping 5 ft to the R and 5 ft to the L  5x   Improved exercise technique, movement at target joints, use of target muscles after mod verbal, visual, tactile cues.        Response to treatment Pt tolerated session well without aggravation of symptoms.      Clinical impression   Continued working on improving posture, trunk and glute strength to decrease stress to low back. Also worked on obstacle and step negotiation to decrease risk for falls. Pt tolerated session well without aggravation of symptoms. Pt will benefit from continued skilled physical therapy services to decrease pain, improve strength, balance, and function.        PATIENT EDUCATION: Education details: there-ex, HEP  Person educated: Patient Education method: Explanation, Demonstration, Tactile cues, Verbal cues, and Handouts Education comprehension: verbalized understanding and returned demonstration   HOME EXERCISE PROGRAM:       Access Code: [email or web address] URL: [email or web address] Date: 07/05/2022 Prepared by: Joneen Boers   Exercises - Seated Transversus Abdominis Bracing  - 1 x daily - 7 x weekly - 3 sets - 10 reps - 5 seconds hold - Standing Gluteal Sets  - 1 x daily - 7 x weekly - 3 sets - 10 reps - 5 seconds hold     PT Short Term Goals - 07/05/22 1808       PT SHORT TERM GOAL #1   Title Pt will be independent with his initial HEP to decrease pain, improve strength, balance, and function.    Baseline Pt has started his initial HEP (07/05/2022)    Time 3    Period Weeks    Status New    Target Date 07/28/22              PT Long Term Goals - 07/05/22 1813       PT LONG TERM GOAL #1   Title Patient will have a decrease in low back pain to 4/10 or less at worst to promote ability to perform standing tasks, ambulate, and negotiate stairs with less difficulty.    Baseline 10/10 low back pain at worst for the past 3 months (07/05/2022)    Time 8    Period Weeks    Status New    Target Date 09/01/22      PT LONG  TERM GOAL #2   Title Patient will improve his lumbar FOTO score by at least 10 points as a demonstration of improved function.    Baseline Lumbar spine FOTO 40 (07/05/2022)    Time 8    Period Weeks    Status New    Target Date 09/01/22      PT LONG TERM GOAL #3   Title Pt will improve his TUG score to 12 secodns or less without SPC as a demonstration of improved balance and functional mobility.    Baseline 18.33 seconds average with SPC on R side (07/05/2022)    Time 8    Period Weeks    Status New    Target Date 09/01/22      PT LONG TERM GOAL #4   Title Pt will improve bilateral bilateral hip extension and abduction to promote ability to perform standing tasks more steadily.    Baseline Hip extension 4+/5 R and L, hip abduction 4+/5 R, 4/5 L (07/05/2022)    Time 8    Period Weeks    Status New    Target Date 09/01/22              Plan - 07/14/22 1500     Clinical Impression Statement Continued working on improving posture, trunk and glute strength to decrease stress to low back. Also worked on obstacle and step negotiation to decrease risk for falls. Pt tolerated session well without aggravation of symptoms. Pt will benefit from continued skilled physical therapy services to decrease pain, improve strength, balance, and function.    Personal Factors and Comorbidities Age;Comorbidity 3+;Past/Current Experience;Time since onset of injury/illness/exacerbation    Comorbidities A-fib, arthritis, basal cell CA, chronic kidney disease, dysrhythmia, HTN, MI, phlebitis, L hip hemiarthroplasty    Examination-Activity Limitations Stairs;Stand;Bend;Locomotion Level;Transfers    Stability/Clinical Decision Making Stable/Uncomplicated    Clinical Decision Making Low    Rehab Potential  Fair    PT Frequency 2x / week    PT Duration 8 weeks    PT Treatment/Interventions Therapeutic exercise;Therapeutic activities;Stair training;Functional mobility training;Balance training;Neuromuscular  re-education;Patient/family education;Manual techniques;Dry needling;Aquatic Therapy;Electrical Stimulation;Iontophoresis '4mg'$ /ml Dexamethasone;Gait training    PT Next Visit Plan posture, thoracic and hip extension, trunk and hip strengthening, manual techniques, modalities PRN    PT Home Exercise Plan Medbridge Access Code: [email or web address]    Consulted and Agree with Plan of Care Patient               Joneen Boers PT, DPT   07/14/2022, 3:45 PM

## 2022-07-19 ENCOUNTER — Ambulatory Visit: Payer: Medicare Other

## 2022-07-19 DIAGNOSIS — R2681 Unsteadiness on feet: Secondary | ICD-10-CM

## 2022-07-19 DIAGNOSIS — M5459 Other low back pain: Secondary | ICD-10-CM | POA: Diagnosis not present

## 2022-07-19 DIAGNOSIS — R262 Difficulty in walking, not elsewhere classified: Secondary | ICD-10-CM

## 2022-07-19 NOTE — Therapy (Signed)
OUTPATIENT PHYSICAL THERAPY TREATMENT NOTE   Patient Name: Robert Rivas MRN: [id number] DOB:[birth year], 81 y.o., male Today's Date: 07/19/2022  PCP: Idelle Crouch, MD REFERRING PROVIDER: Idelle Crouch, MD   PT End of Session - 07/19/22 1105     Visit Number 5    Number of Visits 17    Date for PT Re-Evaluation 09/01/22    Authorization Type UHC Medicare Primary; Tricare for Life sceondary    Authorization Time Period 07/05/22-09/01/22    PT Start Time 1101    PT Stop Time 1142    PT Time Calculation (min) 41 min    Equipment Utilized During Treatment Gait belt    Activity Tolerance Patient tolerated treatment well    Behavior During Therapy WFL for tasks assessed/performed              Past Medical History:  Diagnosis Date   A-fib ([place])    Arthritis    lower back   Basal cell carcinoma 10/29/2020   Right post base of skull/neck   BPH (benign prostatic hyperplasia)    Cancer (HCC)    Skin Cancer   Chronic kidney disease    Chronic Kidney Disease   Coronary artery disease    Dysrhythmia    Atrial Fibrillation; Supraventricular Tachycardia   Gout    Hearing aid worn    has, does not wear   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   L upper back 5.0cm inf to base of neck, 5.0cm lat to spine   Hyperlipidemia    Hypertension    MI, old    Nocturia    Phlebitis    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   SCC IS R foreearm distal   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   SCC IS R forearm proximal   Squamous cell carcinoma of skin 03/26/2018   SCC IS R forearm   Squamous cell carcinoma of skin 01/01/2008   SCC IS R forearm   Wears dentures    partial   Past Surgical History:  Procedure Laterality Date   cardaic stents     CARDIAC CATHETERIZATION     PTCA with Stent Placement   CARDIOVERSION N/A 04/13/2021   Procedure: CARDIOVERSION;  Surgeon: Corey Skains, MD;  Location: [facility] OR[facility];  Service: Cardiovascular;  Laterality: N/A;   CARDIOVERSION  N/A 05/05/2021   Procedure: CARDIOVERSION;  Surgeon: Corey Skains, MD;  Location: [facility] OR[facility];  Service: Cardiovascular;  Laterality: N/A;   COLONOSCOPY WITH PROPOFOL N/A 12/15/2015   Procedure: COLONOSCOPY WITH PROPOFOL;  Surgeon: Lollie Sails, MD;  Location: [facility] ENDOSCOPY;  Service: Endoscopy;  Laterality: N/A;   CORONARY ARTERY BYPASS GRAFT  1992   ECTROPION REPAIR Right 12/12/2017   Procedure: REPAIR OF ECTROPION SUTURES/EXTENSIVE RIGHT;  Surgeon: Karle Starch, MD;  Location: [place];  Service: Ophthalmology;  Laterality: Right;   HERNIA REPAIR     HIP ARTHROPLASTY Left 02/19/2022   Procedure: ARTHROPLASTY BIPOLAR HIP (HEMIARTHROPLASTY);  Surgeon: Thornton Park, MD;  Location: [facility] ORS;  Service: Orthopedics;  Laterality: Left;   IR KYPHO LUMBAR INC FX REDUCE BONE BX UNI/BIL CANNULATION INC/IMAGING  06/07/2022   IR RADIOLOGIST EVAL & MGMT  06/30/2022   SVT ABLATION  02/22/2017   [facility]   Patient Active Problem List   Diagnosis Date Noted   Acute urinary retention 02/19/2022   Acute delirium 02/18/2022   Dysphagia 02/18/2022   Hyponatremia 02/15/2022   Closed left hip fracture ([place]) 02/14/2022  Atrial fibrillation with RVR ([place]) 04/12/2021   Bradycardia 07/08/2019   Venous insufficiency of both lower extremities 07/08/2019   Abnormal glucose 01/10/2019   Elevated PSA 01/10/2019   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   Palpitations 06/13/2018   Leg edema, right 09/20/2017   History of BPH 02/21/2017   BPH (benign prostatic hyperplasia) 04/25/2016   Nocturia 04/25/2016   Gout 03/25/2016   History of colon polyps 03/25/2016   Inflammation of a vein 03/25/2016   Percutaneous transluminal coronary angioplasty status 03/25/2016   Supraventricular tachycardia ([place]) 03/25/2016   Benign essential HTN 06/10/2015   Paroxysmal atrial fibrillation ([place]) 01/20/2015   Atrial fibrillation ([place]) 05/13/2014   Coronary artery disease 05/13/2014   Chest pain 05/13/2014   Stage 3a  chronic kidney disease (CKD) ([place]) 05/13/2014   Essential (primary) hypertension 05/13/2014   History of cardiac catheterization 05/13/2014   HLD (hyperlipidemia) 05/13/2014   Combined fat and carbohydrate induced hyperlipemia 05/13/2014   History of cardiovascular surgery 05/13/2014   History of anticoagulant therapy 04/28/2014   Warfarin anticoagulation 04/28/2014   Benign prostatic hyperplasia with urinary obstruction 11/05/2013    REFERRING DIAG: LBP  THERAPY DIAG:  Other low back pain  Unsteadiness on feet  Difficulty in walking, not elsewhere classified  Rationale for Evaluation and Treatment Rehabilitation  PERTINENT HISTORY: LBP, balance. Pt fell 02/17/2022 while trying to step up onto his half step to get into his home in the garage. Pt tripped on the rug which is no longer there. Pt fractured L hip S/P L hip hemiarthroplasty on 02/19/2022. Pt underwent home health PT for the hip rehab. Pt fell again a couple of weeks after participating in rehab resulting in lumbar fracture. Does not remember what caused the fall. Pt was doing something in the hall. Pt does not remember if he was using an AD. Pt had a shot in his back which took away the pain. Pt started using his SPC a couple of weeks ago. Home health PT ended last week. Independent ambulation prior to L hip fracture.  PRECAUTIONS: fall risk,   SUBJECTIVE: Pt denies back pain or any falls. Felt good after last session, no soreness.   PAIN:  Are you having pain? 0/10     TODAY'S TREATMENT:   07/19/22 Therapeutic exercise    Resisted scap retraction with GTB: 3x12  Thoracic extension over  bolster: 2x12  STS from standard height chair: 3x8 reps. Requires light BUE support on arm rests. Decreased eccentric control.   Standing with B UE assist                                 Hip abduction: 2x12/LE, BUE support.                                 Navigation over unstable surfaces (mat on floor) CGA  throughout   Forwards x8, PRN SUE support    Lateral x8, PRN BUE support       Forwards x8 step to x2 hurdle navigation with SUE support    Lateral x8 step to x2 hurdle navigation with BUE suport   Standing hip extension with knee flexed, light BUE supprt: 2x8    Improved exercise technique, movement at target joints, use of target muscles after mod verbal, visual, tactile cues.        Response to treatment Pt tolerated session well  without aggravation of symptoms.    Clinical impression Continuing POC with focus on improving strength, gait, posture and balance. Updated HEP for progressive strengthening to LE's and postural musculature. Education provided on safe adaption and completion at home to decrease risk of falls with use of chair in sitting for scap retractions. Pt given new printout verbalizing understanding. Pt will continue to benefit from skilled PT services to progress strength, gait, and balance to reduce risk of falls.       PATIENT EDUCATION: Education details: there-ex, HEP Person educated: Patient Education method: Explanation, Demonstration, Tactile cues, Verbal cues, and Handouts Education comprehension: verbalized understanding and returned demonstration   HOME EXERCISE PROGRAM:       Access Code: [email or web address] URL: [email or web address] Date: 07/05/2022 Prepared by: Joneen Boers   Exercises - Seated Transversus Abdominis Bracing  - 1 x daily - 7 x weekly - 3 sets - 10 reps - 5 seconds hold - Standing Gluteal Sets  - 1 x daily - 7 x weekly - 3 sets - 10 reps - 5 seconds hold     PT Short Term Goals - 07/05/22 1808       PT SHORT TERM GOAL #1   Title Pt will be independent with his initial HEP to decrease pain, improve strength, balance, and function.    Baseline Pt has started his initial HEP (07/05/2022)    Time 3    Period Weeks    Status New    Target Date 07/28/22              PT Long Term Goals - 07/05/22 1813        PT LONG TERM GOAL #1   Title Patient will have a decrease in low back pain to 4/10 or less at worst to promote ability to perform standing tasks, ambulate, and negotiate stairs with less difficulty.    Baseline 10/10 low back pain at worst for the past 3 months (07/05/2022)    Time 8    Period Weeks    Status New    Target Date 09/01/22      PT LONG TERM GOAL #2   Title Patient will improve his lumbar FOTO score by at least 10 points as a demonstration of improved function.    Baseline Lumbar spine FOTO 40 (07/05/2022)    Time 8    Period Weeks    Status New    Target Date 09/01/22      PT LONG TERM GOAL #3   Title Pt will improve his TUG score to 12 secodns or less without SPC as a demonstration of improved balance and functional mobility.    Baseline 18.33 seconds average with SPC on R side (07/05/2022)    Time 8    Period Weeks    Status New    Target Date 09/01/22      PT LONG TERM GOAL #4   Title Pt will improve bilateral bilateral hip extension and abduction to promote ability to perform standing tasks more steadily.    Baseline Hip extension 4+/5 R and L, hip abduction 4+/5 R, 4/5 L (07/05/2022)    Time 8    Period Weeks    Status New    Target Date 09/01/22                    Salem Caster. Fairly IV, PT, DPT Physical Therapist- [facility]  07/19/2022, 12:34 PM

## 2022-07-21 ENCOUNTER — Ambulatory Visit: Payer: Medicare Other

## 2022-07-21 DIAGNOSIS — R262 Difficulty in walking, not elsewhere classified: Secondary | ICD-10-CM

## 2022-07-21 DIAGNOSIS — M5459 Other low back pain: Secondary | ICD-10-CM | POA: Diagnosis not present

## 2022-07-21 DIAGNOSIS — R2681 Unsteadiness on feet: Secondary | ICD-10-CM

## 2022-07-21 NOTE — Therapy (Signed)
OUTPATIENT PHYSICAL THERAPY TREATMENT NOTE   Patient Name: Robert Rivas MRN: [id number] DOB:12-20-1941, 81 y.o., male Today's Date: 07/21/2022  PCP: Idelle Crouch, MD REFERRING PROVIDER: Idelle Crouch, MD   PT End of Session - 07/21/22 1019     Visit Number 6    Number of Visits 17    Date for PT Re-Evaluation 09/01/22    Authorization Type UHC Medicare Primary; Tricare for Life sceondary    Authorization Time Period 07/05/22-09/01/22    PT Start Time 1020    PT Stop Time 1100    PT Time Calculation (min) 40 min    Equipment Utilized During Treatment Gait belt    Activity Tolerance Patient tolerated treatment well    Behavior During Therapy WFL for tasks assessed/performed               Past Medical History:  Diagnosis Date   A-fib ([place])    Arthritis    lower back   Basal cell carcinoma 10/29/2020   Right post base of skull/neck   BPH (benign prostatic hyperplasia)    Cancer (HCC)    Skin Cancer   Chronic kidney disease    Chronic Kidney Disease   Coronary artery disease    Dysrhythmia    Atrial Fibrillation; Supraventricular Tachycardia   Gout    Hearing aid worn    has, does not wear   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   L upper back 5.0cm inf to base of neck, 5.0cm lat to spine   Hyperlipidemia    Hypertension    MI, old    Nocturia    Phlebitis    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   SCC IS R foreearm distal   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   SCC IS R forearm proximal   Squamous cell carcinoma of skin 03/26/2018   SCC IS R forearm   Squamous cell carcinoma of skin 01/01/2008   SCC IS R forearm   Wears dentures    partial   Past Surgical History:  Procedure Laterality Date   cardaic stents     CARDIAC CATHETERIZATION     PTCA with Stent Placement   CARDIOVERSION N/A 04/13/2021   Procedure: CARDIOVERSION;  Surgeon: Corey Skains, MD;  Location: [facility];  Service: Cardiovascular;  Laterality: N/A;    CARDIOVERSION N/A 05/05/2021   Procedure: CARDIOVERSION;  Surgeon: Corey Skains, MD;  Location: [facility];  Service: Cardiovascular;  Laterality: N/A;   COLONOSCOPY WITH PROPOFOL N/A 12/15/2015   Procedure: COLONOSCOPY WITH PROPOFOL;  Surgeon: Lollie Sails, MD;  Location: [facility] ENDOSCOPY;  Service: Endoscopy;  Laterality: N/A;   CORONARY ARTERY BYPASS GRAFT  1992   ECTROPION REPAIR Right 12/12/2017   Procedure: REPAIR OF ECTROPION SUTURES/EXTENSIVE RIGHT;  Surgeon: Karle Starch, MD;  Location: [place];  Service: Ophthalmology;  Laterality: Right;   HERNIA REPAIR     HIP ARTHROPLASTY Left 02/19/2022   Procedure: ARTHROPLASTY BIPOLAR HIP (HEMIARTHROPLASTY);  Surgeon: Thornton Park, MD;  Location: [facility];  Service: Orthopedics;  Laterality: Left;   IR KYPHO LUMBAR INC FX REDUCE BONE BX UNI/BIL CANNULATION INC/IMAGING  06/07/2022   IR RADIOLOGIST EVAL & MGMT  06/30/2022   SVT ABLATION  02/22/2017   [facility]   Patient Active Problem List   Diagnosis Date Noted   Acute urinary retention 02/19/2022   Acute delirium 02/18/2022   Dysphagia 02/18/2022   Hyponatremia 02/15/2022   Closed left hip fracture (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   Palpitations 06/13/2018   Leg edema, right 09/20/2017   History of BPH 02/21/2017   BPH (benign prostatic hyperplasia) 04/25/2016   Nocturia 04/25/2016   Gout 03/25/2016   History of colon polyps 03/25/2016   Inflammation of a vein 03/25/2016   Percutaneous transluminal coronary angioplasty status 03/25/2016   Supraventricular tachycardia ([place]) 03/25/2016   Benign essential HTN 06/10/2015   Paroxysmal atrial fibrillation ([place]) 01/20/2015   Atrial fibrillation ([place]) 05/13/2014   Coronary artery disease 05/13/2014   Chest pain 05/13/2014    Stage 3a chronic kidney disease (CKD) ([place]) 05/13/2014   Essential (primary) hypertension 05/13/2014   History of cardiac catheterization 05/13/2014   HLD (hyperlipidemia) 05/13/2014   Combined fat and carbohydrate induced hyperlipemia 05/13/2014   History of cardiovascular surgery 05/13/2014   History of anticoagulant therapy 04/28/2014   Warfarin anticoagulation 04/28/2014   Benign prostatic hyperplasia with urinary obstruction 11/05/2013    REFERRING DIAG: LBP  THERAPY DIAG:  Other low back pain  Unsteadiness on feet  Difficulty in walking, not elsewhere classified  Rationale for Evaluation and Treatment Rehabilitation  PERTINENT HISTORY: LBP, balance. Pt fell 02/17/2022 while trying to step up onto his half step to get into his home in the garage. Pt tripped on the rug which is no longer there. Pt fractured L hip S/P L hip hemiarthroplasty on 02/19/2022. Pt underwent home health PT for the hip rehab. Pt fell again a couple of weeks after participating in rehab resulting in lumbar fracture. Does not remember what caused the fall. Pt was doing something in the hall. Pt does not remember if he was using an AD. Pt had a shot in his back which took away the pain. Pt started using his SPC a couple of weeks ago. Home health PT ended last week. Independent ambulation prior to L hip fracture.  PRECAUTIONS: fall risk,   SUBJECTIVE: No back pain. The HEP added last session (07/19/2022) per pt: sit <> stand, stepping over obstacles, walking forward and back  PAIN:  Are you having pain? 0/10     TODAY'S TREATMENT:   Therapeutic exerise   STS from standard height chair, BUE assist with eccentric lowering 10x, then 6x  SLS with B UE assist   R 5x5 seconds for 2 sets  L 5x5 seconds for 2 sets  Stepping over 3 mini hurdles with one UE assist and PT CGA  R 4x2  L 4x2  Forward step up onto and over Air Ex pad with one UE assist   R 5x  L 5x. Decreased L quadriceps strength compared to  R when stepping onto Air Ex pad  Seated LAQ, slow lowering  L 5 lbs 10x, then 10x5 second holds for 2 sets  Standing with B UE assist     Hip abduction    R 10x5 seconds   L 10x5 seconds                 Standing hip extension with knee flexed, light BUE supprt: 10x each LE   Gait from gym to car with SPC, cues for increasing step length. Improved steadiness in gait observed.    Improved exercise technique, movement at target joints, use of target muscles after mod verbal, visual, tactile cues.     Response to treatment Pt  tolerated session well without aggravation of symptoms.      Clinical impression Worked on gute strengthening to promote single leg stance strength for obstacle negotiation. Also worked on stepping over obstacles and onto and over uneven surface to improve balance and decrease fall risk. Difficulty with L single leg stance strength secondary to glute weakness and hx of L hip arthroplasty. No reports of back pain throughout session. Pt will benefit from continued skilled physical therapy services to decrease pain, improve strength, balance, and function.        PATIENT EDUCATION: Education details: there-ex, HEP Person educated: Patient Education method: Explanation, Demonstration, Tactile cues, Verbal cues, and Handouts Education comprehension: verbalized understanding and returned demonstration   HOME EXERCISE PROGRAM:       Access Code: [email or web address] URL: [email or web address] Date: 07/05/2022 Prepared by: Joneen Boers    Exercises - Standing Hip Abduction with Counter Support  - 2 x daily - 7 x weekly - 1 sets - 10 reps - 5 seconds hold - Sit to Stand with Armchair  - 1 x daily - 3 x weekly - 2 sets - 8 reps - Scapular Retraction with Resistance  - 1 x daily - 3 x weekly - 3 sets - 12 reps The HEP added last session (07/19/2022) per pt: sit <> stand, stepping over obstacles, walking forward and back    PT Short Term Goals - 07/05/22  1808       PT SHORT TERM GOAL #1   Title Pt will be independent with his initial HEP to decrease pain, improve strength, balance, and function.    Baseline Pt has started his initial HEP (07/05/2022)    Time 3    Period Weeks    Status New    Target Date 07/28/22              PT Long Term Goals - 07/05/22 1813       PT LONG TERM GOAL #1   Title Patient will have a decrease in low back pain to 4/10 or less at worst to promote ability to perform standing tasks, ambulate, and negotiate stairs with less difficulty.    Baseline 10/10 low back pain at worst for the past 3 months (07/05/2022)    Time 8    Period Weeks    Status New    Target Date 09/01/22      PT LONG TERM GOAL #2   Title Patient will improve his lumbar FOTO score by at least 10 points as a demonstration of improved function.    Baseline Lumbar spine FOTO 40 (07/05/2022)    Time 8    Period Weeks    Status New    Target Date 09/01/22      PT LONG TERM GOAL #3   Title Pt will improve his TUG score to 12 secodns or less without SPC as a demonstration of improved balance and functional mobility.    Baseline 18.33 seconds average with SPC on R side (07/05/2022)    Time 8    Period Weeks    Status New    Target Date 09/01/22      PT LONG TERM GOAL #4   Title Pt will improve bilateral bilateral hip extension and abduction to promote ability to perform standing tasks more steadily.    Baseline Hip extension 4+/5 R and L, hip abduction 4+/5 R, 4/5 L (07/05/2022)    Time 8    Period Weeks    Status New  Target Date 09/01/22              Plan - 07/21/22 1249     Clinical Impression Statement Worked on gute strengthening to promote single leg stance strength for obstacle negotiation. Also worked on stepping over obstacles and onto and over uneven surface to improve balance and decrease fall risk. Difficulty with L single leg stance strength secondary to glute weakness and hx of L hip arthroplasty. No reports of  back pain throughout session. Pt will benefit from continued skilled physical therapy services to decrease pain, improve strength, balance, and function.    Personal Factors and Comorbidities Age;Comorbidity 3+;Past/Current Experience;Time since onset of injury/illness/exacerbation    Comorbidities A-fib, arthritis, basal cell CA, chronic kidney disease, dysrhythmia, HTN, MI, phlebitis, L hip hemiarthroplasty    Examination-Activity Limitations Stairs;Stand;Bend;Locomotion Level;Transfers    Stability/Clinical Decision Making Stable/Uncomplicated    Rehab Potential Fair    PT Frequency 2x / week    PT Duration 8 weeks    PT Treatment/Interventions Therapeutic exercise;Therapeutic activities;Stair training;Functional mobility training;Balance training;Neuromuscular re-education;Patient/family education;Manual techniques;Dry needling;Aquatic Therapy;Electrical Stimulation;Iontophoresis '4mg'$ /ml Dexamethasone;Gait training    PT Next Visit Plan posture, thoracic and hip extension, trunk and hip strengthening, manual techniques, modalities PRN    PT Home Exercise Plan Medbridge Access Code: [email or web address]    Consulted and Agree with Plan of Care Patient                Joneen Boers PT, DPT   07/21/2022, 1:03 PM

## 2022-08-01 ENCOUNTER — Ambulatory Visit: Payer: Medicare Other | Attending: Internal Medicine

## 2022-08-01 DIAGNOSIS — M5459 Other low back pain: Secondary | ICD-10-CM | POA: Diagnosis present

## 2022-08-01 DIAGNOSIS — R2681 Unsteadiness on feet: Secondary | ICD-10-CM | POA: Diagnosis present

## 2022-08-01 DIAGNOSIS — R262 Difficulty in walking, not elsewhere classified: Secondary | ICD-10-CM | POA: Insufficient documentation

## 2022-08-01 NOTE — Therapy (Signed)
OUTPATIENT PHYSICAL THERAPY TREATMENT NOTE   Patient Name: Robert Rivas MRN: [id number] DOB:1941/10/23, 81 y.o., male Today's Date: 08/01/2022  PCP: Idelle Crouch, MD REFERRING PROVIDER: Idelle Crouch, MD   PT End of Session - 08/01/22 1457     Visit Number 7    Number of Visits 17    Date for PT Re-Evaluation 09/01/22    Authorization Type UHC Medicare Primary; Tricare for Life sceondary    Authorization Time Period 07/05/22-09/01/22    PT Start Time 1459    PT Stop Time 1541    PT Time Calculation (min) 42 min    Equipment Utilized During Treatment Gait belt    Activity Tolerance Patient tolerated treatment well    Behavior During Therapy WFL for tasks assessed/performed                Past Medical History:  Diagnosis Date   A-fib ([place])    Arthritis    lower back   Basal cell carcinoma 10/29/2020   Right post base of skull/neck   BPH (benign prostatic hyperplasia)    Cancer (HCC)    Skin Cancer   Chronic kidney disease    Chronic Kidney Disease   Coronary artery disease    Dysrhythmia    Atrial Fibrillation; Supraventricular Tachycardia   Gout    Hearing aid worn    has, does not wear   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   L upper back 5.0cm inf to base of neck, 5.0cm lat to spine   Hyperlipidemia    Hypertension    MI, old    Nocturia    Phlebitis    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   SCC IS R foreearm distal   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   SCC IS R forearm proximal   Squamous cell carcinoma of skin 03/26/2018   SCC IS R forearm   Squamous cell carcinoma of skin 01/01/2008   SCC IS R forearm   Wears dentures    partial   Past Surgical History:  Procedure Laterality Date   cardaic stents     CARDIAC CATHETERIZATION     PTCA with Stent Placement   CARDIOVERSION N/A 04/13/2021   Procedure: CARDIOVERSION;  Surgeon: Corey Skains, MD;  Location: [facility] ORS;  Service: Cardiovascular;  Laterality: N/A;    CARDIOVERSION N/A 05/05/2021   Procedure: CARDIOVERSION;  Surgeon: Corey Skains, MD;  Location: [facility];  Service: Cardiovascular;  Laterality: N/A;   COLONOSCOPY WITH PROPOFOL N/A 12/15/2015   Procedure: COLONOSCOPY WITH PROPOFOL;  Surgeon: Lollie Sails, MD;  Location: [facility] ENDOSCOPY;  Service: Endoscopy;  Laterality: N/A;   CORONARY ARTERY BYPASS GRAFT  1992   ECTROPION REPAIR Right 12/12/2017   Procedure: REPAIR OF ECTROPION SUTURES/EXTENSIVE RIGHT;  Surgeon: Karle Starch, MD;  Location: [place];  Service: Ophthalmology;  Laterality: Right;   HERNIA REPAIR     HIP ARTHROPLASTY Left 02/19/2022   Procedure: ARTHROPLASTY BIPOLAR HIP (HEMIARTHROPLASTY);  Surgeon: Thornton Park, MD;  Location: [facility];  Service: Orthopedics;  Laterality: Left;   IR KYPHO LUMBAR INC FX REDUCE BONE BX UNI/BIL CANNULATION INC/IMAGING  06/07/2022   IR RADIOLOGIST EVAL & MGMT  06/30/2022   SVT ABLATION  02/22/2017   [facility]   Patient Active Problem List   Diagnosis Date Noted   Acute urinary retention 02/19/2022   Acute delirium 02/18/2022   Dysphagia 02/18/2022   Hyponatremia 02/15/2022   Closed left hip fracture ([place])  02/14/2022   Atrial fibrillation with RVR ([place]) 04/12/2021   Bradycardia 07/08/2019   Venous insufficiency of both lower extremities 07/08/2019   Abnormal glucose 01/10/2019   Elevated PSA 01/10/2019   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   Palpitations 06/13/2018   Leg edema, right 09/20/2017   History of BPH 02/21/2017   BPH (benign prostatic hyperplasia) 04/25/2016   Nocturia 04/25/2016   Gout 03/25/2016   History of colon polyps 03/25/2016   Inflammation of a vein 03/25/2016   Percutaneous transluminal coronary angioplasty status 03/25/2016   Supraventricular tachycardia ([place]) 03/25/2016   Benign essential HTN 06/10/2015   Paroxysmal atrial fibrillation ([place]) 01/20/2015   Atrial fibrillation ([place]) 05/13/2014   Coronary artery disease 05/13/2014   Chest pain 05/13/2014    Stage 3a chronic kidney disease (CKD) ([place]) 05/13/2014   Essential (primary) hypertension 05/13/2014   History of cardiac catheterization 05/13/2014   HLD (hyperlipidemia) 05/13/2014   Combined fat and carbohydrate induced hyperlipemia 05/13/2014   History of cardiovascular surgery 05/13/2014   History of anticoagulant therapy 04/28/2014   Warfarin anticoagulation 04/28/2014   Benign prostatic hyperplasia with urinary obstruction 11/05/2013    REFERRING DIAG: LBP  THERAPY DIAG:  Other low back pain  Unsteadiness on feet  Difficulty in walking, not elsewhere classified  Rationale for Evaluation and Treatment Rehabilitation  PERTINENT HISTORY: LBP, balance. Pt fell 02/17/2022 while trying to step up onto his half step to get into his home in the garage. Pt tripped on the rug which is no longer there. Pt fractured L hip S/P L hip hemiarthroplasty on 02/19/2022. Pt underwent home health PT for the hip rehab. Pt fell again a couple of weeks after participating in rehab resulting in lumbar fracture. Does not remember what caused the fall. Pt was doing something in the hall. Pt does not remember if he was using an AD. Pt had a shot in his back which took away the pain. Pt started using his SPC a couple of weeks ago. Home health PT ended last week. Independent ambulation prior to L hip fracture.  PRECAUTIONS: fall risk,   SUBJECTIVE: No back pain. Was not able to do his HEP [place]. Has been practicing walking.   PAIN:  Are you having pain? 0/10     TODAY'S TREATMENT:   Therapeutic exerise   Gait with SPC, cues for increasing step length throughout session.   STS from standard height chair, BUE assist with eccentric lowering 7x, then 10x  Forward step up onto and over Air Ex pad with one UE assist   R 10x  L 10x.   SLS with B UE assist   R 10x5 seconds for 2 sets  L 10x5 seconds for 2 sets  Forward step up onto and off first regular step with  B UE assist   R 10x2  L 10x2  Stepping over 3 mini hurdles with one UE assist and PT CGA 10x    Standing with B UE assist     Hip abduction    R 5x5 seconds for 2 sets   L 10x5 seconds    Gait from gym to car with SPC, cues for increasing step length. Improved steadiness in gait observed.    Improved exercise technique, movement at target joints, use of target muscles after mod verbal, visual, tactile cues.     Response to treatment Pt tolerated session well without aggravation of symptoms.      Clinical impression Continued working on [employer]  and quadriceps strengthening to promote single leg stance strength for obstacle negotiation. Also worked on stepping over obstacles and onto and over uneven surface to improve balance and decrease fall risk. Improved steadiness with gait after cues for increasing R LE step length. Pt tolerated session well without aggravation of symptoms. Pt will benefit from continued skilled physical therapy services to decrease pain, improve strength, balance, and function.        PATIENT EDUCATION: Education details: there-ex, HEP Person educated: Patient Education method: Explanation, Demonstration, Tactile cues, Verbal cues, and Handouts Education comprehension: verbalized understanding and returned demonstration   HOME EXERCISE PROGRAM:       Access Code: [email or web address] URL: [email or web address] Date: 07/05/2022 Prepared by: Joneen Boers    Exercises - Standing Hip Abduction with Counter Support  - 2 x daily - 7 x weekly - 1 sets - 10 reps - 5 seconds hold - Sit to Stand with Armchair  - 1 x daily - 3 x weekly - 2 sets - 8 reps - Scapular Retraction with Resistance  - 1 x daily - 3 x weekly - 3 sets - 12 reps The HEP added last session (07/19/2022) per pt: sit <> stand, stepping over obstacles, walking forward and back    PT Short Term Goals - 07/05/22 1808       PT SHORT TERM GOAL #1   Title Pt will be independent with  his initial HEP to decrease pain, improve strength, balance, and function.    Baseline Pt has started his initial HEP (07/05/2022)    Time 3    Period Weeks    Status New    Target Date 07/28/22              PT Long Term Goals - 07/05/22 1813       PT LONG TERM GOAL #1   Title Patient will have a decrease in low back pain to 4/10 or less at worst to promote ability to perform standing tasks, ambulate, and negotiate stairs with less difficulty.    Baseline 10/10 low back pain at worst for the past 3 months (07/05/2022)    Time 8    Period Weeks    Status New    Target Date 09/01/22      PT LONG TERM GOAL #2   Title Patient will improve his lumbar FOTO score by at least 10 points as a demonstration of improved function.    Baseline Lumbar spine FOTO 40 (07/05/2022)    Time 8    Period Weeks    Status New    Target Date 09/01/22      PT LONG TERM GOAL #3   Title Pt will improve his TUG score to 12 secodns or less without SPC as a demonstration of improved balance and functional mobility.    Baseline 18.33 seconds average with SPC on R side (07/05/2022)    Time 8    Period Weeks    Status New    Target Date 09/01/22      PT LONG TERM GOAL #4   Title Pt will improve bilateral bilateral hip extension and abduction to promote ability to perform standing tasks more steadily.    Baseline Hip extension 4+/5 R and L, hip abduction 4+/5 R, 4/5 L (07/05/2022)    Time 8    Period Weeks    Status New    Target Date 09/01/22              Plan - 08/01/22  1456     Clinical Impression Statement Continued working on gute and quadriceps strengthening to promote single leg stance strength for obstacle negotiation. Also worked on stepping over obstacles and onto and over uneven surface to improve balance and decrease fall risk. Improved steadiness with gait after cues for increasing R LE step length. Pt tolerated session well without aggravation of symptoms. Pt will benefit from continued  skilled physical therapy services to decrease pain, improve strength, balance, and function.    Personal Factors and Comorbidities Age;Comorbidity 3+;Past/Current Experience;Time since onset of injury/illness/exacerbation    Comorbidities A-fib, arthritis, basal cell CA, chronic kidney disease, dysrhythmia, HTN, MI, phlebitis, L hip hemiarthroplasty    Examination-Activity Limitations Stairs;Stand;Bend;Locomotion Level;Transfers    Stability/Clinical Decision Making Stable/Uncomplicated    Clinical Decision Making Low    Rehab Potential Fair    PT Frequency 2x / week    PT Duration 8 weeks    PT Treatment/Interventions Therapeutic exercise;Therapeutic activities;Stair training;Functional mobility training;Balance training;Neuromuscular re-education;Patient/family education;Manual techniques;Dry needling;Aquatic Therapy;Electrical Stimulation;Iontophoresis '4mg'$ /ml Dexamethasone;Gait training    PT Next Visit Plan posture, thoracic and hip extension, trunk and hip strengthening, manual techniques, modalities PRN    PT Home Exercise Plan Medbridge Access Code: [email or web address]    Consulted and Agree with Plan of Care Patient                 Joneen Boers PT, DPT   08/01/2022, 3:44 PM

## 2022-08-03 ENCOUNTER — Ambulatory Visit: Payer: Medicare Other

## 2022-08-03 DIAGNOSIS — R262 Difficulty in walking, not elsewhere classified: Secondary | ICD-10-CM

## 2022-08-03 DIAGNOSIS — M5459 Other low back pain: Secondary | ICD-10-CM | POA: Diagnosis not present

## 2022-08-03 DIAGNOSIS — R2681 Unsteadiness on feet: Secondary | ICD-10-CM

## 2022-08-03 NOTE — Therapy (Signed)
OUTPATIENT PHYSICAL THERAPY TREATMENT NOTE   Patient Name: Robert Rivas MRN: [id number] DOB:10-31-1941, 81 y.o., male Today's Date: 08/03/2022  PCP: Idelle Crouch, MD REFERRING PROVIDER: Idelle Crouch, MD   PT End of Session - 08/03/22 0930     Visit Number 8    Number of Visits 17    Date for PT Re-Evaluation 09/01/22    Authorization Type UHC Medicare Primary; Tricare for Life sceondary    Authorization Time Period 07/05/22-09/01/22    PT Start Time 0930    PT Stop Time 1008    PT Time Calculation (min) 38 min    Equipment Utilized During Treatment Gait belt    Activity Tolerance Patient tolerated treatment well    Behavior During Therapy WFL for tasks assessed/performed                 Past Medical History:  Diagnosis Date   A-fib ([place])    Arthritis    lower back   Basal cell carcinoma 10/29/2020   Right post base of skull/neck   BPH (benign prostatic hyperplasia)    Cancer (HCC)    Skin Cancer   Chronic kidney disease    Chronic Kidney Disease   Coronary artery disease    Dysrhythmia    Atrial Fibrillation; Supraventricular Tachycardia   Gout    Hearing aid worn    has, does not wear   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   L upper back 5.0cm inf to base of neck, 5.0cm lat to spine   Hyperlipidemia    Hypertension    MI, old    Nocturia    Phlebitis    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   SCC IS R foreearm distal   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   SCC IS R forearm proximal   Squamous cell carcinoma of skin 03/26/2018   SCC IS R forearm   Squamous cell carcinoma of skin 01/01/2008   SCC IS R forearm   Wears dentures    partial   Past Surgical History:  Procedure Laterality Date   cardaic stents     CARDIAC CATHETERIZATION     PTCA with Stent Placement   CARDIOVERSION N/A 04/13/2021   Procedure: CARDIOVERSION;  Surgeon: Corey Skains, MD;  Location: [facility];  Service: Cardiovascular;  Laterality: N/A;    CARDIOVERSION N/A 05/05/2021   Procedure: CARDIOVERSION;  Surgeon: Corey Skains, MD;  Location: [facility];  Service: Cardiovascular;  Laterality: N/A;   COLONOSCOPY WITH PROPOFOL N/A 12/15/2015   Procedure: COLONOSCOPY WITH PROPOFOL;  Surgeon: Lollie Sails, MD;  Location: [facility];  Service: Endoscopy;  Laterality: N/A;   CORONARY ARTERY BYPASS GRAFT  1992   ECTROPION REPAIR Right 12/12/2017   Procedure: REPAIR OF ECTROPION SUTURES/EXTENSIVE RIGHT;  Surgeon: Karle Starch, MD;  Location: [place];  Service: Ophthalmology;  Laterality: Right;   HERNIA REPAIR     HIP ARTHROPLASTY Left 02/19/2022   Procedure: ARTHROPLASTY BIPOLAR HIP (HEMIARTHROPLASTY);  Surgeon: Thornton Park, MD;  Location: [facility];  Service: Orthopedics;  Laterality: Left;   IR KYPHO LUMBAR INC FX REDUCE BONE BX UNI/BIL CANNULATION INC/IMAGING  06/07/2022   IR RADIOLOGIST EVAL & MGMT  06/30/2022   SVT ABLATION  02/22/2017   [facility]   Patient Active Problem List   Diagnosis Date Noted   Acute urinary retention 02/19/2022   Acute delirium 02/18/2022   Dysphagia 02/18/2022   Hyponatremia 02/15/2022   Closed left hip fracture (  [place]) 02/14/2022   Atrial fibrillation with RVR ([place]) 04/12/2021   Bradycardia 07/08/2019   Venous insufficiency of both lower extremities 07/08/2019   Abnormal glucose 01/10/2019   Elevated PSA 01/10/2019   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   Palpitations 06/13/2018   Leg edema, right 09/20/2017   History of BPH 02/21/2017   BPH (benign prostatic hyperplasia) 04/25/2016   Nocturia 04/25/2016   Gout 03/25/2016   History of colon polyps 03/25/2016   Inflammation of a vein 03/25/2016   Percutaneous transluminal coronary angioplasty status 03/25/2016   Supraventricular tachycardia ([place]) 03/25/2016   Benign essential HTN 06/10/2015   Paroxysmal atrial fibrillation ([place]) 01/20/2015   Atrial fibrillation ([place]) 05/13/2014   Coronary artery disease 05/13/2014   Chest pain 05/13/2014    Stage 3a chronic kidney disease (CKD) ([place]) 05/13/2014   Essential (primary) hypertension 05/13/2014   History of cardiac catheterization 05/13/2014   HLD (hyperlipidemia) 05/13/2014   Combined fat and carbohydrate induced hyperlipemia 05/13/2014   History of cardiovascular surgery 05/13/2014   History of anticoagulant therapy 04/28/2014   Warfarin anticoagulation 04/28/2014   Benign prostatic hyperplasia with urinary obstruction 11/05/2013    REFERRING DIAG: LBP  THERAPY DIAG:  Other low back pain  Unsteadiness on feet  Difficulty in walking, not elsewhere classified  Rationale for Evaluation and Treatment Rehabilitation  PERTINENT HISTORY: LBP, balance. Pt fell 02/17/2022 while trying to step up onto his half step to get into his home in the garage. Pt tripped on the rug which is no longer there. Pt fractured L hip S/P L hip hemiarthroplasty on 02/19/2022. Pt underwent home health PT for the hip rehab. Pt fell again a couple of weeks after participating in rehab resulting in lumbar fracture. Does not remember what caused the fall. Pt was doing something in the hall. Pt does not remember if he was using an AD. Pt had a shot in his back which took away the pain. Pt started using his SPC a couple of weeks ago. Home health PT ended last week. Independent ambulation prior to L hip fracture.  PRECAUTIONS: fall risk,   SUBJECTIVE: No back pain. Working on increasing R LE step length. Makes a difference.   PAIN:  Are you having pain? 0/10     TODAY'S TREATMENT:   Therapeutic exerise     STS from standard height chair, BUE assist with eccentric lowering 10x, then 8x  SLS with B UE assist   R 10x5 seconds, then 8x5 seconds  L 10x5 seconds, then 8x5 seconds  R hip discomfort, eases with rest.    Standing four square step exercise, with use of SPC PRN.  CGA  4x2   Then with 2 SPC in a cross pattern 4x  Stepping over and back 2 mini hurdles with one UE assist 5x2   Gait  from gym to car with [facility], cues for increasing step length. Improved steadiness in gait observed.    Improved exercise technique, movement at target joints, use of target muscles after mod verbal, visual, tactile cues.     Response to treatment Pt tolerated session well without aggravation of symptoms.      Clinical impression  Worked on obstacle negotiation, cues for increasing hip and knee flexion as well as step length to promote foot clearance, as well as to place and maintain his center of gravity over base of support to improve balance and decrease fall risk. Improved ability to clear obstacles after cues. Continued working on [employer] and quadriceps strengthening to promote single  leg stance strength for obstacle negotiation. Pt tolerated session well without aggravation of symptoms. Pt will benefit from continued skilled physical therapy services to decrease pain, improve strength, balance, and function.        PATIENT EDUCATION: Education details: there-ex, HEP Person educated: Patient Education method: Explanation, Demonstration, Tactile cues, Verbal cues, and Handouts Education comprehension: verbalized understanding and returned demonstration   HOME EXERCISE PROGRAM:       Access Code: [email or web address] URL: [email or web address] Date: 07/05/2022 Prepared by: Joneen Boers    Exercises - Standing Hip Abduction with Counter Support  - 2 x daily - 7 x weekly - 1 sets - 10 reps - 5 seconds hold - Sit to Stand with Armchair  - 1 x daily - 3 x weekly - 2 sets - 8 reps - Scapular Retraction with Resistance  - 1 x daily - 3 x weekly - 3 sets - 12 reps The HEP added last session (07/19/2022) per pt: sit <> stand, stepping over obstacles, walking forward and back    PT Short Term Goals - 07/05/22 1808       PT SHORT TERM GOAL #1   Title Pt will be independent with his initial HEP to decrease pain, improve strength, balance, and function.    Baseline Pt has started his  initial HEP (07/05/2022)    Time 3    Period Weeks    Status New    Target Date 07/28/22              PT Long Term Goals - 07/05/22 1813       PT LONG TERM GOAL #1   Title Patient will have a decrease in low back pain to 4/10 or less at worst to promote ability to perform standing tasks, ambulate, and negotiate stairs with less difficulty.    Baseline 10/10 low back pain at worst for the past 3 months (07/05/2022)    Time 8    Period Weeks    Status New    Target Date 09/01/22      PT LONG TERM GOAL #2   Title Patient will improve his lumbar FOTO score by at least 10 points as a demonstration of improved function.    Baseline Lumbar spine FOTO 40 (07/05/2022)    Time 8    Period Weeks    Status New    Target Date 09/01/22      PT LONG TERM GOAL #3   Title Pt will improve his TUG score to 12 secodns or less without SPC as a demonstration of improved balance and functional mobility.    Baseline 18.33 seconds average with SPC on R side (07/05/2022)    Time 8    Period Weeks    Status New    Target Date 09/01/22      PT LONG TERM GOAL #4   Title Pt will improve bilateral bilateral hip extension and abduction to promote ability to perform standing tasks more steadily.    Baseline Hip extension 4+/5 R and L, hip abduction 4+/5 R, 4/5 L (07/05/2022)    Time 8    Period Weeks    Status New    Target Date 09/01/22              Plan - 08/03/22 0929     Clinical Impression Statement Worked on obstacle negotiation, cues for increasing hip and knee flexion as well as step length to promote foot clearance, as well as to place and maintain his center  of gravity over base of support to improve balance and decrease fall risk. Improved ability to clear obstacles after cues. Continued working on [employer] and quadriceps strengthening to promote single leg stance strength for obstacle negotiation. Pt tolerated session well without aggravation of symptoms. Pt will benefit from continued  skilled physical therapy services to decrease pain, improve strength, balance, and function.    Personal Factors and Comorbidities Age;Comorbidity 3+;Past/Current Experience;Time since onset of injury/illness/exacerbation    Comorbidities A-fib, arthritis, basal cell CA, chronic kidney disease, dysrhythmia, HTN, MI, phlebitis, L hip hemiarthroplasty    Examination-Activity Limitations Stairs;Stand;Bend;Locomotion Level;Transfers    Stability/Clinical Decision Making Stable/Uncomplicated    Clinical Decision Making Low    Rehab Potential Fair    PT Frequency 2x / week    PT Duration 8 weeks    PT Treatment/Interventions Therapeutic exercise;Therapeutic activities;Stair training;Functional mobility training;Balance training;Neuromuscular re-education;Patient/family education;Manual techniques;Dry needling;Aquatic Therapy;Electrical Stimulation;Iontophoresis '4mg'$ /ml Dexamethasone;Gait training    PT Next Visit Plan posture, thoracic and hip extension, trunk and hip strengthening, manual techniques, modalities PRN    PT Home Exercise Plan Medbridge Access Code: [email or web address]    Consulted and Agree with Plan of Care Patient                  Joneen Boers PT, DPT   08/03/2022, 10:12 AM

## 2022-08-08 ENCOUNTER — Ambulatory Visit: Payer: Medicare Other

## 2022-08-08 DIAGNOSIS — R262 Difficulty in walking, not elsewhere classified: Secondary | ICD-10-CM

## 2022-08-08 DIAGNOSIS — M5459 Other low back pain: Secondary | ICD-10-CM

## 2022-08-08 DIAGNOSIS — R2681 Unsteadiness on feet: Secondary | ICD-10-CM

## 2022-08-08 NOTE — Therapy (Signed)
OUTPATIENT PHYSICAL THERAPY TREATMENT NOTE   Patient Name: Robert Rivas MRN: [id number] DOB:March 01, 1941, 81 y.o., male Today's Date: 08/08/2022  PCP: Idelle Crouch, MD REFERRING PROVIDER: Idelle Crouch, MD   PT End of Session - 08/08/22 0937     Visit Number 9    Number of Visits 17    Date for PT Re-Evaluation 09/01/22    Authorization Type UHC Medicare Primary; Tricare for Life sceondary    Authorization Time Period 07/05/22-09/01/22    PT Start Time 0931    PT Stop Time 1015    PT Time Calculation (min) 44 min    Equipment Utilized During Treatment Gait belt    Activity Tolerance Patient tolerated treatment well    Behavior During Therapy WFL for tasks assessed/performed                 Past Medical History:  Diagnosis Date   A-fib ([place])    Arthritis    lower back   Basal cell carcinoma 10/29/2020   Right post base of skull/neck   BPH (benign prostatic hyperplasia)    Cancer (HCC)    Skin Cancer   Chronic kidney disease    Chronic Kidney Disease   Coronary artery disease    Dysrhythmia    Atrial Fibrillation; Supraventricular Tachycardia   Gout    Hearing aid worn    has, does not wear   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   L upper back 5.0cm inf to base of neck, 5.0cm lat to spine   Hyperlipidemia    Hypertension    MI, old    Nocturia    Phlebitis    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   SCC IS R foreearm distal   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   SCC IS R forearm proximal   Squamous cell carcinoma of skin 03/26/2018   SCC IS R forearm   Squamous cell carcinoma of skin 01/01/2008   SCC IS R forearm   Wears dentures    partial   Past Surgical History:  Procedure Laterality Date   cardaic stents     CARDIAC CATHETERIZATION     PTCA with Stent Placement   CARDIOVERSION N/A 04/13/2021   Procedure: CARDIOVERSION;  Surgeon: Corey Skains, MD;  Location: [facility];  Service: Cardiovascular;  Laterality: N/A;    CARDIOVERSION N/A 05/05/2021   Procedure: CARDIOVERSION;  Surgeon: Corey Skains, MD;  Location: [facility];  Service: Cardiovascular;  Laterality: N/A;   COLONOSCOPY WITH PROPOFOL N/A 12/15/2015   Procedure: COLONOSCOPY WITH PROPOFOL;  Surgeon: Lollie Sails, MD;  Location: [facility] ENDOSCOPY;  Service: Endoscopy;  Laterality: N/A;   CORONARY ARTERY BYPASS GRAFT  1992   ECTROPION REPAIR Right 12/12/2017   Procedure: REPAIR OF ECTROPION SUTURES/EXTENSIVE RIGHT;  Surgeon: Karle Starch, MD;  Location: [place];  Service: Ophthalmology;  Laterality: Right;   HERNIA REPAIR     HIP ARTHROPLASTY Left 02/19/2022   Procedure: ARTHROPLASTY BIPOLAR HIP (HEMIARTHROPLASTY);  Surgeon: Thornton Park, MD;  Location: [facility];  Service: Orthopedics;  Laterality: Left;   IR KYPHO LUMBAR INC FX REDUCE BONE BX UNI/BIL CANNULATION INC/IMAGING  06/07/2022   IR RADIOLOGIST EVAL & MGMT  06/30/2022   SVT ABLATION  02/22/2017   [facility]   Patient Active Problem List   Diagnosis Date Noted   Acute urinary retention 02/19/2022   Acute delirium 02/18/2022   Dysphagia 02/18/2022   Hyponatremia 02/15/2022   Closed left hip fracture (  [place]) 02/14/2022   Atrial fibrillation with RVR ([place]) 04/12/2021   Bradycardia 07/08/2019   Venous insufficiency of both lower extremities 07/08/2019   Abnormal glucose 01/10/2019   Elevated PSA 01/10/2019   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   Palpitations 06/13/2018   Leg edema, right 09/20/2017   History of BPH 02/21/2017   BPH (benign prostatic hyperplasia) 04/25/2016   Nocturia 04/25/2016   Gout 03/25/2016   History of colon polyps 03/25/2016   Inflammation of a vein 03/25/2016   Percutaneous transluminal coronary angioplasty status 03/25/2016   Supraventricular tachycardia ([place]) 03/25/2016   Benign essential HTN 06/10/2015   Paroxysmal atrial fibrillation ([place]) 01/20/2015   Atrial fibrillation ([place]) 05/13/2014   Coronary artery disease 05/13/2014   Chest pain 05/13/2014    Stage 3a chronic kidney disease (CKD) ([place]) 05/13/2014   Essential (primary) hypertension 05/13/2014   History of cardiac catheterization 05/13/2014   HLD (hyperlipidemia) 05/13/2014   Combined fat and carbohydrate induced hyperlipemia 05/13/2014   History of cardiovascular surgery 05/13/2014   History of anticoagulant therapy 04/28/2014   Warfarin anticoagulation 04/28/2014   Benign prostatic hyperplasia with urinary obstruction 11/05/2013    REFERRING DIAG: LBP  THERAPY DIAG:  Other low back pain  Unsteadiness on feet  Difficulty in walking, not elsewhere classified  Rationale for Evaluation and Treatment Rehabilitation  PERTINENT HISTORY: LBP, balance. Pt fell 02/17/2022 while trying to step up onto his half step to get into his home in the garage. Pt tripped on the rug which is no longer there. Pt fractured L hip S/P L hip hemiarthroplasty on 02/19/2022. Pt underwent home health PT for the hip rehab. Pt fell again a couple of weeks after participating in rehab resulting in lumbar fracture. Does not remember what caused the fall. Pt was doing something in the hall. Pt does not remember if he was using an AD. Pt had a shot in his back which took away the pain. Pt started using his SPC a couple of weeks ago. Home health PT ended last week. Independent ambulation prior to L hip fracture.  PRECAUTIONS: fall risk,   SUBJECTIVE: Pt reports falling this morning. States he was standing up pulling his pants up with cane in front of him when he fell forwards. Denies injury or pain. Reports able to stand up with UE's on chair for support.  PAIN:  Are you having pain? 0/10     TODAY'S TREATMENT:   Therapeutic exercise: 08/08/22 Vitals:   BP: 135/74 mm Hg  HR: 71 BPM      Education on safer techniques to don pants. Education on sitting technique donning pants in chair, performing at RW for added stability when standing to pull up pants, or at bathroom counter top for optimal stability  with chair behind patient for added safety.   STS standard height chair. 2x8, does rely on subtle BUE support to stand and with eccentric portion. SBA.  Alternating lunges SUE support: x6/LE, CGA   Heel to toe raises: x12/direction. VC's for upright posture, preventing hip compensation. Good carryover after cuing. SBA.    Neuro Re-ed:   Anterior cone reaches for anterior postural control: 2x 8 cones varying height (superior/inferior) and to R/L outside BOS.    Static standing perturbations in all planes for postural control: min VC's for upright posture and engaging periscapular musculature to prevent anterior truncal lean. 2 x 1 minute.     Standing four square step exercise, with use of SPC PRN. X8 CW and x8 CCW. VC's for step  height. CGA.     Response to treatment Pt tolerated session well without aggravation of symptoms.      Clinical impression Focus of session today on stepping strategy, postural stability outside BOS and power production due to fall this morning. VSS with heavy education provided on safer techniques in donning pants to prevent falls. Pt tolerating education and session well. Does demonstrate limited power production and muscle weakness with functional mobility primarily with step heights and side and backwards steps. Pt will warrant progress note next session to assess progress towards goals.       PATIENT EDUCATION: Education details: there-ex, HEP Person educated: Patient Education method: Explanation, Demonstration, Tactile cues, Verbal cues, and Handouts Education comprehension: verbalized understanding and returned demonstration   HOME EXERCISE PROGRAM:       Access Code: [email or web address] URL: [email or web address] Date: 07/05/2022 Prepared by: Joneen Boers    Exercises - Standing Hip Abduction with Counter Support  - 2 x daily - 7 x weekly - 1 sets - 10 reps - 5 seconds hold - Sit to Stand with Armchair  - 1 x daily - 3 x weekly - 2  sets - 8 reps - Scapular Retraction with Resistance  - 1 x daily - 3 x weekly - 3 sets - 12 reps The HEP added last session (07/19/2022) per pt: sit <> stand, stepping over obstacles, walking forward and back    PT Short Term Goals - 07/05/22 1808       PT SHORT TERM GOAL #1   Title Pt will be independent with his initial HEP to decrease pain, improve strength, balance, and function.    Baseline Pt has started his initial HEP (07/05/2022)    Time 3    Period Weeks    Status New    Target Date 07/28/22              PT Long Term Goals - 07/05/22 1813       PT LONG TERM GOAL #1   Title Patient will have a decrease in low back pain to 4/10 or less at worst to promote ability to perform standing tasks, ambulate, and negotiate stairs with less difficulty.    Baseline 10/10 low back pain at worst for the past 3 months (07/05/2022)    Time 8    Period Weeks    Status New    Target Date 09/01/22      PT LONG TERM GOAL #2   Title Patient will improve his lumbar FOTO score by at least 10 points as a demonstration of improved function.    Baseline Lumbar spine FOTO 40 (07/05/2022)    Time 8    Period Weeks    Status New    Target Date 09/01/22      PT LONG TERM GOAL #3   Title Pt will improve his TUG score to 12 secodns or less without SPC as a demonstration of improved balance and functional mobility.    Baseline 18.33 seconds average with SPC on R side (07/05/2022)    Time 8    Period Weeks    Status New    Target Date 09/01/22      PT LONG TERM GOAL #4   Title Pt will improve bilateral bilateral hip extension and abduction to promote ability to perform standing tasks more steadily.    Baseline Hip extension 4+/5 R and L, hip abduction 4+/5 R, 4/5 L (07/05/2022)    Time 8    Period Weeks  Status New    Target Date 09/01/22                    Salem Caster. Fairly IV, PT, DPT Physical Therapist- [facility]  08/08/2022, 10:38 AM

## 2022-08-10 ENCOUNTER — Ambulatory Visit: Payer: Medicare Other

## 2022-08-10 DIAGNOSIS — R2681 Unsteadiness on feet: Secondary | ICD-10-CM

## 2022-08-10 DIAGNOSIS — M5459 Other low back pain: Secondary | ICD-10-CM | POA: Diagnosis not present

## 2022-08-10 DIAGNOSIS — R262 Difficulty in walking, not elsewhere classified: Secondary | ICD-10-CM

## 2022-08-10 NOTE — Therapy (Signed)
OUTPATIENT PHYSICAL THERAPY TREATMENT NOTE/PROGRESS NOTE Dates of Reporting Period: 07/05/2022 - 08/10/2022   Patient Name: Robert Rivas MRN: [id number] DOB:[birth year], 81 y.o., male Today's Date: 08/10/2022  PCP: Idelle Crouch, MD REFERRING PROVIDER: Idelle Crouch, MD   PT End of Session - 08/10/22 1012     Visit Number 10    Number of Visits 17    Date for PT Re-Evaluation 09/01/22    Authorization Type UHC Medicare Primary; Tricare for Life sceondary    Authorization Time Period 07/05/22-09/01/22    PT Start Time 1015    PT Stop Time 1100    PT Time Calculation (min) 45 min    Equipment Utilized During Treatment Gait belt    Activity Tolerance Patient tolerated treatment well    Behavior During Therapy WFL for tasks assessed/performed                 Past Medical History:  Diagnosis Date   A-fib ([place])    Arthritis    lower back   Basal cell carcinoma 10/29/2020   Right post base of skull/neck   BPH (benign prostatic hyperplasia)    Cancer (HCC)    Skin Cancer   Chronic kidney disease    Chronic Kidney Disease   Coronary artery disease    Dysrhythmia    Atrial Fibrillation; Supraventricular Tachycardia   Gout    Hearing aid worn    has, does not wear   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   L upper back 5.0cm inf to base of neck, 5.0cm lat to spine   Hyperlipidemia    Hypertension    MI, old    Nocturia    Phlebitis    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   SCC IS R foreearm distal   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   SCC IS R forearm proximal   Squamous cell carcinoma of skin 03/26/2018   SCC IS R forearm   Squamous cell carcinoma of skin 01/01/2008   SCC IS R forearm   Wears dentures    partial   Past Surgical History:  Procedure Laterality Date   cardaic stents     CARDIAC CATHETERIZATION     PTCA with Stent Placement   CARDIOVERSION N/A 04/13/2021   Procedure: CARDIOVERSION;  Surgeon: Corey Skains, MD;  Location:  [facility];  Service: Cardiovascular;  Laterality: N/A;   CARDIOVERSION N/A 05/05/2021   Procedure: CARDIOVERSION;  Surgeon: Corey Skains, MD;  Location: [facility];  Service: Cardiovascular;  Laterality: N/A;   COLONOSCOPY WITH PROPOFOL N/A 12/15/2015   Procedure: COLONOSCOPY WITH PROPOFOL;  Surgeon: Lollie Sails, MD;  Location: [facility] ENDOSCOPY;  Service: Endoscopy;  Laterality: N/A;   CORONARY ARTERY BYPASS GRAFT  1992   ECTROPION REPAIR Right 12/12/2017   Procedure: REPAIR OF ECTROPION SUTURES/EXTENSIVE RIGHT;  Surgeon: Karle Starch, MD;  Location: [place];  Service: Ophthalmology;  Laterality: Right;   HERNIA REPAIR     HIP ARTHROPLASTY Left 02/19/2022   Procedure: ARTHROPLASTY BIPOLAR HIP (HEMIARTHROPLASTY);  Surgeon: Thornton Park, MD;  Location: [facility] ORS;  Service: Orthopedics;  Laterality: Left;   IR KYPHO LUMBAR INC FX REDUCE BONE BX UNI/BIL CANNULATION INC/IMAGING  06/07/2022   IR RADIOLOGIST EVAL & MGMT  06/30/2022   SVT ABLATION  02/22/2017   [facility]   Patient Active Problem List   Diagnosis Date Noted   Acute urinary retention 02/19/2022   Acute delirium 02/18/2022   Dysphagia 02/18/2022  Hyponatremia 02/15/2022   Closed left hip fracture ([place]) 02/14/2022   Atrial fibrillation with RVR ([place]) 04/12/2021   Bradycardia 07/08/2019   Venous insufficiency of both lower extremities 07/08/2019   Abnormal glucose 01/10/2019   Elevated PSA 01/10/2019   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   Palpitations 06/13/2018   Leg edema, right 09/20/2017   History of BPH 02/21/2017   BPH (benign prostatic hyperplasia) 04/25/2016   Nocturia 04/25/2016   Gout 03/25/2016   History of colon polyps 03/25/2016   Inflammation of a vein 03/25/2016   Percutaneous transluminal coronary angioplasty status 03/25/2016   Supraventricular tachycardia ([place]) 03/25/2016   Benign essential HTN 06/10/2015   Paroxysmal atrial fibrillation ([place]) 01/20/2015   Atrial fibrillation ([place]) 05/13/2014    Coronary artery disease 05/13/2014   Chest pain 05/13/2014   Stage 3a chronic kidney disease (CKD) ([place]) 05/13/2014   Essential (primary) hypertension 05/13/2014   History of cardiac catheterization 05/13/2014   HLD (hyperlipidemia) 05/13/2014   Combined fat and carbohydrate induced hyperlipemia 05/13/2014   History of cardiovascular surgery 05/13/2014   History of anticoagulant therapy 04/28/2014   Warfarin anticoagulation 04/28/2014   Benign prostatic hyperplasia with urinary obstruction 11/05/2013    REFERRING DIAG: LBP  THERAPY DIAG:  Other low back pain  Unsteadiness on feet  Difficulty in walking, not elsewhere classified  Rationale for Evaluation and Treatment Rehabilitation  PERTINENT HISTORY: LBP, balance. Pt fell 02/17/2022 while trying to step up onto his half step to get into his home in the garage. Pt tripped on the rug which is no longer there. Pt fractured L hip S/P L hip hemiarthroplasty on 02/19/2022. Pt underwent home health PT for the hip rehab. Pt fell again a couple of weeks after participating in rehab resulting in lumbar fracture. Does not remember what caused the fall. Pt was doing something in the hall. Pt does not remember if he was using an AD. Pt had a shot in his back which took away the pain. Pt started using his SPC a couple of weeks ago. Home health PT ended last week. Independent ambulation prior to L hip fracture.  PRECAUTIONS: fall risk,   SUBJECTIVE: Pt denies pain and falls. Has been trying alternate techniques dicussed last visit donning pants with success.  PAIN:  Are you having pain? 0/10     TODAY'S TREATMENT:   Therapeutic exercise: 08/10/22  Reassessment of goals:   Compliant with HEP   FOTO: 52 with target of 53  No back pain with standing, walking tasks   TUG: 22 seconds with SPC in RUE Scap retraction GTB: 3x12    STS from standard chair + airex. UE's resting on knees. Education on weight shift anteriorly.     Gait training  with SPC in RUE:  2x100' bouts with VC's for upright posture and two point step through gait. Initialyl pt ambulates with 3 point step to pattern. Post PT demo, pt able to complete 2 laps in gym with min to mod VC's for step lengths. Correct SPC sequencing but pt easily reverts to step to pattern without cues. Education provided on step through gait with increasing step lengths to increase gait speed thus decrease risk of falls and further use of LE musculature through greater ranges of motion to improve functional LE strength. CGA throughout.        Response to treatment Pt tolerated session well without aggravation of symptoms.      Clinical impression Pt on 10th visit thus warranting a progress note. Pt making progress  with reports of no LBP since beginning PT and improved FOTO score leading to accomplishing those two long term goals. Pt has regressed in TUG time. Deferred MMT of LE's due to poor intrarater reliability but did perform 5xSTS to understand functional LE strength more objectively scoring at 27.79 seconds without UE support. TUG and 5xSTS do indicate pt is at high risk for falls.  Pt making great progress towards pain management of back but would greatly benefit from consistent PT to improve LE strength, gait and balance to reduce risk of falls and maximize independence with ADL completion.        PATIENT EDUCATION: Education details: there-ex, HEP Person educated: Patient Education method: Explanation, Demonstration, Tactile cues, Verbal cues, and Handouts Education comprehension: verbalized understanding and returned demonstration   HOME EXERCISE PROGRAM:       Access Code: [email or web address] URL: [email or web address] Date: 07/05/2022 Prepared by: Joneen Boers    Exercises - Standing Hip Abduction with Counter Support  - 2 x daily - 7 x weekly - 1 sets - 10 reps - 5 seconds hold - Sit to Stand with Armchair  - 1 x daily - 3 x weekly - 2 sets - 8 reps -  Scapular Retraction with Resistance  - 1 x daily - 3 x weekly - 3 sets - 12 reps The HEP added last session (07/19/2022) per pt: sit <> stand, stepping over obstacles, walking forward and back    PT Short Term Goals - 08/10/22 1019       PT SHORT TERM GOAL #1   Title Pt will be independent with his initial HEP to decrease pain, improve strength, balance, and function.    Baseline Pt has started his initial HEP (07/05/2022); 08/10/2022 reports independence compelting as prescribed.    Time 3    Period Weeks    Status Achieved    Target Date 08/10/22              PT Long Term Goals - 08/10/22 1019       PT LONG TERM GOAL #1   Title Patient will have a decrease in low back pain to 4/10 or less at worst to promote ability to perform standing tasks, ambulate, and negotiate stairs with less difficulty.    Baseline 10/10 low back pain at worst for the past 3 months (07/05/2022); denies back pain with standing, walking or stair negotiation.    Time 8    Period Weeks    Status Achieved    Target Date 08/10/22      PT LONG TERM GOAL #2   Title Patient will improve his lumbar FOTO score by at least 10 points as a demonstration of improved function.    Baseline Lumbar spine FOTO 40 (07/05/2022); 52    Time 8    Period Weeks    Status Achieved    Target Date 08/10/22      PT LONG TERM GOAL #3   Title Pt will improve his TUG score to 12 seconds or less without SPC as a demonstration of improved balance and functional mobility.    Baseline 18.33 seconds average with SPC on R side (07/05/2022); 22.05 sec  with SPC on RUE    Time 8    Period Weeks    Status On-going    Target Date 09/01/22      PT LONG TERM GOAL #4   Title Pt will improve bilateral bilateral hip extension and abduction to promote ability to perform standing tasks more  steadily.    Baseline Hip extension 4+/5 R and L, hip abduction 4+/5 R, 4/5 L (07/05/2022); deferred for primary PT    Time 8    Period Weeks    Status New     Target Date 09/01/22      PT LONG TERM GOAL #5   Title Pt will improve 5xSTS without UE support to 14.8 sec or less to demonstrate clinically significant improvement for age matched norms for LE strength and reduced risk of falls.    Baseline 08/10/22: 27.29 with UE's on thighs standard height chair    Time 3    Period Weeks    Status New    Target Date 09/01/22                    Salem Caster. Fairly IV, PT, DPT Physical Therapist- [facility]  08/10/2022, 12:08 PM

## 2022-08-15 ENCOUNTER — Ambulatory Visit: Payer: Medicare Other

## 2022-08-15 DIAGNOSIS — R262 Difficulty in walking, not elsewhere classified: Secondary | ICD-10-CM

## 2022-08-15 DIAGNOSIS — M5459 Other low back pain: Secondary | ICD-10-CM | POA: Diagnosis not present

## 2022-08-15 DIAGNOSIS — R2681 Unsteadiness on feet: Secondary | ICD-10-CM

## 2022-08-15 NOTE — Therapy (Signed)
OUTPATIENT PHYSICAL THERAPY TREATMENT NOTE   Patient Name: Robert Rivas MRN: [id number] DOB:[birth year], 81 y.o., male Today's Date: 08/15/2022  PCP: Idelle Crouch, MD REFERRING PROVIDER: Idelle Crouch, MD   PT End of Session - 08/15/22 0839     Visit Number 11    Number of Visits 17    Date for PT Re-Evaluation 09/01/22    Authorization Type UHC Medicare Primary; Tricare for Life sceondary    Authorization Time Period 07/05/22-09/01/22    PT Start Time 0839    PT Stop Time 0922    PT Time Calculation (min) 43 min    Equipment Utilized During Treatment Gait belt    Activity Tolerance Patient tolerated treatment well    Behavior During Therapy [facility] for tasks assessed/performed                  Past Medical History:  Diagnosis Date   A-fib ([place])    Arthritis    lower back   Basal cell carcinoma 10/29/2020   Right post base of skull/neck   BPH (benign prostatic hyperplasia)    Cancer (HCC)    Skin Cancer   Chronic kidney disease    Chronic Kidney Disease   Coronary artery disease    Dysrhythmia    Atrial Fibrillation; Supraventricular Tachycardia   Gout    Hearing aid worn    has, does not wear   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   L upper back 5.0cm inf to base of neck, 5.0cm lat to spine   Hyperlipidemia    Hypertension    MI, old    Nocturia    Phlebitis    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   SCC IS R foreearm distal   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   SCC IS R forearm proximal   Squamous cell carcinoma of skin 03/26/2018   SCC IS R forearm   Squamous cell carcinoma of skin 01/01/2008   SCC IS R forearm   Wears dentures    partial   Past Surgical History:  Procedure Laterality Date   cardaic stents     CARDIAC CATHETERIZATION     PTCA with Stent Placement   CARDIOVERSION N/A 04/13/2021   Procedure: CARDIOVERSION;  Surgeon: Corey Skains, MD;  Location: [facility] OR[facility];  Service: Cardiovascular;  Laterality: N/A;    CARDIOVERSION N/A 05/05/2021   Procedure: CARDIOVERSION;  Surgeon: Corey Skains, MD;  Location: [facility] OR[facility];  Service: Cardiovascular;  Laterality: N/A;   COLONOSCOPY WITH PROPOFOL N/A 12/15/2015   Procedure: COLONOSCOPY WITH PROPOFOL;  Surgeon: Lollie Sails, MD;  Location: [facility] ENDOSCOPY;  Service: Endoscopy;  Laterality: N/A;   CORONARY ARTERY BYPASS GRAFT  1992   ECTROPION REPAIR Right 12/12/2017   Procedure: REPAIR OF ECTROPION SUTURES/EXTENSIVE RIGHT;  Surgeon: Karle Starch, MD;  Location: [place];  Service: Ophthalmology;  Laterality: Right;   HERNIA REPAIR     HIP ARTHROPLASTY Left 02/19/2022   Procedure: ARTHROPLASTY BIPOLAR HIP (HEMIARTHROPLASTY);  Surgeon: Thornton Park, MD;  Location: [facility] OR[facility];  Service: Orthopedics;  Laterality: Left;   IR KYPHO LUMBAR INC FX REDUCE BONE BX UNI/BIL CANNULATION INC/IMAGING  06/07/2022   IR RADIOLOGIST EVAL & MGMT  06/30/2022   SVT ABLATION  02/22/2017   [facility]   Patient Active Problem List   Diagnosis Date Noted   Acute urinary retention 02/19/2022   Acute delirium 02/18/2022   Dysphagia 02/18/2022   Hyponatremia 02/15/2022   Closed left hip  fracture ([place]) 02/14/2022   Atrial fibrillation with RVR ([place]) 04/12/2021   Bradycardia 07/08/2019   Venous insufficiency of both lower extremities 07/08/2019   Abnormal glucose 01/10/2019   Elevated PSA 01/10/2019   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   Palpitations 06/13/2018   Leg edema, right 09/20/2017   History of BPH 02/21/2017   BPH (benign prostatic hyperplasia) 04/25/2016   Nocturia 04/25/2016   Gout 03/25/2016   History of colon polyps 03/25/2016   Inflammation of a vein 03/25/2016   Percutaneous transluminal coronary angioplasty status 03/25/2016   Supraventricular tachycardia ([place]) 03/25/2016   Benign essential HTN 06/10/2015   Paroxysmal atrial fibrillation ([place]) 01/20/2015   Atrial fibrillation ([place]) 05/13/2014   Coronary artery disease 05/13/2014   Chest pain 05/13/2014    Stage 3a chronic kidney disease (CKD) ([place]) 05/13/2014   Essential (primary) hypertension 05/13/2014   History of cardiac catheterization 05/13/2014   HLD (hyperlipidemia) 05/13/2014   Combined fat and carbohydrate induced hyperlipemia 05/13/2014   History of cardiovascular surgery 05/13/2014   History of anticoagulant therapy 04/28/2014   Warfarin anticoagulation 04/28/2014   Benign prostatic hyperplasia with urinary obstruction 11/05/2013    REFERRING DIAG: LBP  THERAPY DIAG:  Other low back pain  Unsteadiness on feet  Difficulty in walking, not elsewhere classified  Rationale for Evaluation and Treatment Rehabilitation  PERTINENT HISTORY: LBP, balance. Pt fell 02/17/2022 while trying to step up onto his half step to get into his home in the garage. Pt tripped on the rug which is no longer there. Pt fractured L hip S/P L hip hemiarthroplasty on 02/19/2022. Pt underwent home health PT for the hip rehab. Pt fell again a couple of weeks after participating in rehab resulting in lumbar fracture. Does not remember what caused the fall. Pt was doing something in the hall. Pt does not remember if he was using an AD. Pt had a shot in his back which took away the pain. Pt started using his SPC a couple of weeks ago. Home health PT ended last week. Independent ambulation prior to L hip fracture.  PRECAUTIONS: fall risk,   SUBJECTIVE: a little wobbly this morning. Woke up at 6:30 am. Usually wakes up around 8:30 am to 9 am. No falls recently.   PAIN:  Are you having pain? 0/10     TODAY'S TREATMENT:   Therapeutic exerise   Gait with SPC on R side, cues for increasing R LE step length.   STS from standard chair + airex. UE's resting on knees.7x, then 8x  Education on weight shift anteriorly and placing and maintaining his center of gravity over his feet.   SLS with contralateral UE assist and contralateral LE on furniture slider  R 10x3  L 10x3  Forward step up onto first regular  step with B UE assist  L 10x2  R 10x2  Stepping over 4 mini hurdles with R SPC assist 2x3  Cues for increasing hip and knee flexion to clear hurdles. Difficulty clearing hurdles secondary to decreased foot clearance.   Cues for forward weight shifting to place his center of gravity over base of support  Cues for proper SPC placement    Gait from gym to car with SPC, cues for increasing step length. Improved steadiness in gait observed.    Improved exercise technique, movement at target joints, use of target muscles after mod verbal, visual, tactile cues.         Response to treatment Pt tolerated session well without aggravation of symptoms.  Clinical impression Cues needed for forward weight shifting as well as increasing step length and foot clearance when performing transfers and obstacle negotiation. Worked on improving B glute strength to promote single leg stance strength as well as increasing hip and knee flexion and step length to improve ability to clear obstacles and ambulate with less difficulty.  Pt tolerated session well without aggravation of symptoms. Pt will benefit from continued skilled physical therapy services to decrease pain, improve strength, balance, and function.        PATIENT EDUCATION: Education details: there-ex, HEP Person educated: Patient Education method: Explanation, Demonstration, Tactile cues, Verbal cues, and Handouts Education comprehension: verbalized understanding and returned demonstration   HOME EXERCISE PROGRAM:       Access Code: [email or web address] URL: [email or web address] Date: 07/05/2022 Prepared by: Joneen Boers    Exercises - Standing Hip Abduction with Counter Support  - 2 x daily - 7 x weekly - 1 sets - 10 reps - 5 seconds hold - Sit to Stand with Armchair  - 1 x daily - 3 x weekly - 2 sets - 8 reps - Scapular Retraction with Resistance  - 1 x daily - 3 x weekly - 3 sets - 12 reps The HEP added last session  (07/19/2022) per pt: sit <> stand, stepping over obstacles, walking forward and back    PT Short Term Goals - 08/10/22 1019       PT SHORT TERM GOAL #1   Title Pt will be independent with his initial HEP to decrease pain, improve strength, balance, and function.    Baseline Pt has started his initial HEP (07/05/2022); 08/10/2022 reports independence compelting as prescribed.    Time 3    Period Weeks    Status Achieved    Target Date 08/10/22              PT Long Term Goals - 08/10/22 1019       PT LONG TERM GOAL #1   Title Patient will have a decrease in low back pain to 4/10 or less at worst to promote ability to perform standing tasks, ambulate, and negotiate stairs with less difficulty.    Baseline 10/10 low back pain at worst for the past 3 months (07/05/2022); denies back pain with standing, walking or stair negotiation.    Time 8    Period Weeks    Status Achieved    Target Date 08/10/22      PT LONG TERM GOAL #2   Title Patient will improve his lumbar FOTO score by at least 10 points as a demonstration of improved function.    Baseline Lumbar spine FOTO 40 (07/05/2022); 52    Time 8    Period Weeks    Status Achieved    Target Date 08/10/22      PT LONG TERM GOAL #3   Title Pt will improve his TUG score to 12 seconds or less without SPC as a demonstration of improved balance and functional mobility.    Baseline 18.33 seconds average with SPC on R side (07/05/2022); 22.05 sec  with SPC on RUE    Time 8    Period Weeks    Status On-going    Target Date 09/01/22      PT LONG TERM GOAL #4   Title Pt will improve bilateral bilateral hip extension and abduction to promote ability to perform standing tasks more steadily.    Baseline Hip extension 4+/5 R and L, hip abduction 4+/5 R, 4/5 L (  07/05/2022); deferred for primary PT    Time 8    Period Weeks    Status New    Target Date 09/01/22      PT LONG TERM GOAL #5   Title Pt will improve 5xSTS without UE support to  14.8 sec or less to demonstrate clinically significant improvement for age matched norms for LE strength and reduced risk of falls.    Baseline 08/10/22: 27.29 with UE's on thighs standard height chair    Time 3    Period Weeks    Status New    Target Date 09/01/22              Plan - 08/15/22 0836     Clinical Impression Statement Cues needed for forward weight shifting as well as increasing step length and foot clearance when performing transfers and obstacle negotiation. Worked on improving B glute strength to promote single leg stance strength as well as increasing hip and knee flexion and step length to improve ability to clear obstacles and ambulate with less difficulty.  Pt tolerated session well without aggravation of symptoms. Pt will benefit from continued skilled physical therapy services to decrease pain, improve strength, balance, and function.    Personal Factors and Comorbidities Age;Comorbidity 3+;Past/Current Experience;Time since onset of injury/illness/exacerbation    Comorbidities A-fib, arthritis, basal cell CA, chronic kidney disease, dysrhythmia, HTN, MI, phlebitis, L hip hemiarthroplasty    Examination-Activity Limitations Stairs;Stand;Bend;Locomotion Level;Transfers    Stability/Clinical Decision Making Stable/Uncomplicated    Clinical Decision Making Low    Rehab Potential Fair    PT Frequency 2x / week    PT Duration 8 weeks    PT Treatment/Interventions Therapeutic exercise;Therapeutic activities;Stair training;Functional mobility training;Balance training;Neuromuscular re-education;Patient/family education;Manual techniques;Dry needling;Aquatic Therapy;Electrical Stimulation;Iontophoresis '4mg'$ /ml Dexamethasone;Gait training    PT Next Visit Plan posture, thoracic and hip extension, trunk and hip strengthening, manual techniques, modalities PRN    PT Home Exercise Plan Medbridge Access Code: [email or web address]    Consulted and Agree with Plan of Care Patient                    Joneen Boers PT, DPT   08/15/2022, 9:32 AM

## 2022-08-17 ENCOUNTER — Ambulatory Visit: Payer: Medicare Other

## 2022-08-17 DIAGNOSIS — R2681 Unsteadiness on feet: Secondary | ICD-10-CM

## 2022-08-17 DIAGNOSIS — R262 Difficulty in walking, not elsewhere classified: Secondary | ICD-10-CM

## 2022-08-17 DIAGNOSIS — M5459 Other low back pain: Secondary | ICD-10-CM | POA: Diagnosis not present

## 2022-08-17 NOTE — Therapy (Signed)
OUTPATIENT PHYSICAL THERAPY TREATMENT NOTE   Patient Name: Robert Rivas MRN: [id number] DOB:31-May-1941, 81 y.o., male Today's Date: 08/17/2022  PCP: Idelle Crouch, MD REFERRING PROVIDER: Idelle Crouch, MD   PT End of Session - 08/17/22 1101     Visit Number 12    Number of Visits 17    Date for PT Re-Evaluation 09/01/22    Authorization Type [employer] Medicare Primary; [employer] sceondary    Authorization Time Period 07/05/22-09/01/22    PT Start Time 1101    PT Stop Time 1141    PT Time Calculation (min) 40 min    Equipment Utilized During Treatment Gait belt    Activity Tolerance Patient tolerated treatment well    Behavior During Therapy WFL for tasks assessed/performed                   Past Medical History:  Diagnosis Date   A-fib ([place])    Arthritis    lower back   Basal cell carcinoma 10/29/2020   Right post base of skull/neck   BPH (benign prostatic hyperplasia)    Cancer (HCC)    Skin Cancer   Chronic kidney disease    Chronic Kidney Disease   Coronary artery disease    Dysrhythmia    Atrial Fibrillation; Supraventricular Tachycardia   Gout    Hearing aid worn    has, does not wear   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   L upper back 5.0cm inf to base of neck, 5.0cm lat to spine   Hyperlipidemia    Hypertension    MI, old    Nocturia    Phlebitis    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   SCC IS R foreearm distal   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   SCC IS R forearm proximal   Squamous cell carcinoma of skin 03/26/2018   SCC IS R forearm   Squamous cell carcinoma of skin 01/01/2008   SCC IS R forearm   Wears dentures    partial   Past Surgical History:  Procedure Laterality Date   cardaic stents     CARDIAC CATHETERIZATION     PTCA with Stent Placement   CARDIOVERSION N/A 04/13/2021   Procedure: CARDIOVERSION;  Surgeon: Corey Skains, MD;  Location: [facility];  Service: Cardiovascular;  Laterality: N/A;    CARDIOVERSION N/A 05/05/2021   Procedure: CARDIOVERSION;  Surgeon: Corey Skains, MD;  Location: [facility];  Service: Cardiovascular;  Laterality: N/A;   COLONOSCOPY WITH PROPOFOL N/A 12/15/2015   Procedure: COLONOSCOPY WITH PROPOFOL;  Surgeon: Lollie Sails, MD;  Location: [facility];  Service: Endoscopy;  Laterality: N/A;   CORONARY ARTERY BYPASS GRAFT  1992   ECTROPION REPAIR Right 12/12/2017   Procedure: REPAIR OF ECTROPION SUTURES/EXTENSIVE RIGHT;  Surgeon: Karle Starch, MD;  Location: [place];  Service: Ophthalmology;  Laterality: Right;   HERNIA REPAIR     HIP ARTHROPLASTY Left 02/19/2022   Procedure: ARTHROPLASTY BIPOLAR HIP (HEMIARTHROPLASTY);  Surgeon: Thornton Park, MD;  Location: [facility];  Service: Orthopedics;  Laterality: Left;   IR KYPHO LUMBAR INC FX REDUCE BONE BX UNI/BIL CANNULATION INC/IMAGING  06/07/2022   IR RADIOLOGIST EVAL & MGMT  06/30/2022   SVT ABLATION  02/22/2017   [facility]   Patient Active Problem List   Diagnosis Date Noted   Acute urinary retention 02/19/2022   Acute delirium 02/18/2022   Dysphagia 02/18/2022   Hyponatremia 02/15/2022   Closed left  hip fracture ([place]) 02/14/2022   Atrial fibrillation with RVR ([place]) 04/12/2021   Bradycardia 07/08/2019   Venous insufficiency of both lower extremities 07/08/2019   Abnormal glucose 01/10/2019   Elevated PSA 01/10/2019   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   Palpitations 06/13/2018   Leg edema, right 09/20/2017   History of BPH 02/21/2017   BPH (benign prostatic hyperplasia) 04/25/2016   Nocturia 04/25/2016   Gout 03/25/2016   History of colon polyps 03/25/2016   Inflammation of a vein 03/25/2016   Percutaneous transluminal coronary angioplasty status 03/25/2016   Supraventricular tachycardia ([place]) 03/25/2016   Benign essential HTN 06/10/2015   Paroxysmal atrial fibrillation ([place]) 01/20/2015   Atrial fibrillation ([place]) 05/13/2014   Coronary artery disease 05/13/2014   Chest pain 05/13/2014    Stage 3a chronic kidney disease (CKD) ([place]) 05/13/2014   Essential (primary) hypertension 05/13/2014   History of cardiac catheterization 05/13/2014   HLD (hyperlipidemia) 05/13/2014   Combined fat and carbohydrate induced hyperlipemia 05/13/2014   History of cardiovascular surgery 05/13/2014   History of anticoagulant therapy 04/28/2014   Warfarin anticoagulation 04/28/2014   Benign prostatic hyperplasia with urinary obstruction 11/05/2013    REFERRING DIAG: LBP  THERAPY DIAG:  Other low back pain  Unsteadiness on feet  Difficulty in walking, not elsewhere classified  Rationale for Evaluation and Treatment Rehabilitation  PERTINENT HISTORY: LBP, balance. Pt fell 02/17/2022 while trying to step up onto his half step to get into his home in the garage. Pt tripped on the rug which is no longer there. Pt fractured L hip S/P L hip hemiarthroplasty on 02/19/2022. Pt underwent home health PT for the hip rehab. Pt fell again a couple of weeks after participating in rehab resulting in lumbar fracture. Does not remember what caused the fall. Pt was doing something in the hall. Pt does not remember if he was using an AD. Pt had a shot in his back which took away the pain. Pt started using his SPC a couple of weeks ago. Home health PT ended last week. Independent ambulation prior to L hip fracture.  PRECAUTIONS: fall risk,   SUBJECTIVE: Back is doing ok.  PAIN:  Are you having pain? 0/10     TODAY'S TREATMENT:   Therapeutic exerise   Gait with SPC on R side, cues for increasing R LE step length.   STS from standard chair with arms. B UE assist. Eccentric lowering 5x   STS from standard chair + airex. UE's resting on knees 8x, then 6x  Cues for forward weight shifting onto his base of support to stand.    SLS with contralateral UE assist and contralateral LE on furniture slider  Hip abduction  R 10x3  L 10x3   Hip circles clockwise and counterclockwise  R 5x3 each direction  L  5x3 each direction    Standing forward weight shift onto forefeet to improve ankle strategy and decrease backward lean 5x5 seconds for 4 sets with B UE to no UE assist  Decreased B ankle DF ROM observed.   Standing ankle DF/PF on rocker board with B UE assist to promote ankle ROM for improving ankle strategy. 2  minutes   Stepping over 3 mini hurdles with one UE assist on treadmill bar 4x   Cues for increasing hip and knee flexion as well as step length and forward weight shifting to clear hurdles.     Gait from gym to car with [facility], cues for increasing step length.    Improved exercise technique, movement  at target joints, use of target muscles after mod verbal, visual, tactile cues.         Response to treatment Pt tolerated session well without aggravation of symptoms.      Clinical impression Cues needed to improve forward weight shifting to decrease backward lean with transfers as well as with obstacle negotiation. Demonstrates increased use of hip strategy. Worked on increasing ankle strategy for forward weight shifting to help address. Cues also needed to increase step length during gait. Pt demonstrates festinating gait pattern.  Pt tolerated session well without aggravation of symptoms. Pt will benefit from continued skilled physical therapy services to decrease pain, improve strength, balance, and function.        PATIENT EDUCATION: Education details: there-ex, HEP Person educated: Patient Education method: Explanation, Demonstration, Tactile cues, Verbal cues, and Handouts Education comprehension: verbalized understanding and returned demonstration   HOME EXERCISE PROGRAM:       Access Code: [email or web address] URL: [email or web address] Date: 07/05/2022 Prepared by: Joneen Boers    Exercises - Standing Hip Abduction with Counter Support  - 2 x daily - 7 x weekly - 1 sets - 10 reps - 5 seconds hold - Sit to Stand with Armchair  - 1 x daily - 3 x  weekly - 2 sets - 8 reps - Scapular Retraction with Resistance  - 1 x daily - 3 x weekly - 3 sets - 12 reps The HEP added last session (07/19/2022) per pt: sit <> stand, stepping over obstacles, walking forward and back    PT Short Term Goals - 08/10/22 1019       PT SHORT TERM GOAL #1   Title Pt will be independent with his initial HEP to decrease pain, improve strength, balance, and function.    Baseline Pt has started his initial HEP (07/05/2022); 08/10/2022 reports independence compelting as prescribed.    Time 3    Period Weeks    Status Achieved    Target Date 08/10/22              PT Long Term Goals - 08/10/22 1019       PT LONG TERM GOAL #1   Title Patient will have a decrease in low back pain to 4/10 or less at worst to promote ability to perform standing tasks, ambulate, and negotiate stairs with less difficulty.    Baseline 10/10 low back pain at worst for the past 3 months (07/05/2022); denies back pain with standing, walking or stair negotiation.    Time 8    Period Weeks    Status Achieved    Target Date 08/10/22      PT LONG TERM GOAL #2   Title Patient will improve his lumbar FOTO score by at least 10 points as a demonstration of improved function.    Baseline Lumbar spine FOTO 40 (07/05/2022); 52    Time 8    Period Weeks    Status Achieved    Target Date 08/10/22      PT LONG TERM GOAL #3   Title Pt will improve his TUG score to 12 seconds or less without SPC as a demonstration of improved balance and functional mobility.    Baseline 18.33 seconds average with SPC on R side (07/05/2022); 22.05 sec  with SPC on RUE    Time 8    Period Weeks    Status On-going    Target Date 09/01/22      PT LONG TERM GOAL #4   Title Pt  will improve bilateral bilateral hip extension and abduction to promote ability to perform standing tasks more steadily.    Baseline Hip extension 4+/5 R and L, hip abduction 4+/5 R, 4/5 L (07/05/2022); deferred for primary PT    Time 8     Period Weeks    Status New    Target Date 09/01/22      PT LONG TERM GOAL #5   Title Pt will improve 5xSTS without UE support to 14.8 sec or less to demonstrate clinically significant improvement for age matched norms for LE strength and reduced risk of falls.    Baseline 08/10/22: 27.29 with UE's on thighs standard height chair    Time 3    Period Weeks    Status New    Target Date 09/01/22              Plan - 08/17/22 1100     Clinical Impression Statement Cues needed to improve forward weight shifting to decrease backward lean with transfers as well as with obstacle negotiation. Demonstrates increased use of hip strategy. Worked on increasing ankle strategy for forward weight shifting to help address. Cues also needed to increase step length during gait. Pt demonstrates festinating gait pattern.  Pt tolerated session well without aggravation of symptoms. Pt will benefit from continued skilled physical therapy services to decrease pain, improve strength, balance, and function.    Personal Factors and Comorbidities Age;Comorbidity 3+;Past/Current Experience;Time since onset of injury/illness/exacerbation    Comorbidities A-fib, arthritis, basal cell CA, chronic kidney disease, dysrhythmia, HTN, MI, phlebitis, L hip hemiarthroplasty    Examination-Activity Limitations Stairs;Stand;Bend;Locomotion Level;Transfers    Stability/Clinical Decision Making Stable/Uncomplicated    Rehab Potential Fair    PT Frequency 2x / week    PT Duration 8 weeks    PT Treatment/Interventions Therapeutic exercise;Therapeutic activities;Stair training;Functional mobility training;Balance training;Neuromuscular re-education;Patient/family education;Manual techniques;Dry needling;Aquatic Therapy;Electrical Stimulation;Iontophoresis '4mg'$ /ml Dexamethasone;Gait training    PT Next Visit Plan posture, thoracic and hip extension, trunk and hip strengthening, manual techniques, modalities PRN    PT Home Exercise Plan  Medbridge Access Code: [email or web address]    Consulted and Agree with Plan of Care Patient                    Joneen Boers PT, DPT   08/17/2022, 12:13 PM

## 2022-09-05 ENCOUNTER — Ambulatory Visit: Payer: Medicare Other | Attending: Internal Medicine

## 2022-09-05 DIAGNOSIS — R262 Difficulty in walking, not elsewhere classified: Secondary | ICD-10-CM | POA: Diagnosis present

## 2022-09-05 DIAGNOSIS — M5459 Other low back pain: Secondary | ICD-10-CM | POA: Insufficient documentation

## 2022-09-05 DIAGNOSIS — R2681 Unsteadiness on feet: Secondary | ICD-10-CM | POA: Insufficient documentation

## 2022-09-05 NOTE — Therapy (Signed)
OUTPATIENT PHYSICAL THERAPY TREATMENT NOTE   Patient Name: Robert Rivas MRN: [id number] DOB:[birth year], 81 y.o., male Today's Date: 09/05/2022  PCP: Idelle Crouch, MD REFERRING PROVIDER: Idelle Crouch, MD   PT End of Session - 09/05/22 0930     Visit Number 13    Number of Visits 33    Date for PT Re-Evaluation 11/03/22    Authorization Type UHC Medicare Primary; Tric[employer] for Life sceondary    Authorization Time Period 07/05/22-09/01/22    PT Start Time 0930    PT Stop Time 1010    PT Time Calculation (min) 40 min    Equipment Utilized During Treatment Gait belt    Activity Tolerance Patient tolerated treatment well    Behavior During Therapy WFL for tasks assessed/performed                    Past Medical History:  Diagnosis Date   A-fib ([place])    Arthritis    lower back   Basal cell carcinoma 10/29/2020   Right post base of skull/neck   BPH (benign prostatic hyperplasia)    Cancer (HCC)    Skin Cancer   Chronic kidney disease    Chronic Kidney Disease   Coronary artery disease    Dysrhythmia    Atrial Fibrillation; Supraventricular Tachycardia   Gout    Hearing aid worn    has, does not wear   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   L upper back 5.0cm inf to base of neck, 5.0cm lat to spine   Hyperlipidemia    Hypertension    MI, old    Nocturia    Phlebitis    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   SCC IS R foreearm distal   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   SCC IS R forearm proximal   Squamous cell carcinoma of skin 03/26/2018   SCC IS R forearm   Squamous cell carcinoma of skin 01/01/2008   SCC IS R forearm   Wears dentures    partial   Past Surgical History:  Procedure Laterality Date   cardaic stents     CARDIAC CATHETERIZATION     PTCA with Stent Placement   CARDIOVERSION N/A 04/13/2021   Procedure: CARDIOVERSION;  Surgeon: Corey Skains, MD;  Location: [facility];  Service: Cardiovascular;  Laterality: N/A;    CARDIOVERSION N/A 05/05/2021   Procedure: CARDIOVERSION;  Surgeon: Corey Skains, MD;  Location: [facility];  Service: Cardiovascular;  Laterality: N/A;   COLONOSCOPY WITH PROPOFOL N/A 12/15/2015   Procedure: COLONOSCOPY WITH PROPOFOL;  Surgeon: Lollie Sails, MD;  Location: [facility] ENDOSCOPY;  Service: Endoscopy;  Laterality: N/A;   CORONARY ARTERY BYPASS GRAFT  1992   ECTROPION REPAIR Right 12/12/2017   Procedure: REPAIR OF ECTROPION SUTURES/EXTENSIVE RIGHT;  Surgeon: Karle Starch, MD;  Location: [place];  Service: Ophthalmology;  Laterality: Right;   HERNIA REPAIR     HIP ARTHROPLASTY Left 02/19/2022   Procedure: ARTHROPLASTY BIPOLAR HIP (HEMIARTHROPLASTY);  Surgeon: Thornton Park, MD;  Location: [facility];  Service: Orthopedics;  Laterality: Left;   IR KYPHO LUMBAR INC FX REDUCE BONE BX UNI/BIL CANNULATION INC/IMAGING  06/07/2022   IR RADIOLOGIST EVAL & MGMT  06/30/2022   SVT ABLATION  02/22/2017   [facility]   Patient Active Problem List   Diagnosis Date Noted   Acute urinary retention 02/19/2022   Acute delirium 02/18/2022   Dysphagia 02/18/2022   Hyponatremia 02/15/2022   Closed  left hip fracture ([place]) 02/14/2022   Atrial fibrillation with RVR ([place]) 04/12/2021   Bradycardia 07/08/2019   Venous insufficiency of both lower extremities 07/08/2019   Abnormal glucose 01/10/2019   Elevated PSA 01/10/2019   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   Palpitations 06/13/2018   Leg edema, right 09/20/2017   History of BPH 02/21/2017   BPH (benign prostatic hyperplasia) 04/25/2016   Nocturia 04/25/2016   Gout 03/25/2016   History of colon polyps 03/25/2016   Inflammation of a vein 03/25/2016   Percutaneous transluminal coronary angioplasty status 03/25/2016   Supraventricular tachycardia ([place]) 03/25/2016   Benign essential HTN 06/10/2015   Paroxysmal atrial fibrillation ([place]) 01/20/2015   Atrial fibrillation ([place]) 05/13/2014   Coronary artery disease 05/13/2014   Chest pain 05/13/2014    Stage 3a chronic kidney disease (CKD) ([place]) 05/13/2014   Essential (primary) hypertension 05/13/2014   History of cardiac catheterization 05/13/2014   HLD (hyperlipidemia) 05/13/2014   Combined fat and carbohydrate induced hyperlipemia 05/13/2014   History of cardiovascular surgery 05/13/2014   History of anticoagulant therapy 04/28/2014   Warfarin anticoagulation 04/28/2014   Benign prostatic hyperplasia with urinary obstruction 11/05/2013    REFERRING DIAG: LBP  THERAPY DIAG:  Other low back pain - Plan: PT plan of care cert/re-cert  Unsteadiness on feet - Plan: PT plan of care cert/re-cert  Difficulty in walking, not elsewhere classified - Plan: PT plan of care cert/re-cert  Rationale for Evaluation and Treatment Rehabilitation  PERTINENT HISTORY: LBP, balance. Pt fell 02/17/2022 while trying to step up onto his half step to get into his home in the garage. Pt tripped on the rug which is no longer there. Pt fractured L hip S/P L hip hemiarthroplasty on 02/19/2022. Pt underwent home health PT for the hip rehab. Pt fell again a couple of weeks after participating in rehab resulting in lumbar fracture. Does not remember what caused the fall. Pt was doing something in the hall. Pt does not remember if he was using an AD. Pt had a shot in his back which took away the pain. Pt started using his SPC a couple of weeks ago. Home health PT ended last week. Independent ambulation prior to L hip fracture.  PRECAUTIONS: fall risk,   SUBJECTIVE: Wants to try a quad cane. R low back is bothering him. Low back bothers him when he stands up a long time such as when he is washing dishes.  PAIN:  Are you having pain? 0/10      TODAY'S TREATMENT:   Therapeutic exerise   Gait with NBQC on R side, cues for increasing R LE step length 75 ft  Pt states preferring the NBQC instead of his SPC   With SPC: Standing up from a chair, walking 10 ft forward, then returning 10 ft, then sitting back onto  chair 3x  22.07 seconds, 19.12 seconds, 19.87 seconds   (20.35 seconds average with SPC)  Seated manually resisted hip extension and hip abduction 1x each way for each L  Sit <> stand 5x with B UE assist on arm rest, eccentric lowering  Unable to perform without UE assist  Regular sit <> stand with B UE assist 5x  Then 5x with emphasis on forward weight shifting to bring his center of gravity (COG) over his base of support (BOS; feet)   Reviewed POC: 2x/week for 8 weeks   Reviewed progress/current status with PT towards goals    Standing with B scapular retraction 10x5 seconds for 3 sets, cues for COG  over BOS   Standign forward weight shift, cues for akle strategy 10x5 seconds        Gait from gym to car with SPC, cues for increasing step length.    Improved exercise technique, movement at target joints, use of target muscles after mod verbal, visual, tactile cues.         Response to treatment Pt tolerated session well without aggravation of symptoms.      Clinical impression   Pt demonstrates improved bilateral hip strength since initial evaluation. Able to perform TUG faster compared to previous measurement on 08/10/2022 after cues for increasing step length. Still demonstrates difficulty with sit <> stand, needing B UE assist as well as cues for increasing forward wegith shifting to bring his center of gravity over his base of support (feet). Worked on improving forward weight shifting and ankle strategy to decrease fall risk. Worked on improving thoracic extension in standing to decrease lumbar extension stress. Pt tolerated session well without aggravation of symptoms. Pt will benefit from continued skilled physical therapy services to decrease pain, improve strength, balance, and function.        PATIENT EDUCATION: Education details: there-ex, HEP Person educated: Patient Education method: Explanation, Demonstration, Tactile cues, Verbal cues, and  Handouts Education comprehension: verbalized understanding and returned demonstration   HOME EXERCISE PROGRAM:       Access Code: [email or web address] URL: [email or web address] Date: 07/05/2022 Prepared by: Joneen Boers    Exercises - Standing Hip Abduction with Counter Support  - 2 x daily - 7 x weekly - 1 sets - 10 reps - 5 seconds hold - Sit to Stand with Armchair  - 1 x daily - 3 x weekly - 2 sets - 8 reps - Scapular Retraction with Resistance  - 1 x daily - 3 x weekly - 3 sets - 12 reps The HEP added last session (07/19/2022) per pt: sit <> stand, stepping over obstacles, walking forward and back    PT Short Term Goals - 08/10/22 1019       PT SHORT TERM GOAL #1   Title Pt will be independent with his initial HEP to decrease pain, improve strength, balance, and function.    Baseline Pt has started his initial HEP (07/05/2022); 08/10/2022 reports independence compelting as prescribed.    Time 3    Period Weeks    Status Achieved    Target Date 08/10/22              PT Long Term Goals - 09/05/22 0942       PT LONG TERM GOAL #1   Title Patient will have a decrease in low back pain to 4/10 or less at worst to promote ability to perform standing tasks, ambulate, and negotiate stairs with less difficulty.    Baseline 10/10 low back pain at worst for the past 3 months (07/05/2022); denies back pain with standing, walking or stair negotiation; 4/10 at worst for the past 7 days (09/05/2022)    Time 8    Period Weeks    Status Achieved    Target Date 08/10/22      PT LONG TERM GOAL #2   Title Patient will improve his lumbar FOTO score by at least 10 points as a demonstration of improved function.    Baseline Lumbar spine FOTO 40 (07/05/2022); 52    Time 8    Period Weeks    Status Achieved    Target Date 08/10/22      PT LONG TERM  GOAL #3   Title Pt will improve his TUG score to 12 seconds or less without SPC as a demonstration of improved balance and functional  mobility.    Baseline 18.33 seconds average with SPC on R side (07/05/2022); 22.05 sec  with SPC on RUE; 09/05/2022)    Time 8    Period Weeks    Status On-going    Target Date 11/03/22      PT LONG TERM GOAL #4   Title Pt will improve bilateral bilateral hip extension and abduction to promote ability to perform standing tasks more steadily.    Baseline Hip extension 4+/5 R and L, hip abduction 4+/5 R, 4/5 L (07/05/2022); deferred for primary PT; hip extension 5/5 R and L, hip abduction 5/5 R and L (09/05/2022)    Time 8    Period Weeks    Status Achieved    Target Date 09/01/22      PT LONG TERM GOAL #5   Title Pt will improve 5xSTS without UE support to 14.8 sec or less to demonstrate clinically significant improvement for age matched norms for LE strength and reduced risk of falls.    Baseline 08/10/22: 27.29 with UE's on thighs standard height chair; B UE assist 14.18 seconds (09/05/2022)    Time 8    Period Weeks    Status On-going    Target Date 11/03/22              Plan - 09/05/22 0925     Clinical Impression Statement Pt demonstrates improved bilateral hip strength since initial evaluation. Able to perform TUG faster compared to previous measurement on 08/10/2022 after cues for increasing step length. Still demonstrates difficulty with sit <> stand, needing B UE assist as well as cues for increasing forward wegith shifting to bring his center of gravity over his base of support (feet). Worked on improving forward weight shifting and ankle strategy to decrease fall risk. Worked on improving thoracic extension in standing to decrease lumbar extension stress. Pt tolerated session well without aggravation of symptoms. Pt will benefit from continued skilled physical therapy services to decrease pain, improve strength, balance, and function.    Personal Factors and Comorbidities Age;Comorbidity 3+;Past/Current Experience;Time since onset of injury/illness/exacerbation    Comorbidities  A-fib, arthritis, basal cell CA, chronic kidney disease, dysrhythmia, HTN, MI, phlebitis, L hip hemiarthroplasty    Examination-Activity Limitations Stairs;Stand;Bend;Locomotion Level;Transfers    Stability/Clinical Decision Making Stable/Uncomplicated    Clinical Decision Making Low    Rehab Potential Fair    PT Frequency 2x / week    PT Duration 8 weeks    PT Treatment/Interventions Therapeutic exercise;Therapeutic activities;Stair training;Functional mobility training;Balance training;Neuromuscular re-education;Patient/family education;Manual techniques;Dry needling;Aquatic Therapy;Electrical Stimulation;Iontophoresis '4mg'$ /ml Dexamethasone;Gait training    PT Next Visit Plan posture, thoracic and hip extension, trunk and hip strengthening, manual techniques, modalities PRN    PT Home Exercise Plan Medbridge Access Code: [email or web address]    Consulted and Agree with Plan of Care Patient                     Joneen Boers PT, DPT   09/05/2022, 11:30 AM

## 2022-09-07 ENCOUNTER — Ambulatory Visit: Payer: Medicare Other

## 2022-09-07 DIAGNOSIS — R2681 Unsteadiness on feet: Secondary | ICD-10-CM

## 2022-09-07 DIAGNOSIS — M5459 Other low back pain: Secondary | ICD-10-CM

## 2022-09-07 DIAGNOSIS — R262 Difficulty in walking, not elsewhere classified: Secondary | ICD-10-CM

## 2022-09-07 NOTE — Therapy (Signed)
OUTPATIENT PHYSICAL THERAPY TREATMENT NOTE   Patient Name: Robert Rivas MRN: [id number] DOB:1941-08-19, 81 y.o., male Today's Date: 09/07/2022  PCP: Idelle Crouch, MD REFERRING PROVIDER: Idelle Crouch, MD   PT End of Session - 09/07/22 0917     Visit Number 14    Number of Visits 33    Date for PT Re-Evaluation 11/03/22    Authorization Type UHC Medicare Primary; Tricare for Life sceondary    Authorization Time Period 07/05/22-09/01/22    PT Start Time 0919    PT Stop Time 1001    PT Time Calculation (min) 42 min    Equipment Utilized During Treatment Gait belt    Activity Tolerance Patient tolerated treatment well    Behavior During Therapy WFL for tasks assessed/performed                     Past Medical History:  Diagnosis Date   A-fib ([place])    Arthritis    lower back   Basal cell carcinoma 10/29/2020   Right post base of skull/neck   BPH (benign prostatic hyperplasia)    Cancer (HCC)    Skin Cancer   Chronic kidney disease    Chronic Kidney Disease   Coronary artery disease    Dysrhythmia    Atrial Fibrillation; Supraventricular Tachycardia   Gout    Hearing aid worn    has, does not wear   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   L upper back 5.0cm inf to base of neck, 5.0cm lat to spine   Hyperlipidemia    Hypertension    MI, old    Nocturia    Phlebitis    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   SCC IS R foreearm distal   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   SCC IS R forearm proximal   Squamous cell carcinoma of skin 03/26/2018   SCC IS R forearm   Squamous cell carcinoma of skin 01/01/2008   SCC IS R forearm   Wears dentures    partial   Past Surgical History:  Procedure Laterality Date   cardaic stents     CARDIAC CATHETERIZATION     PTCA with Stent Placement   CARDIOVERSION N/A 04/13/2021   Procedure: CARDIOVERSION;  Surgeon: Corey Skains, MD;  Location: [facility];  Service: Cardiovascular;  Laterality: N/A;    CARDIOVERSION N/A 05/05/2021   Procedure: CARDIOVERSION;  Surgeon: Corey Skains, MD;  Location: [facility];  Service: Cardiovascular;  Laterality: N/A;   COLONOSCOPY WITH PROPOFOL N/A 12/15/2015   Procedure: COLONOSCOPY WITH PROPOFOL;  Surgeon: Lollie Sails, MD;  Location: [facility] ENDOSCOPY;  Service: Endoscopy;  Laterality: N/A;   CORONARY ARTERY BYPASS GRAFT  1992   ECTROPION REPAIR Right 12/12/2017   Procedure: REPAIR OF ECTROPION SUTURES/EXTENSIVE RIGHT;  Surgeon: Karle Starch, MD;  Location: [place];  Service: Ophthalmology;  Laterality: Right;   HERNIA REPAIR     HIP ARTHROPLASTY Left 02/19/2022   Procedure: ARTHROPLASTY BIPOLAR HIP (HEMIARTHROPLASTY);  Surgeon: Thornton Park, MD;  Location: [facility];  Service: Orthopedics;  Laterality: Left;   IR KYPHO LUMBAR INC FX REDUCE BONE BX UNI/BIL CANNULATION INC/IMAGING  06/07/2022   IR RADIOLOGIST EVAL & MGMT  06/30/2022   SVT ABLATION  02/22/2017   [facility]   Patient Active Problem List   Diagnosis Date Noted   Acute urinary retention 02/19/2022   Acute delirium 02/18/2022   Dysphagia 02/18/2022   Hyponatremia 02/15/2022  Closed left hip fracture ([place]) 02/14/2022   Atrial fibrillation with RVR ([place]) 04/12/2021   Bradycardia 07/08/2019   Venous insufficiency of both lower extremities 07/08/2019   Abnormal glucose 01/10/2019   Elevated PSA 01/10/2019   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) 01/20/2015   Atrial fibrillation ([place]) 05/13/2014   Coronary artery disease 05/13/2014   Chest pain 05/13/2014    Stage 3a chronic kidney disease (CKD) ([place]) 05/13/2014   Essential (primary) hypertension 05/13/2014   History of cardiac catheterization 05/13/2014   HLD (hyperlipidemia) 05/13/2014   Combined fat and carbohydrate induced hyperlipemia 05/13/2014   History of cardiovascular surgery 05/13/2014   History of anticoagulant therapy 04/28/2014   Warfarin anticoagulation 04/28/2014   Benign prostatic hyperplasia with urinary obstruction 11/05/2013    REFERRING DIAG: LBP  THERAPY DIAG:  Other low back pain  Unsteadiness on feet  Difficulty in walking, not elsewhere classified  Rationale for Evaluation and Treatment Rehabilitation  PERTINENT HISTORY: LBP, balance. Pt fell 02/17/2022 while trying to step up onto his half step to get into his home in the garage. Pt tripped on the rug which is no longer there. Pt fractured L hip S/P L hip hemiarthroplasty on 02/19/2022. Pt underwent home health PT for the hip rehab. Pt fell again a couple of weeks after participating in rehab resulting in lumbar fracture. Does not remember what caused the fall. Pt was doing something in the hall. Pt does not remember if he was using an AD. Pt had a shot in his back which took away the pain. Pt started using his SPC a couple of weeks ago. Home health PT ended last week. Independent ambulation prior to L hip fracture.  PRECAUTIONS: fall risk,   SUBJECTIVE: Back is doing ok, no pain currently. When his back bothers him, it's in his R side (around L1/2 area)   PAIN:  Are you having pain? 0/10      TODAY'S TREATMENT:   Therapeutic exerise   Gait with SPC on R side, cues for increasing R LE step length 208 ft  Sit <> stand with B UE assist 5x2 with emphasis on forward weight shifting, minimal use of hands.   Side stepping with B UE assist on parallel bar 5 ft to the R and 30f to the L 5x  Then with yellow band around ankles 4x  Standing B shoulder extension, yellow band 10x5 seconds, then 8x5 seconds to  promote thoracic extension, upright posture, and trunk strength   Forward step up onto first regular step with contralateral UE assist   R 10x2  L 10x2   Standing forward weight shift, cues for akle strategy 10x5 seconds for 2 sets  Work on ascending and descending stairs next visit if appropriate.       [facility].    Improved exercise technique, movement at target joints, use of target muscles after mod verbal, visual, tactile cues.         Response to treatment Pt tolerated session well without aggravation of symptoms.  Clinical impression   Continued working up upright posture, thoracic extension, trunk and glute strength and general LE strengthening to decrease low back stress as well as improving balance. Continued working on improving forward weight shifting and ankle strategy to decrease fall risk. Pt tolerated session well without aggravation of symptoms. Pt will benefit from continued skilled physical therapy services to decrease pain, improve strength, balance, and function.        PATIENT EDUCATION: Education details: there-ex, HEP Person educated: Patient Education method: Explanation, Demonstration, Tactile cues, Verbal cues, and Handouts Education comprehension: verbalized understanding and returned demonstration   HOME EXERCISE PROGRAM:       Access Code: [email or web address] URL: [email or web address] Date: 07/05/2022 Prepared by: Joneen Boers    Exercises - Standing Hip Abduction with Counter Support  - 2 x daily - 7 x weekly - 1 sets - 10 reps - 5 seconds hold - Sit to Stand with Armchair  - 1 x daily - 3 x weekly - 2 sets - 8 reps - Scapular Retraction with Resistance  - 1 x daily - 3 x weekly - 3 sets - 12 reps The HEP added last session (07/19/2022) per pt: sit <> stand, stepping over obstacles, walking forward and back    PT Short Term Goals - 08/10/22 1019       PT SHORT  TERM GOAL #1   Title Pt will be independent with his initial HEP to decrease pain, improve strength, balance, and function.    Baseline Pt has started his initial HEP (07/05/2022); 08/10/2022 reports independence compelting as prescribed.    Time 3    Period Weeks    Status Achieved    Target Date 08/10/22              PT Long Term Goals - 09/05/22 0942       PT LONG TERM GOAL #1   Title Patient will have a decrease in low back pain to 4/10 or less at worst to promote ability to perform standing tasks, ambulate, and negotiate stairs with less difficulty.    Baseline 10/10 low back pain at worst for the past 3 months (07/05/2022); denies back pain with standing, walking or stair negotiation; 4/10 at worst for the past 7 days (09/05/2022)    Time 8    Period Weeks    Status Achieved    Target Date 08/10/22      PT LONG TERM GOAL #2   Title Patient will improve his lumbar FOTO score by at least 10 points as a demonstration of improved function.    Baseline Lumbar spine FOTO 40 (07/05/2022); 52    Time 8    Period Weeks    Status Achieved    Target Date 08/10/22      PT LONG TERM GOAL #3   Title Pt will improve his TUG score to 12 seconds or less without SPC as a demonstration of improved balance and functional mobility.    Baseline 18.33 seconds average with SPC on R side (07/05/2022); 22.05 sec  with SPC on RUE; 09/05/2022)    Time 8    Period Weeks    Status On-going    Target Date 11/03/22      PT LONG TERM GOAL #4   Title Pt will improve bilateral bilateral hip extension and abduction to promote ability to perform standing tasks more steadily.    Baseline Hip extension 4+/5 R and L, hip abduction 4+/5 R, 4/5 L (07/05/2022); deferred for primary PT;  hip extension 5/5 R and L, hip abduction 5/5 R and L (09/05/2022)    Time 8    Period Weeks    Status Achieved    Target Date 09/01/22      PT LONG TERM GOAL #5   Title Pt will improve 5xSTS without UE support to 14.8 sec or less  to demonstrate clinically significant improvement for age matched norms for LE strength and reduced risk of falls.    Baseline 08/10/22: 27.29 with UE's on thighs standard height chair; B UE assist 14.18 seconds (09/05/2022)    Time 8    Period Weeks    Status On-going    Target Date 11/03/22              Plan - 09/07/22 0916     Clinical Impression Statement Continued working up upright posture, thoracic extension, trunk and glute strength and general LE strengthening to decrease low back stress as well as improving balance. Continued working on improving forward weight shifting and ankle strategy to decrease fall risk. Pt tolerated session well without aggravation of symptoms. Pt will benefit from continued skilled physical therapy services to decrease pain, improve strength, balance, and function.    Personal Factors and Comorbidities Age;Comorbidity 3+;Past/Current Experience;Time since onset of injury/illness/exacerbation    Comorbidities A-fib, arthritis, basal cell CA, chronic kidney disease, dysrhythmia, HTN, MI, phlebitis, L hip hemiarthroplasty    Examination-Activity Limitations Stairs;Stand;Bend;Locomotion Level;Transfers    Stability/Clinical Decision Making Stable/Uncomplicated    Clinical Decision Making Low    Rehab Potential Fair    PT Frequency 2x / week    PT Duration 8 weeks    PT Treatment/Interventions Therapeutic exercise;Therapeutic activities;Stair training;Functional mobility training;Balance training;Neuromuscular re-education;Patient/family education;Manual techniques;Dry needling;Aquatic Therapy;Electrical Stimulation;Iontophoresis '4mg'$ /ml Dexamethasone;Gait training    PT Next Visit Plan posture, thoracic and hip extension, trunk and hip strengthening, manual techniques, modalities PRN    PT Home Exercise Plan Medbridge Access Code: [email or web address]    Consulted and Agree with Plan of Care Patient                      Joneen Boers PT, DPT   09/07/2022,  11:23 AM

## 2022-09-12 ENCOUNTER — Ambulatory Visit: Payer: Medicare Other

## 2022-09-12 DIAGNOSIS — R2681 Unsteadiness on feet: Secondary | ICD-10-CM

## 2022-09-12 DIAGNOSIS — R262 Difficulty in walking, not elsewhere classified: Secondary | ICD-10-CM

## 2022-09-12 DIAGNOSIS — M5459 Other low back pain: Secondary | ICD-10-CM | POA: Diagnosis not present

## 2022-09-12 NOTE — Therapy (Signed)
OUTPATIENT PHYSICAL THERAPY TREATMENT NOTE   Patient Name: Robert Rivas MRN: [id number] DOB:[birth year], 81 y.o., male Today's Date: 09/12/2022  PCP: Idelle Crouch, MD REFERRING PROVIDER: Idelle Crouch, MD   PT End of Session - 09/12/22 0933     Visit Number 15    Number of Visits 82    Date for PT Re-Evaluation 11/03/22    Authorization Type [employer] Medicare Primary; [employer] sceondary    Authorization Time Period 07/05/22-09/01/22    PT Start Time 0933    PT Stop Time 1012    PT Time Calculation (min) 39 min    Equipment Utilized During Treatment Gait belt    Activity Tolerance Patient tolerated treatment well    Behavior During Therapy WFL for tasks assessed/performed                      Past Medical History:  Diagnosis Date   A-fib ([place])    Arthritis    lower back   Basal cell carcinoma 10/29/2020   Right post base of skull/neck   BPH (benign prostatic hyperplasia)    Cancer (HCC)    Skin Cancer   Chronic kidney disease    Chronic Kidney Disease   Coronary artery disease    Dysrhythmia    Atrial Fibrillation; Supraventricular Tachycardia   Gout    Hearing aid worn    has, does not wear   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   L upper back 5.0cm inf to base of neck, 5.0cm lat to spine   Hyperlipidemia    Hypertension    MI, old    Nocturia    Phlebitis    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   SCC IS R foreearm distal   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   SCC IS R forearm proximal   Squamous cell carcinoma of skin 03/26/2018   SCC IS R forearm   Squamous cell carcinoma of skin 01/01/2008   SCC IS R forearm   Wears dentures    partial   Past Surgical History:  Procedure Laterality Date   cardaic stents     CARDIAC CATHETERIZATION     PTCA with Stent Placement   CARDIOVERSION N/A 04/13/2021   Procedure: CARDIOVERSION;  Surgeon: Corey Skains, MD;  Location: [facility];  Service: Cardiovascular;  Laterality: N/A;    CARDIOVERSION N/A 05/05/2021   Procedure: CARDIOVERSION;  Surgeon: Corey Skains, MD;  Location: [facility];  Service: Cardiovascular;  Laterality: N/A;   COLONOSCOPY WITH PROPOFOL N/A 12/15/2015   Procedure: COLONOSCOPY WITH PROPOFOL;  Surgeon: Lollie Sails, MD;  Location: [facility] ENDOSCOPY;  Service: Endoscopy;  Laterality: N/A;   CORONARY ARTERY BYPASS GRAFT  1992   ECTROPION REPAIR Right 12/12/2017   Procedure: REPAIR OF ECTROPION SUTURES/EXTENSIVE RIGHT;  Surgeon: Karle Starch, MD;  Location: [facility];  Service: Ophthalmology;  Laterality: Right;   HERNIA REPAIR     HIP ARTHROPLASTY Left 02/19/2022   Procedure: ARTHROPLASTY BIPOLAR HIP (HEMIARTHROPLASTY);  Surgeon: Thornton Park, MD;  Location: [facility];  Service: Orthopedics;  Laterality: Left;   IR KYPHO LUMBAR INC FX REDUCE BONE BX UNI/BIL CANNULATION INC/IMAGING  06/07/2022   IR RADIOLOGIST EVAL & MGMT  06/30/2022   SVT ABLATION  02/22/2017   [facility]   Patient Active Problem List   Diagnosis Date Noted   Acute urinary retention 02/19/2022   Acute delirium 02/18/2022   Dysphagia 02/18/2022   Hyponatremia 02/15/2022  Closed left hip fracture ([place]) 02/14/2022   Atrial fibrillation with RVR ([place]) 04/12/2021   Bradycardia 07/08/2019   Venous insufficiency of both lower extremities 07/08/2019   Abnormal glucose 01/10/2019   Elevated PSA 01/10/2019   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   Palpitations 06/13/2018   Leg edema, right 09/20/2017   History of BPH 02/21/2017   BPH (benign prostatic hyperplasia) 04/25/2016   Nocturia 04/25/2016   Gout 03/25/2016   History of colon polyps 03/25/2016   Inflammation of a vein 03/25/2016   Percutaneous transluminal coronary angioplasty status 03/25/2016   Supraventricular tachycardia ([place]) 03/25/2016   Benign essential HTN 06/10/2015   Paroxysmal atrial fibrillation ([place]) 01/20/2015   Atrial fibrillation ([place]) 05/13/2014   Coronary artery disease 05/13/2014   Chest pain 05/13/2014    Stage 3a chronic kidney disease (CKD) ([place]) 05/13/2014   Essential (primary) hypertension 05/13/2014   History of cardiac catheterization 05/13/2014   HLD (hyperlipidemia) 05/13/2014   Combined fat and carbohydrate induced hyperlipemia 05/13/2014   History of cardiovascular surgery 05/13/2014   History of anticoagulant therapy 04/28/2014   Warfarin anticoagulation 04/28/2014   Benign prostatic hyperplasia with urinary obstruction 11/05/2013    REFERRING DIAG: LBP  THERAPY DIAG:  Unsteadiness on feet  Other low back pain  Difficulty in walking, not elsewhere classified  Rationale for Evaluation and Treatment Rehabilitation  PERTINENT HISTORY: LBP, balance. Pt fell 02/17/2022 while trying to step up onto his half step to get into his home in the garage. Pt tripped on the rug which is no longer there. Pt fractured L hip S/P L hip hemiarthroplasty on 02/19/2022. Pt underwent home health PT for the hip rehab. Pt fell again a couple of weeks after participating in rehab resulting in lumbar fracture. Does not remember what caused the fall. Pt was doing something in the hall. Pt does not remember if he was using an AD. Pt had a shot in his back which took away the pain. Pt started using his SPC a couple of weeks ago. Home health PT ended last week. Independent ambulation prior to L hip fracture.  PRECAUTIONS: fall risk,   SUBJECTIVE: Back is doing ok, no pain currently. Did not bother him this weekend and was amazed.    PAIN:  Are you having pain? 0/10      TODAY'S TREATMENT:   Therapeutic exerise   Gait with SPC on R side, cues for increasing R LE step length 100 ft  Sit <> stand with B UE assist 5x2 with emphasis on forward weight shifting, minimal use of hands.   Ascending and descending stairs with B UE assist, reciprocal gait pattern 2x  Cues for forward weight shifting secondary to backward lean when descending stairs.   Side stepping with B UE assist on parallel bar 5 ft  to the R and 24f to the L 5x  Then with yellow band around ankles 5x  Good muscle use observed.   Standing B shoulder extension, yellow band 10x5 seconds for 2 sets   Standing forward weight shift, cues for akle strategy 10x5 seconds for 3 sets   Gait from gym to car with [employer] cues for increasing step length.    Improved exercise technique, movement at target joints, use of target muscles after mod verbal, visual, tactile cues.         Response to treatment Pt tolerated session well without aggravation of symptoms.      Clinical impression  Pt making very good progress with decreased low back pain based  on reports of low back not bothering him this weekend. Continued working up upright posture, thoracic extension, trunk and glute strength and general LE strengthening to decrease low back stress as well as improve balance. Continued working on improving forward weight shifting and ankle strategy to decrease fall risk. Pt tolerated session well without aggravation of symptoms. Pt will benefit from continued skilled physical therapy services to decrease pain, improve strength, balance, and function.        PATIENT EDUCATION: Education details: there-ex, HEP Person educated: Patient Education method: Explanation, Demonstration, Tactile cues, Verbal cues, and Handouts Education comprehension: verbalized understanding and returned demonstration   HOME EXERCISE PROGRAM:       Access Code: [email or web address] URL: [email or web address] Date: 07/05/2022 Prepared by: Joneen Boers    Exercises - Standing Hip Abduction with Counter Support  - 2 x daily - 7 x weekly - 1 sets - 10 reps - 5 seconds hold - Sit to Stand with Armchair  - 1 x daily - 3 x weekly - 2 sets - 8 reps - Scapular Retraction with Resistance  - 1 x daily - 3 x weekly - 3 sets - 12 reps The HEP added last session (07/19/2022) per pt: sit <> stand, stepping over obstacles, walking forward and back    PT  Short Term Goals - 08/10/22 1019       PT SHORT TERM GOAL #1   Title Pt will be independent with his initial HEP to decrease pain, improve strength, balance, and function.    Baseline Pt has started his initial HEP (07/05/2022); 08/10/2022 reports independence compelting as prescribed.    Time 3    Period Weeks    Status Achieved    Target Date 08/10/22              PT Long Term Goals - 09/05/22 0942       PT LONG TERM GOAL #1   Title Patient will have a decrease in low back pain to 4/10 or less at worst to promote ability to perform standing tasks, ambulate, and negotiate stairs with less difficulty.    Baseline 10/10 low back pain at worst for the past 3 months (07/05/2022); denies back pain with standing, walking or stair negotiation; 4/10 at worst for the past 7 days (09/05/2022)    Time 8    Period Weeks    Status Achieved    Target Date 08/10/22      PT LONG TERM GOAL #2   Title Patient will improve his lumbar FOTO score by at least 10 points as a demonstration of improved function.    Baseline Lumbar spine FOTO 40 (07/05/2022); 52    Time 8    Period Weeks    Status Achieved    Target Date 08/10/22      PT LONG TERM GOAL #3   Title Pt will improve his TUG score to 12 seconds or less without SPC as a demonstration of improved balance and functional mobility.    Baseline 18.33 seconds average with SPC on R side (07/05/2022); 22.05 sec  with SPC on RUE; 09/05/2022)    Time 8    Period Weeks    Status On-going    Target Date 11/03/22      PT LONG TERM GOAL #4   Title Pt will improve bilateral bilateral hip extension and abduction to promote ability to perform standing tasks more steadily.    Baseline Hip extension 4+/5 R and L, hip abduction 4+/5 R, 4/5  L (07/05/2022); deferred for primary PT; hip extension 5/5 R and L, hip abduction 5/5 R and L (09/05/2022)    Time 8    Period Weeks    Status Achieved    Target Date 09/01/22      PT LONG TERM GOAL #5   Title Pt will  improve 5xSTS without UE support to 14.8 sec or less to demonstrate clinically significant improvement for age matched norms for LE strength and reduced risk of falls.    Baseline 08/10/22: 27.29 with UE's on thighs standard height chair; B UE assist 14.18 seconds (09/05/2022)    Time 8    Period Weeks    Status On-going    Target Date 11/03/22              Plan - 09/12/22 0933     Clinical Impression Statement Pt making very good progress with decreased low back pain based on reports of low back not bothering him this weekend. Continued working up upright posture, thoracic extension, trunk and glute strength and general LE strengthening to decrease low back stress as well as improve balance. Continued working on improving forward weight shifting and ankle strategy to decrease fall risk. Pt tolerated session well without aggravation of symptoms. Pt will benefit from continued skilled physical therapy services to decrease pain, improve strength, balance, and function.    Personal Factors and Comorbidities Age;Comorbidity 3+;Past/Current Experience;Time since onset of injury/illness/exacerbation    Comorbidities A-fib, arthritis, basal cell CA, chronic kidney disease, dysrhythmia, HTN, MI, phlebitis, L hip hemiarthroplasty    Examination-Activity Limitations Stairs;Stand;Bend;Locomotion Level;Transfers    Stability/Clinical Decision Making Stable/Uncomplicated    Clinical Decision Making Low    Rehab Potential Fair    PT Frequency 2x / week    PT Duration 8 weeks    PT Treatment/Interventions Therapeutic exercise;Therapeutic activities;Stair training;Functional mobility training;Balance training;Neuromuscular re-education;Patient/family education;Manual techniques;Dry needling;Aquatic Therapy;Electrical Stimulation;Iontophoresis '4mg'$ /ml Dexamethasone;Gait training    PT Next Visit Plan posture, thoracic and hip extension, trunk and hip strengthening, manual techniques, modalities PRN    PT Home  Exercise Plan Medbridge Access Code: [email or web address]    Consulted and Agree with Plan of Care Patient                       Joneen Boers PT, DPT   09/12/2022, 1:13 PM

## 2022-09-14 ENCOUNTER — Ambulatory Visit: Payer: Medicare Other

## 2022-09-14 DIAGNOSIS — R262 Difficulty in walking, not elsewhere classified: Secondary | ICD-10-CM

## 2022-09-14 DIAGNOSIS — R2681 Unsteadiness on feet: Secondary | ICD-10-CM

## 2022-09-14 DIAGNOSIS — M5459 Other low back pain: Secondary | ICD-10-CM

## 2022-09-14 NOTE — Therapy (Signed)
OUTPATIENT PHYSICAL THERAPY TREATMENT NOTE   Patient Name: Robert Rivas MRN: [id number] DOB:06-Apr-1941, 81 y.o., male Today's Date: 09/14/2022  PCP: Idelle Crouch, MD REFERRING PROVIDER: Idelle Crouch, MD   PT End of Session - 09/14/22 0934     Visit Number 16    Number of Visits 95    Date for PT Re-Evaluation 11/03/22    Authorization Type UHC Medicare Primary; Tricare for Life sceondary    Authorization Time Period 07/05/22-09/01/22    PT Start Time 0934    PT Stop Time 1015    PT Time Calculation (min) 41 min    Equipment Utilized During Treatment Gait belt    Activity Tolerance Patient tolerated treatment well    Behavior During Therapy WFL for tasks assessed/performed                       Past Medical History:  Diagnosis Date   A-fib ([place])    Arthritis    lower back   Basal cell carcinoma 10/29/2020   Right post base of skull/neck   BPH (benign prostatic hyperplasia)    Cancer (HCC)    Skin Cancer   Chronic kidney disease    Chronic Kidney Disease   Coronary artery disease    Dysrhythmia    Atrial Fibrillation; Supraventricular Tachycardia   Gout    Hearing aid worn    has, does not wear   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   L upper back 5.0cm inf to base of neck, 5.0cm lat to spine   Hyperlipidemia    Hypertension    MI, old    Nocturia    Phlebitis    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   SCC IS R foreearm distal   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   SCC IS R forearm proximal   Squamous cell carcinoma of skin 03/26/2018   SCC IS R forearm   Squamous cell carcinoma of skin 01/01/2008   SCC IS R forearm   Wears dentures    partial   Past Surgical History:  Procedure Laterality Date   cardaic stents     CARDIAC CATHETERIZATION     PTCA with Stent Placement   CARDIOVERSION N/A 04/13/2021   Procedure: CARDIOVERSION;  Surgeon: Corey Skains, MD;  Location: [facility] ORS;  Service: Cardiovascular;  Laterality:  N/A;   CARDIOVERSION N/A 05/05/2021   Procedure: CARDIOVERSION;  Surgeon: Corey Skains, MD;  Location: [facility];  Service: Cardiovascular;  Laterality: N/A;   COLONOSCOPY WITH PROPOFOL N/A 12/15/2015   Procedure: COLONOSCOPY WITH PROPOFOL;  Surgeon: Lollie Sails, MD;  Location: [facility] ENDOSCOPY;  Service: Endoscopy;  Laterality: N/A;   CORONARY ARTERY BYPASS GRAFT  1992   ECTROPION REPAIR Right 12/12/2017   Procedure: REPAIR OF ECTROPION SUTURES/EXTENSIVE RIGHT;  Surgeon: Karle Starch, MD;  Location: [facility];  Service: Ophthalmology;  Laterality: Right;   HERNIA REPAIR     HIP ARTHROPLASTY Left 02/19/2022   Procedure: ARTHROPLASTY BIPOLAR HIP (HEMIARTHROPLASTY);  Surgeon: Thornton Park, MD;  Location: [facility];  Service: Orthopedics;  Laterality: Left;   IR KYPHO LUMBAR INC FX REDUCE BONE BX UNI/BIL CANNULATION INC/IMAGING  06/07/2022   IR RADIOLOGIST EVAL & MGMT  06/30/2022   SVT ABLATION  02/22/2017   [facility]   Patient Active Problem List   Diagnosis Date Noted   Acute urinary retention 02/19/2022   Acute delirium 02/18/2022   Dysphagia 02/18/2022   Hyponatremia 02/15/2022  Closed left hip fracture ([place]) 02/14/2022   Atrial fibrillation with RVR ([place]) 04/12/2021   Bradycardia 07/08/2019   Venous insufficiency of both lower extremities 07/08/2019   Abnormal glucose 01/10/2019   Elevated PSA 01/10/2019   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   Palpitations 06/13/2018   Leg edema, right 09/20/2017   History of BPH 02/21/2017   BPH (benign prostatic hyperplasia) 04/25/2016   Nocturia 04/25/2016   Gout 03/25/2016   History of colon polyps 03/25/2016   Inflammation of a vein 03/25/2016   Percutaneous transluminal coronary angioplasty status 03/25/2016   Supraventricular tachycardia ([place]) 03/25/2016   Benign essential HTN 06/10/2015   Paroxysmal atrial fibrillation ([place]) 01/20/2015   Atrial fibrillation ([place]) 05/13/2014   Coronary artery disease 05/13/2014   Chest pain  05/13/2014   Stage 3a chronic kidney disease (CKD) ([place]) 05/13/2014   Essential (primary) hypertension 05/13/2014   History of cardiac catheterization 05/13/2014   HLD (hyperlipidemia) 05/13/2014   Combined fat and carbohydrate induced hyperlipemia 05/13/2014   History of cardiovascular surgery 05/13/2014   History of anticoagulant therapy 04/28/2014   Warfarin anticoagulation 04/28/2014   Benign prostatic hyperplasia with urinary obstruction 11/05/2013    REFERRING DIAG: LBP  THERAPY DIAG:  Unsteadiness on feet  Other low back pain  Difficulty in walking, not elsewhere classified  Rationale for Evaluation and Treatment Rehabilitation  PERTINENT HISTORY: LBP, balance. Pt fell 02/17/2022 while trying to step up onto his half step to get into his home in the garage. Pt tripped on the rug which is no longer there. Pt fractured L hip S/P L hip hemiarthroplasty on 02/19/2022. Pt underwent home health PT for the hip rehab. Pt fell again a couple of weeks after participating in rehab resulting in lumbar fracture. Does not remember what caused the fall. Pt was doing something in the hall. Pt does not remember if he was using an AD. Pt had a shot in his back which took away the pain. Pt started using his SPC a couple of weeks ago. Home health PT ended last week. Independent ambulation prior to L hip fracture.  PRECAUTIONS: fall risk,   SUBJECTIVE: Back is doing ok. Has stiffness R low back, no pain. Was fine after last session.   PAIN:  Are you having pain? 0/10      TODAY'S TREATMENT:   Therapeutic exerise   Gait with SPC on R side, cues for increasing R LE step length 100 ft  Seated B scapular retraction 10x5 seconds   Sit <> stand with B UE assist 5x2 with emphasis on forward weight shifting, minimal use of hands.   Improved forward weight shifting  and mechanics observed.   Static mini lunge with contralateral UE assist to promote LE strengthening and forward weight shifting.    R 10x  L 10x  Standing forward weight shift, cues for akle strategy 10x5 seconds for 3 sets  Standing gastroc stretch to promote  ROM for ankle strategy  R 30 seconds x 3  L 30 seconds x3  Ascending and descending stairs with B UE assist, reciprocal gait pattern 2x  Cues for forward weight shifting secondary to backward lean when descending stairs.   Improved ability to forward weight shift compared to previous session.   Side stepping with B UE assist on parallel bar 5 ft to the R and 47f to the L with yellow band around ankles 5x to promote glute med muscle strengthening to decrease stress to low back during gait.    Standing B shoulder  extension, yellow band 10x5 seconds for 2 sets to promote trunk strength and decrease stress to low back   Gait from gym to car with SPC, cues for increasing step length.         Improved exercise technique, movement at target joints, use of target muscles after mod verbal, visual, tactile cues.         Response to treatment Pt tolerated session well without aggravation of symptoms.      Clinical impression Improved ability to perform forward weight shifting with transfers, brining his center of gravity over his base of support to safely perform sit <> stands. Improved forward weight shifting when descending stairs with B UE assist  to counter excessive backward lean to promote safety when performing the task. Continued working up upright posture, thoracic extension, trunk and glute strength and general LE strengthening to decrease low back stress as well as improve balance. Continued working on improving forward weight shifting and ankle strategy to decrease fall risk. Pt tolerated session well without aggravation of symptoms. Pt will benefit from continued skilled physical therapy services to decrease pain, improve strength, balance, and function.        PATIENT EDUCATION: Education details: there-ex, HEP Person educated:  Patient Education method: Explanation, Demonstration, Tactile cues, Verbal cues, and Handouts Education comprehension: verbalized understanding and returned demonstration   HOME EXERCISE PROGRAM:       Access Code: [email or web address] URL: [email or web address] Date: 07/05/2022 Prepared by: Joneen Boers    Exercises - Standing Hip Abduction with Counter Support  - 2 x daily - 7 x weekly - 1 sets - 10 reps - 5 seconds hold - Sit to Stand with Armchair  - 1 x daily - 3 x weekly - 2 sets - 8 reps - Scapular Retraction with Resistance  - 1 x daily - 3 x weekly - 3 sets - 12 reps The HEP added last session (07/19/2022) per pt: sit <> stand, stepping over obstacles, walking forward and back    PT Short Term Goals - 08/10/22 1019       PT SHORT TERM GOAL #1   Title Pt will be independent with his initial HEP to decrease pain, improve strength, balance, and function.    Baseline Pt has started his initial HEP (07/05/2022); 08/10/2022 reports independence compelting as prescribed.    Time 3    Period Weeks    Status Achieved    Target Date 08/10/22              PT Long Term Goals - 09/05/22 0942       PT LONG TERM GOAL #1   Title Patient will have a decrease in low back pain to 4/10 or less at worst to promote ability to perform standing tasks, ambulate, and negotiate stairs with less difficulty.    Baseline 10/10 low back pain at worst for the past 3 months (07/05/2022); denies back pain with standing, walking or stair negotiation; 4/10 at worst for the past 7 days (09/05/2022)    Time 8    Period Weeks    Status Achieved    Target Date 08/10/22      PT LONG TERM GOAL #2   Title Patient will improve his lumbar FOTO score by at least 10 points as a demonstration of improved function.    Baseline Lumbar spine FOTO 40 (07/05/2022); 52    Time 8    Period Weeks    Status Achieved    Target Date 08/10/22  PT LONG TERM GOAL #3   Title Pt will improve his TUG score to 12  seconds or less without SPC as a demonstration of improved balance and functional mobility.    Baseline 18.33 seconds average with SPC on R side (07/05/2022); 22.05 sec  with SPC on RUE; 09/05/2022)    Time 8    Period Weeks    Status On-going    Target Date 11/03/22      PT LONG TERM GOAL #4   Title Pt will improve bilateral bilateral hip extension and abduction to promote ability to perform standing tasks more steadily.    Baseline Hip extension 4+/5 R and L, hip abduction 4+/5 R, 4/5 L (07/05/2022); deferred for primary PT; hip extension 5/5 R and L, hip abduction 5/5 R and L (09/05/2022)    Time 8    Period Weeks    Status Achieved    Target Date 09/01/22      PT LONG TERM GOAL #5   Title Pt will improve 5xSTS without UE support to 14.8 sec or less to demonstrate clinically significant improvement for age matched norms for LE strength and reduced risk of falls.    Baseline 08/10/22: 27.29 with UE's on thighs standard height chair; B UE assist 14.18 seconds (09/05/2022)    Time 8    Period Weeks    Status On-going    Target Date 11/03/22              Plan - 09/14/22 0934     Clinical Impression Statement Improved ability to perform forward weight shifting with transfers, brining his center of gravity over his base of support to safely perform sit <> stands. Improved forward weight shifting when descending stairs with B UE assist  to counter excessive backward lean to promote safety when performing the task. Continued working up upright posture, thoracic extension, trunk and glute strength and general LE strengthening to decrease low back stress as well as improve balance. Continued working on improving forward weight shifting and ankle strategy to decrease fall risk. Pt tolerated session well without aggravation of symptoms. Pt will benefit from continued skilled physical therapy services to decrease pain, improve strength, balance, and function.    Personal Factors and Comorbidities  Age;Comorbidity 3+;Past/Current Experience;Time since onset of injury/illness/exacerbation    Comorbidities A-fib, arthritis, basal cell CA, chronic kidney disease, dysrhythmia, HTN, MI, phlebitis, L hip hemiarthroplasty    Examination-Activity Limitations Stairs;Stand;Bend;Locomotion Level;Transfers    Stability/Clinical Decision Making Stable/Uncomplicated    Clinical Decision Making Low    Rehab Potential Fair    PT Frequency 2x / week    PT Duration 8 weeks    PT Treatment/Interventions Therapeutic exercise;Therapeutic activities;Stair training;Functional mobility training;Balance training;Neuromuscular re-education;Patient/family education;Manual techniques;Dry needling;Aquatic Therapy;Electrical Stimulation;Iontophoresis '4mg'$ /ml Dexamethasone;Gait training    PT Next Visit Plan posture, thoracic and hip extension, trunk and hip strengthening, manual techniques, modalities PRN    PT Home Exercise Plan Medbridge Access Code: [email or web address]    Consulted and Agree with Plan of Care Patient                        Joneen Boers PT, DPT   09/14/2022, 3:39 PM

## 2022-09-19 ENCOUNTER — Ambulatory Visit: Payer: Medicare Other

## 2022-09-21 ENCOUNTER — Ambulatory Visit: Payer: Medicare Other

## 2022-09-21 DIAGNOSIS — R2681 Unsteadiness on feet: Secondary | ICD-10-CM

## 2022-09-21 DIAGNOSIS — M5459 Other low back pain: Secondary | ICD-10-CM

## 2022-09-21 DIAGNOSIS — R262 Difficulty in walking, not elsewhere classified: Secondary | ICD-10-CM

## 2022-09-21 NOTE — Therapy (Signed)
OUTPATIENT PHYSICAL THERAPY TREATMENT NOTE   Patient Name: Robert Rivas MRN: [id number] DOB:Mar 31, 1941, 81 y.o., male Today's Date: 09/21/2022  PCP: Idelle Crouch, MD REFERRING PROVIDER: Idelle Crouch, MD   PT End of Session - 09/21/22 0934     Visit Number 17    Number of Visits 74    Date for PT Re-Evaluation 11/03/22    Authorization Type UHC Medicare Primary; Tricare for Life sceondary    Authorization Time Period 07/05/22-09/01/22    PT Start Time 0934    PT Stop Time 1014    PT Time Calculation (min) 40 min    Equipment Utilized During Treatment Gait belt    Activity Tolerance Patient tolerated treatment well    Behavior During Therapy WFL for tasks assessed/performed                        Past Medical History:  Diagnosis Date   A-fib ([place])    Arthritis    lower back   Basal cell carcinoma 10/29/2020   Right post base of skull/neck   BPH (benign prostatic hyperplasia)    Cancer (HCC)    Skin Cancer   Chronic kidney disease    Chronic Kidney Disease   Coronary artery disease    Dysrhythmia    Atrial Fibrillation; Supraventricular Tachycardia   Gout    Hearing aid worn    has, does not wear   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   L upper back 5.0cm inf to base of neck, 5.0cm lat to spine   Hyperlipidemia    Hypertension    MI, old    Nocturia    Phlebitis    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   SCC IS R foreearm distal   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   SCC IS R forearm proximal   Squamous cell carcinoma of skin 03/26/2018   SCC IS R forearm   Squamous cell carcinoma of skin 01/01/2008   SCC IS R forearm   Wears dentures    partial   Past Surgical History:  Procedure Laterality Date   cardaic stents     CARDIAC CATHETERIZATION     PTCA with Stent Placement   CARDIOVERSION N/A 04/13/2021   Procedure: CARDIOVERSION;  Surgeon: Corey Skains, MD;  Location: [facility] ORS;  Service: Cardiovascular;  Laterality:  N/A;   CARDIOVERSION N/A 05/05/2021   Procedure: CARDIOVERSION;  Surgeon: Corey Skains, MD;  Location: [facility] ORS;  Service: Cardiovascular;  Laterality: N/A;   COLONOSCOPY WITH PROPOFOL N/A 12/15/2015   Procedure: COLONOSCOPY WITH PROPOFOL;  Surgeon: Lollie Sails, MD;  Location: [facility] END[facility]COPY;  Service: Endoscopy;  Laterality: N/A;   CORONARY ARTERY BYPASS GRAFT  1992   ECTROPION REPAIR Right 12/12/2017   Procedure: REPAIR OF ECTROPION SUTURES/EXTENSIVE RIGHT;  Surgeon: Karle Starch, MD;  Location: [facility];  Service: Ophthalmology;  Laterality: Right;   HERNIA REPAIR     HIP ARTHROPLASTY Left 02/19/2022   Procedure: ARTHROPLASTY BIPOLAR HIP (HEMIARTHROPLASTY);  Surgeon: Thornton Park, MD;  Location: [facility] ORS;  Service: Orthopedics;  Laterality: Left;   IR KYPHO LUMBAR INC FX REDUCE BONE BX UNI/BIL CANNULATION INC/IMAGING  06/07/2022   IR RADIOLOGIST EVAL & MGMT  06/30/2022   SVT ABLATION  02/22/2017   [facility]   Patient Active Problem List   Diagnosis Date Noted   Acute urinary retention 02/19/2022   Acute delirium 02/18/2022   Dysphagia 02/18/2022   Hyponatremia  02/15/2022   Closed left hip fracture ([place]) 02/14/2022   Atrial fibrillation with RVR ([place]) 04/12/2021   Bradycardia 07/08/2019   Venous insufficiency of both lower extremities 07/08/2019   Abnormal glucose 01/10/2019   Elevated PSA 01/10/2019   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   Palpitations 06/13/2018   Leg edema, right 09/20/2017   History of BPH 02/21/2017   BPH (benign prostatic hyperplasia) 04/25/2016   Nocturia 04/25/2016   Gout 03/25/2016   History of colon polyps 03/25/2016   Inflammation of a vein 03/25/2016   Percutaneous transluminal coronary angioplasty status 03/25/2016   Supraventricular tachycardia ([place]) 03/25/2016   Benign essential HTN 06/10/2015   Paroxysmal atrial fibrillation ([place]) 01/20/2015   Atrial fibrillation ([place]) 05/13/2014   Coronary artery disease 05/13/2014   Chest pain  05/13/2014   Stage 3a chronic kidney disease (CKD) ([place]) 05/13/2014   Essential (primary) hypertension 05/13/2014   History of cardiac catheterization 05/13/2014   HLD (hyperlipidemia) 05/13/2014   Combined fat and carbohydrate induced hyperlipemia 05/13/2014   History of cardiovascular surgery 05/13/2014   History of anticoagulant therapy 04/28/2014   Warfarin anticoagulation 04/28/2014   Benign prostatic hyperplasia with urinary obstruction 11/05/2013    REFERRING DIAG: LBP  THERAPY DIAG:  Unsteadiness on feet  Other low back pain  Difficulty in walking, not elsewhere classified  Rationale for Evaluation and Treatment Rehabilitation  PERTINENT HISTORY: LBP, balance. Pt fell 02/17/2022 while trying to step up onto his half step to get into his home in the garage. Pt tripped on the rug which is no longer there. Pt fractured L hip S/P L hip hemiarthroplasty on 02/19/2022. Pt underwent home health PT for the hip rehab. Pt fell again a couple of weeks after participating in rehab resulting in lumbar fracture. Does not remember what caused the fall. Pt was doing something in the hall. Pt does not remember if he was using an AD. Pt had a shot in his back which took away the pain. Pt started using his SPC a couple of weeks ago. Home health PT ended last week. Independent ambulation prior to L hip fracture.  PRECAUTIONS: fall risk,   SUBJECTIVE: Back is doing ok. Balance feels fairly good. Getting a little better. Seeing a back specialist first week of October.    PAIN:  Are you having pain? 2/10      TODAY'S TREATMENT:   Therapeutic exerise   Gait with SPC on R side, cues for increasing R LE step length 200 ft  Seated B scapular retraction 10x5 seconds for 2 sets   Sit <> stand with B UE assist 10x with emphasis on forward weight shifting, minimal use of hands.     Standing gastroc stretch to promote  ROM for ankle strategy  R 30 seconds x 3  L 30 seconds x3   No back pain  after aforementioned exercises    Static mini lunge with contralateral UE assist to promote LE strengthening and forward weight shifting.   R 10x  L 10x   R low back discomfort   Standing B shoulder extension, yellow band 10x5 seconds for 3 sets to promote trunk strength and decrease stress to low back   SLS with contralateral UE assist   R 10x5 seconds   L 10x5 seconds    Gait from gym to car with SPC, cues for increasing step length.         Improved exercise technique, movement at target joints, use of target muscles after mod verbal, visual, tactile cues.  Response to treatment Pt tolerated session well without aggravation of symptoms. No back pain reported after session.      Clinical impression      Continued working up upright posture, thoracic extension, trunk and glute strength and general LE strengthening to decrease low back stress as well as improve balance. Pt tolerated session well without aggravation of symptoms. Pt will benefit from continued skilled physical therapy services to decrease pain, improve strength, balance, and function.        PATIENT EDUCATION: Education details: there-ex, HEP Person educated: Patient Education method: Explanation, Demonstration, Tactile cues, Verbal cues, and Handouts Education comprehension: verbalized understanding and returned demonstration   HOME EXERCISE PROGRAM:       Access Code: [email or web address] URL: [email or web address] Date: 07/05/2022 Prepared by: Joneen Boers    Exercises - Standing Hip Abduction with Counter Support  - 2 x daily - 7 x weekly - 1 sets - 10 reps - 5 seconds hold - Sit to Stand with Armchair  - 1 x daily - 3 x weekly - 2 sets - 8 reps - Scapular Retraction with Resistance  - 1 x daily - 3 x weekly - 3 sets - 12 reps The HEP added last session (07/19/2022) per pt: sit <> stand, stepping over obstacles, walking forward and back    PT Short Term Goals - 08/10/22  1019       PT SHORT TERM GOAL #1   Title Pt will be independent with his initial HEP to decrease pain, improve strength, balance, and function.    Baseline Pt has started his initial HEP (07/05/2022); 08/10/2022 reports independence compelting as prescribed.    Time 3    Period Weeks    Status Achieved    Target Date 08/10/22              PT Long Term Goals - 09/05/22 0942       PT LONG TERM GOAL #1   Title Patient will have a decrease in low back pain to 4/10 or less at worst to promote ability to perform standing tasks, ambulate, and negotiate stairs with less difficulty.    Baseline 10/10 low back pain at worst for the past 3 months (07/05/2022); denies back pain with standing, walking or stair negotiation; 4/10 at worst for the past 7 days (09/05/2022)    Time 8    Period Weeks    Status Achieved    Target Date 08/10/22      PT LONG TERM GOAL #2   Title Patient will improve his lumbar FOTO score by at least 10 points as a demonstration of improved function.    Baseline Lumbar spine FOTO 40 (07/05/2022); 52    Time 8    Period Weeks    Status Achieved    Target Date 08/10/22      PT LONG TERM GOAL #3   Title Pt will improve his TUG score to 12 seconds or less without SPC as a demonstration of improved balance and functional mobility.    Baseline 18.33 seconds average with SPC on R side (07/05/2022); 22.05 sec  with SPC on RUE; 09/05/2022)    Time 8    Period Weeks    Status On-going    Target Date 11/03/22      PT LONG TERM GOAL #4   Title Pt will improve bilateral bilateral hip extension and abduction to promote ability to perform standing tasks more steadily.    Baseline Hip extension 4+/5 R and L,  hip abduction 4+/5 R, 4/5 L (07/05/2022); deferred for primary PT; hip extension 5/5 R and L, hip abduction 5/5 R and L (09/05/2022)    Time 8    Period Weeks    Status Achieved    Target Date 09/01/22      PT LONG TERM GOAL #5   Title Pt will improve 5xSTS without UE  support to 14.8 sec or less to demonstrate clinically significant improvement for age matched norms for LE strength and reduced risk of falls.    Baseline 08/10/22: 27.29 with UE's on thighs standard height chair; B UE assist 14.18 seconds (09/05/2022)    Time 8    Period Weeks    Status On-going    Target Date 11/03/22              Plan - 09/21/22 0933     Clinical Impression Statement Continued working up upright posture, thoracic extension, trunk and glute strength and general LE strengthening to decrease low back stress as well as improve balance. Pt tolerated session well without aggravation of symptoms. Pt will benefit from continued skilled physical therapy services to decrease pain, improve strength, balance, and function.    Personal Factors and Comorbidities Age;Comorbidity 3+;Past/Current Experience;Time since onset of injury/illness/exacerbation    Comorbidities A-fib, arthritis, basal cell CA, chronic kidney disease, dysrhythmia, HTN, MI, phlebitis, L hip hemiarthroplasty    Examination-Activity Limitations Stairs;Stand;Bend;Locomotion Level;Transfers    Stability/Clinical Decision Making Stable/Uncomplicated    Rehab Potential Fair    PT Frequency 2x / week    PT Duration 8 weeks    PT Treatment/Interventions Therapeutic exercise;Therapeutic activities;Stair training;Functional mobility training;Balance training;Neuromuscular re-education;Patient/family education;Manual techniques;Dry needling;Aquatic Therapy;Electrical Stimulation;Iontophoresis '4mg'$ /ml Dexamethasone;Gait training    PT Next Visit Plan posture, thoracic and hip extension, trunk and hip strengthening, manual techniques, modalities PRN    PT Home Exercise Plan Medbridge Access Code: [email or web address]    Consulted and Agree with Plan of Care Patient                         Joneen Boers PT, DPT   09/21/2022, 11:15 AM

## 2022-09-26 ENCOUNTER — Ambulatory Visit: Payer: Medicare Other | Attending: Internal Medicine

## 2022-09-26 DIAGNOSIS — R2681 Unsteadiness on feet: Secondary | ICD-10-CM | POA: Insufficient documentation

## 2022-09-26 DIAGNOSIS — M5459 Other low back pain: Secondary | ICD-10-CM | POA: Diagnosis present

## 2022-09-26 DIAGNOSIS — R262 Difficulty in walking, not elsewhere classified: Secondary | ICD-10-CM | POA: Diagnosis present

## 2022-09-26 NOTE — Therapy (Signed)
OUTPATIENT PHYSICAL THERAPY TREATMENT NOTE   Patient Name: Robert Rivas MRN: [id number] DOB:1941-11-27, 81 y.o., male Today's Date: 09/26/2022  PCP: Idelle Crouch, MD REFERRING PROVIDER: Idelle Crouch, MD   PT End of Session - 09/26/22 1101     Visit Number 18    Number of Visits 10    Date for PT Re-Evaluation 11/03/22    Authorization Type [employer] Medicare Primary; [employer] sceondary    Authorization Time Period 07/05/22-09/01/22    PT Start Time 1101    PT Stop Time 1140    PT Time Calculation (min) 39 min    Equipment Utilized During Treatment Gait belt    Activity Tolerance Patient tolerated treatment well    Behavior During Therapy WFL for tasks assessed/performed                         Past Medical History:  Diagnosis Date   A-fib ([place])    Arthritis    lower back   Basal cell carcinoma 10/29/2020   Right post base of skull/neck   BPH (benign prostatic hyperplasia)    Cancer (HCC)    Skin Cancer   Chronic kidney disease    Chronic Kidney Disease   Coronary artery disease    Dysrhythmia    Atrial Fibrillation; Supraventricular Tachycardia   Gout    Hearing aid worn    has, does not wear   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   L upper back 5.0cm inf to base of neck, 5.0cm lat to spine   Hyperlipidemia    Hypertension    MI, old    Nocturia    Phlebitis    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   SCC IS R foreearm distal   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   SCC IS R forearm proximal   Squamous cell carcinoma of skin 03/26/2018   SCC IS R forearm   Squamous cell carcinoma of skin 01/01/2008   SCC IS R forearm   Wears dentures    partial   Past Surgical History:  Procedure Laterality Date   cardaic stents     CARDIAC CATHETERIZATION     PTCA with Stent Placement   CARDIOVERSION N/A 04/13/2021   Procedure: CARDIOVERSION;  Surgeon: Corey Skains, MD;  Location: [facility] ORS;  Service: Cardiovascular;  Laterality:  N/A;   CARDIOVERSION N/A 05/05/2021   Procedure: CARDIOVERSION;  Surgeon: Corey Skains, MD;  Location: [facility] OR[facility];  Service: Cardiovascular;  Laterality: N/A;   COLONOSCOPY WITH PROPOFOL N/A 12/15/2015   Procedure: COLONOSCOPY WITH PROPOFOL;  Surgeon: Lollie Sails, MD;  Location: [facility] ENDOSCOPY;  Service: Endoscopy;  Laterality: N/A;   CORONARY ARTERY BYPASS GRAFT  1992   ECTROPION REPAIR Right 12/12/2017   Procedure: REPAIR OF ECTROPION SUTURES/EXTENSIVE RIGHT;  Surgeon: Karle Starch, MD;  Location: [place];  Service: Ophthalmology;  Laterality: Right;   HERNIA REPAIR     HIP ARTHROPLASTY Left 02/19/2022   Procedure: ARTHROPLASTY BIPOLAR HIP (HEMIARTHROPLASTY);  Surgeon: Thornton Park, MD;  Location: [facility] ORS;  Service: Orthopedics;  Laterality: Left;   IR KYPHO LUMBAR INC FX REDUCE BONE BX UNI/BIL CANNULATION INC/IMAGING  06/07/2022   IR RADIOLOGIST EVAL & MGMT  06/30/2022   SVT ABLATION  02/22/2017   [facility]   Patient Active Problem List   Diagnosis Date Noted   Acute urinary retention 02/19/2022   Acute delirium 02/18/2022   Dysphagia 02/18/2022  Hyponatremia 02/15/2022   Closed left hip fracture ([place]) 02/14/2022   Atrial fibrillation with RVR ([place]) 04/12/2021   Bradycardia 07/08/2019   Venous insufficiency of both lower extremities 07/08/2019   Abnormal glucose 01/10/2019   Elevated PSA 01/10/2019   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   Palpitations 06/13/2018   Leg edema, right 09/20/2017   History of BPH 02/21/2017   BPH (benign prostatic hyperplasia) 04/25/2016   Nocturia 04/25/2016   Gout 03/25/2016   History of colon polyps 03/25/2016   Inflammation of a vein 03/25/2016   Percutaneous transluminal coronary angioplasty status 03/25/2016   Supraventricular tachycardia 03/25/2016   Benign essential HTN 06/10/2015   Paroxysmal atrial fibrillation ([place]) 01/20/2015   Atrial fibrillation ([place]) 05/13/2014   Coronary artery disease 05/13/2014   Chest pain 05/13/2014    Stage 3a chronic kidney disease (CKD) ([place]) 05/13/2014   Essential (primary) hypertension 05/13/2014   History of cardiac catheterization 05/13/2014   HLD (hyperlipidemia) 05/13/2014   Combined fat and carbohydrate induced hyperlipemia 05/13/2014   History of cardiovascular surgery 05/13/2014   History of anticoagulant therapy 04/28/2014   Warfarin anticoagulation 04/28/2014   Benign prostatic hyperplasia with urinary obstruction 11/05/2013    REFERRING DIAG: LBP  THERAPY DIAG:  Unsteadiness on feet  Other low back pain  Difficulty in walking, not elsewhere classified  Rationale for Evaluation and Treatment Rehabilitation  PERTINENT HISTORY: LBP, balance. Pt fell 02/17/2022 while trying to step up onto his half step to get into his home in the garage. Pt tripped on the rug which is no longer there. Pt fractured L hip S/P L hip hemiarthroplasty on 02/19/2022. Pt underwent home health PT for the hip rehab. Pt fell again a couple of weeks after participating in rehab resulting in lumbar fracture. Does not remember what caused the fall. Pt was doing something in the hall. Pt does not remember if he was using an AD. Pt had a shot in his back which took away the pain. Pt started using his SPC a couple of weeks ago. Home health PT ended last week. Independent ambulation prior to L hip fracture.  PRECAUTIONS: fall risk,   SUBJECTIVE: Going to see a back specialist tomorrow morning.    PAIN:  Are you having pain? 0/10      TODAY'S TREATMENT:   Therapeutic exerise   Gait with SPC on R side, cues for increasing R LE step length 200 ft  Sit <> stand with B UE assist 10x with emphasis on forward weight shifting, minimal use of hands.    Standing gastroc stretch to promote  ROM for ankle strategy  R 30 seconds x 3  L 30 seconds x3  Standing B shoulder extension, yellow band 10x5 seconds for 3 sets to promote trunk strength and decrease stress to low back  Standing with B UE assist    Hip abduction with yellow band around ankles   R 10x3   L 10x3  Standing up from a chair, walking 10 ft forward, then returning 10 ft, then sitting back onto chair 3x With SPC: 15.28 seconds, 13.86 seconds, 13.45 seconds  (14.2 seconds average).    SLS with contralateral UE assist   R 10x5 seconds for 2 sets  L 10x5 seconds for 2 sets    Gait from gym to car with [facility], cues for increasing step length.        Improved exercise technique, movement at target joints, use of target muscles after mod verbal, visual, tactile cues.  Response to treatment Pt tolerated session well without aggravation of symptoms.      Clinical impression   Improved TUG time with [facility] since initial evaluation suggesting improved functional mobility and balance. Continued working on up upright posture, thoracic extension, trunk and glute strength and general LE strengthening to decrease low back stress as well as improve balance. Pt tolerated session well without aggravation of symptoms. Pt will benefit from continued skilled physical therapy services to decrease pain, improve strength, balance, and function.        PATIENT EDUCATION: Education details: there-ex, HEP Person educated: Patient Education method: Explanation, Demonstration, Tactile cues, Verbal cues, and Handouts Education comprehension: verbalized understanding and returned demonstration   HOME EXERCISE PROGRAM:       Access Code: [email or web address] URL: [email or web address] Date: 07/05/2022 Prepared by: Joneen Boers    Exercises - Standing Hip Abduction with Counter Support  - 2 x daily - 7 x weekly - 1 sets - 10 reps - 5 seconds hold - Sit to Stand with Armchair  - 1 x daily - 3 x weekly - 2 sets - 8 reps - Scapular Retraction with Resistance  - 1 x daily - 3 x weekly - 3 sets - 12 reps The HEP added last session (07/19/2022) per pt: sit <> stand, stepping over obstacles, walking forward and back    PT  Short Term Goals - 08/10/22 1019       PT SHORT TERM GOAL #1   Title Pt will be independent with his initial HEP to decrease pain, improve strength, balance, and function.    Baseline Pt has started his initial HEP (07/05/2022); 08/10/2022 reports independence compelting as prescribed.    Time 3    Period Weeks    Status Achieved    Target Date 08/10/22              PT Long Term Goals - 09/05/22 0942       PT LONG TERM GOAL #1   Title Patient will have a decrease in low back pain to 4/10 or less at worst to promote ability to perform standing tasks, ambulate, and negotiate stairs with less difficulty.    Baseline 10/10 low back pain at worst for the past 3 months (07/05/2022); denies back pain with standing, walking or stair negotiation; 4/10 at worst for the past 7 days (09/05/2022)    Time 8    Period Weeks    Status Achieved    Target Date 08/10/22      PT LONG TERM GOAL #2   Title Patient will improve his lumbar FOTO score by at least 10 points as a demonstration of improved function.    Baseline Lumbar spine FOTO 40 (07/05/2022); 52    Time 8    Period Weeks    Status Achieved    Target Date 08/10/22      PT LONG TERM GOAL #3   Title Pt will improve his TUG score to 12 seconds or less without SPC as a demonstration of improved balance and functional mobility.    Baseline 18.33 seconds average with SPC on R side (07/05/2022); 22.05 sec  with SPC on RUE; 09/05/2022)    Time 8    Period Weeks    Status On-going    Target Date 11/03/22      PT LONG TERM GOAL #4   Title Pt will improve bilateral bilateral hip extension and abduction to promote ability to perform standing tasks more steadily.    Baseline  Hip extension 4+/5 R and L, hip abduction 4+/5 R, 4/5 L (07/05/2022); deferred for primary PT; hip extension 5/5 R and L, hip abduction 5/5 R and L (09/05/2022)    Time 8    Period Weeks    Status Achieved    Target Date 09/01/22      PT LONG TERM GOAL #5   Title Pt will  improve 5xSTS without UE support to 14.8 sec or less to demonstrate clinically significant improvement for age matched norms for LE strength and reduced risk of falls.    Baseline 08/10/22: 27.29 with UE's on thighs standard height chair; B UE assist 14.18 seconds (09/05/2022)    Time 8    Period Weeks    Status On-going    Target Date 11/03/22              Plan - 09/26/22 1100     Clinical Impression Statement Improved TUG time with SPC since initial evaluation suggesting improved functional mobility and balance. Continued working on up upright posture, thoracic extension, trunk and glute strength and general LE strengthening to decrease low back stress as well as improve balance. Pt tolerated session well without aggravation of symptoms. Pt will benefit from continued skilled physical therapy services to decrease pain, improve strength, balance, and function.    Personal Factors and Comorbidities Age;Comorbidity 3+;Past/Current Experience;Time since onset of injury/illness/exacerbation    Comorbidities A-fib, arthritis, basal cell CA, chronic kidney disease, dysrhythmia, HTN, MI, phlebitis, L hip hemiarthroplasty    Examination-Activity Limitations Stairs;Stand;Bend;Locomotion Level;Transfers    Stability/Clinical Decision Making Stable/Uncomplicated    Clinical Decision Making Low    Rehab Potential Fair    PT Frequency 2x / week    PT Duration 8 weeks    PT Treatment/Interventions Therapeutic exercise;Therapeutic activities;Stair training;Functional mobility training;Balance training;Neuromuscular re-education;Patient/family education;Manual techniques;Dry needling;Aquatic Therapy;Electrical Stimulation;Iontophoresis '4mg'$ /ml Dexamethasone;Gait training    PT Next Visit Plan posture, thoracic and hip extension, trunk and hip strengthening, manual techniques, modalities PRN    PT Home Exercise Plan Medbridge Access Code: [email or web address]    Consulted and Agree with Plan of Care Patient                          Joneen Boers PT, DPT   09/26/2022, 11:51 AM

## 2022-09-26 NOTE — Progress Notes (Signed)
Referring Physician:  Idelle Crouch, MD [facility] [place],  [place] [address]  Primary Physician:  Robert Crouch, MD  History of Present Illness: 09/27/2022 Had hip fracture back in February and had surgery.   History of L2 compression fracture with kyphoplasty done on 06/07/22 with good improvement in his pain. Sees PMR (Robert Rivas)- had relief with first facet injections, but not the last two injections.   He has no current back or leg pain. No numbness, tingling, or weakness. He is walking with a cane. He is in PT. Feels like his walking is improving. He's had some balance issues since his hip surgery, but feels this is improving also.   He is on ELIQUIS. History of afib, CKD, CAD s/p CABG, gout, HTN, MI.   Conservative measures:  Physical therapy: 18 visits from  07/05/22 to 09/26/22 Multimodal medical therapy including regular antiinflammatories: oxyIR, ultram  Injections: Bilateral L4-5 and L5-S1 zygapophysial joint injections were performed 03/30/22 (Robert Rivas) 03/11/2021: Bilateral L4-5 and L5-S1 facet joint injections (95% relief)  01/28/2020: Bilateral L4-5 and L5-S1 facet joint injections (good relief)   Past Surgery: kyphoplasty L2 on 06/07/22  Robert Rivas has no symptoms of cervical myelopathy. He's had some balance issues since hip surgery that are improving with PT.    Review of Systems:  A 10 point review of systems is negative, except for the pertinent positives and negatives detailed in the HPI.  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   L upper back 5.0cm inf to base of neck, 5.0cm lat to spine   Hyperlipidemia    Hypertension    MI, old    Nocturia    Phlebitis    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   SCC IS R foreearm distal   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   SCC IS R forearm proximal   Squamous cell carcinoma of skin 03/26/2018   SCC IS R forearm   Squamous cell carcinoma of skin 01/01/2008   SCC IS R forearm   Wears dentures    partial    Past Surgical History: Past Surgical History:  Procedure Laterality Date   cardaic stents     CARDIAC CATHETERIZATION     PTCA with Stent Placement   CARDIOVERSION N/A 04/13/2021   Procedure: CARDIOVERSION;  Surgeon: Robert Skains, MD;  Location: [facility] ORS;  Service: Cardiovascular;  Laterality: N/A;   CARDIOVERSION N/A 05/05/2021   Procedure: CARDIOVERSION;  Surgeon: Robert Skains, MD;  Location: [facility] ORS;  Service: Cardiovascular;  Laterality: N/A;   COLONOSCOPY WITH PROPOFOL N/A 12/15/2015   Procedure: COLONOSCOPY WITH PROPOFOL;  Surgeon: Robert Sails, MD;  Location: [facility] ENDOSCOPY;  Service: Endoscopy;  Laterality: N/A;   CORONARY ARTERY BYPASS GRAFT  1992   ECTROPION REPAIR Right 12/12/2017   Procedure: REPAIR OF ECTROPION SUTURES/EXTENSIVE RIGHT;  Surgeon: Robert Starch, MD;  Location: [place];  Service: Ophthalmology;  Laterality: Right;   HERNIA REPAIR     HIP ARTHROPLASTY Left 02/19/2022   Procedure: ARTHROPLASTY BIPOLAR HIP (HEMIARTHROPLASTY);  Surgeon: Robert Park, MD;  Location: [facility] ORS;  Service: Orthopedics;  Laterality: Left;   IR KYPHO LUMBAR INC FX REDUCE BONE BX UNI/BIL CANNULATION INC/IMAGING  06/07/2022   IR RADIOLOGIST EVAL & MGMT  06/30/2022   SVT ABLATION  02/22/2017   [facility]    Allergies: Allergies as of 09/27/2022 - Review Complete 08/10/2022  Allergen Reaction Noted   Codeine Nausea And Vomiting and Nausea Only 07/13/2015   Tizanidine Other (See Comments)  06/07/2017    Medications: Outpatient Encounter Medications as of 09/27/2022  Medication Sig   amiodarone (PACERONE) 200 MG tablet Take 200 mg by mouth daily. (Patient not taking: Reported on 05/26/2022)   apixaban (ELIQUIS) 2.5 MG TABS tablet Eliquis 2.5 mg tablet   docusate sodium (COLACE) 100 MG capsule Take 100 mg by mouth in the morning.   ezetimibe (ZETIA) 10 MG tablet Take 10 mg by mouth at bedtime.    hydrALAZINE (APRESOLINE) 50 MG tablet Take 50 mg by mouth 2 (two) times daily.   levothyroxine (SYNTHROID) 50 MCG tablet Take 1 tablet (50 mcg total) by mouth daily at 6 (six) AM.   metoprolol tartrate (LOPRESSOR) 25 MG tablet Take 25 mg by mouth 2 (two) times daily. (Patient not taking: Reported on 05/26/2022)   montelukast (SINGULAIR) 10 MG tablet Take 10 mg by mouth at bedtime.   nitroGLYCERIN (NITROSTAT) 0.4 MG SL tablet Place 0.4 mg under the tongue every 5 (five) minutes x 3 doses as needed for chest pain.   olmesartan (BENICAR) 40 MG tablet Take 40 mg by mouth in the morning and at bedtime.   oxyCODONE (OXY IR/ROXICODONE) 5 MG immediate release tablet Take 1-2 tablets (5-10 mg total) by mouth every 4 (four) hours as needed for moderate pain (pain score 4-6). (Patient not taking: Reported on 05/26/2022)   QUEtiapine (SEROQUEL) 25 MG tablet Take 1 tablet (25 mg total) by mouth at bedtime for 7 days.   simvastatin (ZOCOR) 20 MG tablet Take 20 mg by mouth at bedtime.   sodium chloride 1 g tablet Take 1 tablet (1 g total) by mouth 2 (two) times daily with a meal.   solifenacin (VESICARE) 10 MG tablet Take 10 mg by mouth daily.   SYRINGE-NEEDLE, DISP, 3 ML 25G X 1" 3 ML MISC Use 1 Syringe monthly.   tamsulosin (FLOMAX) 0.4 MG CAPS capsule Take 0.4 mg by mouth at bedtime.   traMADol (ULTRAM) 50 MG tablet Take 50 mg by mouth 3 (three) times daily as needed.   [DISCONTINUED] warfarin (COUMADIN) 2 MG tablet Take 4 mg by mouth at bedtime. (Patient not taking: Reported on 05/26/2022)   No  facility-administered encounter medications on file as of 09/27/2022.    Social History: Social History   Tobacco Use   Smoking status: Never   Smokeless tobacco: Never  Vaping Use   Vaping Use: Never used  Substance Use Topics   Alcohol use: No   Drug use: No    Family Medical History: Family History  Problem Relation Age of Onset   Kidney cancer Maternal Uncle    Prostate cancer Neg Hx    Bladder Cancer Neg Hx     Physical Examination: Vitals:   09/27/22 1307  BP: (!) 160/80  Pulse: 65    General: Patient is well developed, well nourished, calm, collected, and in no apparent distress. Attention to  examination is appropriate.  Respiratory: Patient is breathing without any difficulty.   NEUROLOGICAL:     Awake, alert, oriented to person, place, and time.  Speech is clear and fluent. Fund of knowledge is appropriate.   Cranial Nerves: Pupils equal round and reactive to light.  Facial tone is symmetric.  Facial sensation is symmetric.  No abnormal lesions on exposed skin.   No lower lumbar tenderness.   Strength: Side Biceps Triceps Deltoid Interossei Grip Wrist Ext. Wrist Flex.  R '5 5 5 5 5 5 5  '$ L '5 5 5 5 5 5 5   '$ Side Iliopsoas Quads Hamstring PF DF EHL  R '5 5 5 5 5 5  '$ L '5 5 5 5 5 5   '$ Reflexes are 2+ and symmetric at the biceps, triceps, brachioradialis, patella and achilles.    Hoffman's is unable to be tested- has flexion deformity of both middle fingers and right small finger.  Clonus is not present.   Bilateral upper and lower extremity sensation is intact to light touch.     Gait is slow and he ambulates with a cane.   Medical Decision Making  Imaging: MRI of lumbar spine 05/07/22:  FINDINGS: Segmentation: In correlation with the numbering system utilized on the previous report, the lowest well developed disc space is designated as L5-S1 with a partially formed vestigial disc at S1-S2.   Alignment:  Physiologic.   Vertebrae: Acute superior  endplate compression fracture of the L2 vertebral body with 55% vertebral body height loss. Slight retropulsion of the posterior vertebral body wall. Fracture line involves the bilateral pedicles. Marked bone marrow edema throughout the vertebral body.   Mild marrow edema along the inferior endplate of L1, which may be discogenic or reflect bone contusion. No additional fracture. No evidence of discitis. No suspicious bone lesion.   Conus medullaris and cauda equina: Conus extends to the L2-3 level. Conus and cauda equina appear normal. No evidence of distal cord contusion at the L2 fracture site. No epidural hematoma.   Paraspinal and other soft tissues: Trabeculated appearance of the visualized urinary bladder wall.   Disc levels:   T12-L1: Mild disc bulge with mild bilateral facet arthropathy and ligamentum flavum buckling. No foraminal or canal stenosis.   L1-L2: Mild disc bulge and minimal retropulsion of the L2 superior endplate. Mild bilateral facet hypertrophy with ligamentum flavum buckling. Findings result in mild canal stenosis without foraminal stenosis.   L2-L3: Mild disc bulge with mild bilateral facet arthropathy and ligamentum flavum buckling. Mild canal stenosis. No significant foraminal stenosis. Unchanged.   L3-L4: Mild diffuse disc bulge with mild bilateral facet arthropathy and ligamentum flavum buckling. Findings result in mild canal stenosis with mild bilateral foraminal stenosis. Unchanged.   L4-L5: Mild annular disc bulge with mild bilateral facet arthropathy and marked ligamentum flavum thickening. Findings result in severe canal stenosis with mild bilateral foraminal stenosis. Findings progressed from prior.   L5-S1: Minimal disc bulge with endplate ridging. Mild bilateral facet arthropathy with ligamentum flavum buckling. Findings result in moderate canal stenosis. Borderline-mild bilateral foraminal stenosis. Unchanged.   S1-S2: No  impingement.   IMPRESSION: 1. Acute superior endplate compression fracture of the L2 vertebral body with 55% vertebral body height loss and slight retropulsion of the posterior vertebral body wall. Mild canal stenosis at this level. 2. Multilevel degenerative changes of the lumbar spine, as described above, progressed at the L4-5 level where there is severe canal stenosis and mild bilateral foraminal stenosis.   These results  will be called to the ordering clinician or representative by the Radiologist Assistant, and communication documented in the PACS or [employer].     Electronically Signed   By: Davina Poke D.O.   On: 05/07/2022 09:30 I have personally reviewed the images and agree with the above interpretation.  Assessment and Plan: Robert Rivas is a pleasant 81 y.o. male with no current LBP or leg pain. Doing well since kyphoplasty at L2.   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   Treatment options discussed with patient and following plan made:   - As he has no pain, no further treatment recommended for his lumbar spine.  - Reviewed his MRI at length with him and answered all questions regarding spinal stenosis.  - Recommend he continue with current PT.  - If he develops back/leg pain, recommend he follow up with Dr. Sharlet Salina to discuss possible injections.  - If symptoms become severe or his walking was limited significantly, then he may be a candidate for surgery.  - He will f/u prn at his request.    I spent a total of 20 minutes in face-to-face and non-face-to-face activities related to this patient's care today.  Thank you for involving me in the care of this patient.   Robert Boot PA-C Dept. of Neurosurgery

## 2022-09-27 ENCOUNTER — Ambulatory Visit (INDEPENDENT_AMBULATORY_CARE_PROVIDER_SITE_OTHER): Payer: Medicare Other | Admitting: Orthopedic Surgery

## 2022-09-27 ENCOUNTER — Encounter: Payer: Self-pay | Admitting: Orthopedic Surgery

## 2022-09-27 VITALS — BP 160/80 | HR 65 | Ht 73.0 in | Wt 199.4 lb

## 2022-09-27 DIAGNOSIS — M47816 Spondylosis without myelopathy or radiculopathy, lumbar region: Secondary | ICD-10-CM | POA: Diagnosis not present

## 2022-09-27 DIAGNOSIS — M48061 Spinal stenosis, lumbar region without neurogenic claudication: Secondary | ICD-10-CM | POA: Diagnosis not present

## 2022-09-27 NOTE — Patient Instructions (Signed)
It was so nice to see you today, I am so glad you are doing well!  The MRI of your lower back shows wear and tear (arthritis) in the joints at L4-L5 and L5-S1 (last two levels). You also have some spinal stenosis (pressure on spinal cord). These things can cause lower back pain and/or leg pain.   You do not have any pain in your back and legs, so I don't recommend any treatment for your back. If you develop pain, I would follow up with Dr. Sharlet Salina to see if he can do injections. He may be able to do another type of injection then the ones you had done.   I agree with continuing with PT as well.   Surgery can be an option for your back, but I would only recommend if you were miserable and nothing else helped.    Please do not hesitate to call if you have any questions or concerns. You can also message me in [place].   It was so nice to talk to both of you today!  Geronimo Boot PA-C [phone]

## 2022-09-29 ENCOUNTER — Ambulatory Visit: Payer: Medicare Other

## 2022-09-29 DIAGNOSIS — R2681 Unsteadiness on feet: Secondary | ICD-10-CM

## 2022-09-29 DIAGNOSIS — M5459 Other low back pain: Secondary | ICD-10-CM

## 2022-09-29 DIAGNOSIS — R262 Difficulty in walking, not elsewhere classified: Secondary | ICD-10-CM

## 2022-09-29 NOTE — Therapy (Signed)
OUTPATIENT PHYSICAL THERAPY TREATMENT NOTE   Patient Name: Robert Rivas MRN: [id number] DOB:1941-01-02, 81 y.o., male Today's Date: 09/29/2022  PCP: Idelle Crouch, MD REFERRING PROVIDER: Idelle Crouch, MD   PT End of Session - 09/29/22 0934     Visit Number 19    Number of Visits 37    Date for PT Re-Evaluation 11/03/22    Authorization Type UHC Medicare Primary; Tricare for Life sceondary    Authorization Time Period 07/05/22-09/01/22    PT Start Time 0934    PT Stop Time 1013    PT Time Calculation (min) 39 min    Equipment Utilized During Treatment Gait belt    Activity Tolerance Patient tolerated treatment well    Behavior During Therapy WFL for tasks assessed/performed                          Past Medical History:  Diagnosis Date   A-fib ([place])    Arthritis    lower back   Basal cell carcinoma 10/29/2020   Right post base of skull/neck   BPH (benign prostatic hyperplasia)    Cancer (HCC)    Skin Cancer   Chronic kidney disease    Chronic Kidney Disease   Coronary artery disease    Dysrhythmia    Atrial Fibrillation; Supraventricular Tachycardia   Gout    Hearing aid worn    has, does not wear   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   L upper back 5.0cm inf to base of neck, 5.0cm lat to spine   Hyperlipidemia    Hypertension    MI, old    Nocturia    Phlebitis    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   SCC IS R foreearm distal   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   SCC IS R forearm proximal   Squamous cell carcinoma of skin 03/26/2018   SCC IS R forearm   Squamous cell carcinoma of skin 01/01/2008   SCC IS R forearm   Wears dentures    partial   Past Surgical History:  Procedure Laterality Date   cardaic stents     CARDIAC CATHETERIZATION     PTCA with Stent Placement   CARDIOVERSION N/A 04/13/2021   Procedure: CARDIOVERSION;  Surgeon: Corey Skains, MD;  Location: [facility] ORS;  Service: Cardiovascular;   Laterality: N/A;   CARDIOVERSION N/A 05/05/2021   Procedure: CARDIOVERSION;  Surgeon: Corey Skains, MD;  Location: [facility];  Service: Cardiovascular;  Laterality: N/A;   COLONOSCOPY WITH PROPOFOL N/A 12/15/2015   Procedure: COLONOSCOPY WITH PROPOFOL;  Surgeon: Lollie Sails, MD;  Location: [facility] ENDOSCOPY;  Service: Endoscopy;  Laterality: N/A;   CORONARY ARTERY BYPASS GRAFT  1992   ECTROPION REPAIR Right 12/12/2017   Procedure: REPAIR OF ECTROPION SUTURES/EXTENSIVE RIGHT;  Surgeon: Karle Starch, MD;  Location: [place];  Service: Ophthalmology;  Laterality: Right;   HERNIA REPAIR     HIP ARTHROPLASTY Left 02/19/2022   Procedure: ARTHROPLASTY BIPOLAR HIP (HEMIARTHROPLASTY);  Surgeon: Thornton Park, MD;  Location: [facility];  Service: Orthopedics;  Laterality: Left;   IR KYPHO LUMBAR INC FX REDUCE BONE BX UNI/BIL CANNULATION INC/IMAGING  06/07/2022   IR RADIOLOGIST EVAL & MGMT  06/30/2022   SVT ABLATION  02/22/2017   [facility]   Patient Active Problem List   Diagnosis Date Noted   Acute urinary retention 02/19/2022   Acute delirium 02/18/2022   Dysphagia 02/18/2022  Hyponatremia 02/15/2022   Closed left hip fracture ([place]) 02/14/2022   Atrial fibrillation with RVR ([place]) 04/12/2021   Bradycardia 07/08/2019   Venous insufficiency of both lower extremities 07/08/2019   Abnormal glucose 01/10/2019   Elevated PSA 01/10/2019   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   Palpitations 06/13/2018   Leg edema, right 09/20/2017   History of BPH 02/21/2017   BPH (benign prostatic hyperplasia) 04/25/2016   Nocturia 04/25/2016   Gout 03/25/2016   History of colon polyps 03/25/2016   Inflammation of a vein 03/25/2016   Percutaneous transluminal coronary angioplasty status 03/25/2016   Supraventricular tachycardia 03/25/2016   Benign essential HTN 06/10/2015   Paroxysmal atrial fibrillation ([place]) 01/20/2015   Atrial fibrillation ([place]) 05/13/2014   Coronary artery disease 05/13/2014   Chest pain  05/13/2014   Stage 3a chronic kidney disease (CKD) ([place]) 05/13/2014   Essential (primary) hypertension 05/13/2014   History of cardiac catheterization 05/13/2014   HLD (hyperlipidemia) 05/13/2014   Combined fat and carbohydrate induced hyperlipemia 05/13/2014   History of cardiovascular surgery 05/13/2014   History of anticoagulant therapy 04/28/2014   Warfarin anticoagulation 04/28/2014   Benign prostatic hyperplasia with urinary obstruction 11/05/2013    REFERRING DIAG: LBP  THERAPY DIAG:  Unsteadiness on feet  Other low back pain  Difficulty in walking, not elsewhere classified  Rationale for Evaluation and Treatment Rehabilitation  PERTINENT HISTORY: LBP, balance. Pt fell 02/17/2022 while trying to step up onto his half step to get into his home in the garage. Pt tripped on the rug which is no longer there. Pt fractured L hip S/P L hip hemiarthroplasty on 02/19/2022. Pt underwent home health PT for the hip rehab. Pt fell again a couple of weeks after participating in rehab resulting in lumbar fracture. Does not remember what caused the fall. Pt was doing something in the hall. Pt does not remember if he was using an AD. Pt had a shot in his back which took away the pain. Pt started using his SPC a couple of weeks ago. Home health PT ended last week. Independent ambulation prior to L hip fracture.  PRECAUTIONS: fall risk,   SUBJECTIVE: Back is doing ok. 3-4/10 at worst for the past 7 days. Back specialist said that he does not need surgery.    PAIN:  Are you having pain? 0/10      TODAY'S TREATMENT:   Therapeutic exerise   Gait with SPC on R side, cues for increasing R LE step length 200 ft  Sit <> stand with B UE assist 10x with emphasis on forward weight shifting, minimal use of hands.   Able to stand up 3x with hands forward and not pushing on the chair.   Stepping over 2 mini hurdles with one UE assist 10x2  Side stepping 5 ft to the L and 5 ft to the R, no UE  assist 5x2  Four square step exercise, SPC assist PRN for multidirectional stepping balance  5x2  Standing static forward mini lunge with one UE assist PRN  R 10x2  L 10x2    Gait from gym to car with SPC, cues for increasing step length.        Improved exercise technique, movement at target joints, use of target muscles after mod verbal, visual, tactile cues.         Response to treatment Pt tolerated session well without aggravation of symptoms.      Clinical impression  Continued working on gait, LE strengthening as well as obstacle negotiation  to promote balance and decrease fall risk. Improving ability to forward weight shift and place his center of gravity over his base of support (foot) observed. Able to stand up 3 times without pushing off with his UE on the chair with sit to stand exercise today. Pt tolerated session well without aggravation of symptoms. Pt will benefit from continued skilled physical therapy services to decrease pain, improve strength, balance, and function.        PATIENT EDUCATION: Education details: there-ex, HEP Person educated: Patient Education method: Explanation, Demonstration, Tactile cues, Verbal cues, and Handouts Education comprehension: verbalized understanding and returned demonstration   HOME EXERCISE PROGRAM:       Access Code: [email or web address] URL: [email or web address] Date: 07/05/2022 Prepared by: Joneen Boers    Exercises - Standing Hip Abduction with Counter Support  - 2 x daily - 7 x weekly - 1 sets - 10 reps - 5 seconds hold - Sit to Stand with Armchair  - 1 x daily - 3 x weekly - 2 sets - 8 reps - Scapular Retraction with Resistance  - 1 x daily - 3 x weekly - 3 sets - 12 reps The HEP added last session (07/19/2022) per pt: sit <> stand, stepping over obstacles, walking forward and back    PT Short Term Goals - 08/10/22 1019       PT SHORT TERM GOAL #1   Title Pt will be independent with his  initial HEP to decrease pain, improve strength, balance, and function.    Baseline Pt has started his initial HEP (07/05/2022); 08/10/2022 reports independence compelting as prescribed.    Time 3    Period Weeks    Status Achieved    Target Date 08/10/22              PT Long Term Goals - 09/05/22 0942       PT LONG TERM GOAL #1   Title Patient will have a decrease in low back pain to 4/10 or less at worst to promote ability to perform standing tasks, ambulate, and negotiate stairs with less difficulty.    Baseline 10/10 low back pain at worst for the past 3 months (07/05/2022); denies back pain with standing, walking or stair negotiation; 4/10 at worst for the past 7 days (09/05/2022)    Time 8    Period Weeks    Status Achieved    Target Date 08/10/22      PT LONG TERM GOAL #2   Title Patient will improve his lumbar FOTO score by at least 10 points as a demonstration of improved function.    Baseline Lumbar spine FOTO 40 (07/05/2022); 52    Time 8    Period Weeks    Status Achieved    Target Date 08/10/22      PT LONG TERM GOAL #3   Title Pt will improve his TUG score to 12 seconds or less without SPC as a demonstration of improved balance and functional mobility.    Baseline 18.33 seconds average with SPC on R side (07/05/2022); 22.05 sec  with SPC on RUE; 09/05/2022)    Time 8    Period Weeks    Status On-going    Target Date 11/03/22      PT LONG TERM GOAL #4   Title Pt will improve bilateral bilateral hip extension and abduction to promote ability to perform standing tasks more steadily.    Baseline Hip extension 4+/5 R and L, hip abduction 4+/5 R, 4/5 L (07/05/2022);  deferred for primary PT; hip extension 5/5 R and L, hip abduction 5/5 R and L (09/05/2022)    Time 8    Period Weeks    Status Achieved    Target Date 09/01/22      PT LONG TERM GOAL #5   Title Pt will improve 5xSTS without UE support to 14.8 sec or less to demonstrate clinically significant improvement for  age matched norms for LE strength and reduced risk of falls.    Baseline 08/10/22: 27.29 with UE's on thighs standard height chair; B UE assist 14.18 seconds (09/05/2022)    Time 8    Period Weeks    Status On-going    Target Date 11/03/22              Plan - 09/29/22 0947     Clinical Impression Statement Continued working on gait, LE strengthening as well as obstacle negotiation to promote balance and decrease fall risk. Improving ability to forward weight shift and place his center of gravity over his base of support (foot) observed. Able to stand up 3 times without pushing off with his UE on the chair with sit to stand exercise today. Pt tolerated session well without aggravation of symptoms. Pt will benefit from continued skilled physical therapy services to decrease pain, improve strength, balance, and function.    Personal Factors and Comorbidities Age;Comorbidity 3+;Past/Current Experience;Time since onset of injury/illness/exacerbation    Comorbidities A-fib, arthritis, basal cell CA, chronic kidney disease, dysrhythmia, HTN, MI, phlebitis, L hip hemiarthroplasty    Examination-Activity Limitations Stairs;Stand;Bend;Locomotion Level;Transfers    Stability/Clinical Decision Making Stable/Uncomplicated    Clinical Decision Making Low    Rehab Potential Fair    PT Frequency 2x / week    PT Duration 8 weeks    PT Treatment/Interventions Therapeutic exercise;Therapeutic activities;Stair training;Functional mobility training;Balance training;Neuromuscular re-education;Patient/family education;Manual techniques;Dry needling;Aquatic Therapy;Electrical Stimulation;Iontophoresis '4mg'$ /ml Dexamethasone;Gait training    PT Next Visit Plan posture, thoracic and hip extension, trunk and hip strengthening, manual techniques, modalities PRN    PT Home Exercise Plan Medbridge Access Code: [email or web address]    Consulted and Agree with Plan of Care Patient                          Joneen Boers PT, DPT   09/29/2022, 11:23 AM

## 2022-10-04 ENCOUNTER — Ambulatory Visit: Payer: Medicare Other

## 2022-10-04 DIAGNOSIS — M5459 Other low back pain: Secondary | ICD-10-CM

## 2022-10-04 DIAGNOSIS — R2681 Unsteadiness on feet: Secondary | ICD-10-CM | POA: Diagnosis not present

## 2022-10-04 DIAGNOSIS — R262 Difficulty in walking, not elsewhere classified: Secondary | ICD-10-CM

## 2022-10-04 NOTE — Therapy (Signed)
OUTPATIENT PHYSICAL THERAPY TREATMENT NOTE And Progress Report (08/10/2022 - 10/04/2022)   Patient Name: Robert Rivas MRN: [id number] DOB:07-19-1941, 81 y.o., male Today's Date: 10/04/2022  PCP: Idelle Crouch, MD REFERRING PROVIDER: Idelle Crouch, MD   PT End of Session - 10/04/22 0935     Visit Number 10    Number of Visits 84    Date for PT Re-Evaluation 11/03/22    Authorization Type UHC Medicare Primary; Tricare for Life sceondary    Authorization Time Period 07/05/22-09/01/22    PT Start Time 0935    PT Stop Time 1014    PT Time Calculation (min) 39 min    Equipment Utilized During Treatment Gait belt    Activity Tolerance Patient tolerated treatment well    Behavior During Therapy WFL for tasks assessed/performed                           Past Medical History:  Diagnosis Date   A-fib ([place])    Arthritis    lower back   Basal cell carcinoma 10/29/2020   Right post base of skull/neck   BPH (benign prostatic hyperplasia)    Cancer (HCC)    Skin Cancer   Chronic kidney disease    Chronic Kidney Disease   Coronary artery disease    Dysrhythmia    Atrial Fibrillation; Supraventricular Tachycardia   Gout    Hearing aid worn    has, does not wear   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   L upper back 5.0cm inf to base of neck, 5.0cm lat to spine   Hyperlipidemia    Hypertension    MI, old    Nocturia    Phlebitis    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   SCC IS R foreearm distal   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   SCC IS R forearm proximal   Squamous cell carcinoma of skin 03/26/2018   SCC IS R forearm   Squamous cell carcinoma of skin 01/01/2008   SCC IS R forearm   Wears dentures    partial   Past Surgical History:  Procedure Laterality Date   cardaic stents     CARDIAC CATHETERIZATION     PTCA with Stent Placement   CARDIOVERSION N/A 04/13/2021   Procedure: CARDIOVERSION;  Surgeon: Corey Skains, MD;   Location: [facility] ORS;  Service: Cardiovascular;  Laterality: N/A;   CARDIOVERSION N/A 05/05/2021   Procedure: CARDIOVERSION;  Surgeon: Corey Skains, MD;  Location: [facility];  Service: Cardiovascular;  Laterality: N/A;   COLONOSCOPY WITH PROPOFOL N/A 12/15/2015   Procedure: COLONOSCOPY WITH PROPOFOL;  Surgeon: Lollie Sails, MD;  Location: [facility] ENDOSCOPY;  Service: Endoscopy;  Laterality: N/A;   CORONARY ARTERY BYPASS GRAFT  1992   ECTROPION REPAIR Right 12/12/2017   Procedure: REPAIR OF ECTROPION SUTURES/EXTENSIVE RIGHT;  Surgeon: Karle Starch, MD;  Location: [place];  Service: Ophthalmology;  Laterality: Right;   HERNIA REPAIR     HIP ARTHROPLASTY Left 02/19/2022   Procedure: ARTHROPLASTY BIPOLAR HIP (HEMIARTHROPLASTY);  Surgeon: Thornton Park, MD;  Location: [facility] ORS;  Service: Orthopedics;  Laterality: Left;   IR KYPHO LUMBAR INC FX REDUCE BONE BX UNI/BIL CANNULATION INC/IMAGING  06/07/2022   IR RADIOLOGIST EVAL & MGMT  06/30/2022   SVT ABLATION  02/22/2017   [facility]   Patient Active Problem List   Diagnosis Date Noted   Acute urinary retention 02/19/2022   Acute  delirium 02/18/2022   Dysphagia 02/18/2022   Hyponatremia 02/15/2022   Closed left hip fracture ([place]) 02/14/2022   Atrial fibrillation with RVR ([place]) 04/12/2021   Bradycardia 07/08/2019   Venous insufficiency of both lower extremities 07/08/2019   Abnormal glucose 01/10/2019   Elevated PSA 01/10/2019   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) 01/20/2015   Atrial fibrillation ([place]) 05/13/2014    Coronary artery disease 05/13/2014   Chest pain 05/13/2014   Stage 3a chronic kidney disease (CKD) ([place]) 05/13/2014   Essential (primary) hypertension 05/13/2014   History of cardiac catheterization 05/13/2014   HLD (hyperlipidemia) 05/13/2014   Combined fat and carbohydrate induced hyperlipemia 05/13/2014   History of cardiovascular surgery 05/13/2014   History of anticoagulant therapy 04/28/2014   Warfarin anticoagulation 04/28/2014   Benign prostatic hyperplasia with urinary obstruction 11/05/2013    REFERRING DIAG: LBP  THERAPY DIAG:  Unsteadiness on feet  Other low back pain  Difficulty in walking, not elsewhere classified  Rationale for Evaluation and Treatment Rehabilitation  PERTINENT HISTORY: LBP, balance. Pt fell 02/17/2022 while trying to step up onto his half step to get into his home in the garage. Pt tripped on the rug which is no longer there. Pt fractured L hip S/P L hip hemiarthroplasty on 02/19/2022. Pt underwent home health PT for the hip rehab. Pt fell again a couple of weeks after participating in rehab resulting in lumbar fracture. Does not remember what caused the fall. Pt was doing something in the hall. Pt does not remember if he was using an AD. Pt had a shot in his back which took away the pain. Pt started using his SPC a couple of weeks ago. Home health PT ended last week. Independent ambulation prior to L hip fracture.  PRECAUTIONS: fall risk,   SUBJECTIVE: Back does ok in the afternoon as well. Balance feels like it is getting better. Does not use the cane or walker around the house.    PAIN:  Are you having pain? 0/10      TODAY'S TREATMENT:   Therapeutic exerise   Gait with SPC on R side, cues for increasing R LE step length 200 ft  Sit <> stand 10x  Standing up from a chair, walking 10 ft forward, then returning 10 ft, then sitting back onto chair 3x  With SPC: 15.45 seconds 14.82 seconds, 14.42 seconds (15 seconds average)   Sit <> stand  5x fast with B UE assist    Directed patient with gait with normal gait speed, with changes in speed, 180 degree pivot turn, with R and L cervical rotation position, with cervical flexion and extension position, stepping around obstacles, stepping over an obstacle, ascending and descending 4 regular steps with UE assist    Stepping over 2 mini hurdles with SPC, CGA assist 10x  Forward step up onto and over 4 inch step with SPC 6x to simulate curb negotiation.     Gait from gym to car with [facility], cues for increasing step length.        Improved exercise technique, movement at target joints, use of target muscles after  mod verbal, visual, tactile cues.         Response to treatment Pt tolerated session well without aggravation of symptoms.      Clinical impression Pt making very good progress with decreased low back pain, improved strength, function, and balance since initial evaluation. Improved ability to negotiate steps and obstacles. Pt will benefit from continued skilled physical therapy services to continue progress, as well as decrease fall risk.         PATIENT EDUCATION: Education details: there-ex, HEP Person educated: Patient Education method: Explanation, Demonstration, Tactile cues, Verbal cues, and Handouts Education comprehension: verbalized understanding and returned demonstration   HOME EXERCISE PROGRAM:       Access Code: [email or web address] URL: [email or web address] Date: 07/05/2022 Prepared by: Joneen Boers    Exercises - Standing Hip Abduction with Counter Support  - 2 x daily - 7 x weekly - 1 sets - 10 reps - 5 seconds hold - Sit to Stand with Armchair  - 1 x daily - 3 x weekly - 2 sets - 8 reps - Scapular Retraction with Resistance  - 1 x daily - 3 x weekly - 3 sets - 12 reps The HEP added last session (07/19/2022) per pt: sit <> stand, stepping over obstacles, walking forward and back    PT Short Term Goals - 08/10/22 1019       PT  SHORT TERM GOAL #1   Title Pt will be independent with his initial HEP to decrease pain, improve strength, balance, and function.    Baseline Pt has started his initial HEP (07/05/2022); 08/10/2022 reports independence compelting as prescribed.    Time 3    Period Weeks    Status Achieved    Target Date 08/10/22              PT Long Term Goals - 10/04/22 0945       PT LONG TERM GOAL #1   Title Patient will have a decrease in low back pain to 4/10 or less at worst to promote ability to perform standing tasks, ambulate, and negotiate stairs with less difficulty.    Baseline 10/10 low back pain at worst for the past 3 months (07/05/2022); denies back pain with standing, walking or stair negotiation; 4/10 at worst for the past 7 days (09/05/2022)    Time 8    Period Weeks    Status Achieved    Target Date 08/10/22      PT LONG TERM GOAL #2   Title Patient will improve his lumbar FOTO score by at least 10 points as a demonstration of improved function.    Baseline Lumbar spine FOTO 40 (07/05/2022); 52    Time 8    Period Weeks    Status Achieved    Target Date 08/10/22      PT LONG TERM GOAL #3   Title Pt will improve his TUG score to 12 seconds or less without SPC as a demonstration of improved balance and functional mobility.    Baseline 18.33 seconds average with SPC on R side (07/05/2022); 22.05 sec  with SPC on RUE; 09/05/2022); 15 seconds average with SPC (10/04/2022)    Time 8    Period Weeks    Status Partially Met    Target Date 11/03/22      PT LONG TERM GOAL #4   Title Pt will improve bilateral bilateral hip extension and abduction to promote ability to perform standing tasks more steadily.    Baseline Hip extension 4+/5  R and L, hip abduction 4+/5 R, 4/5 L (07/05/2022); deferred for primary PT; hip extension 5/5 R and L, hip abduction 5/5 R and L (09/05/2022)    Time 8    Period Weeks    Status Achieved    Target Date 09/01/22      PT LONG TERM GOAL #5   Title Pt will  improve 5xSTS without UE support to 14.8 sec or less to demonstrate clinically significant improvement for age matched norms for LE strength and reduced risk of falls.    Baseline 08/10/22: 27.29 with UE's on thighs standard height chair; B UE assist 14.18 seconds (09/05/2022); 13.50 seconds (10/04/2022)    Time 8    Period Weeks    Status Achieved    Target Date 11/03/22      Additional Long Term Goals   Additional Long Term Goals Yes      PT LONG TERM GOAL #6   Title Pt will improved DGI score to > 19 points as a demonstration of improved balance.    Baseline DGI 17 points    Time 4    Period Weeks    Status New    Target Date 11/03/22              Plan - 10/04/22 0934     Clinical Impression Statement Pt making very good progress with decreased low back pain, improved strength, function, and balance since initial evaluation. Improved ability to negotiate steps and obstacles. Pt will benefit from continued skilled physical therapy services to continue progress, as well as decrease fall risk.    Personal Factors and Comorbidities Age;Comorbidity 3+;Past/Current Experience;Time since onset of injury/illness/exacerbation    Comorbidities A-fib, arthritis, basal cell CA, chronic kidney disease, dysrhythmia, HTN, MI, phlebitis, L hip hemiarthroplasty    Examination-Activity Limitations Stairs;Stand;Bend;Locomotion Level;Transfers    Stability/Clinical Decision Making Stable/Uncomplicated    Clinical Decision Making Low    Rehab Potential Fair    PT Frequency 2x / week    PT Duration 8 weeks    PT Treatment/Interventions Therapeutic exercise;Therapeutic activities;Stair training;Functional mobility training;Balance training;Neuromuscular re-education;Patient/family education;Manual techniques;Dry needling;Aquatic Therapy;Electrical Stimulation;Iontophoresis 55m/ml Dexamethasone;Gait training    PT Next Visit Plan posture, thoracic and hip extension, trunk and hip strengthening, manual  techniques, modalities PRN    PT Home Exercise Plan Medbridge Access Code: [email or web address]   Consulted and Agree with Plan of Care Patient                       Thank you for your referral.    MJoneen BoersPT, DPT   10/04/2022, 10:23 AM

## 2022-10-06 ENCOUNTER — Ambulatory Visit: Payer: Medicare Other

## 2022-10-06 DIAGNOSIS — R2681 Unsteadiness on feet: Secondary | ICD-10-CM | POA: Diagnosis not present

## 2022-10-06 DIAGNOSIS — M5459 Other low back pain: Secondary | ICD-10-CM

## 2022-10-06 DIAGNOSIS — R262 Difficulty in walking, not elsewhere classified: Secondary | ICD-10-CM

## 2022-10-06 NOTE — Therapy (Signed)
OUTPATIENT PHYSICAL THERAPY TREATMENT NOTE    Patient Name: Robert Rivas MRN: [id number] DOB:[birth year], 81 y.o., male Today's Date: 10/06/2022  PCP: Idelle Crouch, MD REFERRING PROVIDER: Idelle Crouch, MD   PT End of Session - 10/06/22 0927     Visit Number 21    Number of Visits 63    Date for PT Re-Evaluation 11/03/22    Authorization Type UHC Medicare Primary; Tric[employer] for Life sceondary    Authorization Time Period 07/05/22-09/01/22    PT Start Time 0924    PT Stop Time 1011    PT Time Calculation (min) 47 min    Equipment Utilized During Treatment Gait belt    Activity Tolerance Patient tolerated treatment well    Behavior During Therapy WFL for tasks assessed/performed                            Past Medical History:  Diagnosis Date   A-fib ([place])    Arthritis    lower back   Basal cell carcinoma 10/29/2020   Right post base of skull/neck   BPH (benign prostatic hyperplasia)    Cancer (HCC)    Skin Cancer   Chronic kidney disease    Chronic Kidney Disease   Coronary artery disease    Dysrhythmia    Atrial Fibrillation; Supraventricular Tachycardia   Gout    Hearing aid worn    has, does not wear   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   L upper back 5.0cm inf to base of neck, 5.0cm lat to spine   Hyperlipidemia    Hypertension    MI, old    Nocturia    Phlebitis    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   SCC IS R foreearm distal   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   SCC IS R forearm proximal   Squamous cell carcinoma of skin 03/26/2018   SCC IS R forearm   Squamous cell carcinoma of skin 01/01/2008   SCC IS R forearm   Wears dentures    partial   Past Surgical History:  Procedure Laterality Date   cardaic stents     CARDIAC CATHETERIZATION     PTCA with Stent Placement   CARDIOVERSION N/A 04/13/2021   Procedure: CARDIOVERSION;  Surgeon: Corey Skains, MD;  Location: [facility] ORS;  Service: Cardiovascular;   Laterality: N/A;   CARDIOVERSION N/A 05/05/2021   Procedure: CARDIOVERSION;  Surgeon: Corey Skains, MD;  Location: [facility];  Service: Cardiovascular;  Laterality: N/A;   COLONOSCOPY WITH PROPOFOL N/A 12/15/2015   Procedure: COLONOSCOPY WITH PROPOFOL;  Surgeon: Lollie Sails, MD;  Location: [facility] ENDOSCOPY;  Service: Endoscopy;  Laterality: N/A;   CORONARY ARTERY BYPASS GRAFT  1992   ECTROPION REPAIR Right 12/12/2017   Procedure: REPAIR OF ECTROPION SUTURES/EXTENSIVE RIGHT;  Surgeon: Karle Starch, MD;  Location: [place];  Service: Ophthalmology;  Laterality: Right;   HERNIA REPAIR     HIP ARTHROPLASTY Left 02/19/2022   Procedure: ARTHROPLASTY BIPOLAR HIP (HEMIARTHROPLASTY);  Surgeon: Thornton Park, MD;  Location: [facility] ORS;  Service: Orthopedics;  Laterality: Left;   IR KYPHO LUMBAR INC FX REDUCE BONE BX UNI/BIL CANNULATION INC/IMAGING  06/07/2022   IR RADIOLOGIST EVAL & MGMT  06/30/2022   SVT ABLATION  02/22/2017   [facility]   Patient Active Problem List   Diagnosis Date Noted   Acute urinary retention 02/19/2022   Acute delirium 02/18/2022  Dysphagia 02/18/2022   Hyponatremia 02/15/2022   Closed left hip fracture ([place]) 02/14/2022   Atrial fibrillation with RVR ([place]) 04/12/2021   Bradycardia 07/08/2019   Venous insufficiency of both lower extremities 07/08/2019   Abnormal glucose 01/10/2019   Elevated PSA 01/10/2019   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   Palpitations 06/13/2018   Leg edema, right 09/20/2017   History of BPH 02/21/2017   BPH (benign prostatic hyperplasia) 04/25/2016   Nocturia 04/25/2016   Gout 03/25/2016   History of colon polyps 03/25/2016   Inflammation of a vein 03/25/2016   Percutaneous transluminal coronary angioplasty status 03/25/2016   Supraventricular tachycardia 03/25/2016   Benign essential HTN 06/10/2015   Paroxysmal atrial fibrillation ([place]) 01/20/2015   Atrial fibrillation ([place]) 05/13/2014   Coronary artery disease 05/13/2014   Chest pain  05/13/2014   Stage 3a chronic kidney disease (CKD) ([place]) 05/13/2014   Essential (primary) hypertension 05/13/2014   History of cardiac catheterization 05/13/2014   HLD (hyperlipidemia) 05/13/2014   Combined fat and carbohydrate induced hyperlipemia 05/13/2014   History of cardiovascular surgery 05/13/2014   History of anticoagulant therapy 04/28/2014   Warfarin anticoagulation 04/28/2014   Benign prostatic hyperplasia with urinary obstruction 11/05/2013    REFERRING DIAG: LBP  THERAPY DIAG:  Unsteadiness on feet  Other low back pain  Difficulty in walking, not elsewhere classified  Rationale for Evaluation and Treatment Rehabilitation  PERTINENT HISTORY: LBP, balance. Pt fell 02/17/2022 while trying to step up onto his half step to get into his home in the garage. Pt tripped on the rug which is no longer there. Pt fractured L hip S/P L hip hemiarthroplasty on 02/19/2022. Pt underwent home health PT for the hip rehab. Pt fell again a couple of weeks after participating in rehab resulting in lumbar fracture. Does not remember what caused the fall. Pt was doing something in the hall. Pt does not remember if he was using an AD. Pt had a shot in his back which took away the pain. Pt started using his SPC a couple of weeks ago. Home health PT ended last week. Independent ambulation prior to L hip fracture.  PRECAUTIONS: fall risk,   SUBJECTIVE: doing  ok.    PAIN:  Are you having pain? 0/10      TODAY'S TREATMENT:   Therapeutic exerise   Gait with SPC on R side, cues for increasing R LE step length 200 ft  Sit <> stand 10x  Stepping over mini hurdle forward and side 3x2 with SPC and CGA  Stepping forward over 4 mini hurdles with SPC CGA 4x  Forward step over and back 1 mini hurdles with SPC, CGA 6x  Forward step up onto and over 4 inch step with SPC 10x to simulate curb negotiation.   Standing hip abduction with B UE assist   Yellow band around ankles    R 10x3   L  10x3   Standing hip extension with B UE assist   Yellow band around ankles    R 10x2   L 10x2   Standing B shoulder extension red band 10x   Gait from gym to car with SPC, cues for increasing step length.        Improved exercise technique, movement at target joints, use of target muscles after mod verbal, visual, tactile cues.         Response to treatment Pt tolerated session well without aggravation of symptoms.      Clinical impression  Worked on glute strength and obstacle  negotiation to decrease fall risk. Cues to place and maintain his center of gravity over his base of support. Overall able to negotiate obstacles with use of SPC. Pt tolerated session well without aggravation of symptoms. Pt will benefit from continued skilled physical therapy services to continue progress, as well as decrease fall risk.         PATIENT EDUCATION: Education details: there-ex, HEP Person educated: Patient Education method: Explanation, Demonstration, Tactile cues, Verbal cues, and Handouts Education comprehension: verbalized understanding and returned demonstration   HOME EXERCISE PROGRAM:       Access Code: [email or web address] URL: [email or web address] Date: 07/05/2022 Prepared by: Joneen Boers    Exercises - Standing Hip Abduction with Counter Support  - 2 x daily - 7 x weekly - 1 sets - 10 reps - 5 seconds hold - Sit to Stand with Armchair  - 1 x daily - 3 x weekly - 2 sets - 8 reps - Scapular Retraction with Resistance  - 1 x daily - 3 x weekly - 3 sets - 12 reps The HEP added last session (07/19/2022) per pt: sit <> stand, stepping over obstacles, walking forward and back - Shoulder extension with resistance - Neutral  - 1 x daily - 7 x weekly - 1 sets - 10 reps - 5 seconds hold (RED BAND)   PT Short Term Goals - 08/10/22 1019       PT SHORT TERM GOAL #1   Title Pt will be independent with his initial HEP to decrease pain, improve strength, balance, and  function.    Baseline Pt has started his initial HEP (07/05/2022); 08/10/2022 reports independence compelting as prescribed.    Time 3    Period Weeks    Status Achieved    Target Date 08/10/22              PT Long Term Goals - 10/04/22 0945       PT LONG TERM GOAL #1   Title Patient will have a decrease in low back pain to 4/10 or less at worst to promote ability to perform standing tasks, ambulate, and negotiate stairs with less difficulty.    Baseline 10/10 low back pain at worst for the past 3 months (07/05/2022); denies back pain with standing, walking or stair negotiation; 4/10 at worst for the past 7 days (09/05/2022)    Time 8    Period Weeks    Status Achieved    Target Date 08/10/22      PT LONG TERM GOAL #2   Title Patient will improve his lumbar FOTO score by at least 10 points as a demonstration of improved function.    Baseline Lumbar spine FOTO 40 (07/05/2022); 52    Time 8    Period Weeks    Status Achieved    Target Date 08/10/22      PT LONG TERM GOAL #3   Title Pt will improve his TUG score to 12 seconds or less without SPC as a demonstration of improved balance and functional mobility.    Baseline 18.33 seconds average with SPC on R side (07/05/2022); 22.05 sec  with SPC on RUE; 09/05/2022); 15 seconds average with SPC (10/04/2022)    Time 8    Period Weeks    Status Partially Met    Target Date 11/03/22      PT LONG TERM GOAL #4   Title Pt will improve bilateral bilateral hip extension and abduction to promote ability to perform standing tasks more steadily.  Baseline Hip extension 4+/5 R and L, hip abduction 4+/5 R, 4/5 L (07/05/2022); deferred for primary PT; hip extension 5/5 R and L, hip abduction 5/5 R and L (09/05/2022)    Time 8    Period Weeks    Status Achieved    Target Date 09/01/22      PT LONG TERM GOAL #5   Title Pt will improve 5xSTS without UE support to 14.8 sec or less to demonstrate clinically significant improvement for age matched  norms for LE strength and reduced risk of falls.    Baseline 08/10/22: 27.29 with UE's on thighs standard height chair; B UE assist 14.18 seconds (09/05/2022); 13.50 seconds (10/04/2022)    Time 8    Period Weeks    Status Achieved    Target Date 11/03/22      Additional Long Term Goals   Additional Long Term Goals Yes      PT LONG TERM GOAL #6   Title Pt will improved DGI score to > 19 points as a demonstration of improved balance.    Baseline DGI 17 points    Time 4    Period Weeks    Status New    Target Date 11/03/22              Plan - 10/06/22 0935     Clinical Impression Statement Worked on glute strength and obstacle negotiation to decrease fall risk. Cues to place and maintain his center of gravity over his base of support. Overall able to negotiate obstacles with use of SPC. Pt tolerated session well without aggravation of symptoms. Pt will benefit from continued skilled physical therapy services to continue progress, as well as decrease fall risk.    Personal Factors and Comorbidities Age;Comorbidity 3+;Past/Current Experience;Time since onset of injury/illness/exacerbation    Comorbidities A-fib, arthritis, basal cell CA, chronic kidney disease, dysrhythmia, HTN, MI, phlebitis, L hip hemiarthroplasty    Examination-Activity Limitations Stairs;Stand;Bend;Locomotion Level;Transfers    Stability/Clinical Decision Making Stable/Uncomplicated    Rehab Potential Fair    PT Frequency 2x / week    PT Duration 8 weeks    PT Treatment/Interventions Therapeutic exercise;Therapeutic activities;Stair training;Functional mobility training;Balance training;Neuromuscular re-education;Patient/family education;Manual techniques;Dry needling;Aquatic Therapy;Electrical Stimulation;Iontophoresis 35m/ml Dexamethasone;Gait training    PT Next Visit Plan posture, thoracic and hip extension, trunk and hip strengthening, manual techniques, modalities PRN    PT Home Exercise Plan Medbridge Access  Code: [email or web address]   Consulted and Agree with Plan of Care Patient                         MJoneen BoersPT, DPT   10/06/2022, 5:26 PM

## 2022-10-10 ENCOUNTER — Ambulatory Visit: Payer: Medicare Other

## 2022-10-11 ENCOUNTER — Ambulatory Visit: Payer: Medicare Other

## 2022-10-11 DIAGNOSIS — R2681 Unsteadiness on feet: Secondary | ICD-10-CM | POA: Diagnosis not present

## 2022-10-11 DIAGNOSIS — M5459 Other low back pain: Secondary | ICD-10-CM

## 2022-10-11 DIAGNOSIS — R262 Difficulty in walking, not elsewhere classified: Secondary | ICD-10-CM

## 2022-10-11 NOTE — Therapy (Signed)
OUTPATIENT PHYSICAL THERAPY TREATMENT NOTE    Patient Name: Robert Rivas MRN: [id number] DOB:[birth year], 81 y.o., male Today's Date: 10/11/2022  PCP: Idelle Crouch, MD REFERRING PROVIDER: Idelle Crouch, MD   PT End of Session - 10/11/22 1103     Visit Number 22    Number of Visits 32    Date for PT Re-Evaluation 11/03/22    Authorization Type UHC Medicare Primary; Tricare for Life sceondary    PT Start Time 1103    PT Stop Time 1143    PT Time Calculation (min) 40 min    Equipment Utilized During Treatment Gait belt    Activity Tolerance Patient tolerated treatment well    Behavior During Therapy WFL for tasks assessed/performed                             Past Medical History:  Diagnosis Date   A-fib ([place])    Arthritis    lower back   Basal cell carcinoma 10/29/2020   Right post base of skull/neck   BPH (benign prostatic hyperplasia)    Cancer (HCC)    Skin Cancer   Chronic kidney disease    Chronic Kidney Disease   Coronary artery disease    Dysrhythmia    Atrial Fibrillation; Supraventricular Tachycardia   Gout    Hearing aid worn    has, does not wear   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   L upper back 5.0cm inf to base of neck, 5.0cm lat to spine   Hyperlipidemia    Hypertension    MI, old    Nocturia    Phlebitis    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   SCC IS R foreearm distal   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   SCC IS R forearm proximal   Squamous cell carcinoma of skin 03/26/2018   SCC IS R forearm   Squamous cell carcinoma of skin 01/01/2008   SCC IS R forearm   Wears dentures    partial   Past Surgical History:  Procedure Laterality Date   cardaic stents     CARDIAC CATHETERIZATION     PTCA with Stent Placement   CARDIOVERSION N/A 04/13/2021   Procedure: CARDIOVERSION;  Surgeon: Corey Skains, MD;  Location: [facility] ORS;  Service: Cardiovascular;  Laterality: N/A;   CARDIOVERSION N/A  05/05/2021   Procedure: CARDIOVERSION;  Surgeon: Corey Skains, MD;  Location: [facility];  Service: Cardiovascular;  Laterality: N/A;   COLONOSCOPY WITH PROPOFOL N/A 12/15/2015   Procedure: COLONOSCOPY WITH PROPOFOL;  Surgeon: Lollie Sails, MD;  Location: [facility] ENDOSCOPY;  Service: Endoscopy;  Laterality: N/A;   CORONARY ARTERY BYPASS GRAFT  1992   ECTROPION REPAIR Right 12/12/2017   Procedure: REPAIR OF ECTROPION SUTURES/EXTENSIVE RIGHT;  Surgeon: Karle Starch, MD;  Location: [place];  Service: Ophthalmology;  Laterality: Right;   HERNIA REPAIR     HIP ARTHROPLASTY Left 02/19/2022   Procedure: ARTHROPLASTY BIPOLAR HIP (HEMIARTHROPLASTY);  Surgeon: Thornton Park, MD;  Location: [facility];  Service: Orthopedics;  Laterality: Left;   IR KYPHO LUMBAR INC FX REDUCE BONE BX UNI/BIL CANNULATION INC/IMAGING  06/07/2022   IR RADIOLOGIST EVAL & MGMT  06/30/2022   SVT ABLATION  02/22/2017   [facility]   Patient Active Problem List   Diagnosis Date Noted   Acute urinary retention 02/19/2022   Acute delirium 02/18/2022   Dysphagia 02/18/2022   Hyponatremia 02/15/2022  Closed left hip fracture ([place]) 02/14/2022   Atrial fibrillation with RVR ([place]) 04/12/2021   Bradycardia 07/08/2019   Venous insufficiency of both lower extremities 07/08/2019   Abnormal glucose 01/10/2019   Elevated PSA 01/10/2019   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   Palpitations 06/13/2018   Leg edema, right 09/20/2017   History of BPH 02/21/2017   BPH (benign prostatic hyperplasia) 04/25/2016   Nocturia 04/25/2016   Gout 03/25/2016   History of colon polyps 03/25/2016   Inflammation of a vein 03/25/2016   Percutaneous transluminal coronary angioplasty status 03/25/2016   Supraventricular tachycardia 03/25/2016   Benign essential HTN 06/10/2015   Paroxysmal atrial fibrillation ([place]) 01/20/2015   Atrial fibrillation ([place]) 05/13/2014   Coronary artery disease 05/13/2014   Chest pain 05/13/2014   Stage 3a chronic kidney  disease (CKD) ([place]) 05/13/2014   Essential (primary) hypertension 05/13/2014   History of cardiac catheterization 05/13/2014   HLD (hyperlipidemia) 05/13/2014   Combined fat and carbohydrate induced hyperlipemia 05/13/2014   History of cardiovascular surgery 05/13/2014   History of anticoagulant therapy 04/28/2014   Warfarin anticoagulation 04/28/2014   Benign prostatic hyperplasia with urinary obstruction 11/05/2013    REFERRING DIAG: LBP  THERAPY DIAG:  Unsteadiness on feet  Other low back pain  Difficulty in walking, not elsewhere classified  Rationale for Evaluation and Treatment Rehabilitation  PERTINENT HISTORY: LBP, balance. Pt fell 02/17/2022 while trying to step up onto his half step to get into his home in the garage. Pt tripped on the rug which is no longer there. Pt fractured L hip S/P L hip hemiarthroplasty on 02/19/2022. Pt underwent home health PT for the hip rehab. Pt fell again a couple of weeks after participating in rehab resulting in lumbar fracture. Does not remember what caused the fall. Pt was doing something in the hall. Pt does not remember if he was using an AD. Pt had a shot in his back which took away the pain. Pt started using his SPC a couple of weeks ago. Home health PT ended last week. Independent ambulation prior to L hip fracture.  PRECAUTIONS: fall risk,   SUBJECTIVE: Back is doing pretty good.    PAIN:  Are you having pain? 0/10      TODAY'S TREATMENT:   Therapeutic exerise   Gait with SPC on R side, cues for increasing R LE step length 200 ft  Side stepping 32 ft to the R and 32 ft to the L with minimal use of SPC.  To promote balance and glute med muscle strengthening.   Gait 32 ft with use of AD with 2 mini hurdles, a 4 inch step, then 2 mini hurdles 4x  Stepping over mini hurdle forward and side 5x2 with SPC and CGA  Forward step up onto an over Air Ex pad with contralateral UE light touch assist  R 5x2  L 5x2   Standing hip  abduction with B UE assist   Yellow band around ankles    R 10x3   L 10x3  Standing hip extension with B UE assist   Yellow band around ankles    R 10x2   L 10x2   Gait from gym to car with SPC, cues for increasing step length.     Improved exercise technique, movement at target joints, use of target muscles after mod verbal, visual, tactile cues.         Response to treatment Pt tolerated session well without aggravation of symptoms.      Clinical impression  Continued working on [employer] and obstacle negotiation to decrease fall risk. Cues to place and maintain his center of gravity over his base of support. Pt tolerated session well without reports of pain. Pt will benefit from continued skilled physical therapy services to continue progress, as well as decrease fall risk.         PATIENT EDUCATION: Education details: there-ex, HEP Person educated: Patient Education method: Explanation, Demonstration, Tactile cues, Verbal cues, and Handouts Education comprehension: verbalized understanding and returned demonstration   HOME EXERCISE PROGRAM:       Access Code: [email or web address] URL: [email or web address] Date: 07/05/2022 Prepared by: Joneen Boers    Exercises - Standing Hip Abduction with Counter Support  - 2 x daily - 7 x weekly - 1 sets - 10 reps - 5 seconds hold - Sit to Stand with Armchair  - 1 x daily - 3 x weekly - 2 sets - 8 reps - Scapular Retraction with Resistance  - 1 x daily - 3 x weekly - 3 sets - 12 reps The HEP added last session (07/19/2022) per pt: sit <> stand, stepping over obstacles, walking forward and back - Shoulder extension with resistance - Neutral  - 1 x daily - 7 x weekly - 1 sets - 10 reps - 5 seconds hold (RED BAND)   PT Short Term Goals - 08/10/22 1019       PT SHORT TERM GOAL #1   Title Pt will be independent with his initial HEP to decrease pain, improve strength, balance, and function.    Baseline Pt has  started his initial HEP (07/05/2022); 08/10/2022 reports independence compelting as prescribed.    Time 3    Period Weeks    Status Achieved    Target Date 08/10/22              PT Long Term Goals - 10/04/22 0945       PT LONG TERM GOAL #1   Title Patient will have a decrease in low back pain to 4/10 or less at worst to promote ability to perform standing tasks, ambulate, and negotiate stairs with less difficulty.    Baseline 10/10 low back pain at worst for the past 3 months (07/05/2022); denies back pain with standing, walking or stair negotiation; 4/10 at worst for the past 7 days (09/05/2022)    Time 8    Period Weeks    Status Achieved    Target Date 08/10/22      PT LONG TERM GOAL #2   Title Patient will improve his lumbar FOTO score by at least 10 points as a demonstration of improved function.    Baseline Lumbar spine FOTO 40 (07/05/2022); 52    Time 8    Period Weeks    Status Achieved    Target Date 08/10/22      PT LONG TERM GOAL #3   Title Pt will improve his TUG score to 12 seconds or less without SPC as a demonstration of improved balance and functional mobility.    Baseline 18.33 seconds average with SPC on R side (07/05/2022); 22.05 sec  with SPC on RUE; 09/05/2022); 15 seconds average with SPC (10/04/2022)    Time 8    Period Weeks    Status Partially Met    Target Date 11/03/22      PT LONG TERM GOAL #4   Title Pt will improve bilateral bilateral hip extension and abduction to promote ability to perform standing tasks more steadily.  Baseline Hip extension 4+/5 R and L, hip abduction 4+/5 R, 4/5 L (07/05/2022); deferred for primary PT; hip extension 5/5 R and L, hip abduction 5/5 R and L (09/05/2022)    Time 8    Period Weeks    Status Achieved    Target Date 09/01/22      PT LONG TERM GOAL #5   Title Pt will improve 5xSTS without UE support to 14.8 sec or less to demonstrate clinically significant improvement for age matched norms for LE strength and  reduced risk of falls.    Baseline 08/10/22: 27.29 with UE's on thighs standard height chair; B UE assist 14.18 seconds (09/05/2022); 13.50 seconds (10/04/2022)    Time 8    Period Weeks    Status Achieved    Target Date 11/03/22      Additional Long Term Goals   Additional Long Term Goals Yes      PT LONG TERM GOAL #6   Title Pt will improved DGI score to > 19 points as a demonstration of improved balance.    Baseline DGI 17 points    Time 4    Period Weeks    Status New    Target Date 11/03/22              Plan - 10/11/22 1102     Clinical Impression Statement Continued working on glute strength and obstacle negotiation to decrease fall risk. Cues to place and maintain his center of gravity over his base of support. Pt tolerated session well without reports of pain. Pt will benefit from continued skilled physical therapy services to continue progress, as well as decrease fall risk.    Personal Factors and Comorbidities Age;Comorbidity 3+;Past/Current Experience;Time since onset of injury/illness/exacerbation    Comorbidities A-fib, arthritis, basal cell CA, chronic kidney disease, dysrhythmia, HTN, MI, phlebitis, L hip hemiarthroplasty    Examination-Activity Limitations Stairs;Stand;Bend;Locomotion Level;Transfers    Stability/Clinical Decision Making Stable/Uncomplicated    Clinical Decision Making Low    Rehab Potential Fair    PT Frequency 2x / week    PT Duration 8 weeks    PT Treatment/Interventions Therapeutic exercise;Therapeutic activities;Stair training;Functional mobility training;Balance training;Neuromuscular re-education;Patient/family education;Manual techniques;Dry needling;Aquatic Therapy;Electrical Stimulation;Iontophoresis 45m/ml Dexamethasone;Gait training    PT Next Visit Plan posture, thoracic and hip extension, trunk and hip strengthening, manual techniques, modalities PRN    PT Home Exercise Plan Medbridge Access Code: [email or web address]   Consulted and Agree with  Plan of Care Patient                         MJoneen BoersPT, DPT   10/11/2022, 12:04 PM

## 2022-10-13 ENCOUNTER — Ambulatory Visit: Payer: Medicare Other

## 2022-10-13 DIAGNOSIS — R262 Difficulty in walking, not elsewhere classified: Secondary | ICD-10-CM

## 2022-10-13 DIAGNOSIS — R2681 Unsteadiness on feet: Secondary | ICD-10-CM

## 2022-10-13 DIAGNOSIS — M5459 Other low back pain: Secondary | ICD-10-CM

## 2022-10-13 NOTE — Therapy (Signed)
OUTPATIENT PHYSICAL THERAPY TREATMENT NOTE    Patient Name: Robert Rivas MRN: [id number] DOB:02/22/1941, 81 y.o., male Today's Date: 10/13/2022  PCP: Idelle Crouch, MD REFERRING PROVIDER: Idelle Crouch, MD   PT End of Session - 10/13/22 1020     Visit Number 23    Number of Visits 46    Date for PT Re-Evaluation 11/03/22    Authorization Type UHC Medicare Primary; Tricare for Life sceondary    PT Start Time 1020    PT Stop Time 1102    PT Time Calculation (min) 42 min    Equipment Utilized During Treatment Gait belt    Activity Tolerance Patient tolerated treatment well    Behavior During Therapy WFL for tasks assessed/performed                              Past Medical History:  Diagnosis Date   A-fib ([place])    Arthritis    lower back   Basal cell carcinoma 10/29/2020   Right post base of skull/neck   BPH (benign prostatic hyperplasia)    Cancer (HCC)    Skin Cancer   Chronic kidney disease    Chronic Kidney Disease   Coronary artery disease    Dysrhythmia    Atrial Fibrillation; Supraventricular Tachycardia   Gout    Hearing aid worn    has, does not wear   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   L upper back 5.0cm inf to base of neck, 5.0cm lat to spine   Hyperlipidemia    Hypertension    MI, old    Nocturia    Phlebitis    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   SCC IS R foreearm distal   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   SCC IS R forearm proximal   Squamous cell carcinoma of skin 03/26/2018   SCC IS R forearm   Squamous cell carcinoma of skin 01/01/2008   SCC IS R forearm   Wears dentures    partial   Past Surgical History:  Procedure Laterality Date   cardaic stents     CARDIAC CATHETERIZATION     PTCA with Stent Placement   CARDIOVERSION N/A 04/13/2021   Procedure: CARDIOVERSION;  Surgeon: Corey Skains, MD;  Location: [facility];  Service: Cardiovascular;  Laterality: N/A;   CARDIOVERSION N/A  05/05/2021   Procedure: CARDIOVERSION;  Surgeon: Corey Skains, MD;  Location: [facility];  Service: Cardiovascular;  Laterality: N/A;   COLONOSCOPY WITH PROPOFOL N/A 12/15/2015   Procedure: COLONOSCOPY WITH PROPOFOL;  Surgeon: Lollie Sails, MD;  Location: [facility];  Service: Endoscopy;  Laterality: N/A;   CORONARY ARTERY BYPASS GRAFT  1992   ECTROPION REPAIR Right 12/12/2017   Procedure: REPAIR OF ECTROPION SUTURES/EXTENSIVE RIGHT;  Surgeon: Karle Starch, MD;  Location: [place];  Service: Ophthalmology;  Laterality: Right;   HERNIA REPAIR     HIP ARTHROPLASTY Left 02/19/2022   Procedure: ARTHROPLASTY BIPOLAR HIP (HEMIARTHROPLASTY);  Surgeon: Thornton Park, MD;  Location: [facility];  Service: Orthopedics;  Laterality: Left;   IR KYPHO LUMBAR INC FX REDUCE BONE BX UNI/BIL CANNULATION INC/IMAGING  06/07/2022   IR RADIOLOGIST EVAL & MGMT  06/30/2022   SVT ABLATION  02/22/2017   [facility]   Patient Active Problem List   Diagnosis Date Noted   Acute urinary retention 02/19/2022   Acute delirium 02/18/2022   Dysphagia 02/18/2022   Hyponatremia  02/15/2022   Closed left hip fracture ([place]) 02/14/2022   Atrial fibrillation with RVR ([place]) 04/12/2021   Bradycardia 07/08/2019   Venous insufficiency of both lower extremities 07/08/2019   Abnormal glucose 01/10/2019   Elevated PSA 01/10/2019   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   Palpitations 06/13/2018   Leg edema, right 09/20/2017   History of BPH 02/21/2017   BPH (benign prostatic hyperplasia) 04/25/2016   Nocturia 04/25/2016   Gout 03/25/2016   History of colon polyps 03/25/2016   Inflammation of a vein 03/25/2016   Percutaneous transluminal coronary angioplasty status 03/25/2016   Supraventricular tachycardia 03/25/2016   Benign essential HTN 06/10/2015   Paroxysmal atrial fibrillation ([place]) 01/20/2015   Atrial fibrillation ([place]) 05/13/2014   Coronary artery disease 05/13/2014   Chest pain 05/13/2014   Stage 3a chronic kidney  disease (CKD) ([place]) 05/13/2014   Essential (primary) hypertension 05/13/2014   History of cardiac catheterization 05/13/2014   HLD (hyperlipidemia) 05/13/2014   Combined fat and carbohydrate induced hyperlipemia 05/13/2014   History of cardiovascular surgery 05/13/2014   History of anticoagulant therapy 04/28/2014   Warfarin anticoagulation 04/28/2014   Benign prostatic hyperplasia with urinary obstruction 11/05/2013    REFERRING DIAG: LBP  THERAPY DIAG:  Unsteadiness on feet  Other low back pain  Difficulty in walking, not elsewhere classified  Rationale for Evaluation and Treatment Rehabilitation  PERTINENT HISTORY: LBP, balance. Pt fell 02/17/2022 while trying to step up onto his half step to get into his home in the garage. Pt tripped on the rug which is no longer there. Pt fractured L hip S/P L hip hemiarthroplasty on 02/19/2022. Pt underwent home health PT for the hip rehab. Pt fell again a couple of weeks after participating in rehab resulting in lumbar fracture. Does not remember what caused the fall. Pt was doing something in the hall. Pt does not remember if he was using an AD. Pt had a shot in his back which took away the pain. Pt started using his SPC a couple of weeks ago. Home health PT ended last week. Independent ambulation prior to L hip fracture.  PRECAUTIONS: fall risk,   SUBJECTIVE: Back is doing pretty good. Has not been bothering him since his cortisone shot a few weeks ago.    PAIN:  Are you having pain? 0/10      TODAY'S TREATMENT:   Therapeutic exerise   Gait with SPC on R side, cues for increasing R LE step length 200 ft.   Pt able to not use AD at times. No LOB   Side stepping 32 ft to the R and 32 ft to the L for 2 sets with minimal use of SPC.  To promote balance and glute med muscle strengthening.   Stepping over 4 mini hurdles, 4 inch step and Air Ex Pad 4x with use of SPC  Forward step up onto an over Air Ex pad with contralateral UE light  touch assist PRN  R 10x  L 10x  Able to step off with minimal to no UE assist both sides  Standing hip abduction with B UE assist   Yellow band around ankles    R 10x3   L 10x3  Standing hip extension with B UE assist   Yellow band around ankles    R 10x2   L 10x2  Gait with R and L head turns 25 ft x 2 with minimal to no use of AD, CGA Gait with looking up and looking down 25 ft x 2 with  minimal to no use of AD, CGA No LOB or unsteadiness observed.     Gait from gym to car with [facility], cues for increasing step length.     Improved exercise technique, movement at target joints, use of target muscles after mod verbal, visual, tactile cues.         Response to treatment Pt tolerated session well without aggravation of symptoms.      Clinical impression  Continued working on glute strength and obstacle negotiation to decrease fall risk. Cues to place and maintain his center of gravity over his base of support. Able to ambulate with vertical and horizontal head turns without AD and no LOB. Pt tolerated session well without reports of pain. Pt will benefit from continued skilled physical therapy services to continue progress, as well as decrease fall risk.         PATIENT EDUCATION: Education details: there-ex, HEP Person educated: Patient Education method: Explanation, Demonstration, Tactile cues, Verbal cues, and Handouts Education comprehension: verbalized understanding and returned demonstration   HOME EXERCISE PROGRAM:       Access Code: [email or web address] URL: [email or web address] Date: 07/05/2022 Prepared by: Joneen Boers    Exercises - Standing Hip Abduction with Counter Support  - 2 x daily - 7 x weekly - 1 sets - 10 reps - 5 seconds hold - Sit to Stand with Armchair  - 1 x daily - 3 x weekly - 2 sets - 8 reps - Scapular Retraction with Resistance  - 1 x daily - 3 x weekly - 3 sets - 12 reps The HEP added last session (07/19/2022) per pt: sit <>  stand, stepping over obstacles, walking forward and back - Shoulder extension with resistance - Neutral  - 1 x daily - 7 x weekly - 1 sets - 10 reps - 5 seconds hold (RED BAND)   PT Short Term Goals - 08/10/22 1019       PT SHORT TERM GOAL #1   Title Pt will be independent with his initial HEP to decrease pain, improve strength, balance, and function.    Baseline Pt has started his initial HEP (07/05/2022); 08/10/2022 reports independence compelting as prescribed.    Time 3    Period Weeks    Status Achieved    Target Date 08/10/22              PT Long Term Goals - 10/04/22 0945       PT LONG TERM GOAL #1   Title Patient will have a decrease in low back pain to 4/10 or less at worst to promote ability to perform standing tasks, ambulate, and negotiate stairs with less difficulty.    Baseline 10/10 low back pain at worst for the past 3 months (07/05/2022); denies back pain with standing, walking or stair negotiation; 4/10 at worst for the past 7 days (09/05/2022)    Time 8    Period Weeks    Status Achieved    Target Date 08/10/22      PT LONG TERM GOAL #2   Title Patient will improve his lumbar FOTO score by at least 10 points as a demonstration of improved function.    Baseline Lumbar spine FOTO 40 (07/05/2022); 52    Time 8    Period Weeks    Status Achieved    Target Date 08/10/22      PT LONG TERM GOAL #3   Title Pt will improve his TUG score to 12 seconds or less without SPC as  a demonstration of improved balance and functional mobility.    Baseline 18.33 seconds average with SPC on R side (07/05/2022); 22.05 sec  with SPC on RUE; 09/05/2022); 15 seconds average with SPC (10/04/2022)    Time 8    Period Weeks    Status Partially Met    Target Date 11/03/22      PT LONG TERM GOAL #4   Title Pt will improve bilateral bilateral hip extension and abduction to promote ability to perform standing tasks more steadily.    Baseline Hip extension 4+/5 R and L, hip abduction 4+/5  R, 4/5 L (07/05/2022); deferred for primary PT; hip extension 5/5 R and L, hip abduction 5/5 R and L (09/05/2022)    Time 8    Period Weeks    Status Achieved    Target Date 09/01/22      PT LONG TERM GOAL #5   Title Pt will improve 5xSTS without UE support to 14.8 sec or less to demonstrate clinically significant improvement for age matched norms for LE strength and reduced risk of falls.    Baseline 08/10/22: 27.29 with UE's on thighs standard height chair; B UE assist 14.18 seconds (09/05/2022); 13.50 seconds (10/04/2022)    Time 8    Period Weeks    Status Achieved    Target Date 11/03/22      Additional Long Term Goals   Additional Long Term Goals Yes      PT LONG TERM GOAL #6   Title Pt will improved DGI score to > 19 points as a demonstration of improved balance.    Baseline DGI 17 points    Time 4    Period Weeks    Status New    Target Date 11/03/22              Plan - 10/13/22 1306     Clinical Impression Statement Continued working on glute strength and obstacle negotiation to decrease fall risk. Cues to place and maintain his center of gravity over his base of support. Able to ambulate with vertical and horizontal head turns without AD and no LOB. Pt tolerated session well without reports of pain. Pt will benefit from continued skilled physical therapy services to continue progress, as well as decrease fall risk.    Personal Factors and Comorbidities Age;Comorbidity 3+;Past/Current Experience;Time since onset of injury/illness/exacerbation    Comorbidities A-fib, arthritis, basal cell CA, chronic kidney disease, dysrhythmia, HTN, MI, phlebitis, L hip hemiarthroplasty    Examination-Activity Limitations Stairs;Stand;Bend;Locomotion Level;Transfers    Stability/Clinical Decision Making Stable/Uncomplicated    Rehab Potential Fair    PT Frequency 2x / week    PT Duration 8 weeks    PT Treatment/Interventions Therapeutic exercise;Therapeutic activities;Stair  training;Functional mobility training;Balance training;Neuromuscular re-education;Patient/family education;Manual techniques;Dry needling;Aquatic Therapy;Electrical Stimulation;Iontophoresis 72m/ml Dexamethasone;Gait training    PT Next Visit Plan posture, thoracic and hip extension, trunk and hip strengthening, manual techniques, modalities PRN    PT Home Exercise Plan Medbridge Access Code: [email or web address]   Consulted and Agree with Plan of Care Patient                          MJoneen BoersPT, DPT   10/13/2022, 1:08 PM

## 2022-10-17 ENCOUNTER — Ambulatory Visit: Payer: Medicare Other

## 2022-10-17 DIAGNOSIS — R2681 Unsteadiness on feet: Secondary | ICD-10-CM | POA: Diagnosis not present

## 2022-10-17 DIAGNOSIS — M5459 Other low back pain: Secondary | ICD-10-CM

## 2022-10-17 DIAGNOSIS — R262 Difficulty in walking, not elsewhere classified: Secondary | ICD-10-CM

## 2022-10-17 NOTE — Therapy (Signed)
OUTPATIENT PHYSICAL THERAPY TREATMENT NOTE    Patient Name: DELANEY SCHNICK MRN: [id number] DOB:[birth year], 81 y.o., male Today's Date: 10/17/2022  PCP: Idelle Crouch, MD REFERRING PROVIDER: Idelle Crouch, MD   PT End of Session - 10/17/22 1019     Visit Number 24    Number of Visits 49    Date for PT Re-Evaluation 11/03/22    Authorization Type UHC Medicare Primary; Tricare for Life sceondary    PT Start Time 1020    PT Stop Time 1102    PT Time Calculation (min) 42 min    Equipment Utilized During Treatment Gait belt    Activity Tolerance Patient tolerated treatment well    Behavior During Therapy WFL for tasks assessed/performed                               Past Medical History:  Diagnosis Date   A-fib ([place])    Arthritis    lower back   Basal cell carcinoma 10/29/2020   Right post base of skull/neck   BPH (benign prostatic hyperplasia)    Cancer (HCC)    Skin Cancer   Chronic kidney disease    Chronic Kidney Disease   Coronary artery disease    Dysrhythmia    Atrial Fibrillation; Supraventricular Tachycardia   Gout    Hearing aid worn    has, does not wear   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   L upper back 5.0cm inf to base of neck, 5.0cm lat to spine   Hyperlipidemia    Hypertension    MI, old    Nocturia    Phlebitis    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   SCC IS R foreearm distal   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   SCC IS R forearm proximal   Squamous cell carcinoma of skin 03/26/2018   SCC IS R forearm   Squamous cell carcinoma of skin 01/01/2008   SCC IS R forearm   Wears dentures    partial   Past Surgical History:  Procedure Laterality Date   cardaic stents     CARDIAC CATHETERIZATION     PTCA with Stent Placement   CARDIOVERSION N/A 04/13/2021   Procedure: CARDIOVERSION;  Surgeon: Corey Skains, MD;  Location: [facility];  Service: Cardiovascular;  Laterality: N/A;   CARDIOVERSION N/A  05/05/2021   Procedure: CARDIOVERSION;  Surgeon: Corey Skains, MD;  Location: [facility];  Service: Cardiovascular;  Laterality: N/A;   COLONOSCOPY WITH PROPOFOL N/A 12/15/2015   Procedure: COLONOSCOPY WITH PROPOFOL;  Surgeon: Lollie Sails, MD;  Location: [facility];  Service: Endoscopy;  Laterality: N/A;   CORONARY ARTERY BYPASS GRAFT  1992   ECTROPION REPAIR Right 12/12/2017   Procedure: REPAIR OF ECTROPION SUTURES/EXTENSIVE RIGHT;  Surgeon: Karle Starch, MD;  Location: [place];  Service: Ophthalmology;  Laterality: Right;   HERNIA REPAIR     HIP ARTHROPLASTY Left 02/19/2022   Procedure: ARTHROPLASTY BIPOLAR HIP (HEMIARTHROPLASTY);  Surgeon: Thornton Park, MD;  Location: [facility];  Service: Orthopedics;  Laterality: Left;   IR KYPHO LUMBAR INC FX REDUCE BONE BX UNI/BIL CANNULATION INC/IMAGING  06/07/2022   IR RADIOLOGIST EVAL & MGMT  06/30/2022   SVT ABLATION  02/22/2017   [facility]   Patient Active Problem List   Diagnosis Date Noted   Acute urinary retention 02/19/2022   Acute delirium 02/18/2022   Dysphagia 02/18/2022  Hyponatremia 02/15/2022   Closed left hip fracture ([place]) 02/14/2022   Atrial fibrillation with RVR ([place]) 04/12/2021   Bradycardia 07/08/2019   Venous insufficiency of both lower extremities 07/08/2019   Abnormal glucose 01/10/2019   Elevated PSA 01/10/2019   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   Palpitations 06/13/2018   Leg edema, right 09/20/2017   History of BPH 02/21/2017   BPH (benign prostatic hyperplasia) 04/25/2016   Nocturia 04/25/2016   Gout 03/25/2016   History of colon polyps 03/25/2016   Inflammation of a vein 03/25/2016   Percutaneous transluminal coronary angioplasty status 03/25/2016   Supraventricular tachycardia 03/25/2016   Benign essential HTN 06/10/2015   Paroxysmal atrial fibrillation ([place]) 01/20/2015   Atrial fibrillation ([place]) 05/13/2014   Coronary artery disease 05/13/2014   Chest pain 05/13/2014   Stage 3a chronic kidney  disease (CKD) ([place]) 05/13/2014   Essential (primary) hypertension 05/13/2014   History of cardiac catheterization 05/13/2014   HLD (hyperlipidemia) 05/13/2014   Combined fat and carbohydrate induced hyperlipemia 05/13/2014   History of cardiovascular surgery 05/13/2014   History of anticoagulant therapy 04/28/2014   Warfarin anticoagulation 04/28/2014   Benign prostatic hyperplasia with urinary obstruction 11/05/2013    REFERRING DIAG: LBP  THERAPY DIAG:  Unsteadiness on feet  Other low back pain  Difficulty in walking, not elsewhere classified  Rationale for Evaluation and Treatment Rehabilitation  PERTINENT HISTORY: LBP, balance. Pt fell 02/17/2022 while trying to step up onto his half step to get into his home in the garage. Pt tripped on the rug which is no longer there. Pt fractured L hip S/P L hip hemiarthroplasty on 02/19/2022. Pt underwent home health PT for the hip rehab. Pt fell again a couple of weeks after participating in rehab resulting in lumbar fracture. Does not remember what caused the fall. Pt was doing something in the hall. Pt does not remember if he was using an AD. Pt had a shot in his back which took away the pain. Pt started using his SPC a couple of weeks ago. Home health PT ended last week. Independent ambulation prior to L hip fracture.  PRECAUTIONS: fall risk,   SUBJECTIVE: 2/10 low back pain currently. Usually feels good after PT  PAIN:  Are you having pain? 2/10      TODAY'S TREATMENT:   Therapeutic exerise   Standing low rows red band with B scapular retraction 10x3 with 5 second holds  Seated B scapular retraction with transversus abdominis contraction 10x5 seconds for 3 sets   Standing hip abduction with B UE assist   Yellow band around ankles    R 10x3   L 10x3  Standing hip extension with B UE assist   Yellow band around ankles    R 10x2   L 10x2  Side stepping 32 ft to the R and 32 ft to the L with minimal use of SPC.  To promote  balance and glute med muscle strengthening.   Low back soreness.    Forward step up onto an over Air Ex pad with contralateral UE light touch assist PRN  R 10x  L 10x   SLS with B UE assist, emphasis on level pelvis for glute med strength   R 10x5 seconds   L 10x5 seconds  Gait from gym to car with SPC, cues for increasing step length.     Improved exercise technique, movement at target joints, use of target muscles after mod verbal, visual, tactile cues.         Response  to treatment Pt tolerated session well without aggravation of symptoms.      Clinical impression  Worked on improving thoracic extension, trunk and glute strength to decrease stress to low back during standing tasks. Decreased low back pain with thoracic extension and trunk strengthening exercises. Pt tolerated session well without aggravation of symptoms. Pt will benefit from continued skilled physical therapy services to decrease pain, improve strength, balance and function.         PATIENT EDUCATION: Education details: there-ex, HEP Person educated: Patient Education method: Explanation, Demonstration, Tactile cues, Verbal cues, and Handouts Education comprehension: verbalized understanding and returned demonstration   HOME EXERCISE PROGRAM:       Access Code: [email or web address] URL: [email or web address] Date: 07/05/2022 Prepared by: Joneen Boers    Exercises - Standing Hip Abduction with Counter Support  - 2 x daily - 7 x weekly - 1 sets - 10 reps - 5 seconds hold - Sit to Stand with Armchair  - 1 x daily - 3 x weekly - 2 sets - 8 reps - Scapular Retraction with Resistance  - 1 x daily - 3 x weekly - 3 sets - 12 reps The HEP added last session (07/19/2022) per pt: sit <> stand, stepping over obstacles, walking forward and back - Shoulder extension with resistance - Neutral  - 1 x daily - 7 x weekly - 1 sets - 10 reps - 5 seconds hold (RED BAND)   PT Short Term Goals - 08/10/22 1019        PT SHORT TERM GOAL #1   Title Pt will be independent with his initial HEP to decrease pain, improve strength, balance, and function.    Baseline Pt has started his initial HEP (07/05/2022); 08/10/2022 reports independence compelting as prescribed.    Time 3    Period Weeks    Status Achieved    Target Date 08/10/22              PT Long Term Goals - 10/04/22 0945       PT LONG TERM GOAL #1   Title Patient will have a decrease in low back pain to 4/10 or less at worst to promote ability to perform standing tasks, ambulate, and negotiate stairs with less difficulty.    Baseline 10/10 low back pain at worst for the past 3 months (07/05/2022); denies back pain with standing, walking or stair negotiation; 4/10 at worst for the past 7 days (09/05/2022)    Time 8    Period Weeks    Status Achieved    Target Date 08/10/22      PT LONG TERM GOAL #2   Title Patient will improve his lumbar FOTO score by at least 10 points as a demonstration of improved function.    Baseline Lumbar spine FOTO 40 (07/05/2022); 52    Time 8    Period Weeks    Status Achieved    Target Date 08/10/22      PT LONG TERM GOAL #3   Title Pt will improve his TUG score to 12 seconds or less without SPC as a demonstration of improved balance and functional mobility.    Baseline 18.33 seconds average with SPC on R side (07/05/2022); 22.05 sec  with SPC on RUE; 09/05/2022); 15 seconds average with SPC (10/04/2022)    Time 8    Period Weeks    Status Partially Met    Target Date 11/03/22      PT LONG TERM GOAL #4   Title  Pt will improve bilateral bilateral hip extension and abduction to promote ability to perform standing tasks more steadily.    Baseline Hip extension 4+/5 R and L, hip abduction 4+/5 R, 4/5 L (07/05/2022); deferred for primary PT; hip extension 5/5 R and L, hip abduction 5/5 R and L (09/05/2022)    Time 8    Period Weeks    Status Achieved    Target Date 09/01/22      PT LONG TERM GOAL #5    Title Pt will improve 5xSTS without UE support to 14.8 sec or less to demonstrate clinically significant improvement for age matched norms for LE strength and reduced risk of falls.    Baseline 08/10/22: 27.29 with UE's on thighs standard height chair; B UE assist 14.18 seconds (09/05/2022); 13.50 seconds (10/04/2022)    Time 8    Period Weeks    Status Achieved    Target Date 11/03/22      Additional Long Term Goals   Additional Long Term Goals Yes      PT LONG TERM GOAL #6   Title Pt will improved DGI score to > 19 points as a demonstration of improved balance.    Baseline DGI 17 points    Time 4    Period Weeks    Status New    Target Date 11/03/22              Plan - 10/17/22 1039     Clinical Impression Statement Worked on improving thoracic extension, trunk and glute strength to decrease stress to low back during standing tasks. Decreased low back pain with thoracic extension and trunk strengthening exercises. Pt tolerated session well without aggravation of symptoms. Pt will benefit from continued skilled physical therapy services to decrease pain, improve strength, balance and function.    Personal Factors and Comorbidities Age;Comorbidity 3+;Past/Current Experience;Time since onset of injury/illness/exacerbation    Comorbidities A-fib, arthritis, basal cell CA, chronic kidney disease, dysrhythmia, HTN, MI, phlebitis, L hip hemiarthroplasty    Examination-Activity Limitations Stairs;Stand;Bend;Locomotion Level;Transfers    Stability/Clinical Decision Making Stable/Uncomplicated    Rehab Potential Fair    PT Frequency 2x / week    PT Duration 8 weeks    PT Treatment/Interventions Therapeutic exercise;Therapeutic activities;Stair training;Functional mobility training;Balance training;Neuromuscular re-education;Patient/family education;Manual techniques;Dry needling;Aquatic Therapy;Electrical Stimulation;Iontophoresis 60m/ml Dexamethasone;Gait training    PT Next Visit Plan  posture, thoracic and hip extension, trunk and hip strengthening, manual techniques, modalities PRN    PT Home Exercise Plan Medbridge Access Code: [email or web address]   Consulted and Agree with Plan of Care Patient                           MJoneen BoersPT, DPT   10/17/2022, 11:16 AM

## 2022-10-20 ENCOUNTER — Ambulatory Visit: Payer: Medicare Other

## 2022-10-20 DIAGNOSIS — R2681 Unsteadiness on feet: Secondary | ICD-10-CM | POA: Diagnosis not present

## 2022-10-20 DIAGNOSIS — M5459 Other low back pain: Secondary | ICD-10-CM

## 2022-10-20 DIAGNOSIS — R262 Difficulty in walking, not elsewhere classified: Secondary | ICD-10-CM

## 2022-10-20 NOTE — Therapy (Signed)
OUTPATIENT PHYSICAL THERAPY TREATMENT NOTE    Patient Name: HALTON NEAS MRN: [id number] DOB:20-Nov-1941, 81 y.o., male Today's Date: 10/20/2022  PCP: Idelle Crouch, MD REFERRING PROVIDER: Idelle Crouch, MD   PT End of Session - 10/20/22 0957     Visit Number 25    Number of Visits 33    Date for PT Re-Evaluation 11/03/22    Authorization Type [employer] Medicare Primary; [employer] for [employer] sceondary    PT Start Time 1005    PT Stop Time 1047    PT Time Calculation (min) 42 min    Equipment Utilized During Treatment Gait belt    Activity Tolerance Patient tolerated treatment well    Behavior During Therapy WFL for tasks assessed/performed                                Past Medical History:  Diagnosis Date   A-fib ([place])    Arthritis    lower back   Basal cell carcinoma 10/29/2020   Right post base of skull/neck   BPH (benign prostatic hyperplasia)    Cancer (HCC)    Skin Cancer   Chronic kidney disease    Chronic Kidney Disease   Coronary artery disease    Dysrhythmia    Atrial Fibrillation; Supraventricular Tachycardia   Gout    Hearing aid worn    has, does not wear   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   L upper back 5.0cm inf to base of neck, 5.0cm lat to spine   Hyperlipidemia    Hypertension    MI, old    Nocturia    Phlebitis    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   SCC IS R foreearm distal   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   SCC IS R forearm proximal   Squamous cell carcinoma of skin 03/26/2018   SCC IS R forearm   Squamous cell carcinoma of skin 01/01/2008   SCC IS R forearm   Wears dentures    partial   Past Surgical History:  Procedure Laterality Date   cardaic stents     CARDIAC CATHETERIZATION     PTCA with Stent Placement   CARDIOVERSION N/A 04/13/2021   Procedure: CARDIOVERSION;  Surgeon: Corey Skains, MD;  Location: [facility];  Service: Cardiovascular;  Laterality: N/A;   CARDIOVERSION N/A  05/05/2021   Procedure: CARDIOVERSION;  Surgeon: Corey Skains, MD;  Location: [facility];  Service: Cardiovascular;  Laterality: N/A;   COLONOSCOPY WITH PROPOFOL N/A 12/15/2015   Procedure: COLONOSCOPY WITH PROPOFOL;  Surgeon: Lollie Sails, MD;  Location: [facility] END[facility];  Service: Endoscopy;  Laterality: N/A;   CORONARY ARTERY BYPASS GRAFT  1992   ECTROPION REPAIR Right 12/12/2017   Procedure: REPAIR OF ECTROPION SUTURES/EXTENSIVE RIGHT;  Surgeon: Karle Starch, MD;  Location: [place];  Service: Ophthalmology;  Laterality: Right;   HERNIA REPAIR     HIP ARTHROPLASTY Left 02/19/2022   Procedure: ARTHROPLASTY BIPOLAR HIP (HEMIARTHROPLASTY);  Surgeon: Thornton Park, MD;  Location: [facility];  Service: Orthopedics;  Laterality: Left;   IR KYPHO LUMBAR INC FX REDUCE BONE BX UNI/BIL CANNULATION INC/IMAGING  06/07/2022   IR RADIOLOGIST EVAL & MGMT  06/30/2022   SVT ABLATION  02/22/2017   [facility]   Patient Active Problem List   Diagnosis Date Noted   Acute urinary retention 02/19/2022   Acute delirium 02/18/2022   Dysphagia 02/18/2022  Hyponatremia 02/15/2022   Closed left hip fracture ([place]) 02/14/2022   Atrial fibrillation with RVR ([place]) 04/12/2021   Bradycardia 07/08/2019   Venous insufficiency of both lower extremities 07/08/2019   Abnormal glucose 01/10/2019   Elevated PSA 01/10/2019   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   Palpitations 06/13/2018   Leg edema, right 09/20/2017   History of BPH 02/21/2017   BPH (benign prostatic hyperplasia) 04/25/2016   Nocturia 04/25/2016   Gout 03/25/2016   History of colon polyps 03/25/2016   Inflammation of a vein 03/25/2016   Percutaneous transluminal coronary angioplasty status 03/25/2016   Supraventricular tachycardia 03/25/2016   Benign essential HTN 06/10/2015   Paroxysmal atrial fibrillation ([place]) 01/20/2015   Atrial fibrillation ([place]) 05/13/2014   Coronary artery disease 05/13/2014   Chest pain 05/13/2014   Stage 3a chronic kidney  disease (CKD) ([place]) 05/13/2014   Essential (primary) hypertension 05/13/2014   History of cardiac catheterization 05/13/2014   HLD (hyperlipidemia) 05/13/2014   Combined fat and carbohydrate induced hyperlipemia 05/13/2014   History of cardiovascular surgery 05/13/2014   History of anticoagulant therapy 04/28/2014   Warfarin anticoagulation 04/28/2014   Benign prostatic hyperplasia with urinary obstruction 11/05/2013    REFERRING DIAG: LBP  THERAPY DIAG:  Unsteadiness on feet  Other low back pain  Difficulty in walking, not elsewhere classified  Rationale for Evaluation and Treatment Rehabilitation  PERTINENT HISTORY: LBP, balance. Pt fell 02/17/2022 while trying to step up onto his half step to get into his home in the garage. Pt tripped on the rug which is no longer there. Pt fractured L hip S/P L hip hemiarthroplasty on 02/19/2022. Pt underwent home health PT for the hip rehab. Pt fell again a couple of weeks after participating in rehab resulting in lumbar fracture. Does not remember what caused the fall. Pt was doing something in the hall. Pt does not remember if he was using an AD. Pt had a shot in his back which took away the pain. Pt started using his SPC a couple of weeks ago. Home health PT ended last week. Independent ambulation prior to L hip fracture.  PRECAUTIONS: fall risk,   SUBJECTIVE: Back is a little sore. 2/10 soreness currently. Standing up for a long time, about 10 minutes increases low back soreness.  PAIN:  Are you having pain? 2/10      TODAY'S TREATMENT:   Therapeutic exerise   Sitting with upright posture:   Transversus abdominis contraction 10x5 seconds for 3 sets    Decreased low back soreness reported  Standing low rows red band with B scapular retraction 10x3 with 5 second holds  Sitting with upright posture  Gentle manual trunk perturbation all planes 1 minute x 2   Standing hip abduction with B UE assist   Yellow band around ankles    R  10x3   L 10x3  Standing hip extension with B UE assist   Yellow band around ankles    R 10x3   L 10x3  Forward step up onto an over Air Ex pad with contralateral UE light touch assist PRN  R 10x  L 10x   Gait from gym to car with and without SPC, as well as using SPC to step down a curb properly.   Improved exercise technique, movement at target joints, use of target muscles after mod verbal, visual, tactile cues.         Response to treatment Pt tolerated session well without aggravation of symptoms.      Clinical impression  Worked on upright posture, thoracic extension and trunk strength to decrease stress to low back. Decreased low back soreness reported after seated trunk strengthening exercises. Continued working on glute med and max strength to promote single leg stance balance with stepping over obstacles to decrease fall risk.  Pt tolerated session well without aggravation of symptoms. Pt will benefit from continued skilled physical therapy services to decrease pain, improve strength, balance and function.         PATIENT EDUCATION: Education details: there-ex, HEP Person educated: Patient Education method: Explanation, Demonstration, Tactile cues, Verbal cues, and Handouts Education comprehension: verbalized understanding and returned demonstration   HOME EXERCISE PROGRAM:       Access Code: [email or web address] URL: [email or web address] Date: 07/05/2022 Prepared by: Joneen Boers    Exercises - Standing Hip Abduction with Counter Support  - 2 x daily - 7 x weekly - 1 sets - 10 reps - 5 seconds hold - Sit to Stand with Armchair  - 1 x daily - 3 x weekly - 2 sets - 8 reps - Scapular Retraction with Resistance  - 1 x daily - 3 x weekly - 3 sets - 12 reps The HEP added last session (07/19/2022) per pt: sit <> stand, stepping over obstacles, walking forward and back - Shoulder extension with resistance - Neutral  - 1 x daily - 7 x weekly - 1 sets - 10  reps - 5 seconds hold (RED BAND)   PT Short Term Goals - 08/10/22 1019       PT SHORT TERM GOAL #1   Title Pt will be independent with his initial HEP to decrease pain, improve strength, balance, and function.    Baseline Pt has started his initial HEP (07/05/2022); 08/10/2022 reports independence compelting as prescribed.    Time 3    Period Weeks    Status Achieved    Target Date 08/10/22              PT Long Term Goals - 10/04/22 0945       PT LONG TERM GOAL #1   Title Patient will have a decrease in low back pain to 4/10 or less at worst to promote ability to perform standing tasks, ambulate, and negotiate stairs with less difficulty.    Baseline 10/10 low back pain at worst for the past 3 months (07/05/2022); denies back pain with standing, walking or stair negotiation; 4/10 at worst for the past 7 days (09/05/2022)    Time 8    Period Weeks    Status Achieved    Target Date 08/10/22      PT LONG TERM GOAL #2   Title Patient will improve his lumbar FOTO score by at least 10 points as a demonstration of improved function.    Baseline Lumbar spine FOTO 40 (07/05/2022); 52    Time 8    Period Weeks    Status Achieved    Target Date 08/10/22      PT LONG TERM GOAL #3   Title Pt will improve his TUG score to 12 seconds or less without SPC as a demonstration of improved balance and functional mobility.    Baseline 18.33 seconds average with SPC on R side (07/05/2022); 22.05 sec  with SPC on RUE; 09/05/2022); 15 seconds average with SPC (10/04/2022)    Time 8    Period Weeks    Status Partially Met    Target Date 11/03/22      PT LONG TERM GOAL #4  Title Pt will improve bilateral bilateral hip extension and abduction to promote ability to perform standing tasks more steadily.    Baseline Hip extension 4+/5 R and L, hip abduction 4+/5 R, 4/5 L (07/05/2022); deferred for primary PT; hip extension 5/5 R and L, hip abduction 5/5 R and L (09/05/2022)    Time 8    Period Weeks     Status Achieved    Target Date 09/01/22      PT LONG TERM GOAL #5   Title Pt will improve 5xSTS without UE support to 14.8 sec or less to demonstrate clinically significant improvement for age matched norms for LE strength and reduced risk of falls.    Baseline 08/10/22: 27.29 with UE's on thighs standard height chair; B UE assist 14.18 seconds (09/05/2022); 13.50 seconds (10/04/2022)    Time 8    Period Weeks    Status Achieved    Target Date 11/03/22      Additional Long Term Goals   Additional Long Term Goals Yes      PT LONG TERM GOAL #6   Title Pt will improved DGI score to > 19 points as a demonstration of improved balance.    Baseline DGI 17 points    Time 4    Period Weeks    Status New    Target Date 11/03/22              Plan - 10/20/22 0956     Clinical Impression Statement Worked on upright posture, thoracic extension and trunk strength to decrease stress to low back. Decreased low back soreness reported after seated trunk strengthening exercises. Continued working on glute med and max strength to promote single leg stance balance with stepping over obstacles to decrease fall risk.  Pt tolerated session well without aggravation of symptoms. Pt will benefit from continued skilled physical therapy services to decrease pain, improve strength, balance and function.    Personal Factors and Comorbidities Age;Comorbidity 3+;Past/Current Experience;Time since onset of injury/illness/exacerbation    Comorbidities A-fib, arthritis, basal cell CA, chronic kidney disease, dysrhythmia, HTN, MI, phlebitis, L hip hemiarthroplasty    Examination-Activity Limitations Stairs;Stand;Bend;Locomotion Level;Transfers    Stability/Clinical Decision Making Stable/Uncomplicated    Clinical Decision Making Low    Rehab Potential Fair    PT Frequency 2x / week    PT Duration 8 weeks    PT Treatment/Interventions Therapeutic exercise;Therapeutic activities;Stair training;Functional mobility  training;Balance training;Neuromuscular re-education;Patient/family education;Manual techniques;Dry needling;Aquatic Therapy;Electrical Stimulation;Iontophoresis 59m/ml Dexamethasone;Gait training    PT Next Visit Plan posture, thoracic and hip extension, trunk and hip strengthening, manual techniques, modalities PRN    PT Home Exercise Plan Medbridge Access Code: [email or web address]   Consulted and Agree with Plan of Care Patient                            MJoneen BoersPT, DPT   10/20/2022, 10:58 AM

## 2022-10-24 ENCOUNTER — Ambulatory Visit: Payer: Medicare Other

## 2022-10-24 DIAGNOSIS — R2681 Unsteadiness on feet: Secondary | ICD-10-CM | POA: Diagnosis not present

## 2022-10-24 DIAGNOSIS — R262 Difficulty in walking, not elsewhere classified: Secondary | ICD-10-CM

## 2022-10-24 DIAGNOSIS — M5459 Other low back pain: Secondary | ICD-10-CM

## 2022-10-24 NOTE — Therapy (Signed)
OUTPATIENT PHYSICAL THERAPY TREATMENT NOTE    Patient Name: Robert Rivas MRN: [id number] DOB:[birth year], 81 y.o., male Today's Date: 10/24/2022  PCP: Idelle Crouch, MD REFERRING PROVIDER: Idelle Crouch, MD   PT End of Session - 10/24/22 1053     Visit Number 26    Number of Visits 31    Date for PT Re-Evaluation 11/03/22    Authorization Type UHC Medicare Primary; Tricare for Life sceondary    PT Start Time 1054    PT Stop Time 1135    PT Time Calculation (min) 41 min    Equipment Utilized During Treatment Gait belt    Activity Tolerance Patient tolerated treatment well    Behavior During Therapy WFL for tasks assessed/performed                                 Past Medical History:  Diagnosis Date   A-fib ([place])    Arthritis    lower back   Basal cell carcinoma 10/29/2020   Right post base of skull/neck   BPH (benign prostatic hyperplasia)    Cancer (HCC)    Skin Cancer   Chronic kidney disease    Chronic Kidney Disease   Coronary artery disease    Dysrhythmia    Atrial Fibrillation; Supraventricular Tachycardia   Gout    Hearing aid worn    has, does not wear   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   L upper back 5.0cm inf to base of neck, 5.0cm lat to spine   Hyperlipidemia    Hypertension    MI, old    Nocturia    Phlebitis    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   SCC IS R foreearm distal   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   SCC IS R forearm proximal   Squamous cell carcinoma of skin 03/26/2018   SCC IS R forearm   Squamous cell carcinoma of skin 01/01/2008   SCC IS R forearm   Wears dentures    partial   Past Surgical History:  Procedure Laterality Date   cardaic stents     CARDIAC CATHETERIZATION     PTCA with Stent Placement   CARDIOVERSION N/A 04/13/2021   Procedure: CARDIOVERSION;  Surgeon: Corey Skains, MD;  Location: [facility];  Service: Cardiovascular;  Laterality: N/A;   CARDIOVERSION N/A  05/05/2021   Procedure: CARDIOVERSION;  Surgeon: Corey Skains, MD;  Location: [facility];  Service: Cardiovascular;  Laterality: N/A;   COLONOSCOPY WITH PROPOFOL N/A 12/15/2015   Procedure: COLONOSCOPY WITH PROPOFOL;  Surgeon: Lollie Sails, MD;  Location: [facility] ENDOSCOPY;  Service: Endoscopy;  Laterality: N/A;   CORONARY ARTERY BYPASS GRAFT  1992   ECTROPION REPAIR Right 12/12/2017   Procedure: REPAIR OF ECTROPION SUTURES/EXTENSIVE RIGHT;  Surgeon: Karle Starch, MD;  Location: [place];  Service: Ophthalmology;  Laterality: Right;   HERNIA REPAIR     HIP ARTHROPLASTY Left 02/19/2022   Procedure: ARTHROPLASTY BIPOLAR HIP (HEMIARTHROPLASTY);  Surgeon: Thornton Park, MD;  Location: [facility];  Service: Orthopedics;  Laterality: Left;   IR KYPHO LUMBAR INC FX REDUCE BONE BX UNI/BIL CANNULATION INC/IMAGING  06/07/2022   IR RADIOLOGIST EVAL & MGMT  06/30/2022   SVT ABLATION  02/22/2017   [facility]   Patient Active Problem List   Diagnosis Date Noted   Acute urinary retention 02/19/2022   Acute delirium 02/18/2022   Dysphagia 02/18/2022  Hyponatremia 02/15/2022   Closed left hip fracture ([place]) 02/14/2022   Atrial fibrillation with RVR ([place]) 04/12/2021   Bradycardia 07/08/2019   Venous insufficiency of both lower extremities 07/08/2019   Abnormal glucose 01/10/2019   Elevated PSA 01/10/2019   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   Palpitations 06/13/2018   Leg edema, right 09/20/2017   History of BPH 02/21/2017   BPH (benign prostatic hyperplasia) 04/25/2016   Nocturia 04/25/2016   Gout 03/25/2016   History of colon polyps 03/25/2016   Inflammation of a vein 03/25/2016   Percutaneous transluminal coronary angioplasty status 03/25/2016   Supraventricular tachycardia 03/25/2016   Benign essential HTN 06/10/2015   Paroxysmal atrial fibrillation ([place]) 01/20/2015   Atrial fibrillation ([place]) 05/13/2014   Coronary artery disease 05/13/2014   Chest pain 05/13/2014   Stage 3a chronic kidney  disease (CKD) ([place]) 05/13/2014   Essential (primary) hypertension 05/13/2014   History of cardiac catheterization 05/13/2014   HLD (hyperlipidemia) 05/13/2014   Combined fat and carbohydrate induced hyperlipemia 05/13/2014   History of cardiovascular surgery 05/13/2014   History of anticoagulant therapy 04/28/2014   Warfarin anticoagulation 04/28/2014   Benign prostatic hyperplasia with urinary obstruction 11/05/2013    REFERRING DIAG: LBP  THERAPY DIAG:  Unsteadiness on feet  Other low back pain  Difficulty in walking, not elsewhere classified  Rationale for Evaluation and Treatment Rehabilitation  PERTINENT HISTORY: LBP, balance. Pt fell 02/17/2022 while trying to step up onto his half step to get into his home in the garage. Pt tripped on the rug which is no longer there. Pt fractured L hip S/P L hip hemiarthroplasty on 02/19/2022. Pt underwent home health PT for the hip rehab. Pt fell again a couple of weeks after participating in rehab resulting in lumbar fracture. Does not remember what caused the fall. Pt was doing something in the hall. Pt does not remember if he was using an AD. Pt had a shot in his back which took away the pain. Pt started using his SPC a couple of weeks ago. Home health PT ended last week. Independent ambulation prior to L hip fracture.  PRECAUTIONS: fall risk,   SUBJECTIVE: Needs to leave by 11:30 am today. Back is good this morning. Back was good after last session.   PAIN:  Are you having pain? 0/10    TODAY'S TREATMENT:   Therapeutic exerise   Adjusted pt NBQC to proper height and position   Feels better for pt.  Gait with pt NBQC 200 ft  Stepping over 5 mini hurdles 8x, CGA to min A. Cues to increase hip and knee flexion and step length for foot clearance. Pt tripped on a hurdle 2x (once each foot), min A to recover.   Gait with 3 lbs ankle weight to promote strength for foot clearance 170 ft. CGA  Standing hip abduction with B UE assist    Yellow band around ankles    R 10x3   L 10x3  Standing hip extension with B UE assist   Yellow band around ankles    R 10x3   L 10x3  Forward step up onto an over Air Ex pad with contralateral UE light touch assist PRN  R 10x  L 10x  Sitting with upright posture:   Transversus abdominis contraction 10x5 seconds for 3 sets     Sitting with upright posture  Gentle manual trunk perturbation all planes 1 minute   Gait from gym to car with and without NBQC, as well as using NBQC to step  down a curb properly.   Improved exercise technique, movement at target joints, use of target muscles after mod verbal, visual, tactile cues.        Response to treatment Pt tolerated session well without aggravation of symptoms.      Clinical impression  Pt arrived to PT with a NBQC provided by the [place]. Per pt, he feels more secure with it. Continued working on improving trunk and glute strength to decrease stress to low back as well as improve single leg balance strength for obstacle negotiation. Cues needed to improve hip and knee flexion and step length to clear mini hurdles today.  Pt tolerated session well without aggravation of symptoms. Pt will benefit from continued skilled physical therapy services to decrease pain, improve strength, balance and function.         PATIENT EDUCATION: Education details: there-ex, HEP Person educated: Patient Education method: Explanation, Demonstration, Tactile cues, Verbal cues, and Handouts Education comprehension: verbalized understanding and returned demonstration   HOME EXERCISE PROGRAM:       Access Code: [email or web address] URL: [email or web address] Date: 07/05/2022 Prepared by: Joneen Boers    Exercises - Standing Hip Abduction with Counter Support  - 2 x daily - 7 x weekly - 1 sets - 10 reps - 5 seconds hold - Sit to Stand with Armchair  - 1 x daily - 3 x weekly - 2 sets - 8 reps - Scapular Retraction with Resistance  - 1 x  daily - 3 x weekly - 3 sets - 12 reps The HEP added last session (07/19/2022) per pt: sit <> stand, stepping over obstacles, walking forward and back - Shoulder extension with resistance - Neutral  - 1 x daily - 7 x weekly - 1 sets - 10 reps - 5 seconds hold (RED BAND)   PT Short Term Goals - 08/10/22 1019       PT SHORT TERM GOAL #1   Title Pt will be independent with his initial HEP to decrease pain, improve strength, balance, and function.    Baseline Pt has started his initial HEP (07/05/2022); 08/10/2022 reports independence compelting as prescribed.    Time 3    Period Weeks    Status Achieved    Target Date 08/10/22              PT Long Term Goals - 10/04/22 0945       PT LONG TERM GOAL #1   Title Patient will have a decrease in low back pain to 4/10 or less at worst to promote ability to perform standing tasks, ambulate, and negotiate stairs with less difficulty.    Baseline 10/10 low back pain at worst for the past 3 months (07/05/2022); denies back pain with standing, walking or stair negotiation; 4/10 at worst for the past 7 days (09/05/2022)    Time 8    Period Weeks    Status Achieved    Target Date 08/10/22      PT LONG TERM GOAL #2   Title Patient will improve his lumbar FOTO score by at least 10 points as a demonstration of improved function.    Baseline Lumbar spine FOTO 40 (07/05/2022); 52    Time 8    Period Weeks    Status Achieved    Target Date 08/10/22      PT LONG TERM GOAL #3   Title Pt will improve his TUG score to 12 seconds or less without SPC as a demonstration of improved balance and functional  mobility.    Baseline 18.33 seconds average with SPC on R side (07/05/2022); 22.05 sec  with SPC on RUE; 09/05/2022); 15 seconds average with SPC (10/04/2022)    Time 8    Period Weeks    Status Partially Met    Target Date 11/03/22      PT LONG TERM GOAL #4   Title Pt will improve bilateral bilateral hip extension and abduction to promote ability to  perform standing tasks more steadily.    Baseline Hip extension 4+/5 R and L, hip abduction 4+/5 R, 4/5 L (07/05/2022); deferred for primary PT; hip extension 5/5 R and L, hip abduction 5/5 R and L (09/05/2022)    Time 8    Period Weeks    Status Achieved    Target Date 09/01/22      PT LONG TERM GOAL #5   Title Pt will improve 5xSTS without UE support to 14.8 sec or less to demonstrate clinically significant improvement for age matched norms for LE strength and reduced risk of falls.    Baseline 08/10/22: 27.29 with UE's on thighs standard height chair; B UE assist 14.18 seconds (09/05/2022); 13.50 seconds (10/04/2022)    Time 8    Period Weeks    Status Achieved    Target Date 11/03/22      Additional Long Term Goals   Additional Long Term Goals Yes      PT LONG TERM GOAL #6   Title Pt will improved DGI score to > 19 points as a demonstration of improved balance.    Baseline DGI 17 points    Time 4    Period Weeks    Status New    Target Date 11/03/22              Plan - 10/24/22 1051     Clinical Impression Statement Pt arrived to PT with a NBQC provided by the [place]. Per pt, he feels more secure with it. Continued working on improving trunk and glute strength to decrease stress to low back as well as improve single leg balance strength for obstacle negotiation. Cues needed to improve hip and knee flexion and step length to clear mini hurdles today.  Pt tolerated session well without aggravation of symptoms. Pt will benefit from continued skilled physical therapy services to decrease pain, improve strength, balance and function.    Personal Factors and Comorbidities Age;Comorbidity 3+;Past/Current Experience;Time since onset of injury/illness/exacerbation    Comorbidities A-fib, arthritis, basal cell CA, chronic kidney disease, dysrhythmia, HTN, MI, phlebitis, L hip hemiarthroplasty    Examination-Activity Limitations Stairs;Stand;Bend;Locomotion Level;Transfers     Stability/Clinical Decision Making Stable/Uncomplicated    Clinical Decision Making Low    Rehab Potential Fair    PT Frequency 2x / week    PT Duration 8 weeks    PT Treatment/Interventions Therapeutic exercise;Therapeutic activities;Stair training;Functional mobility training;Balance training;Neuromuscular re-education;Patient/family education;Manual techniques;Dry needling;Aquatic Therapy;Electrical Stimulation;Iontophoresis 54m/ml Dexamethasone;Gait training    PT Next Visit Plan posture, thoracic and hip extension, trunk and hip strengthening, manual techniques, modalities PRN    PT Home Exercise Plan Medbridge Access Code: [email or web address]   Consulted and Agree with Plan of Care Patient                             MJoneen BoersPT, DPT   10/24/2022, 12:54 PM

## 2022-10-27 ENCOUNTER — Ambulatory Visit: Payer: Medicare Other | Attending: Internal Medicine

## 2022-10-27 DIAGNOSIS — R262 Difficulty in walking, not elsewhere classified: Secondary | ICD-10-CM | POA: Diagnosis present

## 2022-10-27 DIAGNOSIS — R2681 Unsteadiness on feet: Secondary | ICD-10-CM | POA: Insufficient documentation

## 2022-10-27 DIAGNOSIS — M5459 Other low back pain: Secondary | ICD-10-CM | POA: Diagnosis present

## 2022-10-27 NOTE — Therapy (Signed)
OUTPATIENT PHYSICAL THERAPY TREATMENT NOTE    Patient Name: Robert Rivas MRN: [id number] DOB:October 11, 1941, 81 y.o., male Today's Date: 10/27/2022  PCP: Idelle Crouch, MD REFERRING PROVIDER: Idelle Crouch, MD   PT End of Session - 10/27/22 1101     Visit Number 27    Number of Visits 102    Date for PT Re-Evaluation 11/03/22    Authorization Type UHC Medicare Primary; Tricare for Life sceondary    PT Start Time 1100    PT Stop Time 1143    PT Time Calculation (min) 43 min    Equipment Utilized During Treatment Gait belt    Activity Tolerance Patient tolerated treatment well    Behavior During Therapy WFL for tasks assessed/performed                                 Past Medical History:  Diagnosis Date   A-fib ([place])    Arthritis    lower back   Basal cell carcinoma 10/29/2020   Right post base of skull/neck   BPH (benign prostatic hyperplasia)    Cancer (HCC)    Skin Cancer   Chronic kidney disease    Chronic Kidney Disease   Coronary artery disease    Dysrhythmia    Atrial Fibrillation; Supraventricular Tachycardia   Gout    Hearing aid worn    has, does not wear   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   L upper back 5.0cm inf to base of neck, 5.0cm lat to spine   Hyperlipidemia    Hypertension    MI, old    Nocturia    Phlebitis    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   SCC IS R foreearm distal   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   SCC IS R forearm proximal   Squamous cell carcinoma of skin 03/26/2018   SCC IS R forearm   Squamous cell carcinoma of skin 01/01/2008   SCC IS R forearm   Wears dentures    partial   Past Surgical History:  Procedure Laterality Date   cardaic stents     CARDIAC CATHETERIZATION     PTCA with Stent Placement   CARDIOVERSION N/A 04/13/2021   Procedure: CARDIOVERSION;  Surgeon: Corey Skains, MD;  Location: [facility] ORS;  Service: Cardiovascular;  Laterality: N/A;   CARDIOVERSION N/A  05/05/2021   Procedure: CARDIOVERSION;  Surgeon: Corey Skains, MD;  Location: [facility] ORS;  Service: Cardiovascular;  Laterality: N/A;   COLONOSCOPY WITH PROPOFOL N/A 12/15/2015   Procedure: COLONOSCOPY WITH PROPOFOL;  Surgeon: Lollie Sails, MD;  [facility] ENDOSCOPY;  Service: Endoscopy;  Laterality: N/A;   CORONARY ARTERY BYPASS GRAFT  1992   ECTROPION REPAIR Right 12/12/2017   Procedure: REPAIR OF ECTROPION SUTURES/EXTENSIVE RIGHT;  Surgeon: Karle Starch, MD;  Location: [place];  Service: Ophthalmology;  Laterality: Right;   HERNIA REPAIR     HIP ARTHROPLASTY Left 02/19/2022   Procedure: ARTHROPLASTY BIPOLAR HIP (HEMIARTHROPLASTY);  Surgeon: Thornton Park, MD;  Location: [facility] ORS;  Service: Orthopedics;  Laterality: Left;   IR KYPHO LUMBAR INC FX REDUCE BONE BX UNI/BIL CANNULATION INC/IMAGING  06/07/2022   IR RADIOLOGIST EVAL & MGMT  06/30/2022   SVT ABLATION  02/22/2017   [facility]   Patient Active Problem List   Diagnosis Date Noted   Acute urinary retention 02/19/2022   Acute delirium 02/18/2022   Dysphagia 02/18/2022  Hyponatremia 02/15/2022   Closed left hip fracture ([place]) 02/14/2022   Atrial fibrillation with RVR ([place]) 04/12/2021   Bradycardia 07/08/2019   Venous insufficiency of both lower extremities 07/08/2019   Abnormal glucose 01/10/2019   Elevated PSA 01/10/2019   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) 05/13/2014   Coronary artery disease 05/13/2014   Chest pain 05/13/2014   Stage 3a chronic kidney  disease (CKD) ([place]) 05/13/2014   Essential (primary) hypertension 05/13/2014   History of cardiac catheterization 05/13/2014   HLD (hyperlipidemia) 05/13/2014   Combined fat and carbohydrate induced hyperlipemia 05/13/2014   History of cardiovascular surgery 05/13/2014   History of anticoagulant therapy 04/28/2014   Warfarin anticoagulation 04/28/2014   Benign prostatic hyperplasia with urinary obstruction 11/05/2013    REFERRING DIAG: LBP  THERAPY DIAG:  Unsteadiness on feet  Other low back pain  Difficulty in walking, not elsewhere classified  Rationale for Evaluation and Treatment Rehabilitation  PERTINENT HISTORY: LBP, balance. Pt fell 02/17/2022 while trying to step up onto his half step to get into his home in the garage. Pt tripped on the rug which is no longer there. Pt fractured L hip S/P L hip hemiarthroplasty on 02/19/2022. Pt underwent home health PT for the hip rehab. Pt fell again a couple of weeks after participating in rehab resulting in lumbar fracture. Does not remember what caused the fall. Pt was doing something in the hall. Pt does not remember if he was using an AD. Pt had a shot in his back which took away the pain. Pt started using his SPC a couple of weeks ago. Home health PT ended last week. Independent ambulation prior to L hip fracture.  PRECAUTIONS: fall risk,   SUBJECTIVE: No LBP. Reports getting stronger, happy with new NBQC and also feels more balanced.  PAIN:  Are you having pain? 0/10    TODAY'S TREATMENT:   Neuro Re-Ed:   Gait with 3# AW's donned. 200' with NBQC with focus on foot clearance and step through pattern. Additional 200' without NBQC.   Stepping over x5 hurdles with 3# AW's. X4 laps d/b CGA. Good foot clearance with absence of tripping or knocking over hurdles  Stepping over x5 hurdles with 3# AW's. X4 laps d/b CGA. Performed step through pattern compared to step to. VC's for NBQC positioning with LLE steps. Good carryover. Some  intermittent Stepping on hurdles.   Obstacle course: amb over unstable mat to alternating cone taps x4 cones. X4 d/b without NBQC. Difficulty with cone taps due to limited SLS time. Intermittent finger support on wall with VC's to remove. SLS time improves with repetition. CGA.   Alternating 6" step ups with contralateral LE hip flexion. Light finger support on stair rails. X12/LE. SBA.    Response to treatment Pt tolerated session well without aggravation of symptoms.      Clinical impression Continuing PT POC with focus on LE strengthening and gait mechanics. Utilization of AW's for proprioceptive feedback for hip flexion for foot clearance and use of visual targets for improved carryover. With removal of visual targets pt seems  to revert back to decreased step lengths and foot height but able to correct with VC's throughout session. Pt overall seems most limited in SLS time with dynamic balance activities. Pt aware of remaining deficits and aware approaching end of current POC. Will need to maximize remaining deficits before upcoming RECERT/Discharge date oh 11/03/2022.         PATIENT EDUCATION: Education details: there-ex, HEP Person educated: Patient Education method: Explanation, Demonstration, Tactile cues, Verbal cues, and Handouts Education comprehension: verbalized understanding and returned demonstration   HOME EXERCISE PROGRAM:       Access Code: [email or web address] URL: [email or web address] Date: 07/05/2022 Prepared by: Joneen Boers    Exercises - Standing Hip Abduction with Counter Support  - 2 x daily - 7 x weekly - 1 sets - 10 reps - 5 seconds hold - Sit to Stand with Armchair  - 1 x daily - 3 x weekly - 2 sets - 8 reps - Scapular Retraction with Resistance  - 1 x daily - 3 x weekly - 3 sets - 12 reps The HEP added last session (07/19/2022) per pt: sit <> stand, stepping over obstacles, walking forward and back - Shoulder extension with resistance -  Neutral  - 1 x daily - 7 x weekly - 1 sets - 10 reps - 5 seconds hold (RED BAND)   PT Short Term Goals - 08/10/22 1019       PT SHORT TERM GOAL #1   Title Pt will be independent with his initial HEP to decrease pain, improve strength, balance, and function.    Baseline Pt has started his initial HEP (07/05/2022); 08/10/2022 reports independence compelting as prescribed.    Time 3    Period Weeks    Status Achieved    Target Date 08/10/22              PT Long Term Goals - 10/04/22 0945       PT LONG TERM GOAL #1   Title Patient will have a decrease in low back pain to 4/10 or less at worst to promote ability to perform standing tasks, ambulate, and negotiate stairs with less difficulty.    Baseline 10/10 low back pain at worst for the past 3 months (07/05/2022); denies back pain with standing, walking or stair negotiation; 4/10 at worst for the past 7 days (09/05/2022)    Time 8    Period Weeks    Status Achieved    Target Date 08/10/22      PT LONG TERM GOAL #2   Title Patient will improve his lumbar FOTO score by at least 10 points as a demonstration of improved function.    Baseline Lumbar spine FOTO 40 (07/05/2022); 52    Time 8    Period Weeks    Status Achieved    Target Date 08/10/22      PT LONG TERM GOAL #3   Title Pt will improve his TUG score to 12 seconds or less without SPC as a demonstration of improved balance and functional mobility.    Baseline 18.33 seconds average with SPC on R side (07/05/2022); 22.05 sec  with SPC on RUE; 09/05/2022); 15 seconds average with SPC (10/04/2022)    Time 8    Period Weeks    Status Partially Met    Target Date 11/03/22      PT LONG TERM GOAL #4   Title Pt will improve bilateral bilateral hip extension and abduction to promote ability to perform standing tasks more steadily.  Baseline Hip extension 4+/5 R and L, hip abduction 4+/5 R, 4/5 L (07/05/2022); deferred for primary PT; hip extension 5/5 R and L, hip abduction 5/5 R and  L (09/05/2022)    Time 8    Period Weeks    Status Achieved    Target Date 09/01/22      PT LONG TERM GOAL #5   Title Pt will improve 5xSTS without UE support to 14.8 sec or less to demonstrate clinically significant improvement for age matched norms for LE strength and reduced risk of falls.    Baseline 08/10/22: 27.29 with UE's on thighs standard height chair; B UE assist 14.18 seconds (09/05/2022); 13.50 seconds (10/04/2022)    Time 8    Period Weeks    Status Achieved    Target Date 11/03/22      Additional Long Term Goals   Additional Long Term Goals Yes      PT LONG TERM GOAL #6   Title Pt will improved DGI score to > 19 points as a demonstration of improved balance.    Baseline DGI 17 points    Time 4    Period Weeks    Status New    Target Date 11/03/22                 Salem Caster. Fairly IV, PT, DPT Physical Therapist- [facility]  10/27/2022, 11:47 AM

## 2022-10-31 ENCOUNTER — Ambulatory Visit: Payer: Medicare Other

## 2022-10-31 DIAGNOSIS — R262 Difficulty in walking, not elsewhere classified: Secondary | ICD-10-CM

## 2022-10-31 DIAGNOSIS — R2681 Unsteadiness on feet: Secondary | ICD-10-CM | POA: Diagnosis not present

## 2022-10-31 DIAGNOSIS — M5459 Other low back pain: Secondary | ICD-10-CM

## 2022-10-31 NOTE — Therapy (Signed)
OUTPATIENT PHYSICAL THERAPY TREATMENT NOTE    Patient Name: Robert Rivas MRN: [id number] DOB:1941/01/09, 81 y.o., male Today's Date: 10/31/2022  PCP: Idelle Crouch, MD REFERRING PROVIDER: Idelle Crouch, MD   PT End of Session - 10/31/22 1051     Visit Number 28    Number of Visits 33    Date for PT Re-Evaluation 11/03/22    Authorization Type [employer] Medicare Primary; [employer] for [employer] sceondary    PT Start Time 1051    PT Stop Time 1132    PT Time Calculation (min) 41 min    Equipment Utilized During Treatment Gait belt    Activity Tolerance Patient tolerated treatment well    Behavior During Therapy WFL for tasks assessed/performed                                  Past Medical History:  Diagnosis Date   A-fib ([place])    Arthritis    lower back   Basal cell carcinoma 10/29/2020   Right post base of skull/neck   BPH (benign prostatic hyperplasia)    Cancer (HCC)    Skin Cancer   Chronic kidney disease    Chronic Kidney Disease   Coronary artery disease    Dysrhythmia    Atrial Fibrillation; Supraventricular Tachycardia   Gout    Hearing aid worn    has, does not wear   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   L upper back 5.0cm inf to base of neck, 5.0cm lat to spine   Hyperlipidemia    Hypertension    MI, old    Nocturia    Phlebitis    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   SCC IS R foreearm distal   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   SCC IS R forearm proximal   Squamous cell carcinoma of skin 03/26/2018   SCC IS R forearm   Squamous cell carcinoma of skin 01/01/2008   SCC IS R forearm   Wears dentures    partial   Past Surgical History:  Procedure Laterality Date   cardaic stents     CARDIAC CATHETERIZATION     PTCA with Stent Placement   CARDIOVERSION N/A 04/13/2021   Procedure: CARDIOVERSION;  Surgeon: Corey Skains, MD;  Location: [facility];  Service: Cardiovascular;  Laterality: N/A;   CARDIOVERSION  N/A 05/05/2021   Procedure: CARDIOVERSION;  Surgeon: Corey Skains, MD;  Location: [facility];  Service: Cardiovascular;  Laterality: N/A;   COLONOSCOPY WITH PROPOFOL N/A 12/15/2015   Procedure: COLONOSCOPY WITH PROPOFOL;  Surgeon: Lollie Sails, MD;  Location: [facility] ENDOSCOPY;  Service: Endoscopy;  Laterality: N/A;   CORONARY ARTERY BYPASS GRAFT  1992   ECTROPION REPAIR Right 12/12/2017   Procedure: REPAIR OF ECTROPION SUTURES/EXTENSIVE RIGHT;  Surgeon: Karle Starch, MD;  Location: [place];  Service: Ophthalmology;  Laterality: Right;   HERNIA REPAIR     HIP ARTHROPLASTY Left 02/19/2022   Procedure: ARTHROPLASTY BIPOLAR HIP (HEMIARTHROPLASTY);  Surgeon: Thornton Park, MD;  Location: [facility];  Service: Orthopedics;  Laterality: Left;   IR KYPHO LUMBAR INC FX REDUCE BONE BX UNI/BIL CANNULATION INC/IMAGING  06/07/2022   IR RADIOLOGIST EVAL & MGMT  06/30/2022   SVT ABLATION  02/22/2017   [facility]   Patient Active Problem List   Diagnosis Date Noted   Acute urinary retention 02/19/2022   Acute delirium 02/18/2022   Dysphagia  02/18/2022   Hyponatremia 02/15/2022   Closed left hip fracture (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   Palpitations 06/13/2018   Leg edema, right 09/20/2017   History of BPH 02/21/2017   BPH (benign prostatic hyperplasia) 04/25/2016   Nocturia 04/25/2016   Gout 03/25/2016   History of colon polyps 03/25/2016   Inflammation of a vein 03/25/2016   Percutaneous transluminal coronary angioplasty status 03/25/2016   Supraventricular tachycardia 03/25/2016   Benign essential HTN 06/10/2015   Paroxysmal atrial fibrillation ([place]) 01/20/2015   Atrial fibrillation ([place]) 05/13/2014   Coronary artery disease 05/13/2014   Chest pain 05/13/2014   Stage 3a chronic  kidney disease (CKD) ([place]) 05/13/2014   Essential (primary) hypertension 05/13/2014   History of cardiac catheterization 05/13/2014   HLD (hyperlipidemia) 05/13/2014   Combined fat and carbohydrate induced hyperlipemia 05/13/2014   History of cardiovascular surgery 05/13/2014   History of anticoagulant therapy 04/28/2014   Warfarin anticoagulation 04/28/2014   Benign prostatic hyperplasia with urinary obstruction 11/05/2013    REFERRING DIAG: LBP  THERAPY DIAG:  Unsteadiness on feet  Other low back pain  Difficulty in walking, not elsewhere classified  Rationale for Evaluation and Treatment Rehabilitation  PERTINENT HISTORY: LBP, balance. Pt fell 02/17/2022 while trying to step up onto his half step to get into his home in the garage. Pt tripped on the rug which is no longer there. Pt fractured L hip S/P L hip hemiarthroplasty on 02/19/2022. Pt underwent home health PT for the hip rehab. Pt fell again a couple of weeks after participating in rehab resulting in lumbar fracture. Does not remember what caused the fall. Pt was doing something in the hall. Pt does not remember if he was using an AD. Pt had a shot in his back which took away the pain. Pt started using his SPC a couple of weeks ago. Home health PT ended last week. Independent ambulation prior to L hip fracture.  PRECAUTIONS: fall risk,   SUBJECTIVE: Needs to leave by 11:30 am today. Back is good this morning. Back was good after last session. Feels like he needs more PT for balance after this week.   PAIN:  Are you having pain? 0/10    TODAY'S TREATMENT:   Therapeutic exerise     Gait with pt NBQC 200 ft with 1 Air Ex pad, 4 mini hurdles, and 1 four inch step along the way for obstacle negotiation. CGA   Then with AD assist PRN 200 ft, CGA  SLS with contralateral UE assist   R 10x5 seconds for 3 sets  L 10x5 seconds for 3 sets  Reviewed POC: continue working on balance for another 2x/week for 6 week  Standing  hip abduction with B UE assist   Yellow band around ankles    R 10x3   L 10x3  Standing hip extension with B UE assist   Yellow band around ankles    R 10x3   L 10x3  Stand to sit with minimal UE assist 5x      Improved exercise technique, movement at target joints, use of target muscles after mod verbal, visual, tactile cues.        Response to treatment Pt tolerated session well without aggravation of symptoms.      Clinical impression  Continued working on glute and  quad strength as well as obstacle negotiation to decrease fall risk. Difficulty stepping over an obstacle with R LE without AD assist in which L glute med weakness may play a factor with L LE single leg stance strength to perform task. Added SLS with counter assist to help address.  Pt tolerated session well without aggravation of symptoms. Pt will benefit from continued skilled physical therapy services to decrease pain, improve strength, balance and function.         PATIENT EDUCATION: Education details: there-ex, HEP Person educated: Patient Education method: Explanation, Demonstration, Tactile cues, Verbal cues, and Handouts Education comprehension: verbalized understanding and returned demonstration   HOME EXERCISE PROGRAM:       Access Code: [email or web address] URL: [email or web address] Date: 07/05/2022 Prepared by: Joneen Boers    Exercises - Standing Hip Abduction with Counter Support  - 2 x daily - 7 x weekly - 1 sets - 10 reps - 5 seconds hold - Sit to Stand with Armchair  - 1 x daily - 3 x weekly - 2 sets - 8 reps - Scapular Retraction with Resistance  - 1 x daily - 3 x weekly - 3 sets - 12 reps The HEP added last session (07/19/2022) per pt: sit <> stand, stepping over obstacles, walking forward and back - Shoulder extension with resistance - Neutral  - 1 x daily - 7 x weekly - 1 sets - 10 reps - 5 seconds hold (RED BAND) - Standing Single Leg Stance with Unilateral Counter Support   - 1 x daily - 7 x weekly - 3 sets - 10 reps - 5 seconds hold     PT Short Term Goals - 08/10/22 1019       PT SHORT TERM GOAL #1   Title Pt will be independent with his initial HEP to decrease pain, improve strength, balance, and function.    Baseline Pt has started his initial HEP (07/05/2022); 08/10/2022 reports independence compelting as prescribed.    Time 3    Period Weeks    Status Achieved    Target Date 08/10/22              PT Long Term Goals - 10/04/22 0945       PT LONG TERM GOAL #1   Title Patient will have a decrease in low back pain to 4/10 or less at worst to promote ability to perform standing tasks, ambulate, and negotiate stairs with less difficulty.    Baseline 10/10 low back pain at worst for the past 3 months (07/05/2022); denies back pain with standing, walking or stair negotiation; 4/10 at worst for the past 7 days (09/05/2022)    Time 8    Period Weeks    Status Achieved    Target Date 08/10/22      PT LONG TERM GOAL #2   Title Patient will improve his lumbar FOTO score by at least 10 points as a demonstration of improved function.    Baseline Lumbar spine FOTO 40 (07/05/2022); 52    Time 8    Period Weeks    Status Achieved    Target Date 08/10/22      PT LONG TERM GOAL #3   Title Pt will improve his TUG score to 12 seconds or less without SPC as a demonstration of improved balance and functional mobility.    Baseline 18.33 seconds average with SPC on R side (07/05/2022); 22.05 sec  with SPC on RUE; 09/05/2022); 15 seconds average with SPC (10/04/2022)  Time 8    Period Weeks    Status Partially Met    Target Date 11/03/22      PT LONG TERM GOAL #4   Title Pt will improve bilateral bilateral hip extension and abduction to promote ability to perform standing tasks more steadily.    Baseline Hip extension 4+/5 R and L, hip abduction 4+/5 R, 4/5 L (07/05/2022); deferred for primary PT; hip extension 5/5 R and L, hip abduction 5/5 R and L (09/05/2022)     Time 8    Period Weeks    Status Achieved    Target Date 09/01/22      PT LONG TERM GOAL #5   Title Pt will improve 5xSTS without UE support to 14.8 sec or less to demonstrate clinically significant improvement for age matched norms for LE strength and reduced risk of falls.    Baseline 08/10/22: 27.29 with UE's on thighs standard height chair; B UE assist 14.18 seconds (09/05/2022); 13.50 seconds (10/04/2022)    Time 8    Period Weeks    Status Achieved    Target Date 11/03/22      Additional Long Term Goals   Additional Long Term Goals Yes      PT LONG TERM GOAL #6   Title Pt will improved DGI score to > 19 points as a demonstration of improved balance.    Baseline DGI 17 points    Time 4    Period Weeks    Status New    Target Date 11/03/22              Plan - 10/31/22 1051     Clinical Impression Statement Continued working on glute and quad strength as well as obstacle negotiation to decrease fall risk. Difficulty stepping over an obstacle with R LE without AD assist in which L glute med weakness may play a factor with L LE single leg stance strength to perform task. Added SLS with counter assist to help address.  Pt tolerated session well without aggravation of symptoms. Pt will benefit from continued skilled physical therapy services to decrease pain, improve strength, balance and function.    Personal Factors and Comorbidities Age;Comorbidity 3+;Past/Current Experience;Time since onset of injury/illness/exacerbation    Comorbidities A-fib, arthritis, basal cell CA, chronic kidney disease, dysrhythmia, HTN, MI, phlebitis, L hip hemiarthroplasty    Examination-Activity Limitations Stairs;Stand;Bend;Locomotion Level;Transfers    Stability/Clinical Decision Making Stable/Uncomplicated    Clinical Decision Making Low    Rehab Potential Fair    PT Frequency 2x / week    PT Duration 8 weeks    PT Treatment/Interventions Therapeutic exercise;Therapeutic activities;Stair  training;Functional mobility training;Balance training;Neuromuscular re-education;Patient/family education;Manual techniques;Dry needling;Aquatic Therapy;Electrical Stimulation;Iontophoresis 74m/ml Dexamethasone;Gait training    PT Next Visit Plan posture, thoracic and hip extension, trunk and hip strengthening, manual techniques, modalities PRN    PT Home Exercise Plan Medbridge Access Code: [email or web address]   Consulted and Agree with Plan of Care Patient                              MJoneen BoersPT, DPT   10/31/2022, 11:37 AM

## 2022-11-03 ENCOUNTER — Ambulatory Visit: Payer: Medicare Other

## 2022-11-03 DIAGNOSIS — R2681 Unsteadiness on feet: Secondary | ICD-10-CM

## 2022-11-03 DIAGNOSIS — M5459 Other low back pain: Secondary | ICD-10-CM

## 2022-11-03 DIAGNOSIS — R262 Difficulty in walking, not elsewhere classified: Secondary | ICD-10-CM

## 2022-11-03 NOTE — Therapy (Signed)
OUTPATIENT PHYSICAL THERAPY TREATMENT NOTE    Patient Name: Robert Rivas MRN: [id number] DOB:Jan 30, 1941, 81 y.o., male Today's Date: 11/03/2022  PCP: Idelle Crouch, MD REFERRING PROVIDER: Idelle Crouch, MD   PT End of Session - 11/03/22 1150     Visit Number 29    Number of Visits 71    Date for PT Re-Evaluation 12/15/22    Authorization Type [employer] Medicare Primary; [employer] for [employer] sceondary    PT Start Time 1150    PT Stop Time 1232    PT Time Calculation (min) 42 min    Equipment Utilized During Treatment Gait belt    Activity Tolerance Patient tolerated treatment well    Behavior During Therapy WFL for tasks assessed/performed                                   Past Medical History:  Diagnosis Date   A-fib ([place])    Arthritis    lower back   Basal cell carcinoma 10/29/2020   Right post base of skull/neck   BPH (benign prostatic hyperplasia)    Cancer (HCC)    Skin Cancer   Chronic kidney disease    Chronic Kidney Disease   Coronary artery disease    Dysrhythmia    Atrial Fibrillation; Supraventricular Tachycardia   Gout    Hearing aid worn    has, does not wear   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   L upper back 5.0cm inf to base of neck, 5.0cm lat to spine   Hyperlipidemia    Hypertension    MI, old    Nocturia    Phlebitis    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   SCC IS R foreearm distal   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   SCC IS R forearm proximal   Squamous cell carcinoma of skin 03/26/2018   SCC IS R forearm   Squamous cell carcinoma of skin 01/01/2008   SCC IS R forearm   Wears dentures    partial   Past Surgical History:  Procedure Laterality Date   cardaic stents     CARDIAC CATHETERIZATION     PTCA with Stent Placement   CARDIOVERSION N/A 04/13/2021   Procedure: CARDIOVERSION;  Surgeon: Corey Skains, MD;  Location: [facility];  Service: Cardiovascular;  Laterality: N/A;   CARDIOVERSION  N/A 05/05/2021   Procedure: CARDIOVERSION;  Surgeon: Corey Skains, MD;  Location: [facility];  Service: Cardiovascular;  Laterality: N/A;   COLONOSCOPY WITH PROPOFOL N/A 12/15/2015   Procedure: COLONOSCOPY WITH PROPOFOL;  Surgeon: Lollie Sails, MD;  Location: [facility] END[facility];  Service: Endoscopy;  Laterality: N/A;   CORONARY ARTERY BYPASS GRAFT  1992   ECTROPION REPAIR Right 12/12/2017   Procedure: REPAIR OF ECTROPION SUTURES/EXTENSIVE RIGHT;  Surgeon: Karle Starch, MD;  Location: [place];  Service: Ophthalmology;  Laterality: Right;   HERNIA REPAIR     HIP ARTHROPLASTY Left 02/19/2022   Procedure: ARTHROPLASTY BIPOLAR HIP (HEMIARTHROPLASTY);  Surgeon: Thornton Park, MD;  Location: [facility];  Service: Orthopedics;  Laterality: Left;   IR KYPHO LUMBAR INC FX REDUCE BONE BX UNI/BIL CANNULATION INC/IMAGING  06/07/2022   IR RADIOLOGIST EVAL & MGMT  06/30/2022   SVT ABLATION  02/22/2017   [facility]   Patient Active Problem List   Diagnosis Date Noted   Acute urinary retention 02/19/2022   Acute delirium 02/18/2022  Dysphagia 02/18/2022   Hyponatremia 02/15/2022   Closed left hip fracture ([place]) 02/14/2022   Atrial fibrillation with RVR ([place]) 04/12/2021   Bradycardia 07/08/2019   Venous insufficiency of both lower extremities 07/08/2019   Abnormal glucose 01/10/2019   Elevated PSA 01/10/2019   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   Palpitations 06/13/2018   Leg edema, right 09/20/2017   History of BPH 02/21/2017   BPH (benign prostatic hyperplasia) 04/25/2016   Nocturia 04/25/2016   Gout 03/25/2016   History of colon polyps 03/25/2016   Inflammation of a vein 03/25/2016   Percutaneous transluminal coronary angioplasty status 03/25/2016   Supraventricular tachycardia 03/25/2016   Benign essential HTN 06/10/2015   Paroxysmal atrial fibrillation ([place]) 01/20/2015   Atrial fibrillation ([place]) 05/13/2014   Coronary artery disease 05/13/2014   Chest pain 05/13/2014   Stage 3a chronic  kidney disease (CKD) ([place]) 05/13/2014   Essential (primary) hypertension 05/13/2014   History of cardiac catheterization 05/13/2014   HLD (hyperlipidemia) 05/13/2014   Combined fat and carbohydrate induced hyperlipemia 05/13/2014   History of cardiovascular surgery 05/13/2014   History of anticoagulant therapy 04/28/2014   Warfarin anticoagulation 04/28/2014   Benign prostatic hyperplasia with urinary obstruction 11/05/2013    REFERRING DIAG: LBP  THERAPY DIAG:  Unsteadiness on feet - Plan: PT plan of care cert/re-cert  Other low back pain - Plan: PT plan of care cert/re-cert  Difficulty in walking, not elsewhere classified - Plan: PT plan of care cert/re-cert  Rationale for Evaluation and Treatment Rehabilitation  PERTINENT HISTORY: LBP, balance. Pt fell 02/17/2022 while trying to step up onto his half step to get into his home in the garage. Pt tripped on the rug which is no longer there. Pt fractured L hip S/P L hip hemiarthroplasty on 02/19/2022. Pt underwent home health PT for the hip rehab. Pt fell again a couple of weeks after participating in rehab resulting in lumbar fracture. Does not remember what caused the fall. Pt was doing something in the hall. Pt does not remember if he was using an AD. Pt had a shot in his back which took away the pain. Pt started using his SPC a couple of weeks ago. Home health PT ended last week. Independent ambulation prior to L hip fracture.  PRECAUTIONS: fall risk,   SUBJECTIVE: Back is good. Balance is feeling pretty good. Can use some more PT to improve.   PAIN:  Are you having pain? 0/10    TODAY'S TREATMENT:   Therapeutic exerise   Sit <> stand from regular chair with arms with B UE assist 5x, emphasis on proper weight shifting for placing his center of gravity over his base of support.   Standing up from a chair, walking 10 ft forward, then returning 10 ft, then sitting back onto chair 3x  No AD: 18.24 seconds  With NBQC: 17.77  seconds; 15.53 seconds; 15.35 seconds (16.2 seconds average with NBQC)  Directed patient with gait with normal gait speed, with changes in speed, 180 degree pivot turn, with R and L cervical rotation position, with cervical flexion and extension position, stepping around obstacles, stepping over an obstacle, ascending and descending 4 regular steps with UE assist   Reviewed progress/current status with PT towards goals   Reviewed POC: continued another 2x/week for 6 weeks   SLS with contralateral UE assist   R 10x5 seconds for 3 sets  L 10x5 seconds for 3 sets        Improved exercise technique, movement at target joints, use of  target muscles after mod verbal, visual, tactile cues.        Response to treatment Pt tolerated session well without aggravation of symptoms.      Clinical impression  Pt demonstrates overall decreased low back pain and improved function since initial evaluation. TUG times seem to vary but still better than initial measurement. Pt demonstrates improvement with DGI score by 1 point with improved ability to step over shoe box/obstacle without AD assist today. Pt making progress with PT towards goals and improving balance. Pt tolerated session well without aggravation of symptoms. Pt will benefit from continued skilled physical therapy services to decrease pain, improve strength, balance and function.         PATIENT EDUCATION: Education details: there-ex, HEP Person educated: Patient Education method: Explanation, Demonstration, Tactile cues, Verbal cues, and Handouts Education comprehension: verbalized understanding and returned demonstration   HOME EXERCISE PROGRAM:       Access Code: [email or web address] URL: [email or web address] Date: 07/05/2022 Prepared by: Joneen Boers    Exercises - Standing Hip Abduction with Counter Support  - 2 x daily - 7 x weekly - 1 sets - 10 reps - 5 seconds hold - Sit to Stand with Armchair  - 1 x daily  - 3 x weekly - 2 sets - 8 reps - Scapular Retraction with Resistance  - 1 x daily - 3 x weekly - 3 sets - 12 reps The HEP added last session (07/19/2022) per pt: sit <> stand, stepping over obstacles, walking forward and back - Shoulder extension with resistance - Neutral  - 1 x daily - 7 x weekly - 1 sets - 10 reps - 5 seconds hold (RED BAND) - Standing Single Leg Stance with Unilateral Counter Support  - 1 x daily - 7 x weekly - 3 sets - 10 reps - 5 seconds hold     PT Short Term Goals - 08/10/22 1019       PT SHORT TERM GOAL #1   Title Pt will be independent with his initial HEP to decrease pain, improve strength, balance, and function.    Baseline Pt has started his initial HEP (07/05/2022); 08/10/2022 reports independence compelting as prescribed.    Time 3    Period Weeks    Status Achieved    Target Date 08/10/22              PT Long Term Goals - 11/03/22 1152       PT LONG TERM GOAL #1   Title Patient will have a decrease in low back pain to 4/10 or less at worst to promote ability to perform standing tasks, ambulate, and negotiate stairs with less difficulty.    Baseline 10/10 low back pain at worst for the past 3 months (07/05/2022); denies back pain with standing, walking or stair negotiation; 4/10 at worst for the past 7 days (09/05/2022)    Time 8    Period Weeks    Status Achieved    Target Date 08/10/22      PT LONG TERM GOAL #2   Title Patient will improve his lumbar FOTO score by at least 10 points as a demonstration of improved function.    Baseline Lumbar spine FOTO 40 (07/05/2022); 52    Time 8    Period Weeks    Status Achieved    Target Date 08/10/22      PT LONG TERM GOAL #3   Title Pt will improve his TUG score to 12 seconds or less without  SPC as a demonstration of improved balance and functional mobility.    Baseline 18.33 seconds average with SPC on R side (07/05/2022); 22.05 sec  with SPC on RUE; 09/05/2022); 15 seconds average with SPC (10/04/2022);  16.2 seconds average with his NBQC (11/03/2022)    Time 6    Period Weeks    Status Partially Met    Target Date 12/15/22      PT LONG TERM GOAL #4   Title Pt will improve bilateral bilateral hip extension and abduction to promote ability to perform standing tasks more steadily.    Baseline Hip extension 4+/5 R and L, hip abduction 4+/5 R, 4/5 L (07/05/2022); deferred for primary PT; hip extension 5/5 R and L, hip abduction 5/5 R and L (09/05/2022)    Time 8    Period Weeks    Status Achieved    Target Date 09/01/22      PT LONG TERM GOAL #5   Title Pt will improve 5xSTS without UE support to 14.8 sec or less to demonstrate clinically significant improvement for age matched norms for LE strength and reduced risk of falls.    Baseline 08/10/22: 27.29 with UE's on thighs standard height chair; B UE assist 14.18 seconds (09/05/2022); 13.50 seconds (10/04/2022)    Time 8    Period Weeks    Status Achieved    Target Date 11/03/22      PT LONG TERM GOAL #6   Title Pt will improved DGI score to > 19 points as a demonstration of improved balance.    Baseline DGI 17 points; 18 (11/03/2022)    Time 6    Period Weeks    Status On-going    Target Date 12/15/22              Plan - 11/03/22 1828     Clinical Impression Statement Pt demonstrates overall decreased low back pain and improved function since initial evaluation. TUG times seem to vary but still better than initial measurement. Pt demonstrates improvement with DGI score by 1 point with improved ability to step over shoe box/obstacle without AD assist today. Pt making progress with PT towards goals and improving balance. Pt tolerated session well without aggravation of symptoms. Pt will benefit from continued skilled physical therapy services to decrease pain, improve strength, balance and function.    Personal Factors and Comorbidities Age;Comorbidity 3+;Past/Current Experience;Time since onset of injury/illness/exacerbation     Comorbidities A-fib, arthritis, basal cell CA, chronic kidney disease, dysrhythmia, HTN, MI, phlebitis, L hip hemiarthroplasty    Examination-Activity Limitations Stairs;Stand;Bend;Locomotion Level;Transfers    Stability/Clinical Decision Making Stable/Uncomplicated    Clinical Decision Making Low    Rehab Potential Fair    PT Frequency 2x / week    PT Duration 6 weeks    PT Treatment/Interventions Therapeutic exercise;Therapeutic activities;Stair training;Functional mobility training;Balance training;Neuromuscular re-education;Patient/family education;Manual techniques;Dry needling;Aquatic Therapy;Electrical Stimulation;Iontophoresis 17m/ml Dexamethasone;Gait training    PT Next Visit Plan posture, thoracic and hip extension, trunk and hip strengthening, manual techniques, modalities PRN    PT Home Exercise Plan Medbridge Access Code: [email or web address]   Consulted and Agree with Plan of Care Patient                               MJoneen BoersPT, DPT   11/03/2022, 6:30 PM

## 2022-11-10 ENCOUNTER — Ambulatory Visit: Payer: Medicare Other

## 2022-11-10 DIAGNOSIS — R262 Difficulty in walking, not elsewhere classified: Secondary | ICD-10-CM

## 2022-11-10 DIAGNOSIS — M5459 Other low back pain: Secondary | ICD-10-CM

## 2022-11-10 DIAGNOSIS — R2681 Unsteadiness on feet: Secondary | ICD-10-CM

## 2022-11-10 NOTE — Therapy (Signed)
OUTPATIENT PHYSICAL THERAPY TREATMENT NOTE    Patient Name: Robert Rivas MRN: [id number] DOB:1941-03-03, 81 y.o., male Today's Date: 11/10/2022  PCP: Idelle Crouch, MD REFERRING PROVIDER: Idelle Crouch, MD   PT End of Session - 11/10/22 0813     Visit Number 30    Number of Visits 45    Date for PT Re-Evaluation 12/15/22    Authorization Type [employer] Medicare Primary; [employer] for [employer] sceondary    Authorization Time Period 11/03/22-12/15/22    Progress Note Due on Visit 40    PT Start Time 0755    PT Stop Time 0835    PT Time Calculation (min) 40 min    Activity Tolerance Patient tolerated treatment well;No increased pain    Behavior During Therapy WFL for tasks assessed/performed                                   Past Medical History:  Diagnosis Date   A-fib [facility])    Arthritis    lower back   Basal cell carcinoma 10/29/2020   Right post base of skull/neck   BPH (benign prostatic hyperplasia)    Cancer (HCC)    Skin Cancer   Chronic kidney disease    Chronic Kidney Disease   Coronary artery disease    Dysrhythmia    Atrial Fibrillation; Supraventricular Tachycardia   Gout    Hearing aid worn    has, does not wear   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   L upper back 5.0cm inf to base of neck, 5.0cm lat to spine   Hyperlipidemia    Hypertension    MI, old    Nocturia    Phlebitis    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   SCC IS R foreearm distal   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   SCC IS R forearm proximal   Squamous cell carcinoma of skin 03/26/2018   SCC IS R forearm   Squamous cell carcinoma of skin 01/01/2008   SCC IS R forearm   Wears dentures    partial   Past Surgical History:  Procedure Laterality Date   cardaic stents     CARDIAC CATHETERIZATION     PTCA with Stent Placement   CARDIOVERSION N/A 04/13/2021   Procedure: CARDIOVERSION;  Surgeon: Corey Skains, MD;  Location: [facility] ORS;  Service:  Cardiovascular;  Laterality: N/A;   CARDIOVERSION N/A 05/05/2021   Procedure: CARDIOVERSION;  Surgeon: Corey Skains, MD;  Location: [facility] ORS;  Service: Cardiovascular;  Laterality: N/A;   COLONOSCOPY WITH PROPOFOL N/A 12/15/2015   Procedure: COLONOSCOPY WITH PROPOFOL;  Surgeon: Lollie Sails, MD;  Location: [facility] ENDOSCOPY;  Service: Endoscopy;  Laterality: N/A;   CORONARY ARTERY BYPASS GRAFT  1992   ECTROPION REPAIR Right 12/12/2017   Procedure: REPAIR OF ECTROPION SUTURES/EXTENSIVE RIGHT;  Surgeon: Karle Starch, MD;  Location: [place];  Service: Ophthalmology;  Laterality: Right;   HERNIA REPAIR     HIP ARTHROPLASTY Left 02/19/2022   Procedure: ARTHROPLASTY BIPOLAR HIP (HEMIARTHROPLASTY);  Surgeon: Thornton Park, MD;  Location: [facility] ORS;  Service: Orthopedics;  Laterality: Left;   IR KYPHO LUMBAR INC FX REDUCE BONE BX UNI/BIL CANNULATION INC/IMAGING  06/07/2022   IR RADIOLOGIST EVAL & MGMT  06/30/2022   SVT ABLATION  02/22/2017   [facility]   Patient Active Problem List   Diagnosis Date Noted   Acute urinary  retention 02/19/2022   Acute delirium 02/18/2022   Dysphagia 02/18/2022   Hyponatremia 02/15/2022   Closed left hip fracture ([place]) 02/14/2022   Atrial fibrillation with RVR ([place]) 04/12/2021   Bradycardia 07/08/2019   Venous insufficiency of both lower extremities 07/08/2019   Abnormal glucose 01/10/2019   Elevated PSA 01/10/2019   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   Palpitations 06/13/2018   Leg edema, right 09/20/2017   History of BPH 02/21/2017   BPH (benign prostatic hyperplasia) 04/25/2016   Nocturia 04/25/2016   Gout 03/25/2016   History of colon polyps 03/25/2016   Inflammation of a vein 03/25/2016   Percutaneous transluminal coronary angioplasty status 03/25/2016   Supraventricular tachycardia 03/25/2016   Benign essential HTN 06/10/2015   Paroxysmal atrial fibrillation ([place]) 01/20/2015   Atrial fibrillation ([place]) 05/13/2014   Coronary artery disease  05/13/2014   Chest pain 05/13/2014   Stage 3a chronic kidney disease (CKD) ([place]) 05/13/2014   Essential (primary) hypertension 05/13/2014   History of cardiac catheterization 05/13/2014   HLD (hyperlipidemia) 05/13/2014   Combined fat and carbohydrate induced hyperlipemia 05/13/2014   History of cardiovascular surgery 05/13/2014   History of anticoagulant therapy 04/28/2014   Warfarin anticoagulation 04/28/2014   Benign prostatic hyperplasia with urinary obstruction 11/05/2013    REFERRING DIAG: LBP  THERAPY DIAG:  Unsteadiness on feet  Other low back pain  Difficulty in walking, not elsewhere classified  Rationale for Evaluation and Treatment Rehabilitation  PERTINENT HISTORY: LBP, balance. Pt fell 02/17/2022 while trying to step up onto his half step to get into his home in the garage. Pt tripped on the rug which is no longer there. Pt fractured L hip S/P L hip hemiarthroplasty on 02/19/2022. Pt underwent home health PT for the hip rehab. Pt fell again a couple of weeks after participating in rehab resulting in lumbar fracture. Does not remember what caused the fall. Pt was doing something in the hall. Pt does not remember if he was using an AD. Pt had a shot in his back which took away the pain. Pt started using his SPC a couple of weeks ago. Home health PT ended last week. Independent ambulation prior to L hip fracture.  PRECAUTIONS: fall risk,   SUBJECTIVE: Pt doing good today, no pertinent updates. Back remains stiff and aggravated, limiting factor for longer walking.    PAIN:  Are you having pain? 0/10    TODAY'S TREATMENT:   [facility] PT Assessment - 11/10/22 0001       Assessment   Medical Diagnosis LBP    Referring Provider (PT) Idelle Crouch, MD    Onset Date/Surgical Date 06/30/22   Date PT referral signed     Precautions   Precaution Comments Fall risk      Balance Screen   Has the patient fallen in the past 6 months No   no falls since March 2023      Observation/Other Assessments   Focus on Therapeutic Outcomes (FOTO)  56   initital score 40            FOTO survey: 56 BLE Strength Assessment 11/10/22     Right  Left  Hip Flexion 5/5 5-/5  Hip Horizontal ABDCT  5/5 5-/5  Hip Horizontal ADD 5/5 5-/5  Hip IR (seated)  5/5 5/5  Hip ER (seated) 5/5 5/5  Knee Flexion (seated) 5/5 5/5    -seated lumbar flexion stretch (elbows on silver physio ball) 3x40sec  *per pt , stretch feels productive, no aggravating to back pain *  maintains a lordosis in lumbar spine due to hypomobility  -standing lunge hip flexor stretch 2x40sec bilat, BUE support *reports focal hip flexor stretch achieved  -overground AMB 365f c SBQC, reports back feels good, no pain aggravation  -10 STS from elevated seat (good symmetry)  -10 STS from elevated seat, right foot on airex pad to bias Left hip extension loading *cues for hands knees  -standing step stool toe taps, alternating with SBQC RUE x30  -standing step stool toe taps, alternating WITHOUT SBQC RUE x20 (minguard assist)  -standing RLE CL step stool taps (crossing midline) x10, minGuard assis, no device     Response to treatment Pt tolerated session well without aggravation of symptoms.      Clinical impression Wrapped up Reassessment measures. Pt's MMT showing dramatic improvement since July. FOTO score also much improved. Pt shown 2 new stretches for ongoing lumbosacral pain, both seem favorable to resting pain but mor e importantly AMB tolerance, pt asking to add to HEP, but aPryor Curiadefers this to more extensive trial in clinic prior to adding to home. [facility] which was discussed last visit as well. Pt tolerated session well without aggravation of symptoms. Pt will benefit from continued skilled physical therapy services to decrease pain, improve strength, balance and function.     PATIENT EDUCATION: Education details: there-ex, HEP Person  educated: Patient Education method: Explanation, Demonstration, Tactile cues, Verbal cues, and Handouts Education comprehension: verbalized understanding and returned demonstration   HOME EXERCISE PROGRAM:  Access Code: [email or web address] URL: [email or web address] Date: 07/05/2022 Prepared by: MJoneen Boers   Exercises - Standing Hip Abduction with Counter Support  - 2 x daily - 7 x weekly - 1 sets - 10 reps - 5 seconds hold - Sit to Stand with Armchair  - 1 x daily - 3 x weekly - 2 sets - 8 reps - Scapular Retraction with Resistance  - 1 x daily - 3 x weekly - 3 sets - 12 reps The HEP added last session (07/19/2022) per pt: sit <> stand, stepping over obstacles, walking forward and back - Shoulder extension with resistance - Neutral  - 1 x daily - 7 x weekly - 1 sets - 10 reps - 5 seconds hold (RED BAND) - Standing Single Leg Stance with Unilateral Counter Support  - 1 x daily - 7 x weekly - 3 sets - 10 reps - 5 seconds hold     PT Short Term Goals - 08/10/22 1019       PT SHORT TERM GOAL #1   Title Pt will be independent with his initial HEP to decrease pain, improve strength, balance, and function.    Baseline Pt has started his initial HEP (07/05/2022); 08/10/2022 reports independence compelting as prescribed.    Time 3    Period Weeks    Status Achieved    Target Date 08/10/22              PT Long Term Goals - 11/03/22 1152       PT LONG TERM GOAL #1   Title Patient will have a decrease in low back pain to 4/10 or less at worst to promote ability to perform standing tasks, ambulate, and negotiate stairs with less difficulty.    Baseline 10/10 low back pain at worst for the past 3 months (07/05/2022); denies back pain with standing, walking or stair negotiation; 4/10 at worst for the past 7 days (09/05/2022)    Time 8  Period Weeks    Status Achieved    Target Date 08/10/22      PT LONG TERM GOAL #2   Title Patient will improve his lumbar FOTO score by at  least 10 points as a demonstration of improved function.    Baseline Lumbar spine FOTO 40 (07/05/2022); 52    Time 8    Period Weeks    Status Achieved    Target Date 08/10/22      PT LONG TERM GOAL #3   Title Pt will improve his TUG score to 12 seconds or less without SPC as a demonstration of improved balance and functional mobility.    Baseline 18.33 seconds average with SPC on R side (07/05/2022); 22.05 sec  with SPC on RUE; 09/05/2022); 15 seconds average with SPC (10/04/2022); 16.2 seconds average with his NBQC (11/03/2022)    Time 6    Period Weeks    Status Partially Met    Target Date 12/15/22      PT LONG TERM GOAL #4   Title Pt will improve bilateral bilateral hip extension and abduction to promote ability to perform standing tasks more steadily.    Baseline Hip extension 4+/5 R and L, hip abduction 4+/5 R, 4/5 L (07/05/2022); deferred for primary PT; hip extension 5/5 R and L, hip abduction 5/5 R and L (09/05/2022)    Time 8    Period Weeks    Status Achieved    Target Date 09/01/22      PT LONG TERM GOAL #5   Title Pt will improve 5xSTS without UE support to 14.8 sec or less to demonstrate clinically significant improvement for age matched norms for LE strength and reduced risk of falls.    Baseline 08/10/22: 27.29 with UE's on thighs standard height chair; B UE assist 14.18 seconds (09/05/2022); 13.50 seconds (10/04/2022)    Time 8    Period Weeks    Status Achieved    Target Date 11/03/22      PT LONG TERM GOAL #6   Title Pt will improved DGI score to > 19 points as a demonstration of improved balance.    Baseline DGI 17 points; 18 (11/03/2022)    Time 6    Period Weeks    Status On-going    Target Date 12/15/22              Plan - 11/10/22 0848     Clinical Impression Statement Wrapped up Reassessment measures. Pt's MMT showing dramatic improvement since July. FOTO score also much improved. Pt shown 2 new stretches for ongoing lumbosacral pain, both seem  favorable to resting pain but mor eimportantly AMB tolerance, pt asking to add to HEP, but Pryor Curia defers this to more extensive trial in clinic prior to adding to home. Discussed need to transition back to [facility] now, which was discussed last visit as well. Pt tolerated session well without aggravation of symptoms. Pt will benefit from continued skilled physical therapy services to decrease pain, improve strength, balance and function.    Personal Factors and Comorbidities Age;Comorbidity 3+;Past/Current Experience;Time since onset of injury/illness/exacerbation    Comorbidities A-fib, arthritis, basal cell CA, chronic kidney disease, dysrhythmia, HTN, MI, phlebitis, L hip hemiarthroplasty    Examination-Activity Limitations Stairs;Stand;Bend;Locomotion Level;Transfers    Stability/Clinical Decision Making Stable/Uncomplicated    Clinical Decision Making Low    Rehab Potential Fair    PT Frequency 2x / week    PT Duration 6 weeks    PT Treatment/Interventions Therapeutic  exercise;Therapeutic activities;Stair training;Functional mobility training;Balance training;Neuromuscular re-education;Patient/family education;Manual techniques;Dry needling;Aquatic Therapy;Electrical Stimulation;Iontophoresis 12m/ml Dexamethasone;Gait training    PT Next Visit Plan posture, thoracic and hip extension, trunk and hip strengthening, manual techniques, modalities PRN    PT Home Exercise Plan Medbridge Access Code: [email or web address]   Consulted and Agree with Plan of Care Patient           8:49 AM, 11/10/22 AEtta Grandchild PT, DPT Physical Therapist - [phone](Office)     11/10/2022, 8:49 AM

## 2022-11-22 ENCOUNTER — Ambulatory Visit: Payer: Medicare Other

## 2022-11-24 ENCOUNTER — Ambulatory Visit: Payer: Medicare Other

## 2022-11-24 DIAGNOSIS — R262 Difficulty in walking, not elsewhere classified: Secondary | ICD-10-CM

## 2022-11-24 DIAGNOSIS — M5459 Other low back pain: Secondary | ICD-10-CM

## 2022-11-24 DIAGNOSIS — R2681 Unsteadiness on feet: Secondary | ICD-10-CM

## 2022-11-24 NOTE — Therapy (Signed)
OUTPATIENT PHYSICAL THERAPY TREATMENT NOTE    Patient Name: Robert Rivas MRN: [id number] DOB:July 01, 1941, 81 y.o., male Today's Date: 11/24/2022  PCP: Idelle Crouch, MD REFERRING PROVIDER: Idelle Crouch, MD   PT End of Session - 11/24/22 0932     Visit Number 31    Number of Visits 45    Date for PT Re-Evaluation 12/15/22    Authorization Type [employer] Medicare Primary; [employer] sceondary    Authorization Time Period 11/03/22-12/15/22    Progress Note Due on Visit 40    PT Start Time 0932    PT Stop Time 1013    PT Time Calculation (min) 41 min    Activity Tolerance Patient tolerated treatment well;No increased pain    Behavior During Therapy WFL for tasks assessed/performed                                    Past Medical History:  Diagnosis Date   A-fib [facility])    Arthritis    lower back   Basal cell carcinoma 10/29/2020   Right post base of skull/neck   BPH (benign prostatic hyperplasia)    Cancer (HCC)    Skin Cancer   Chronic kidney disease    Chronic Kidney Disease   Coronary artery disease    Dysrhythmia    Atrial Fibrillation; Supraventricular Tachycardia   Gout    Hearing aid worn    has, does not wear   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   L upper back 5.0cm inf to base of neck, 5.0cm lat to spine   Hyperlipidemia    Hypertension    MI, old    Nocturia    Phlebitis    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   SCC IS R foreearm distal   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   SCC IS R forearm proximal   Squamous cell carcinoma of skin 03/26/2018   SCC IS R forearm   Squamous cell carcinoma of skin 01/01/2008   SCC IS R forearm   Wears dentures    partial   Past Surgical History:  Procedure Laterality Date   cardaic stents     CARDIAC CATHETERIZATION     PTCA with Stent Placement   CARDIOVERSION N/A 04/13/2021   Procedure: CARDIOVERSION;  Surgeon: Corey Skains, MD;  Location: [facility] ORS;  Service:  Cardiovascular;  Laterality: N/A;   CARDIOVERSION N/A 05/05/2021   Procedure: CARDIOVERSION;  Surgeon: Corey Skains, MD;  Location: [facility] OR[facility];  Service: Cardiovascular;  Laterality: N/A;   COLONOSCOPY WITH PROPOFOL N/A 12/15/2015   Procedure: COLONOSCOPY WITH PROPOFOL;  Surgeon: Lollie Sails, MD;  Location: [facility];  Service: Endoscopy;  Laterality: N/A;   CORONARY ARTERY BYPASS GRAFT  1992   ECTROPION REPAIR Right 12/12/2017   Procedure: REPAIR OF ECTROPION SUTURES/EXTENSIVE RIGHT;  Surgeon: Karle Starch, MD;  Location: [place];  Service: Ophthalmology;  Laterality: Right;   HERNIA REPAIR     HIP ARTHROPLASTY Left 02/19/2022   Procedure: ARTHROPLASTY BIPOLAR HIP (HEMIARTHROPLASTY);  Surgeon: Thornton Park, MD;  Location: [facility] OR[facility];  Service: Orthopedics;  Laterality: Left;   IR KYPHO LUMBAR INC FX REDUCE BONE BX UNI/BIL CANNULATION INC/IMAGING  06/07/2022   IR RADIOLOGIST EVAL & MGMT  06/30/2022   SVT ABLATION  02/22/2017   [facility]   Patient Active Problem List   Diagnosis Date Noted   Acute  urinary retention 02/19/2022   Acute delirium 02/18/2022   Dysphagia 02/18/2022   Hyponatremia 02/15/2022   Closed left hip fracture ([place]) 02/14/2022   Atrial fibrillation with RVR ([place]) 04/12/2021   Bradycardia 07/08/2019   Venous insufficiency of both lower extremities 07/08/2019   Abnormal glucose 01/10/2019   Elevated PSA 01/10/2019   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   Palpitations 06/13/2018   Leg edema, right 09/20/2017   History of BPH 02/21/2017   BPH (benign prostatic hyperplasia) 04/25/2016   Nocturia 04/25/2016   Gout 03/25/2016   History of colon polyps 03/25/2016   Inflammation of a vein 03/25/2016   Percutaneous transluminal coronary angioplasty status 03/25/2016   Supraventricular tachycardia 03/25/2016   Benign essential HTN 06/10/2015   Paroxysmal atrial fibrillation ([place]) 01/20/2015   Atrial fibrillation ([place]) 05/13/2014   Coronary artery disease  05/13/2014   Chest pain 05/13/2014   Stage 3a chronic kidney disease (CKD) ([place]) 05/13/2014   Essential (primary) hypertension 05/13/2014   History of cardiac catheterization 05/13/2014   HLD (hyperlipidemia) 05/13/2014   Combined fat and carbohydrate induced hyperlipemia 05/13/2014   History of cardiovascular surgery 05/13/2014   History of anticoagulant therapy 04/28/2014   Warfarin anticoagulation 04/28/2014   Benign prostatic hyperplasia with urinary obstruction 11/05/2013    REFERRING DIAG: LBP  THERAPY DIAG:  Unsteadiness on feet  Other low back pain  Difficulty in walking, not elsewhere classified  Rationale for Evaluation and Treatment Rehabilitation  PERTINENT HISTORY: LBP, balance. Pt fell 02/17/2022 while trying to step up onto his half step to get into his home in the garage. Pt tripped on the rug which is no longer there. Pt fractured L hip S/P L hip hemiarthroplasty on 02/19/2022. Pt underwent home health PT for the hip rehab. Pt fell again a couple of weeks after participating in rehab resulting in lumbar fracture. Does not remember what caused the fall. Pt was doing something in the hall. Pt does not remember if he was using an AD. Pt had a shot in his back which took away the pain. Pt started using his SPC a couple of weeks ago. Home health PT ended last week. Independent ambulation prior to L hip fracture.  PRECAUTIONS: fall risk,   SUBJECTIVE: Got a shot yesterday for his back. Had an x-ray, no broken bones, but arthritis. No falls recently. 2-3/10 low back pain at most for the past 7 days (including days before most recent cortisone shot for his back). Balance feels better.   PAIN:  Are you having pain? 0/10    TODAY'S TREATMENT:   Therapeutic exerise   Sit <> stand from regular chair with arms with B UE assist to stand, no UE assist to sit 5x2  Stepping over 4 mini hurdles with NBQC, CGA 2x   SLS with contralateral UE assist   R 10x5 seconds for 3  sets  L 10x5 seconds for 3 sets   Minimal to no UE assist during the 3rd set. Cues for placing and maintaining his body weight over his stance foot.    Stepping over 4 mini hurdles with very minimal to no assist from his NBQC, CGA 2x2   Improved ability to negotiate mini hurdles after SLS exercise.   Forward step up onto and over 4 inch step with using NBQC 4x  Then with minimal to no use of NBQC 4x   Difficulty with L LE single stance balance while steppping off onto his L LE from the step.    Standing hip abduction with  B UE assist                 Yellow band around ankles                          R 10x2                         L 10x2   Gait from gym to vehicle without use of AD. CGA    Improved exercise technique, movement at target joints, use of target muscles after mod verbal, visual, tactile cues.        Response to treatment Pt tolerated session well without aggravation of symptoms.      Clinical impression  Pt overall making very good progress with decreased low back pain based on subjective reports of 2-3/10 at worst for the past 7 days even before his cortisone shot for his low back. Difficulty with L LE single leg stance balance in which L glute med muscle weakness may play a factor. Continued working on hip strength and placing and maintaining his center of gravity over his base of support to promote ability to negotiate obstacles with less difficulty. Pt tolerated session well without aggravation of symptoms.  Pt will benefit from continued skilled physical therapy services to decrease pain, improve strength, balance and function.         PATIENT EDUCATION: Education details: there-ex, HEP Person educated: Patient Education method: Explanation, Demonstration, Tactile cues, Verbal cues, and Handouts Education comprehension: verbalized understanding and returned demonstration   HOME EXERCISE PROGRAM:       Access Code: [email or web address] URL:  [email or web address] Date: 07/05/2022 Prepared by: Joneen Boers    Exercises - Standing Hip Abduction with Counter Support  - 2 x daily - 7 x weekly - 1 sets - 10 reps - 5 seconds hold - Sit to Stand with Armchair  - 1 x daily - 3 x weekly - 2 sets - 8 reps - Scapular Retraction with Resistance  - 1 x daily - 3 x weekly - 3 sets - 12 reps The HEP added last session (07/19/2022) per pt: sit <> stand, stepping over obstacles, walking forward and back - Shoulder extension with resistance - Neutral  - 1 x daily - 7 x weekly - 1 sets - 10 reps - 5 seconds hold (RED BAND) - Standing Single Leg Stance with Unilateral Counter Support  - 1 x daily - 7 x weekly - 3 sets - 10 reps - 5 seconds hold     PT Short Term Goals - 08/10/22 1019       PT SHORT TERM GOAL #1   Title Pt will be independent with his initial HEP to decrease pain, improve strength, balance, and function.    Baseline Pt has started his initial HEP (07/05/2022); 08/10/2022 reports independence compelting as prescribed.    Time 3    Period Weeks    Status Achieved    Target Date 08/10/22              PT Long Term Goals - 11/03/22 1152       PT LONG TERM GOAL #1   Title Patient will have a decrease in low back pain to 4/10 or less at worst to promote ability to perform standing tasks, ambulate, and negotiate stairs with less difficulty.    Baseline 10/10 low back pain at worst for the past 3 months (07/05/2022); denies  back pain with standing, walking or stair negotiation; 4/10 at worst for the past 7 days (09/05/2022)    Time 8    Period Weeks    Status Achieved    Target Date 08/10/22      PT LONG TERM GOAL #2   Title Patient will improve his lumbar FOTO score by at least 10 points as a demonstration of improved function.    Baseline Lumbar spine FOTO 40 (07/05/2022); 52    Time 8    Period Weeks    Status Achieved    Target Date 08/10/22      PT LONG TERM GOAL #3   Title Pt will improve his TUG  score to 12 seconds or less without SPC as a demonstration of improved balance and functional mobility.    Baseline 18.33 seconds average with SPC on R side (07/05/2022); 22.05 sec  with SPC on RUE; 09/05/2022); 15 seconds average with SPC (10/04/2022); 16.2 seconds average with his NBQC (11/03/2022)    Time 6    Period Weeks    Status Partially Met    Target Date 12/15/22      PT LONG TERM GOAL #4   Title Pt will improve bilateral bilateral hip extension and abduction to promote ability to perform standing tasks more steadily.    Baseline Hip extension 4+/5 R and L, hip abduction 4+/5 R, 4/5 L (07/05/2022); deferred for primary PT; hip extension 5/5 R and L, hip abduction 5/5 R and L (09/05/2022)    Time 8    Period Weeks    Status Achieved    Target Date 09/01/22      PT LONG TERM GOAL #5   Title Pt will improve 5xSTS without UE support to 14.8 sec or less to demonstrate clinically significant improvement for age matched norms for LE strength and reduced risk of falls.    Baseline 08/10/22: 27.29 with UE's on thighs standard height chair; B UE assist 14.18 seconds (09/05/2022); 13.50 seconds (10/04/2022)    Time 8    Period Weeks    Status Achieved    Target Date 11/03/22      PT LONG TERM GOAL #6   Title Pt will improved DGI score to > 19 points as a demonstration of improved balance.    Baseline DGI 17 points; 18 (11/03/2022)    Time 6    Period Weeks    Status On-going    Target Date 12/15/22              Plan - 11/24/22 1113     Clinical Impression Statement Pt overall making very good progress with decreased low back pain based on subjective reports of 2-3/10 at worst for the past 7 days even before his cortisone shot for his low back. Difficulty with L LE single leg stance balance in which L glute med muscle weakness may play a factor. Continued working on hip strength and placing and maintaining his center of gravity over his base of support to promote ability to negotiate  obstacles with less difficulty. Pt tolerated session well without aggravation of symptoms.  Pt will benefit from continued skilled physical therapy services to decrease pain, improve strength, balance and function.    Personal Factors and Comorbidities Age;Comorbidity 3+;Past/Current Experience;Time since onset of injury/illness/exacerbation    Comorbidities A-fib, arthritis, basal cell CA, chronic kidney disease, dysrhythmia, HTN, MI, phlebitis, L hip hemiarthroplasty    Examination-Activity Limitations Stairs;Stand;Bend;Locomotion Level;Transfers    Stability/Clinical Decision Making Stable/Uncomplicated  Rehab Potential Fair    PT Frequency 2x / week    PT Duration 6 weeks    PT Treatment/Interventions Therapeutic exercise;Therapeutic activities;Stair training;Functional mobility training;Balance training;Neuromuscular re-education;Patient/family education;Manual techniques;Dry needling;Aquatic Therapy;Electrical Stimulation;Iontophoresis 76m/ml Dexamethasone;Gait training    PT Next Visit Plan posture, thoracic and hip extension, trunk and hip strengthening, manual techniques, modalities PRN    PT Home Exercise Plan Medbridge Access Code: [email or web address]   Consulted and Agree with Plan of Care Patient                                MJoneen BoersPT, DPT   11/24/2022, 11:16 AM

## 2022-11-28 ENCOUNTER — Ambulatory Visit: Payer: Medicare Other | Attending: Internal Medicine

## 2022-11-28 DIAGNOSIS — R2681 Unsteadiness on feet: Secondary | ICD-10-CM | POA: Insufficient documentation

## 2022-11-28 DIAGNOSIS — M5459 Other low back pain: Secondary | ICD-10-CM | POA: Insufficient documentation

## 2022-11-28 DIAGNOSIS — R262 Difficulty in walking, not elsewhere classified: Secondary | ICD-10-CM | POA: Diagnosis present

## 2022-11-28 NOTE — Therapy (Signed)
OUTPATIENT PHYSICAL THERAPY TREATMENT NOTE    Patient Name: Robert Rivas MRN: [id number] DOB:[birth year], 81 y.o., male Today's Date: 11/28/2022  PCP: Idelle Crouch, MD REFERRING PROVIDER: Idelle Crouch, MD   PT End of Session - 11/28/22 [id number]     Visit Number 32    Number of Visits 45    Date for PT Re-Evaluation 12/15/22    Authorization Type [employer] Medicare Primary; [employer] sceondary    Authorization Time Period 11/03/22-12/15/22    Progress Note Due on Visit 40    PT Start Time [id number]    PT Stop Time 1015    PT Time Calculation (min) 33 min    Activity Tolerance Patient tolerated treatment well;No increased pain    Behavior During Therapy WFL for tasks assessed/performed                                    Past Medical History:  Diagnosis Date   A-fib [facility])    Arthritis    lower back   Basal cell carcinoma 10/29/2020   Right post base of skull/neck   BPH (benign prostatic hyperplasia)    Cancer (HCC)    Skin Cancer   Chronic kidney disease    Chronic Kidney Disease   Coronary artery disease    Dysrhythmia    Atrial Fibrillation; Supraventricular Tachycardia   Gout    Hearing aid worn    has, does not wear   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   L upper back 5.0cm inf to base of neck, 5.0cm lat to spine   Hyperlipidemia    Hypertension    MI, old    Nocturia    Phlebitis    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   SCC IS R foreearm distal   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   SCC IS R forearm proximal   Squamous cell carcinoma of skin 03/26/2018   SCC IS R forearm   Squamous cell carcinoma of skin 01/01/2008   SCC IS R forearm   Wears dentures    partial   Past Surgical History:  Procedure Laterality Date   cardaic stents     CARDIAC CATHETERIZATION     PTCA with Stent Placement   CARDIOVERSION N/A 04/13/2021   Procedure: CARDIOVERSION;  Surgeon: Corey Skains, MD;  Location: [facility] ORS;  Service:  Cardiovascular;  Laterality: N/A;   CARDIOVERSION N/A 05/05/2021   Procedure: CARDIOVERSION;  Surgeon: Corey Skains, MD;  Location: [facility] ORS;  Service: Cardiovascular;  Laterality: N/A;   COLONOSCOPY WITH PROPOFOL N/A 12/15/2015   Procedure: COLONOSCOPY WITH PROPOFOL;  Surgeon: Lollie Sails, MD;  Location: [facility] ENDOSCOPY;  Service: Endoscopy;  Laterality: N/A;   CORONARY ARTERY BYPASS GRAFT  1992   ECTROPION REPAIR Right 12/12/2017   Procedure: REPAIR OF ECTROPION SUTURES/EXTENSIVE RIGHT;  Surgeon: Karle Starch, MD;  Location: [place];  Service: Ophthalmology;  Laterality: Right;   HERNIA REPAIR     HIP ARTHROPLASTY Left 02/19/2022   Procedure: ARTHROPLASTY BIPOLAR HIP (HEMIARTHROPLASTY);  Surgeon: Thornton Park, MD;  Location: [facility] ORS;  Service: Orthopedics;  Laterality: Left;   IR KYPHO LUMBAR INC FX REDUCE BONE BX UNI/BIL CANNULATION INC/IMAGING  06/07/2022   IR RADIOLOGIST EVAL & MGMT  06/30/2022   SVT ABLATION  02/22/2017   [facility]   Patient Active Problem List   Diagnosis Date Noted   Acute  urinary retention 02/19/2022   Acute delirium 02/18/2022   Dysphagia 02/18/2022   Hyponatremia 02/15/2022   Closed left hip fracture ([place]) 02/14/2022   Atrial fibrillation with RVR ([place]) 04/12/2021   Bradycardia 07/08/2019   Venous insufficiency of both lower extremities 07/08/2019   Abnormal glucose 01/10/2019   Elevated PSA 01/10/2019   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   Palpitations 06/13/2018   Leg edema, right 09/20/2017   History of BPH 02/21/2017   BPH (benign prostatic hyperplasia) 04/25/2016   Nocturia 04/25/2016   Gout 03/25/2016   History of colon polyps 03/25/2016   Inflammation of a vein 03/25/2016   Percutaneous transluminal coronary angioplasty status 03/25/2016   Supraventricular tachycardia 03/25/2016   Benign essential HTN 06/10/2015   Paroxysmal atrial fibrillation ([place]) 01/20/2015   Atrial fibrillation ([place]) 05/13/2014   Coronary artery disease  05/13/2014   Chest pain 05/13/2014   Stage 3a chronic kidney disease (CKD) ([place]) 05/13/2014   Essential (primary) hypertension 05/13/2014   History of cardiac catheterization 05/13/2014   HLD (hyperlipidemia) 05/13/2014   Combined fat and carbohydrate induced hyperlipemia 05/13/2014   History of cardiovascular surgery 05/13/2014   History of anticoagulant therapy 04/28/2014   Warfarin anticoagulation 04/28/2014   Benign prostatic hyperplasia with urinary obstruction 11/05/2013    REFERRING DIAG: LBP  THERAPY DIAG:  Unsteadiness on feet  Other low back pain  Difficulty in walking, not elsewhere classified  Rationale for Evaluation and Treatment Rehabilitation  PERTINENT HISTORY: LBP, balance. Pt fell 02/17/2022 while trying to step up onto his half step to get into his home in the garage. Pt tripped on the rug which is no longer there. Pt fractured L hip S/P L hip hemiarthroplasty on 02/19/2022. Pt underwent home health PT for the hip rehab. Pt fell again a couple of weeks after participating in rehab resulting in lumbar fracture. Does not remember what caused the fall. Pt was doing something in the hall. Pt does not remember if he was using an AD. Pt had a shot in his back which took away the pain. Pt started using his SPC a couple of weeks ago. Home health PT ended last week. Independent ambulation prior to L hip fracture.  PRECAUTIONS: fall risk,   SUBJECTIVE:  Had an x-ray, no broken bones, but arthritis. No back pain currently. Going to his daughter's house in [place] this Christmas. 10 steps to get to 2nd floor but pt staying down stairs, B rails.     PAIN:  Are you having pain? 0/10    TODAY'S TREATMENT:   Therapeutic exerise   Sit <> stand from regular chair with arms with B UE assist to stand, no UE assist to sit 5x2  Improving B functional LE strength observed.   Standing hip abduction with B UE assist                 red band around ankles                           R 5x6                         L 5x6  Standing hip extension with B UE assist   Red band around ankles   R 5x4   L 5x4  Forward step ups onto first regular step with B UE assist   R 10x  L 10x   Ascending and descending 4 regular steps with  B UE assist 3x2   SLS with contralateral UE assist   R 10x5 seconds  L 10x5 seconds  Gait from gym to vehicle without use of AD. Use of AD when stepping down a curb, CGA    Improved exercise technique, movement at target joints, use of target muscles after mod verbal, visual, tactile cues.        Response to treatment Pt tolerated session well without aggravation of symptoms.      Clinical impression  Worked on glute med and max strength to decrease stress to low back while performing standing tasks as well as to improve ability to negotiate obstacles. Worked on LE strengthening to promote ability to negotiate stairs with less difficulty when visiting his daughter for Christmas. Pt tolerated session well without aggravation of symptoms.  Pt will benefit from continued skilled physical therapy services to decrease pain, improve strength, balance and function.         PATIENT EDUCATION: Education details: there-ex, HEP Person educated: Patient Education method: Explanation, Demonstration, Tactile cues, Verbal cues, and Handouts Education comprehension: verbalized understanding and returned demonstration   HOME EXERCISE PROGRAM:       Access Code: [email or web address] URL: [email or web address] Date: 07/05/2022 Prepared by: Joneen Boers    Exercises - Standing Hip Abduction with Counter Support  - 2 x daily - 7 x weekly - 1 sets - 10 reps - 5 seconds hold - Sit to Stand with Armchair  - 1 x daily - 3 x weekly - 2 sets - 8 reps - Scapular Retraction with Resistance  - 1 x daily - 3 x weekly - 3 sets - 12 reps The HEP added last session (07/19/2022) per pt: sit <> stand, stepping over obstacles, walking forward and  back - Shoulder extension with resistance - Neutral  - 1 x daily - 7 x weekly - 1 sets - 10 reps - 5 seconds hold (RED BAND) - Standing Single Leg Stance with Unilateral Counter Support  - 1 x daily - 7 x weekly - 3 sets - 10 reps - 5 seconds hold     PT Short Term Goals - 08/10/22 1019       PT SHORT TERM GOAL #1   Title Pt will be independent with his initial HEP to decrease pain, improve strength, balance, and function.    Baseline Pt has started his initial HEP (07/05/2022); 08/10/2022 reports independence compelting as prescribed.    Time 3    Period Weeks    Status Achieved    Target Date 08/10/22              PT Long Term Goals - 11/03/22 1152       PT LONG TERM GOAL #1   Title Patient will have a decrease in low back pain to 4/10 or less at worst to promote ability to perform standing tasks, ambulate, and negotiate stairs with less difficulty.    Baseline 10/10 low back pain at worst for the past 3 months (07/05/2022); denies back pain with standing, walking or stair negotiation; 4/10 at worst for the past 7 days (09/05/2022)    Time 8    Period Weeks    Status Achieved    Target Date 08/10/22      PT LONG TERM GOAL #2   Title Patient will improve his lumbar FOTO score by at least 10 points as a demonstration of improved function.    Baseline Lumbar spine FOTO 40 (07/05/2022); 52    Time  8    Period Weeks    Status Achieved    Target Date 08/10/22      PT LONG TERM GOAL #3   Title Pt will improve his TUG score to 12 seconds or less without SPC as a demonstration of improved balance and functional mobility.    Baseline 18.33 seconds average with SPC on R side (07/05/2022); 22.05 sec  with SPC on RUE; 09/05/2022); 15 seconds average with SPC (10/04/2022); 16.2 seconds average with his NBQC (11/03/2022)    Time 6    Period Weeks    Status Partially Met    Target Date 12/15/22      PT LONG TERM GOAL #4   Title Pt will improve bilateral bilateral hip extension and  abduction to promote ability to perform standing tasks more steadily.    Baseline Hip extension 4+/5 R and L, hip abduction 4+/5 R, 4/5 L (07/05/2022); deferred for primary PT; hip extension 5/5 R and L, hip abduction 5/5 R and L (09/05/2022)    Time 8    Period Weeks    Status Achieved    Target Date 09/01/22      PT LONG TERM GOAL #5   Title Pt will improve 5xSTS without UE support to 14.8 sec or less to demonstrate clinically significant improvement for age matched norms for LE strength and reduced risk of falls.    Baseline 08/10/22: 27.29 with UE's on thighs standard height chair; B UE assist 14.18 seconds (09/05/2022); 13.50 seconds (10/04/2022)    Time 8    Period Weeks    Status Achieved    Target Date 11/03/22      PT LONG TERM GOAL #6   Title Pt will improved DGI score to > 19 points as a demonstration of improved balance.    Baseline DGI 17 points; 18 (11/03/2022)    Time 6    Period Weeks    Status On-going    Target Date 12/15/22              Plan - 11/28/22 0944     Clinical Impression Statement Worked on glute med and max strength to decrease stress to low back while performing standing tasks as well as to improve ability to negotiate obstacles. Worked on LE strengthening to promote ability to negotiate stairs with less difficulty when visiting his daughter for Christmas. Pt tolerated session well without aggravation of symptoms.  Pt will benefit from continued skilled physical therapy services to decrease pain, improve strength, balance and function.    Personal Factors and Comorbidities Age;Comorbidity 3+;Past/Current Experience;Time since onset of injury/illness/exacerbation    Comorbidities A-fib, arthritis, basal cell CA, chronic kidney disease, dysrhythmia, HTN, MI, phlebitis, L hip hemiarthroplasty    Examination-Activity Limitations Stairs;Stand;Bend;Locomotion Level;Transfers    Stability/Clinical Decision Making Stable/Uncomplicated    Clinical Decision Making  Low    Rehab Potential Fair    PT Frequency 2x / week    PT Duration 6 weeks    PT Treatment/Interventions Therapeutic exercise;Therapeutic activities;Stair training;Functional mobility training;Balance training;Neuromuscular re-education;Patient/family education;Manual techniques;Dry needling;Aquatic Therapy;Electrical Stimulation;Iontophoresis 46m/ml Dexamethasone;Gait training    PT Next Visit Plan posture, thoracic and hip extension, trunk and hip strengthening, manual techniques, modalities PRN    PT Home Exercise Plan Medbridge Access Code: [email or web address]   Consulted and Agree with Plan of Care Patient  [address] PT, DPT   11/28/2022, 3:14 PM

## 2022-12-01 ENCOUNTER — Ambulatory Visit: Payer: Medicare Other

## 2022-12-01 DIAGNOSIS — R2681 Unsteadiness on feet: Secondary | ICD-10-CM

## 2022-12-01 DIAGNOSIS — R262 Difficulty in walking, not elsewhere classified: Secondary | ICD-10-CM

## 2022-12-01 DIAGNOSIS — M5459 Other low back pain: Secondary | ICD-10-CM

## 2022-12-01 NOTE — Therapy (Signed)
OUTPATIENT PHYSICAL THERAPY TREATMENT NOTE    Patient Name: Robert Rivas MRN: [id number] DOB:06-26-1941, 81 y.o., male Today's Date: 12/01/2022  PCP: Idelle Crouch, MD REFERRING PROVIDER: Idelle Crouch, MD   PT End of Session - 12/01/22 0846     Visit Number 33    Number of Visits 45    Date for PT Re-Evaluation 12/15/22    Authorization Type [employer] Medicare Primary; [employer] sceondary    Authorization Time Period 11/03/22-12/15/22    Progress Note Due on Visit 40    PT Start Time 0846    PT Stop Time 0925    PT Time Calculation (min) 39 min    Activity Tolerance Patient tolerated treatment well;No increased pain    Behavior During Therapy WFL for tasks assessed/performed                                     Past Medical History:  Diagnosis Date   A-fib [facility])    Arthritis    lower back   Basal cell carcinoma 10/29/2020   Right post base of skull/neck   BPH (benign prostatic hyperplasia)    Cancer (HCC)    Skin Cancer   Chronic kidney disease    Chronic Kidney Disease   Coronary artery disease    Dysrhythmia    Atrial Fibrillation; Supraventricular Tachycardia   Gout    Hearing aid worn    has, does not wear   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   L upper back 5.0cm inf to base of neck, 5.0cm lat to spine   Hyperlipidemia    Hypertension    MI, old    Nocturia    Phlebitis    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   SCC IS R foreearm distal   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   SCC IS R forearm proximal   Squamous cell carcinoma of skin 03/26/2018   SCC IS R forearm   Squamous cell carcinoma of skin 01/01/2008   SCC IS R forearm   Wears dentures    partial   Past Surgical History:  Procedure Laterality Date   cardaic stents     CARDIAC CATHETERIZATION     PTCA with Stent Placement   CARDIOVERSION N/A 04/13/2021   Procedure: CARDIOVERSION;  Surgeon: Corey Skains, MD;  Location: [facility] ORS;   Service: Cardiovascular;  Laterality: N/A;   CARDIOVERSION N/A 05/05/2021   Procedure: CARDIOVERSION;  Surgeon: Corey Skains, MD;  Location: [facility] ORS;  Service: Cardiovascular;  Laterality: N/A;   COLONOSCOPY WITH PROPOFOL N/A 12/15/2015   Procedure: COLONOSCOPY WITH PROPOFOL;  Surgeon: Lollie Sails, MD;  Location: [facility] ENDOSCOPY;  Service: Endoscopy;  Laterality: N/A;   CORONARY ARTERY BYPASS GRAFT  1992   ECTROPION REPAIR Right 12/12/2017   Procedure: REPAIR OF ECTROPION SUTURES/EXTENSIVE RIGHT;  Surgeon: Karle Starch, MD;  Location: [place];  Service: Ophthalmology;  Laterality: Right;   HERNIA REPAIR     HIP ARTHROPLASTY Left 02/19/2022   Procedure: ARTHROPLASTY BIPOLAR HIP (HEMIARTHROPLASTY);  Surgeon: Thornton Park, MD;  Location: [facility] ORS;  Service: Orthopedics;  Laterality: Left;   IR KYPHO LUMBAR INC FX REDUCE BONE BX UNI/BIL CANNULATION INC/IMAGING  06/07/2022   IR RADIOLOGIST EVAL & MGMT  06/30/2022   SVT ABLATION  02/22/2017   [facility]   Patient Active Problem List   Diagnosis Date Noted  Acute urinary retention 02/19/2022   Acute delirium 02/18/2022   Dysphagia 02/18/2022   Hyponatremia 02/15/2022   Closed left hip fracture ([place]) 02/14/2022   Atrial fibrillation with RVR ([place]) 04/12/2021   Bradycardia 07/08/2019   Venous insufficiency of both lower extremities 07/08/2019   Abnormal glucose 01/10/2019   Elevated PSA 01/10/2019   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   Palpitations 06/13/2018   Leg edema, right 09/20/2017   History of BPH 02/21/2017   BPH (benign prostatic hyperplasia) 04/25/2016   Nocturia 04/25/2016   Gout 03/25/2016   History of colon polyps 03/25/2016   Inflammation of a vein 03/25/2016   Percutaneous transluminal coronary angioplasty status 03/25/2016   Supraventricular tachycardia 03/25/2016   Benign essential HTN 06/10/2015   Paroxysmal atrial fibrillation ([place]) 01/20/2015   Atrial fibrillation ([place]) 05/13/2014   Coronary artery  disease 05/13/2014   Chest pain 05/13/2014   Stage 3a chronic kidney disease (CKD) ([place]) 05/13/2014   Essential (primary) hypertension 05/13/2014   History of cardiac catheterization 05/13/2014   HLD (hyperlipidemia) 05/13/2014   Combined fat and carbohydrate induced hyperlipemia 05/13/2014   History of cardiovascular surgery 05/13/2014   History of anticoagulant therapy 04/28/2014   Warfarin anticoagulation 04/28/2014   Benign prostatic hyperplasia with urinary obstruction 11/05/2013    REFERRING DIAG: LBP  THERAPY DIAG:  Unsteadiness on feet  Other low back pain  Difficulty in walking, not elsewhere classified  Rationale for Evaluation and Treatment Rehabilitation  PERTINENT HISTORY: LBP, balance. Pt fell 02/17/2022 while trying to step up onto his half step to get into his home in the garage. Pt tripped on the rug which is no longer there. Pt fractured L hip S/P L hip hemiarthroplasty on 02/19/2022. Pt underwent home health PT for the hip rehab. Pt fell again a couple of weeks after participating in rehab resulting in lumbar fracture. Does not remember what caused the fall. Pt was doing something in the hall. Pt does not remember if he was using an AD. Pt had a shot in his back which took away the pain. Pt started using his SPC a couple of weeks ago. Home health PT ended last week. Independent ambulation prior to L hip fracture.  PRECAUTIONS: fall risk,   SUBJECTIVE: No back pain currently.      PAIN:  Are you having pain? 0/10    TODAY'S TREATMENT:   Therapeutic exerise   Sit <> stand from regular chair with arms with B UE assist to stand, no UE assist to sit 5x2  Improving B functional LE strength observed.  No "flopping" when going from stand to sit observed. Improved eccentric strength and control observed.    Standing hip abduction with B UE assist                 red band around ankles                          R 10x3                         L 10x3   Easy per  pt. Increase to green band next visit if appropriate.   Stepping over shoe box with black foam block on top (about 8 inches) with AD assist  R 5x3  L 5x3    Cues for increased hip and knee flexion, step length for foot clearance and forward weight shifting onto stance foot for maintaining balance.  Able to perform well after cues  Forward step up onto Dyna Disc with B UE assist and CGA  R 10x2  L 10x2   Forward step up onto and over Dyna Disc with contralateral UE assist   R 5x2  L 5x2   Gait from gym to vehicle without use of AD. No AD when walking over small patch of much for uneven surface. Use of AD when stepping down a curb, CGA    Improved exercise technique, movement at target joints, use of target muscles after mod verbal, visual, tactile cues.        Response to treatment Pt tolerated session well without aggravation of symptoms.      Clinical impression  Pt able to step over 8 inch high obstacle with NBQC assist today. Improving eccentric B quadriceps strength with stand to sit observed. Added step ups onto Dyna Disc to challenge LE stability and balance (but with B UE assist for now) to help decrease fall risk. Pt tolerated session well without aggravation of symptoms.  Pt will benefit from continued skilled physical therapy services to decrease pain, improve strength, balance and function.         PATIENT EDUCATION: Education details: there-ex, HEP Person educated: Patient Education method: Explanation, Demonstration, Tactile cues, Verbal cues, and Handouts Education comprehension: verbalized understanding and returned demonstration   HOME EXERCISE PROGRAM:       Access Code: [email or web address] URL: [email or web address] Date: 07/05/2022 Prepared by: Joneen Boers    Exercises - Standing Hip Abduction with Counter Support  - 2 x daily - 7 x weekly - 1 sets - 10 reps - 5 seconds hold - Sit to Stand with Armchair  - 1 x daily - 3 x weekly - 2  sets - 8 reps - Scapular Retraction with Resistance  - 1 x daily - 3 x weekly - 3 sets - 12 reps The HEP added last session (07/19/2022) per pt: sit <> stand, stepping over obstacles, walking forward and back - Shoulder extension with resistance - Neutral  - 1 x daily - 7 x weekly - 1 sets - 10 reps - 5 seconds hold (RED BAND) - Standing Single Leg Stance with Unilateral Counter Support  - 1 x daily - 7 x weekly - 3 sets - 10 reps - 5 seconds hold     PT Short Term Goals - 08/10/22 1019       PT SHORT TERM GOAL #1   Title Pt will be independent with his initial HEP to decrease pain, improve strength, balance, and function.    Baseline Pt has started his initial HEP (07/05/2022); 08/10/2022 reports independence compelting as prescribed.    Time 3    Period Weeks    Status Achieved    Target Date 08/10/22              PT Long Term Goals - 11/03/22 1152       PT LONG TERM GOAL #1   Title Patient will have a decrease in low back pain to 4/10 or less at worst to promote ability to perform standing tasks, ambulate, and negotiate stairs with less difficulty.    Baseline 10/10 low back pain at worst for the past 3 months (07/05/2022); denies back pain with standing, walking or stair negotiation; 4/10 at worst for the past 7 days (09/05/2022)    Time 8    Period Weeks    Status Achieved    Target Date 08/10/22  PT LONG TERM GOAL #2   Title Patient will improve his lumbar FOTO score by at least 10 points as a demonstration of improved function.    Baseline Lumbar spine FOTO 40 (07/05/2022); 52    Time 8    Period Weeks    Status Achieved    Target Date 08/10/22      PT LONG TERM GOAL #3   Title Pt will improve his TUG score to 12 seconds or less without SPC as a demonstration of improved balance and functional mobility.    Baseline 18.33 seconds average with SPC on R side (07/05/2022); 22.05 sec  with SPC on RUE; 09/05/2022); 15 seconds average with SPC (10/04/2022); 16.2 seconds  average with his NBQC (11/03/2022)    Time 6    Period Weeks    Status Partially Met    Target Date 12/15/22      PT LONG TERM GOAL #4   Title Pt will improve bilateral bilateral hip extension and abduction to promote ability to perform standing tasks more steadily.    Baseline Hip extension 4+/5 R and L, hip abduction 4+/5 R, 4/5 L (07/05/2022); deferred for primary PT; hip extension 5/5 R and L, hip abduction 5/5 R and L (09/05/2022)    Time 8    Period Weeks    Status Achieved    Target Date 09/01/22      PT LONG TERM GOAL #5   Title Pt will improve 5xSTS without UE support to 14.8 sec or less to demonstrate clinically significant improvement for age matched norms for LE strength and reduced risk of falls.    Baseline 08/10/22: 27.29 with UE's on thighs standard height chair; B UE assist 14.18 seconds (09/05/2022); 13.50 seconds (10/04/2022)    Time 8    Period Weeks    Status Achieved    Target Date 11/03/22      PT LONG TERM GOAL #6   Title Pt will improved DGI score to > 19 points as a demonstration of improved balance.    Baseline DGI 17 points; 18 (11/03/2022)    Time 6    Period Weeks    Status On-going    Target Date 12/15/22              Plan - 12/01/22 0844     Clinical Impression Statement Pt able to step over 8 inch high obstacle with NBQC assist today. Improving eccentric B quadriceps strength with stand to sit observed. Added step ups onto Dyna Disc to challenge LE stability and balance (but with B UE assist for now) to help decrease fall risk. Pt tolerated session well without aggravation of symptoms.  Pt will benefit from continued skilled physical therapy services to decrease pain, improve strength, balance and function.    Personal Factors and Comorbidities Age;Comorbidity 3+;Past/Current Experience;Time since onset of injury/illness/exacerbation    Comorbidities A-fib, arthritis, basal cell CA, chronic kidney disease, dysrhythmia, HTN, MI, phlebitis, L hip  hemiarthroplasty    Examination-Activity Limitations Stairs;Stand;Bend;Locomotion Level;Transfers    Stability/Clinical Decision Making Stable/Uncomplicated    Rehab Potential Fair    PT Frequency 2x / week    PT Duration 6 weeks    PT Treatment/Interventions Therapeutic exercise;Therapeutic activities;Stair training;Functional mobility training;Balance training;Neuromuscular re-education;Patient/family education;Manual techniques;Dry needling;Aquatic Therapy;Electrical Stimulation;Iontophoresis 39m/ml Dexamethasone;Gait training    PT Next Visit Plan posture, thoracic and hip extension, trunk and hip strengthening, manual techniques, modalities PRN    PT Home Exercise Plan Medbridge Access Code: [email or web address]    [email or web address]  and Agree with Plan of Care Patient                                 Joneen Boers PT, DPT   12/01/2022, 12:57 PM

## 2022-12-05 ENCOUNTER — Ambulatory Visit: Payer: Medicare Other

## 2022-12-05 DIAGNOSIS — R262 Difficulty in walking, not elsewhere classified: Secondary | ICD-10-CM

## 2022-12-05 DIAGNOSIS — R2681 Unsteadiness on feet: Secondary | ICD-10-CM

## 2022-12-05 DIAGNOSIS — M5459 Other low back pain: Secondary | ICD-10-CM

## 2022-12-05 NOTE — Therapy (Signed)
OUTPATIENT PHYSICAL THERAPY TREATMENT NOTE    Patient Name: Robert Rivas MRN: [id number] DOB:02/06/1941, 81 y.o., male Today's Date: 12/05/2022  PCP: Idelle Crouch, MD REFERRING PROVIDER: Idelle Crouch, MD   PT End of Session - 12/05/22 0933     Visit Number 34    Number of Visits 45    Date for PT Re-Evaluation 12/15/22    Authorization Type [employer] Medicare Primary; [employer] sceondary    Authorization Time Period 11/03/22-12/15/22    Progress Note Due on Visit 40    PT Start Time 0930    PT Stop Time 1010    PT Time Calculation (min) 40 min    Equipment Utilized During Treatment Gait belt    Activity Tolerance Patient tolerated treatment well;No increased pain    Behavior During Therapy WFL for tasks assessed/performed                  Past Medical History:  Diagnosis Date   A-fib [facility])    Arthritis    lower back   Basal cell carcinoma 10/29/2020   Right post base of skull/neck   BPH (benign prostatic hyperplasia)    Cancer (HCC)    Skin Cancer   Chronic kidney disease    Chronic Kidney Disease   Coronary artery disease    Dysrhythmia    Atrial Fibrillation; Supraventricular Tachycardia   Gout    Hearing aid worn    has, does not wear   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   L upper back 5.0cm inf to base of neck, 5.0cm lat to spine   Hyperlipidemia    Hypertension    MI, old    Nocturia    Phlebitis    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   SCC IS R foreearm distal   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   SCC IS R forearm proximal   Squamous cell carcinoma of skin 03/26/2018   SCC IS R forearm   Squamous cell carcinoma of skin 01/01/2008   SCC IS R forearm   Wears dentures    partial   Past Surgical History:  Procedure Laterality Date   cardaic stents     CARDIAC CATHETERIZATION     PTCA with Stent Placement   CARDIOVERSION N/A 04/13/2021   Procedure: CARDIOVERSION;  Surgeon: Corey Skains, MD;  Location: [facility]  ORS;  Service: Cardiovascular;  Laterality: N/A;   CARDIOVERSION N/A 05/05/2021   Procedure: CARDIOVERSION;  Surgeon: Corey Skains, MD;  Location: [facility] ORS;  Service: Cardiovascular;  Laterality: N/A;   COLONOSCOPY WITH PROPOFOL N/A 12/15/2015   Procedure: COLONOSCOPY WITH PROPOFOL;  Surgeon: Lollie Sails, MD;  Location: [facility] ENDOSCOPY;  Service: Endoscopy;  Laterality: N/A;   CORONARY ARTERY BYPASS GRAFT  1992   ECTROPION REPAIR Right 12/12/2017   Procedure: REPAIR OF ECTROPION SUTURES/EXTENSIVE RIGHT;  Surgeon: Karle Starch, MD;  Location: [place];  Service: Ophthalmology;  Laterality: Right;   HERNIA REPAIR     HIP ARTHROPLASTY Left 02/19/2022   Procedure: ARTHROPLASTY BIPOLAR HIP (HEMIARTHROPLASTY);  Surgeon: Thornton Park, MD;  Location: [facility] ORS;  Service: Orthopedics;  Laterality: Left;   IR KYPHO LUMBAR INC FX REDUCE BONE BX UNI/BIL CANNULATION INC/IMAGING  06/07/2022   IR RADIOLOGIST EVAL & MGMT  06/30/2022   SVT ABLATION  02/22/2017   [facility]   Patient Active Problem List   Diagnosis Date Noted   Acute urinary retention 02/19/2022   Acute delirium 02/18/2022  Dysphagia 02/18/2022   Hyponatremia 02/15/2022   Closed left hip fracture ([place]) 02/14/2022   Atrial fibrillation with RVR ([place]) 04/12/2021   Bradycardia 07/08/2019   Venous insufficiency of both lower extremities 07/08/2019   Abnormal glucose 01/10/2019   Elevated PSA 01/10/2019   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   Palpitations 06/13/2018   Leg edema, right 09/20/2017   History of BPH 02/21/2017   BPH (benign prostatic hyperplasia) 04/25/2016   Nocturia 04/25/2016   Gout 03/25/2016   History of colon polyps 03/25/2016   Inflammation of a vein 03/25/2016   Percutaneous transluminal coronary angioplasty status 03/25/2016   Supraventricular tachycardia 03/25/2016   Benign essential HTN 06/10/2015   Paroxysmal atrial fibrillation ([place]) 01/20/2015   Atrial fibrillation ([place]) 05/13/2014   Coronary artery  disease 05/13/2014   Chest pain 05/13/2014   Stage 3a chronic kidney disease (CKD) ([place]) 05/13/2014   Essential (primary) hypertension 05/13/2014   History of cardiac catheterization 05/13/2014   HLD (hyperlipidemia) 05/13/2014   Combined fat and carbohydrate induced hyperlipemia 05/13/2014   History of cardiovascular surgery 05/13/2014   History of anticoagulant therapy 04/28/2014   Warfarin anticoagulation 04/28/2014   Benign prostatic hyperplasia with urinary obstruction 11/05/2013    REFERRING DIAG: LBP  THERAPY DIAG:  Unsteadiness on feet  Other low back pain  Difficulty in walking, not elsewhere classified  Rationale for Evaluation and Treatment Rehabilitation  PERTINENT HISTORY: LBP, balance. Pt fell 02/17/2022 while trying to step up onto his half step to get into his home in the garage. Pt tripped on the rug which is no longer there. Pt fractured L hip S/P L hip hemiarthroplasty on 02/19/2022. Pt underwent home health PT for the hip rehab. Pt fell again a couple of weeks after participating in rehab resulting in lumbar fracture. Does not remember what caused the fall. Pt was doing something in the hall. Pt does not remember if he was using an AD. Pt had a shot in his back which took away the pain. Pt started using his SPC a couple of weeks ago. Home health PT ended last week. Independent ambulation prior to L hip fracture.  PRECAUTIONS: fall risk,   SUBJECTIVE: No updates, good weekend. HEP great. Feeling good.      PAIN:  Are you having pain? 0/10    TODAY'S TREATMENT 12/05/22 Sit <> stand from regular, cued hands on knees 1x7 (effort higher than desired by author) STS from chair + airex, cued hands on knees 1x10                    Standing hip abduction with BUE support                 GTB band around knees                          R 3x10                         L 3x10                          Obstacle Course Walking in Clinic for multifocal balance  training: -part 1: 159f fig 8 twice: foam balance beam, half roller balance beam, backward gait, 180 degree turnaround, over folded tumble mat with obstacles inside, 2 stepping stones per foot, circle around guest chair CW, CCW, then up/over 5" step.  -part 2: 4  orange hurdles, 134f fig 8 twice: foam balance beam, half roller balance beam, backwards gait, 180 degree turnaround,eyes closed gait x177f 2 sliders to simulate slick surface, over folded tumble mat with 4 obstacles inside, 2 stepping stones per foot, circle around guest chair CW, CCW, up/over 5" step. (all with 2lb AW bilat)  -1 additional lap of course prior to session end     Improved exercise technique, movement at target joints, use of target muscles after mod verbal, visual, tactile cues.        Response to treatment Pt tolerated session well without aggravation of symptoms.      Clinical impression  Composite knee/hip extension remains very weak, but improving. Able to advance overground gait-based balance training, emphasis on utility and fluency of QC use to improve safety in community navaigation. Pt tolerated session well without aggravation of symptoms.  Pt will benefit from continued skilled physical therapy services to decrease pain, improve strength, balance and function.         PATIENT EDUCATION: Education details: there-ex, HEP Person educated: Patient Education method: Explanation, Demonstration, Tactile cues, Verbal cues, and Handouts Education comprehension: verbalized understanding and returned demonstration   HOME EXERCISE PROGRAM:       Access Code: [email or web address] URL: [email or web address] Date: 07/05/2022 Prepared by: MiJoneen Boers  Exercises - Standing Hip Abduction with Counter Support  - 2 x daily - 7 x weekly - 1 sets - 10 reps - 5 seconds hold - Sit to Stand with Armchair  - 1 x daily - 3 x weekly - 2 sets - 8 reps - Scapular Retraction with Resistance  - 1 x daily - 3 x  weekly - 3 sets - 12 reps The HEP added last session (07/19/2022) per pt: sit <> stand, stepping over obstacles, walking forward and back - Shoulder extension with resistance - Neutral  - 1 x daily - 7 x weekly - 1 sets - 10 reps - 5 seconds hold (RED BAND) - Standing Single Leg Stance with Unilateral Counter Support  - 1 x daily - 7 x weekly - 3 sets - 10 reps - 5 seconds hold     PT Short Term Goals - 08/10/22 1019       PT SHORT TERM GOAL #1   Title Pt will be independent with his initial HEP to decrease pain, improve strength, balance, and function.    Baseline Pt has started his initial HEP (07/05/2022); 08/10/2022 reports independence compelting as prescribed.    Time 3    Period Weeks    Status Achieved    Target Date 08/10/22              PT Long Term Goals - 11/03/22 1152       PT LONG TERM GOAL #1   Title Patient will have a decrease in low back pain to 4/10 or less at worst to promote ability to perform standing tasks, ambulate, and negotiate stairs with less difficulty.    Baseline 10/10 low back pain at worst for the past 3 months (07/05/2022); denies back pain with standing, walking or stair negotiation; 4/10 at worst for the past 7 days (09/05/2022)    Time 8    Period Weeks    Status Achieved    Target Date 08/10/22      PT LONG TERM GOAL #2   Title Patient will improve his lumbar FOTO score by at least 10 points as a demonstration of improved function.    Baseline  Lumbar spine FOTO 40 (07/05/2022); 52    Time 8    Period Weeks    Status Achieved    Target Date 08/10/22      PT LONG TERM GOAL #3   Title Pt will improve his TUG score to 12 seconds or less without SPC as a demonstration of improved balance and functional mobility.    Baseline 18.33 seconds average with SPC on R side (07/05/2022); 22.05 sec  with SPC on RUE; 09/05/2022); 15 seconds average with SPC (10/04/2022); 16.2 seconds average with his NBQC (11/03/2022)    Time 6    Period Weeks    Status  Partially Met    Target Date 12/15/22      PT LONG TERM GOAL #4   Title Pt will improve bilateral bilateral hip extension and abduction to promote ability to perform standing tasks more steadily.    Baseline Hip extension 4+/5 R and L, hip abduction 4+/5 R, 4/5 L (07/05/2022); deferred for primary PT; hip extension 5/5 R and L, hip abduction 5/5 R and L (09/05/2022)    Time 8    Period Weeks    Status Achieved    Target Date 09/01/22      PT LONG TERM GOAL #5   Title Pt will improve 5xSTS without UE support to 14.8 sec or less to demonstrate clinically significant improvement for age matched norms for LE strength and reduced risk of falls.    Baseline 08/10/22: 27.29 with UE's on thighs standard height chair; B UE assist 14.18 seconds (09/05/2022); 13.50 seconds (10/04/2022)    Time 8    Period Weeks    Status Achieved    Target Date 11/03/22      PT LONG TERM GOAL #6   Title Pt will improved DGI score to > 19 points as a demonstration of improved balance.    Baseline DGI 17 points; 18 (11/03/2022)    Time 6    Period Weeks    Status On-going    Target Date 12/15/22              Plan - 12/05/22 1016     Clinical Impression Statement Composite knee/hip extension remains very weak, but improving. Able to advance overground gait-based balance training, emphasis on utility and fluency of QC use to improve safety in community navaigation. Pt tolerated session well without aggravation of symptoms.  Pt will benefit from continued skilled physical therapy services to decrease pain, improve strength, balance and function.    Personal Factors and Comorbidities Age;Comorbidity 3+;Past/Current Experience;Time since onset of injury/illness/exacerbation    Comorbidities A-fib, arthritis, basal cell CA, chronic kidney disease, dysrhythmia, HTN, MI, phlebitis, L hip hemiarthroplasty    Examination-Activity Limitations Stairs;Stand;Bend;Locomotion Level;Transfers    Stability/Clinical Decision  Making Stable/Uncomplicated    Clinical Decision Making Low    Rehab Potential Fair    PT Frequency 2x / week    PT Duration 6 weeks    PT Treatment/Interventions Therapeutic exercise;Therapeutic activities;Stair training;Functional mobility training;Balance training;Neuromuscular re-education;Patient/family education;Manual techniques;Dry needling;Aquatic Therapy;Electrical Stimulation;Iontophoresis 85m/ml Dexamethasone;Gait training    PT Next Visit Plan posture, thoracic and hip extension, trunk and hip strengthening, manual techniques, modalities PRN    PT Home Exercise Plan Medbridge Access Code: [email or web address]   Consulted and Agree with Plan of Care Patient                  10:17 AM, 12/05/22 AEtta Grandchild PT, DPT Physical Therapist - [phone](Office)  12/05/2022, 10:17 AM

## 2022-12-08 ENCOUNTER — Ambulatory Visit: Payer: Medicare Other

## 2022-12-08 DIAGNOSIS — M5459 Other low back pain: Secondary | ICD-10-CM

## 2022-12-08 DIAGNOSIS — R2681 Unsteadiness on feet: Secondary | ICD-10-CM

## 2022-12-08 DIAGNOSIS — R262 Difficulty in walking, not elsewhere classified: Secondary | ICD-10-CM

## 2022-12-08 NOTE — Therapy (Signed)
OUTPATIENT PHYSICAL THERAPY TREATMENT NOTE    Patient Name: Robert Rivas MRN: [id number] DOB:05/04/1941, 81 y.o., male Today's Date: 12/08/2022  PCP: Idelle Crouch, MD REFERRING PROVIDER: Idelle Crouch, MD   PT End of Session - 12/08/22 1150     Visit Number 35    Number of Visits 45    Date for PT Re-Evaluation 12/15/22    Authorization Type UHC Medicare Primary; Tric[employer] for Life sceondary    Authorization Time Period 11/03/22-12/15/22    Progress Note Due on Visit 40    PT Start Time 1150    PT Stop Time 1230    PT Time Calculation (min) 40 min    Equipment Utilized During Treatment Gait belt    Activity Tolerance Patient tolerated treatment well;No increased pain    Behavior During Therapy WFL for tasks assessed/performed                                      Past Medical History:  Diagnosis Date   A-fib [facility])    Arthritis    lower back   Basal cell carcinoma 10/29/2020   Right post base of skull/neck   BPH (benign prostatic hyperplasia)    Cancer (HCC)    Skin Cancer   Chronic kidney disease    Chronic Kidney Disease   Coronary artery disease    Dysrhythmia    Atrial Fibrillation; Supraventricular Tachycardia   Gout    Hearing aid worn    has, does not wear   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   L upper back 5.0cm inf to base of neck, 5.0cm lat to spine   Hyperlipidemia    Hypertension    MI, old    Nocturia    Phlebitis    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   SCC IS R foreearm distal   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   SCC IS R forearm proximal   Squamous cell carcinoma of skin 03/26/2018   SCC IS R forearm   Squamous cell carcinoma of skin 01/01/2008   SCC IS R forearm   Wears dentures    partial   Past Surgical History:  Procedure Laterality Date   cardaic stents     CARDIAC CATHETERIZATION     PTCA with Stent Placement   CARDIOVERSION N/A 04/13/2021   Procedure: CARDIOVERSION;  Surgeon:  Corey Skains, MD;  Location: [facility] ORS;  Service: Cardiovascular;  Laterality: N/A;   CARDIOVERSION N/A 05/05/2021   Procedure: CARDIOVERSION;  Surgeon: Corey Skains, MD;  Location: [facility] ORS;  Service: Cardiovascular;  Laterality: N/A;   COLONOSCOPY WITH PROPOFOL N/A 12/15/2015   Procedure: COLONOSCOPY WITH PROPOFOL;  Surgeon: Lollie Sails, MD;  Location: [facility] ENDOSCOPY;  Service: Endoscopy;  Laterality: N/A;   CORONARY ARTERY BYPASS GRAFT  1992   ECTROPION REPAIR Right 12/12/2017   Procedure: REPAIR OF ECTROPION SUTURES/EXTENSIVE RIGHT;  Surgeon: Karle Starch, MD;  Location: [place];  Service: Ophthalmology;  Laterality: Right;   HERNIA REPAIR     HIP ARTHROPLASTY Left 02/19/2022   Procedure: ARTHROPLASTY BIPOLAR HIP (HEMIARTHROPLASTY);  Surgeon: Thornton Park, MD;  Location: [facility] ORS;  Service: Orthopedics;  Laterality: Left;   IR KYPHO LUMBAR INC FX REDUCE BONE BX UNI/BIL CANNULATION INC/IMAGING  06/07/2022   IR RADIOLOGIST EVAL & MGMT  06/30/2022   SVT ABLATION  02/22/2017   [facility]   Patient  Active Problem List   Diagnosis Date Noted   Acute urinary retention 02/19/2022   Acute delirium 02/18/2022   Dysphagia 02/18/2022   Hyponatremia 02/15/2022   Closed left hip fracture ([place]) 02/14/2022   Atrial fibrillation with RVR ([place]) 04/12/2021   Bradycardia 07/08/2019   Venous insufficiency of both lower extremities 07/08/2019   Abnormal glucose 01/10/2019   Elevated PSA 01/10/2019   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   Palpitations 06/13/2018   Leg edema, right 09/20/2017   History of BPH 02/21/2017   BPH (benign prostatic hyperplasia) 04/25/2016   Nocturia 04/25/2016   Gout 03/25/2016   History of colon polyps 03/25/2016   Inflammation of a vein 03/25/2016   Percutaneous transluminal coronary angioplasty status 03/25/2016   Supraventricular tachycardia 03/25/2016   Benign essential HTN 06/10/2015   Paroxysmal atrial fibrillation ([place]) 01/20/2015   Atrial  fibrillation ([place]) 05/13/2014   Coronary artery disease 05/13/2014   Chest pain 05/13/2014   Stage 3a chronic kidney disease (CKD) ([place]) 05/13/2014   Essential (primary) hypertension 05/13/2014   History of cardiac catheterization 05/13/2014   HLD (hyperlipidemia) 05/13/2014   Combined fat and carbohydrate induced hyperlipemia 05/13/2014   History of cardiovascular surgery 05/13/2014   History of anticoagulant therapy 04/28/2014   Warfarin anticoagulation 04/28/2014   Benign prostatic hyperplasia with urinary obstruction 11/05/2013    REFERRING DIAG: LBP  THERAPY DIAG:  Unsteadiness on feet  Other low back pain  Difficulty in walking, not elsewhere classified  Rationale for Evaluation and Treatment Rehabilitation  PERTINENT HISTORY: LBP, balance. Pt fell 02/17/2022 while trying to step up onto his half step to get into his home in the garage. Pt tripped on the rug which is no longer there. Pt fractured L hip S/P L hip hemiarthroplasty on 02/19/2022. Pt underwent home health PT for the hip rehab. Pt fell again a couple of weeks after participating in rehab resulting in lumbar fracture. Does not remember what caused the fall. Pt was doing something in the hall. Pt does not remember if he was using an AD. Pt had a shot in his back which took away the pain. Pt started using his SPC a couple of weeks ago. Home health PT ended last week. Independent ambulation prior to L hip fracture.  PRECAUTIONS: fall risk,   SUBJECTIVE: No back pain currently.  Back is still doing great    PAIN:  Are you having pain? 0/10    TODAY'S TREATMENT:   Therapeutic exerise   Sit <> stand from regular chair with arms with B UE assist to stand, no UE assist to sit 10x2   Stepping over shoe box   R 5x2  L 5x2  Cues to place and maintain his center of gravity over his foot. Able to perform without using his AD.  Backwards walk without use of AD 32 ft 2x 2  Stepping over 4 mini hurdles with minimal  to no AD assist 4x2   Standing hip abduction with B UE assist                green around ankles                          R 10x3                         L 10x3    Standing hip extension with B UE assist, green band   R 10x3  L 10x3  Forward step up onto and over Dyna Disc with contralateral UE assist   R 5x2  L 5x2      Improved exercise technique, movement at target joints, use of target muscles after mod verbal, visual, tactile cues.        Response to treatment Pt tolerated session well without aggravation of symptoms.      Clinical impression  Improving ability to negotiate obstacles without AD after cues for proper weight shifting for transferring his center of gravity onto his base of support. Pt tolerated session well without aggravation of symptoms.  Pt will benefit from continued skilled physical therapy services to decrease pain, improve strength, balance and function.         PATIENT EDUCATION: Education details: there-ex, HEP Person educated: Patient Education method: Explanation, Demonstration, Tactile cues, Verbal cues, and Handouts Education comprehension: verbalized understanding and returned demonstration   HOME EXERCISE PROGRAM:       Access Code: [email or web address] URL: [email or web address] Date: 07/05/2022 Prepared by: Joneen Boers    Exercises - Standing Hip Abduction with Counter Support  - 2 x daily - 7 x weekly - 1 sets - 10 reps - 5 seconds hold - Sit to Stand with Armchair  - 1 x daily - 3 x weekly - 2 sets - 8 reps - Scapular Retraction with Resistance  - 1 x daily - 3 x weekly - 3 sets - 12 reps The HEP added last session (07/19/2022) per pt: sit <> stand, stepping over obstacles, walking forward and back - Shoulder extension with resistance - Neutral  - 1 x daily - 7 x weekly - 1 sets - 10 reps - 5 seconds hold (RED BAND) - Standing Single Leg Stance with Unilateral Counter Support  - 1 x daily - 7 x weekly - 3 sets - 10 reps  - 5 seconds hold     PT Short Term Goals - 08/10/22 1019       PT SHORT TERM GOAL #1   Title Pt will be independent with his initial HEP to decrease pain, improve strength, balance, and function.    Baseline Pt has started his initial HEP (07/05/2022); 08/10/2022 reports independence compelting as prescribed.    Time 3    Period Weeks    Status Achieved    Target Date 08/10/22              PT Long Term Goals - 11/03/22 1152       PT LONG TERM GOAL #1   Title Patient will have a decrease in low back pain to 4/10 or less at worst to promote ability to perform standing tasks, ambulate, and negotiate stairs with less difficulty.    Baseline 10/10 low back pain at worst for the past 3 months (07/05/2022); denies back pain with standing, walking or stair negotiation; 4/10 at worst for the past 7 days (09/05/2022)    Time 8    Period Weeks    Status Achieved    Target Date 08/10/22      PT LONG TERM GOAL #2   Title Patient will improve his lumbar FOTO score by at least 10 points as a demonstration of improved function.    Baseline Lumbar spine FOTO 40 (07/05/2022); 52    Time 8    Period Weeks    Status Achieved    Target Date 08/10/22      PT LONG TERM GOAL #3   Title Pt will improve his TUG score to 12 seconds  or less without SPC as a demonstration of improved balance and functional mobility.    Baseline 18.33 seconds average with SPC on R side (07/05/2022); 22.05 sec  with SPC on RUE; 09/05/2022); 15 seconds average with SPC (10/04/2022); 16.2 seconds average with his NBQC (11/03/2022)    Time 6    Period Weeks    Status Partially Met    Target Date 12/15/22      PT LONG TERM GOAL #4   Title Pt will improve bilateral bilateral hip extension and abduction to promote ability to perform standing tasks more steadily.    Baseline Hip extension 4+/5 R and L, hip abduction 4+/5 R, 4/5 L (07/05/2022); deferred for primary PT; hip extension 5/5 R and L, hip abduction 5/5 R and L  (09/05/2022)    Time 8    Period Weeks    Status Achieved    Target Date 09/01/22      PT LONG TERM GOAL #5   Title Pt will improve 5xSTS without UE support to 14.8 sec or less to demonstrate clinically significant improvement for age matched norms for LE strength and reduced risk of falls.    Baseline 08/10/22: 27.29 with UE's on thighs standard height chair; B UE assist 14.18 seconds (09/05/2022); 13.50 seconds (10/04/2022)    Time 8    Period Weeks    Status Achieved    Target Date 11/03/22      PT LONG TERM GOAL #6   Title Pt will improved DGI score to > 19 points as a demonstration of improved balance.    Baseline DGI 17 points; 18 (11/03/2022)    Time 6    Period Weeks    Status On-going    Target Date 12/15/22              Plan - 12/08/22 1149     Clinical Impression Statement Improving ability to negotiate obstacles without AD after cues for proper weight shifting for transferring his center of gravity onto his base of support. Pt tolerated session well without aggravation of symptoms.  Pt will benefit from continued skilled physical therapy services to decrease pain, improve strength, balance and function.    Personal Factors and Comorbidities Age;Comorbidity 3+;Past/Current Experience;Time since onset of injury/illness/exacerbation    Comorbidities A-fib, arthritis, basal cell CA, chronic kidney disease, dysrhythmia, HTN, MI, phlebitis, L hip hemiarthroplasty    Examination-Activity Limitations Stairs;Stand;Bend;Locomotion Level;Transfers    Stability/Clinical Decision Making Stable/Uncomplicated    Clinical Decision Making Low    Rehab Potential Fair    PT Frequency 2x / week    PT Duration 6 weeks    PT Treatment/Interventions Therapeutic exercise;Therapeutic activities;Stair training;Functional mobility training;Balance training;Neuromuscular re-education;Patient/family education;Manual techniques;Dry needling;Aquatic Therapy;Electrical Stimulation;Iontophoresis 14m/ml  Dexamethasone;Gait training    PT Next Visit Plan posture, thoracic and hip extension, trunk and hip strengthening, manual techniques, modalities PRN    PT Home Exercise Plan Medbridge Access Code: [email or web address]   Consulted and Agree with Plan of Care Patient                                 MJoneen BoersPT, DPT   12/08/2022, 12:53 PM

## 2022-12-12 ENCOUNTER — Ambulatory Visit: Payer: Medicare Other

## 2022-12-12 DIAGNOSIS — R2681 Unsteadiness on feet: Secondary | ICD-10-CM | POA: Diagnosis not present

## 2022-12-12 DIAGNOSIS — R262 Difficulty in walking, not elsewhere classified: Secondary | ICD-10-CM

## 2022-12-12 DIAGNOSIS — M5459 Other low back pain: Secondary | ICD-10-CM

## 2022-12-12 NOTE — Therapy (Signed)
OUTPATIENT PHYSICAL THERAPY TREATMENT NOTE    Patient Name: Robert Rivas MRN: [id number] DOB:05-Jan-1941, 81 y.o., male Today's Date: 12/12/2022  PCP: Idelle Crouch, MD REFERRING PROVIDER: Idelle Crouch, MD   PT End of Session - 12/12/22 0936     Visit Number 36    Number of Visits 45    Date for PT Re-Evaluation 12/15/22    Authorization Type UHC Medicare Primary; Tric[employer] for Life sceondary    Authorization Time Period 11/03/22-12/15/22    Progress Note Due on Visit 40    PT Start Time 0936    PT Stop Time 1017    PT Time Calculation (min) 41 min    Equipment Utilized During Treatment Gait belt    Activity Tolerance Patient tolerated treatment well;No increased pain    Behavior During Therapy WFL for tasks assessed/performed                                       Past Medical History:  Diagnosis Date   A-fib [facility])    Arthritis    lower back   Basal cell carcinoma 10/29/2020   Right post base of skull/neck   BPH (benign prostatic hyperplasia)    Cancer (HCC)    Skin Cancer   Chronic kidney disease    Chronic Kidney Disease   Coronary artery disease    Dysrhythmia    Atrial Fibrillation; Supraventricular Tachycardia   Gout    Hearing aid worn    has, does not wear   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   L upper back 5.0cm inf to base of neck, 5.0cm lat to spine   Hyperlipidemia    Hypertension    MI, old    Nocturia    Phlebitis    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   SCC IS R foreearm distal   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   SCC IS R forearm proximal   Squamous cell carcinoma of skin 03/26/2018   SCC IS R forearm   Squamous cell carcinoma of skin 01/01/2008   SCC IS R forearm   Wears dentures    partial   Past Surgical History:  Procedure Laterality Date   cardaic stents     CARDIAC CATHETERIZATION     PTCA with Stent Placement   CARDIOVERSION N/A 04/13/2021   Procedure: CARDIOVERSION;   Surgeon: Corey Skains, MD;  Location: [facility] ORS;  Service: Cardiovascular;  Laterality: N/A;   CARDIOVERSION N/A 05/05/2021   Procedure: CARDIOVERSION;  Surgeon: Corey Skains, MD;  Location: [facility] ORS;  Service: Cardiovascular;  Laterality: N/A;   COLONOSCOPY WITH PROPOFOL N/A 12/15/2015   Procedure: COLONOSCOPY WITH PROPOFOL;  Surgeon: Lollie Sails, MD;  Location: [facility] ENDOSCOPY;  Service: Endoscopy;  Laterality: N/A;   CORONARY ARTERY BYPASS GRAFT  1992   ECTROPION REPAIR Right 12/12/2017   Procedure: REPAIR OF ECTROPION SUTURES/EXTENSIVE RIGHT;  Surgeon: Karle Starch, MD;  Location: [place];  Service: Ophthalmology;  Laterality: Right;   HERNIA REPAIR     HIP ARTHROPLASTY Left 02/19/2022   Procedure: ARTHROPLASTY BIPOLAR HIP (HEMIARTHROPLASTY);  Surgeon: Thornton Park, MD;  Location: [facility] ORS;  Service: Orthopedics;  Laterality: Left;   IR KYPHO LUMBAR INC FX REDUCE BONE BX UNI/BIL CANNULATION INC/IMAGING  06/07/2022   IR RADIOLOGIST EVAL & MGMT  06/30/2022   SVT ABLATION  02/22/2017   [facility]  Patient Active Problem List   Diagnosis Date Noted   Acute urinary retention 02/19/2022   Acute delirium 02/18/2022   Dysphagia 02/18/2022   Hyponatremia 02/15/2022   Closed left hip fracture ([place]) 02/14/2022   Atrial fibrillation with RVR ([place]) 04/12/2021   Bradycardia 07/08/2019   Venous insufficiency of both lower extremities 07/08/2019   Abnormal glucose 01/10/2019   Elevated PSA 01/10/2019   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   Palpitations 06/13/2018   Leg edema, right 09/20/2017   History of BPH 02/21/2017   BPH (benign prostatic hyperplasia) 04/25/2016   Nocturia 04/25/2016   Gout 03/25/2016   History of colon polyps 03/25/2016   Inflammation of a vein 03/25/2016   Percutaneous transluminal coronary angioplasty status 03/25/2016   Supraventricular tachycardia 03/25/2016   Benign essential HTN 06/10/2015   Paroxysmal atrial fibrillation ([place]) 01/20/2015   Atrial  fibrillation ([place]) 05/13/2014   Coronary artery disease 05/13/2014   Chest pain 05/13/2014   Stage 3a chronic kidney disease (CKD) ([place]) 05/13/2014   Essential (primary) hypertension 05/13/2014   History of cardiac catheterization 05/13/2014   HLD (hyperlipidemia) 05/13/2014   Combined fat and carbohydrate induced hyperlipemia 05/13/2014   History of cardiovascular surgery 05/13/2014   History of anticoagulant therapy 04/28/2014   Warfarin anticoagulation 04/28/2014   Benign prostatic hyperplasia with urinary obstruction 11/05/2013    REFERRING DIAG: LBP  THERAPY DIAG:  Unsteadiness on feet  Other low back pain  Difficulty in walking, not elsewhere classified  Rationale for Evaluation and Treatment Rehabilitation  PERTINENT HISTORY: LBP, balance. Pt fell 02/17/2022 while trying to step up onto his half step to get into his home in the garage. Pt tripped on the rug which is no longer there. Pt fractured L hip S/P L hip hemiarthroplasty on 02/19/2022. Pt underwent home health PT for the hip rehab. Pt fell again a couple of weeks after participating in rehab resulting in lumbar fracture. Does not remember what caused the fall. Pt was doing something in the hall. Pt does not remember if he was using an AD. Pt had a shot in his back which took away the pain. Pt started using his SPC a couple of weeks ago. Home health PT ended last week. Independent ambulation prior to L hip fracture.  PRECAUTIONS: fall risk,   SUBJECTIVE: Back is doing good.     PAIN:  Are you having pain? 0/10    TODAY'S TREATMENT:   Therapeutic exerise   Sit <> stand from regular chair with arms with B UE assist to stand, no UE assist to sit 10x2   Stepping over shoe box   R 5x2  L 5x2  Cues to place and maintain his center of gravity over his foot. Able to perform without using his AD after a few tries with the NBQC to accustom his body to the mechanics.    Stepping over shoe box on floor mat   R 5x  L  5x   Then with Air ex pad on mat right before shoe box with one UE assist from treadmill bar. Unable to perform with NBQC   R 10x2   L 10x2   Standing hip abduction with B UE assist                green around ankles                          R 10x3  L 10x3    Standing hip extension with B UE assist, green band   R 10x3  L 10x3  Backwards walk without use of AD 30 ft 2x    Improved exercise technique, movement at target joints, use of target muscles after mod verbal, visual, tactile cues.     Response to treatment Pt tolerated session well without aggravation of symptoms.      Clinical impression  Worked on forward weight shifting onto base of support to improve ability to negotiate obstacles on both firm and uneven surfaces. Demonstrates difficulty with obstacle negotiation on uneven surface. Overall improving obstacle negotiation with proper technique.  Pt tolerated session well without aggravation of symptoms.  Pt will benefit from continued skilled physical therapy services to decrease pain, improve strength, balance and function.         PATIENT EDUCATION: Education details: there-ex, HEP Person educated: Patient Education method: Explanation, Demonstration, Tactile cues, Verbal cues, and Handouts Education comprehension: verbalized understanding and returned demonstration   HOME EXERCISE PROGRAM:       Access Code: [email or web address] URL: [email or web address] Date: 07/05/2022 Prepared by: Joneen Boers    Exercises - Standing Hip Abduction with Counter Support  - 2 x daily - 7 x weekly - 1 sets - 10 reps - 5 seconds hold - Sit to Stand with Armchair  - 1 x daily - 3 x weekly - 2 sets - 8 reps - Scapular Retraction with Resistance  - 1 x daily - 3 x weekly - 3 sets - 12 reps The HEP added last session (07/19/2022) per pt: sit <> stand, stepping over obstacles, walking forward and back - Shoulder extension with resistance - Neutral   - 1 x daily - 7 x weekly - 1 sets - 10 reps - 5 seconds hold (RED BAND) - Standing Single Leg Stance with Unilateral Counter Support  - 1 x daily - 7 x weekly - 3 sets - 10 reps - 5 seconds hold     PT Short Term Goals - 08/10/22 1019       PT SHORT TERM GOAL #1   Title Pt will be independent with his initial HEP to decrease pain, improve strength, balance, and function.    Baseline Pt has started his initial HEP (07/05/2022); 08/10/2022 reports independence compelting as prescribed.    Time 3    Period Weeks    Status Achieved    Target Date 08/10/22              PT Long Term Goals - 11/03/22 1152       PT LONG TERM GOAL #1   Title Patient will have a decrease in low back pain to 4/10 or less at worst to promote ability to perform standing tasks, ambulate, and negotiate stairs with less difficulty.    Baseline 10/10 low back pain at worst for the past 3 months (07/05/2022); denies back pain with standing, walking or stair negotiation; 4/10 at worst for the past 7 days (09/05/2022)    Time 8    Period Weeks    Status Achieved    Target Date 08/10/22      PT LONG TERM GOAL #2   Title Patient will improve his lumbar FOTO score by at least 10 points as a demonstration of improved function.    Baseline Lumbar spine FOTO 40 (07/05/2022); 52    Time 8    Period Weeks    Status Achieved    Target Date 08/10/22  PT LONG TERM GOAL #3   Title Pt will improve his TUG score to 12 seconds or less without SPC as a demonstration of improved balance and functional mobility.    Baseline 18.33 seconds average with SPC on R side (07/05/2022); 22.05 sec  with SPC on RUE; 09/05/2022); 15 seconds average with SPC (10/04/2022); 16.2 seconds average with his NBQC (11/03/2022)    Time 6    Period Weeks    Status Partially Met    Target Date 12/15/22      PT LONG TERM GOAL #4   Title Pt will improve bilateral bilateral hip extension and abduction to promote ability to perform standing tasks more  steadily.    Baseline Hip extension 4+/5 R and L, hip abduction 4+/5 R, 4/5 L (07/05/2022); deferred for primary PT; hip extension 5/5 R and L, hip abduction 5/5 R and L (09/05/2022)    Time 8    Period Weeks    Status Achieved    Target Date 09/01/22      PT LONG TERM GOAL #5   Title Pt will improve 5xSTS without UE support to 14.8 sec or less to demonstrate clinically significant improvement for age matched norms for LE strength and reduced risk of falls.    Baseline 08/10/22: 27.29 with UE's on thighs standard height chair; B UE assist 14.18 seconds (09/05/2022); 13.50 seconds (10/04/2022)    Time 8    Period Weeks    Status Achieved    Target Date 11/03/22      PT LONG TERM GOAL #6   Title Pt will improved DGI score to > 19 points as a demonstration of improved balance.    Baseline DGI 17 points; 18 (11/03/2022)    Time 6    Period Weeks    Status On-going    Target Date 12/15/22              Plan - 12/12/22 0940     Clinical Impression Statement Worked on forward weight shifting onto base of support to improve ability to negotiate obstacles on both firm and uneven surfaces. Demonstrates difficulty with obstacle negotiation on uneven surface. Overall improving obstacle negotiation with proper technique.  Pt tolerated session well without aggravation of symptoms.  Pt will benefit from continued skilled physical therapy services to decrease pain, improve strength, balance and function.    Personal Factors and Comorbidities Age;Comorbidity 3+;Past/Current Experience;Time since onset of injury/illness/exacerbation    Comorbidities A-fib, arthritis, basal cell CA, chronic kidney disease, dysrhythmia, HTN, MI, phlebitis, L hip hemiarthroplasty    Examination-Activity Limitations Stairs;Stand;Bend;Locomotion Level;Transfers    Stability/Clinical Decision Making Stable/Uncomplicated    Rehab Potential Fair    PT Frequency 2x / week    PT Duration 6 weeks    PT Treatment/Interventions  Therapeutic exercise;Therapeutic activities;Stair training;Functional mobility training;Balance training;Neuromuscular re-education;Patient/family education;Manual techniques;Dry needling;Aquatic Therapy;Electrical Stimulation;Iontophoresis 21m/ml Dexamethasone;Gait training    PT Next Visit Plan posture, thoracic and hip extension, trunk and hip strengthening, manual techniques, modalities PRN    PT Home Exercise Plan Medbridge Access Code: [email or web address]   Consulted and Agree with Plan of Care Patient                                  MJoneen BoersPT, DPT   12/12/2022, 10:39 AM

## 2022-12-15 ENCOUNTER — Ambulatory Visit: Payer: Medicare Other

## 2022-12-15 DIAGNOSIS — R262 Difficulty in walking, not elsewhere classified: Secondary | ICD-10-CM

## 2022-12-15 DIAGNOSIS — R2681 Unsteadiness on feet: Secondary | ICD-10-CM

## 2022-12-15 DIAGNOSIS — M5459 Other low back pain: Secondary | ICD-10-CM

## 2022-12-15 NOTE — Therapy (Signed)
OUTPATIENT PHYSICAL THERAPY TREATMENT NOTE And Discharge Summary    Patient Name: Robert Rivas MRN: [id number] DOB:03/29/1941, 81 y.o., male Today's Date: 12/15/2022  PCP: Idelle Crouch, MD REFERRING PROVIDER: Idelle Crouch, MD   PT End of Session - 12/15/22 0925     Visit Number 37    Number of Visits 45    Date for PT Re-Evaluation 12/15/22    Authorization Type [employer] Medicare Primary; [employer] sceondary    Authorization Time Period 11/03/22-12/15/22    Progress Note Due on Visit 40    PT Start Time 0925    PT Stop Time 1008    PT Time Calculation (min) 43 min    Equipment Utilized During Treatment Gait belt    Activity Tolerance Patient tolerated treatment well;No increased pain    Behavior During Therapy WFL for tasks assessed/performed                                        Past Medical History:  Diagnosis Date   A-fib [facility])    Arthritis    lower back   Basal cell carcinoma 10/29/2020   Right post base of skull/neck   BPH (benign prostatic hyperplasia)    Cancer (HCC)    Skin Cancer   Chronic kidney disease    Chronic Kidney Disease   Coronary artery disease    Dysrhythmia    Atrial Fibrillation; Supraventricular Tachycardia   Gout    Hearing aid worn    has, does not wear   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   L upper back 5.0cm inf to base of neck, 5.0cm lat to spine   Hyperlipidemia    Hypertension    MI, old    Nocturia    Phlebitis    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   SCC IS R foreearm distal   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   SCC IS R forearm proximal   Squamous cell carcinoma of skin 03/26/2018   SCC IS R forearm   Squamous cell carcinoma of skin 01/01/2008   SCC IS R forearm   Wears dentures    partial   Past Surgical History:  Procedure Laterality Date   cardaic stents     CARDIAC CATHETERIZATION     PTCA with Stent Placement   CARDIOVERSION N/A 04/13/2021    Procedure: CARDIOVERSION;  Surgeon: Corey Skains, MD;  Location: [facility] ORS;  Service: Cardiovascular;  Laterality: N/A;   CARDIOVERSION N/A 05/05/2021   Procedure: CARDIOVERSION;  Surgeon: Corey Skains, MD;  Location: [facility] OR[facility];  Service: Cardiovascular;  Laterality: N/A;   COLONOSCOPY WITH PROPOFOL N/A 12/15/2015   Procedure: COLONOSCOPY WITH PROPOFOL;  Surgeon: Lollie Sails, MD;  Location: [facility] ENDOSCOPY;  Service: Endoscopy;  Laterality: N/A;   CORONARY ARTERY BYPASS GRAFT  1992   ECTROPION REPAIR Right 12/12/2017   Procedure: REPAIR OF ECTROPION SUTURES/EXTENSIVE RIGHT;  Surgeon: Karle Starch, MD;  Location: [place];  Service: Ophthalmology;  Laterality: Right;   HERNIA REPAIR     HIP ARTHROPLASTY Left 02/19/2022   Procedure: ARTHROPLASTY BIPOLAR HIP (HEMIARTHROPLASTY);  Surgeon: Thornton Park, MD;  Location: [facility] ORS;  Service: Orthopedics;  Laterality: Left;   IR KYPHO LUMBAR INC FX REDUCE BONE BX UNI/BIL CANNULATION INC/IMAGING  06/07/2022   IR RADIOLOGIST EVAL & MGMT  06/30/2022   SVT ABLATION  02/22/2017  [facility]   Patient Active Problem List   Diagnosis Date Noted   Acute urinary retention 02/19/2022   Acute delirium 02/18/2022   Dysphagia 02/18/2022   Hyponatremia 02/15/2022   Closed left hip fracture ([place]) 02/14/2022   Atrial fibrillation with RVR ([place]) 04/12/2021   Bradycardia 07/08/2019   Venous insufficiency of both lower extremities 07/08/2019   Abnormal glucose 01/10/2019   Elevated PSA 01/10/2019   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) 05/13/2014   Essential (primary) hypertension 05/13/2014   History of cardiac catheterization 05/13/2014   HLD (hyperlipidemia) 05/13/2014   Combined fat and carbohydrate induced hyperlipemia 05/13/2014   History of cardiovascular surgery 05/13/2014   History of anticoagulant therapy 04/28/2014   Warfarin anticoagulation 04/28/2014   Benign prostatic hyperplasia with urinary obstruction 11/05/2013    REFERRING DIAG: LBP  THERAPY DIAG:  Unsteadiness on feet  Other low back pain  Difficulty in walking, not elsewhere classified  Rationale for Evaluation and Treatment Rehabilitation  PERTINENT HISTORY: LBP, balance. Pt fell 02/17/2022 while trying to step up onto his half step to get into his home in the garage. Pt tripped on the rug which is no longer there. Pt fractured L hip S/P L hip hemiarthroplasty on 02/19/2022. Pt underwent home health PT for the hip rehab. Pt fell again a couple of weeks after participating in rehab resulting in lumbar fracture. Does not remember what caused the fall. Pt was doing something in the hall. Pt does not remember if he was using an AD. Pt had a shot in his back which took away the pain. Pt started using his SPC a couple of weeks ago. Home health PT ended last week. Independent ambulation prior to L hip fracture.  PRECAUTIONS: fall risk,   SUBJECTIVE: Back is doing good. Balance is good. Was able to walk just carrying his NBQC yesterday when going out to dinner with his wife. Back is usually good in the morning but increases to a 3/10 at the end of the day. 2/10 low back pain at worst for the past 7 days. Started back going to the gym yesterday.      PAIN:  Are you having pain? 0/10    TODAY'S TREATMENT:   Therapeutic exerise    Sit <> stand from regular chair with arms with B UE  assist to stand, no UE assist to sit 10x2  Standing up from a chair, walking 10 ft forward, then returning 10 ft, then sitting back onto chair 3x  Without AD: 14.06 seconds, 13.10 seconds; 12.79 seconds ( 13.32 seconds average)  Directed patient with gait with normal gait speed, with changes in speed, 180 degree pivot turn, with R and L cervical rotation position, with cervical flexion and extension position, stepping around obstacles, stepping over an obstacle, ascending and descending 4 regular steps with UE assist   Reviewed progress with PT towards goals  Standing hip abduction with B UE assist  green around ankles                          R 10x3                         L 10x3    Standing hip extension with B UE assist, green band   R 10x3  L 10x3  Backwards walk without use of AD 30 ft 4x      Improved exercise technique, movement at target joints, use of target muscles after mod verbal, visual, tactile cues.     Response to treatment Pt tolerated session well without aggravation of symptoms.      Clinical impression  Pt demonstrates significant decrease in low back pain, improved function, hip strength, balance, and ability to ambulate without use of his AD since initial evaluation. Pt has made very good progress with PT towards goals and demonstrates consistency  with his HEP. Skilled physical therapy services discharged with pt continuing his progress with his exercises at home.           PATIENT EDUCATION: Education details: there-ex, HEP Person educated: Patient Education method: Explanation, Demonstration, Tactile cues, Verbal cues, and Handouts Education comprehension: verbalized understanding and returned demonstration   HOME EXERCISE PROGRAM:       Access Code: [email or web address] URL: [email or web address] Date: 07/05/2022 Prepared by: Joneen Boers    Exercises - Standing Hip Abduction with Counter Support  - 2 x daily - 7 x  weekly - 1 sets - 10 reps - 5 seconds hold - Sit to Stand with Armchair  - 1 x daily - 3 x weekly - 2 sets - 8 reps - Scapular Retraction with Resistance  - 1 x daily - 3 x weekly - 3 sets - 12 reps The HEP added last session (07/19/2022) per pt: sit <> stand, stepping over obstacles, walking forward and back - Shoulder extension with resistance - Neutral  - 1 x daily - 7 x weekly - 1 sets - 10 reps - 5 seconds hold (RED BAND) - Standing Single Leg Stance with Unilateral Counter Support  - 1 x daily - 7 x weekly - 3 sets - 10 reps - 5 seconds hold     PT Short Term Goals - 08/10/22 1019       PT SHORT TERM GOAL #1   Title Pt will be independent with his initial HEP to decrease pain, improve strength, balance, and function.    Baseline Pt has started his initial HEP (07/05/2022); 08/10/2022 reports independence compelting as prescribed.    Time 3    Period Weeks    Status Achieved    Target Date 08/10/22              PT Long Term Goals - 12/15/22 0934       PT LONG TERM GOAL #1   Title Patient will have a decrease in low back pain to 4/10 or less at worst to promote ability to perform standing tasks, ambulate, and negotiate stairs with less difficulty.    Baseline 10/10 low back pain at worst for the past 3 months (07/05/2022); denies back pain with standing, walking or stair negotiation; 4/10 at worst for the past 7 days (09/05/2022) 2-3/10 at worst for the past 7 days (12/15/2022)    Time 8    Period Weeks    Status Achieved    Target Date 08/10/22  PT LONG TERM GOAL #2   Title Patient will improve his lumbar FOTO score by at least 10 points as a demonstration of improved function.    Baseline Lumbar spine FOTO 40 (07/05/2022); 52    Time 8    Period Weeks    Status Achieved    Target Date 08/10/22      PT LONG TERM GOAL #3   Title Pt will improve his TUG score to 12 seconds or less without SPC as a demonstration of improved balance and functional mobility.    Baseline  18.33 seconds average with SPC on R side (07/05/2022); 22.05 sec  with SPC on RUE; 09/05/2022); 15 seconds average with SPC (10/04/2022); 16.2 seconds average with his NBQC (11/03/2022); 13.32 seconds average without AD. (12/15/2022)    Time 6    Period Weeks    Status Partially Met    Target Date 12/15/22      PT LONG TERM GOAL #4   Title Pt will improve bilateral bilateral hip extension and abduction to promote ability to perform standing tasks more steadily.    Baseline Hip extension 4+/5 R and L, hip abduction 4+/5 R, 4/5 L (07/05/2022); deferred for primary PT; hip extension 5/5 R and L, hip abduction 5/5 R and L (09/05/2022)    Time 8    Period Weeks    Status Achieved    Target Date 09/01/22      PT LONG TERM GOAL #5   Title Pt will improve 5xSTS without UE support to 14.8 sec or less to demonstrate clinically significant improvement for age matched norms for LE strength and reduced risk of falls.    Baseline 08/10/22: 27.29 with UE's on thighs standard height chair; B UE assist 14.18 seconds (09/05/2022); 13.50 seconds (10/04/2022)    Time 8    Period Weeks    Status Achieved    Target Date 11/03/22      PT LONG TERM GOAL #6   Title Pt will improved DGI score to > 19 points as a demonstration of improved balance.    Baseline DGI 17 points; 18 (11/03/2022); 20 points (12/15/2022)    Time 6    Period Weeks    Status Achieved    Target Date 12/15/22              Plan - 12/15/22 0924     Clinical Impression Statement Pt demonstrates significant decrease in low back pain, improved function, hip strength, balance, and ability to ambulate without use of his AD since initial evaluation. Pt has made very good progress with PT towards goals and demonstrates consistency  with his HEP. Skilled physical therapy services discharged with pt continuing his progress with his exercises at home.    Personal Factors and Comorbidities Age;Comorbidity 3+;Past/Current Experience;Time since onset of  injury/illness/exacerbation    Comorbidities A-fib, arthritis, basal cell CA, chronic kidney disease, dysrhythmia, HTN, MI, phlebitis, L hip hemiarthroplasty    Examination-Activity Limitations Stairs;Stand;Bend;Locomotion Level;Transfers    Stability/Clinical Decision Making Stable/Uncomplicated    Clinical Decision Making Low    Rehab Potential --    PT Frequency --    PT Duration --    PT Treatment/Interventions Therapeutic exercise;Therapeutic activities;Stair training;Functional mobility training;Balance training;Neuromuscular re-education;Patient/family education;Manual techniques;Gait training    PT Next Visit Plan Continue progress with his exercises at home.    PT Home Exercise Plan Medbridge Access Code: [email or web address]    Consulted and Agree with Plan of Care Patient  Thank you for your referral.   Joneen Boers PT, DPT   12/15/2022, 10:16 AM

## 2022-12-21 ENCOUNTER — Ambulatory Visit: Payer: Medicare Other

## 2023-01-05 ENCOUNTER — Encounter: Payer: Self-pay | Admitting: Ophthalmology

## 2023-01-10 NOTE — Discharge Instructions (Signed)
   Cataract Surgery, Care After ? ?This sheet gives you information about how to care for yourself after your surgery.  Your ophthalmologist may also give you more specific instructions.  If you have problems or questions, contact your doctor at [facility], [phone]. ? ?What can I expect after the surgery? ?It is common to have: ?Itching ?Foreign body sensation (feels like a grain of sand in the eye) ?Watery discharge (excess tearing) ?Sensitivity to light and touch ?Bruising in or around the eye ?Mild blurred vision ? ?Follow these instructions at home: ?Do not touch or rub your eyes. ?You may be told to wear a protective shield or sunglasses to protect your eyes. ?Do not put a contact lens in the operative eye unless your doctor approves. ?Keep the lids and face clean and dry. ?Do not allow water to hit you directly in the face while showering. ?Keep soap and shampoo out of your eyes. ?Do not use eye makeup for 1 week. ? ?Check your eye every day for signs of infection.  Watch for: ?Redness, swelling, or pain. ?Fluid, blood or pus. ?Worsening vision. ?Worsening sensitivity to light or touch. ? ?Activity: ?During the first day, avoid bending over and reading.  You may resume reading and bending the next day. ?Do not drive or use heavy machinery for at least 24 hours. ?Avoid strenuous activities for 1 week.  Activities such as walking, treadmill, exercise bike, and climbing stairs are okay. ?Do not lift heavy (>20 pound) objects for 1 week. ?Do not do yardwork, gardening, or dirty housework (mopping, cleaning bathrooms, vacuuming, etc.) for 1 week. ?Do not swim or use a hot tub for 2 weeks. ?Ask your doctor when you can return to work. ? ?General Instructions: ?Take or apply prescription and over-the-counter medicines as directed by your doctor, including eyedrops and ointments. ?Resume medications discontinued prior to surgery, unless told otherwise by your doctor. ?Keep all follow up appointments as  scheduled. ? ?Contact a health care provider if: ?You have increased bruising around your eye. ?You have pain that is not helped with medication. ?You have a fever. ?You have fluid, pus, or blood coming from your eye or incision. ?Your sensitivity to light gets worse. ?You have spots (floaters) of flashing lights in your vision. ?You have nausea or vomiting. ? ?Go to the nearest emergency room or call 911 if: ?You have sudden loss of vision. ?You have severe, worsening eye pain. ? ?

## 2023-01-12 ENCOUNTER — Encounter: Payer: Self-pay | Admitting: Ophthalmology

## 2023-01-12 ENCOUNTER — Ambulatory Visit: Payer: Medicare Other | Admitting: Anesthesiology

## 2023-01-12 ENCOUNTER — Other Ambulatory Visit: Payer: Self-pay

## 2023-01-12 ENCOUNTER — Ambulatory Visit
Admission: RE | Admit: 2023-01-12 | Discharge: 2023-01-12 | Disposition: A | Discharge status: Home / Self Care | Payer: Medicare Other | Attending: Ophthalmology | Admitting: Ophthalmology

## 2023-01-12 ENCOUNTER — Encounter
Admission: RE | Disposition: A | Discharge status: Home / Self Care | Payer: Self-pay | Source: Home / Self Care | Attending: Ophthalmology

## 2023-01-12 DIAGNOSIS — Z951 Presence of aortocoronary bypass graft: Secondary | ICD-10-CM | POA: Insufficient documentation

## 2023-01-12 DIAGNOSIS — I4891 Unspecified atrial fibrillation: Secondary | ICD-10-CM | POA: Diagnosis not present

## 2023-01-12 DIAGNOSIS — I340 Nonrheumatic mitral (valve) insufficiency: Secondary | ICD-10-CM | POA: Insufficient documentation

## 2023-01-12 DIAGNOSIS — I129 Hypertensive chronic kidney disease with stage 1 through stage 4 chronic kidney disease, or unspecified chronic kidney disease: Secondary | ICD-10-CM | POA: Diagnosis not present

## 2023-01-12 DIAGNOSIS — Z955 Presence of coronary angioplasty implant and graft: Secondary | ICD-10-CM | POA: Insufficient documentation

## 2023-01-12 DIAGNOSIS — N189 Chronic kidney disease, unspecified: Secondary | ICD-10-CM | POA: Insufficient documentation

## 2023-01-12 DIAGNOSIS — I2510 Atherosclerotic heart disease of native coronary artery without angina pectoris: Secondary | ICD-10-CM | POA: Diagnosis not present

## 2023-01-12 DIAGNOSIS — H2512 Age-related nuclear cataract, left eye: Secondary | ICD-10-CM | POA: Diagnosis present

## 2023-01-12 HISTORY — DX: Presence of external hearing-aid: Z97.4

## 2023-01-12 HISTORY — PX: CATARACT EXTRACTION W/PHACO: SHX586

## 2023-01-12 SURGERY — PHACOEMULSIFICATION, CATARACT, WITH IOL INSERTION
Anesthesia: Monitor Anesthesia Care | Site: Eye | Laterality: Left

## 2023-01-12 MED ORDER — FENTANYL CITRATE (PF) 100 MCG/2ML IJ SOLN
INTRAMUSCULAR | Status: DC | PRN
  Administered 2023-01-12: 50 ug via INTRAVENOUS

## 2023-01-12 MED ORDER — SIGHTPATH DOSE#1 BSS IO SOLN
INTRAOCULAR | Status: DC | PRN
  Administered 2023-01-12: 15 mL

## 2023-01-12 MED ORDER — ARMC OPHTHALMIC DILATING DROPS
1.0000 | OPHTHALMIC | Status: DC | PRN
  Administered 2023-01-12 (×3): 1 via OPHTHALMIC

## 2023-01-12 MED ORDER — SIGHTPATH DOSE#1 BSS IO SOLN
INTRAOCULAR | Status: DC | PRN
  Administered 2023-01-12: 100 mL via OPHTHALMIC

## 2023-01-12 MED ORDER — MOXIFLOXACIN HCL 0.5 % OP SOLN
OPHTHALMIC | Status: DC | PRN
  Administered 2023-01-12: .2 mL via OPHTHALMIC

## 2023-01-12 MED ORDER — BRIMONIDINE TARTRATE-TIMOLOL 0.2-0.5 % OP SOLN
OPHTHALMIC | Status: DC | PRN
  Administered 2023-01-12: 1 [drp] via OPHTHALMIC

## 2023-01-12 MED ORDER — TETRACAINE HCL 0.5 % OP SOLN
1.0000 [drp] | OPHTHALMIC | Status: DC | PRN
  Administered 2023-01-12 (×3): 1 [drp] via OPHTHALMIC

## 2023-01-12 MED ORDER — SIGHTPATH DOSE#1 NA HYALUR & NA CHOND-NA HYALUR IO KIT
PACK | INTRAOCULAR | Status: DC | PRN
  Administered 2023-01-12: 1 via OPHTHALMIC

## 2023-01-12 MED ORDER — LIDOCAINE HCL (PF) 2 % IJ SOLN
INTRAOCULAR | Status: DC | PRN
  Administered 2023-01-12: 1 mL via INTRAOCULAR

## 2023-01-12 SURGICAL SUPPLY — 13 items
CATARACT SUITE SIGHTPATH (MISCELLANEOUS) ×1 IMPLANT
DISSECTOR HYDRO NUCLEUS 50X22 (MISCELLANEOUS) ×1 IMPLANT
DRSG TEGADERM 2-3/8X2-3/4 SM (GAUZE/BANDAGES/DRESSINGS) ×1 IMPLANT
FEE CATARACT SUITE SIGHTPATH (MISCELLANEOUS) ×1 IMPLANT
GLOVE SURG SYN 7.5  E (GLOVE) ×1
GLOVE SURG SYN 7.5 E (GLOVE) ×1 IMPLANT
GLOVE SURG SYN 7.5 PF PI (GLOVE) ×1 IMPLANT
GLOVE SURG SYN 8.5  E (GLOVE) ×1
GLOVE SURG SYN 8.5 E (GLOVE) ×1 IMPLANT
GLOVE SURG SYN 8.5 PF PI (GLOVE) ×1 IMPLANT
LENS IOL TECNIS EYHANCE 18.0 (Intraocular Lens) IMPLANT
RING MALYGIN (MISCELLANEOUS) IMPLANT
WATER STERILE IRR 250ML POUR (IV SOLUTION) ×1 IMPLANT

## 2023-01-12 NOTE — Anesthesia Postprocedure Evaluation (Signed)
Anesthesia Post Note  [name]  Procedure(s) Performed: CATARACT EXTRACTION PHACO AND INTRAOCULAR LENS PLACEMENT (IOC) LEFT COMPLICATED MALYUGIN OMIDRIA   12.02  01:30.8 (Left: Eye)  Patient location during evaluation: PACU Anesthesia Type: MAC Level of consciousness: awake and alert Pain management: pain level controlled Vital Signs Assessment: post-procedure vital signs reviewed and stable Respiratory status: spontaneous breathing, nonlabored ventilation, respiratory function stable and patient connected to nasal cannula oxygen Cardiovascular status: stable and blood pressure returned to baseline Postop Assessment: no apparent nausea or vomiting Anesthetic complications: no   No notable events documented.   Last Vitals:  Vitals:   01/12/23 1412 01/12/23 1415  BP:  (!) 163/88  Pulse: 73 84  Resp: 14 20  Temp: 36.5 C   SpO2: 98% 98%    Last Pain:  Vitals:   01/12/23 1412  TempSrc:   PainSc: 0-No pain                 Robert Rivas

## 2023-01-12 NOTE — H&P (Signed)
[facility]   Primary Care Physician:  Idelle Crouch, MD Ophthalmologist: Dr. Merleen Nicely  Pre-Procedure History & Physical: HPI:  Robert Rivas is a 82 y.o. male here for cataract surgery.   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   L upper back 5.0cm inf to base of neck, 5.0cm lat to spine   Hyperlipidemia    Hypertension    MI, old    Nocturia    Phlebitis    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   SCC IS R foreearm distal   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   SCC IS R forearm proximal   Squamous cell carcinoma of skin 03/26/2018   SCC IS R forearm   Squamous cell carcinoma of skin 01/01/2008   SCC IS R forearm   Wears dentures    partial   Wears hearing aid in both ears     Past Surgical History:  Procedure Laterality Date   cardaic stents     CARDIAC CATHETERIZATION     PTCA with Stent Placement   CARDIOVERSION N/A 04/13/2021   Procedure: CARDIOVERSION;  Surgeon: Corey Skains, MD;  Location: [facility] ORS;  Service: Cardiovascular;  Laterality: N/A;   CARDIOVERSION N/A 05/05/2021   Procedure: CARDIOVERSION;  Surgeon: Corey Skains, MD;  Location: [facility] ORS;  Service: Cardiovascular;  Laterality: N/A;   COLONOSCOPY WITH PROPOFOL N/A 12/15/2015   Procedure: COLONOSCOPY WITH PROPOFOL;  Surgeon: Lollie Sails, MD;  Location: [facility] ENDOSCOPY;  Service: Endoscopy;  Laterality: N/A;   CORONARY ARTERY BYPASS GRAFT  1992   ECTROPION REPAIR Right 12/12/2017   Procedure: REPAIR OF ECTROPION SUTURES/EXTENSIVE RIGHT;  Surgeon: Karle Starch, MD;  Location: [place];  Service: Ophthalmology;  Laterality: Right;   HERNIA REPAIR     HIP ARTHROPLASTY Left 02/19/2022   Procedure: ARTHROPLASTY BIPOLAR HIP (HEMIARTHROPLASTY);  Surgeon: Thornton Park, MD;  Location: [facility] ORS;  Service: Orthopedics;  Laterality: Left;   IR KYPHO LUMBAR INC FX REDUCE BONE BX UNI/BIL CANNULATION INC/IMAGING  06/07/2022   IR RADIOLOGIST EVAL & MGMT  06/30/2022   SVT ABLATION  02/22/2017   [facility]    Prior to Admission medications   Medication Sig Start Date End Date Taking? Authorizing Provider  amLODipine (NORVASC) 5 MG tablet Take 5 mg by mouth daily.   Yes [provider]  apixaban (ELIQUIS) 2.5 MG TABS tablet Take 2.5 mg by mouth 2 (two) times daily. 03/14/22  Yes [provider]  clonazePAM (KLONOPIN) 1 MG tablet Take 1 mg by mouth 2 (two) times daily as needed for anxiety.   Yes [provider]  Cyanocobalamin (VITAMIN B-12 IJ) Inject 1,000 mcg as directed every 30 (thirty) days.   Yes [provider]  doxazosin (CARDURA) 2 MG tablet Take 2 mg by mouth daily.   Yes [provider]  ezetimibe (ZETIA) 10 MG tablet Take 10 mg by mouth at bedtime.    Yes  [provider]  furosemide (LASIX) 20 MG tablet Take 20 mg by mouth daily as needed.   Yes [provider]  hydrALAZINE (APRESOLINE) 50 MG tablet Take 50 mg by mouth 2 (two) times daily. 11/25/21  Yes [provider]  hydrochlorothiazide (HYDRODIURIL) 12.5 MG tablet Take 12.5 mg by mouth daily.   Yes [provider]  methocarbamol (ROBAXIN) 500 MG tablet Take 500 mg by mouth 2 (two) times daily.   Yes [provider]  montelukast (SINGULAIR) 10 MG tablet Take 10 mg by mouth at bedtime. 08/12/16  Yes [provider]  Multiple Vitamins-Minerals (EMERGEN-C IMMUNE PO) Take by mouth.   Yes [provider]  nitroGLYCERIN (NITROSTAT) 0.4 MG SL tablet Place 0.4 mg under the tongue every 5 (five)  minutes x 3 doses as needed for chest pain. 04/09/14  Yes [provider]  olmesartan (BENICAR) 40 MG tablet Take 40 mg by mouth in the morning and at bedtime.   Yes [provider]  probenecid (BENEMID) 500 MG tablet Take 500 mg by mouth 2 (two) times daily.   Yes [provider]  rOPINIRole (REQUIP) 1 MG tablet Take 1 mg by mouth at bedtime.   Yes [provider]  sennosides-docusate sodium (SENOKOT-S) 8.6-50 MG tablet Take 2 tablets by mouth in the morning and at bedtime.   Yes [provider]  simvastatin (ZOCOR) 20 MG tablet Take 20 mg by mouth at bedtime. 04/24/19  Yes [provider]  solifenacin (VESICARE) 10 MG tablet Take 10 mg by mouth daily. 05/03/21  Yes [provider]  tamsulosin (FLOMAX) 0.4 MG CAPS capsule Take 0.4 mg by mouth at bedtime. 01/28/21  Yes [provider]  traMADol (ULTRAM) 50 MG tablet Take 50 mg by mouth 3 (three) times daily as needed. 05/06/22  Yes [provider]  amiodarone (PACERONE) 200 MG tablet Take 200 mg by mouth daily. Patient not taking: Reported on 05/26/2022 01/28/22   [provider]  docusate sodium (COLACE) 100 MG capsule Take 100 mg by mouth in the morning.    [provider]  levothyroxine (SYNTHROID) 50 MCG tablet Take 1 tablet (50 mcg total) by mouth daily at 6 (six) AM. Patient not taking: Reported on 01/05/2023 02/23/22   Sharen Hones, MD  oxyCODONE (OXY IR/ROXICODONE) 5 MG immediate release tablet Take 1-2 tablets (5-10 mg total) by mouth every 4 (four) hours as needed for moderate pain (pain score 4-6). Patient not taking: Reported on 05/26/2022 02/22/22   Sharen Hones, MD  sodium chloride 1 g tablet Take 1 tablet (1 g total) by mouth 2 (two) times daily with a meal. 02/22/22   Sharen Hones, MD  SYRINGE-NEEDLE, DISP, 3 ML 25G X 1" 3 ML MISC Use 1 Syringe monthly. 05/07/15   [provider]    Allergies as of 11/29/2022 - Review Complete 09/27/2022   Allergen Reaction Noted   Codeine Nausea And Vomiting and Nausea Only 07/13/2015   Tizanidine Other (See Comments) 06/07/2017    Family History  Problem Relation Age of Onset   Kidney cancer Maternal Uncle    Prostate cancer Neg Hx    Bladder Cancer Neg Hx     Social History   Socioeconomic History   Marital status: Married    Spouse name: Not on file   Number of children: Not on file   Years of education: Not on file   Highest education level: Not on file  Occupational History   Not on file  Tobacco Use   Smoking status: Never   Smokeless tobacco: Never  Vaping Use   Vaping Use: Never used  Substance and Sexual Activity   Alcohol use: No   Drug use: No   Sexual activity: Not on file  Other Topics Concern   Not on file  Social History Narrative   Not on file   Social Determinants of Health   Financial Resource Strain: Not on file  Food Insecurity: Not on file  Transportation Needs: Not on file  Physical Activity: Not on file  Stress: Not on file  Social Connections: Not on file  Intimate Partner Violence: Not on file    Review of Systems: See HPI, otherwise negative ROS  Physical Exam: Ht '6\' 2"'$  (1.88 m)   Wt 90.7 kg   BMI 25.68 kg/m  General:   Alert, cooperative in NAD Head:  Normocephalic and atraumatic. Respiratory:  Normal work of breathing. Cardiovascular:  RRR  Impression/Plan: [name] is here for cataract surgery.  Risks, benefits, limitations, and alternatives regarding cataract surgery have been reviewed with the patient.  Questions have been answered.  All parties agreeable.   Norvel Richards, MD  01/12/2023, 11:44 AM

## 2023-01-12 NOTE — Op Note (Signed)
OPERATIVE NOTE  JAREE TRINKA [id number] 01/12/2023   PREOPERATIVE DIAGNOSIS: Nuclear sclerotic cataract left eye. H25.12   POSTOPERATIVE DIAGNOSIS: Nuclear sclerotic cataract left eye. H25.12   PROCEDURE:  Phacoemusification with posterior chamber intraocular lens placement of the left eye  Ultrasound time: Procedure(s): CATARACT EXTRACTION PHACO AND INTRAOCULAR LENS PLACEMENT (IOC) LEFT COMPLICATED MALYUGIN OMIDRIA   12.02  01:30.8 (Left)  LENS:   Implant Name Type Inv. Item Serial No. Manufacturer Lot No. LRB No. Used Action  LENS IOL TECNIS EYHANCE 18.0 - [id number] Intraocular Lens LENS IOL TECNIS EYHANCE 18.0 [id number] [employer]  Left 1 Implanted      SURGEON:  Courtney Heys. Lazarus Salines, MD   ANESTHESIA:  Topical with tetracaine drops, augmented with 1% preservative-free intracameral lidocaine.   COMPLICATIONS:  None.   DESCRIPTION OF PROCEDURE:  The patient was identified in the holding room and transported to the operating room and placed in the supine position under the operating microscope.  The left eye was identified as the operative eye, which was prepped and draped in the usual sterile ophthalmic fashion.   A 1 millimeter clear-corneal paracentesis was made inferotemporally. Preservative-free 1% lidocaine mixed with 1:1,000 bisulfite-free aqueous solution of epinephrine was injected into the anterior chamber. The anterior chamber was then filled with Viscoat viscoelastic. A 2.4 millimeter keratome was used to make a clear-corneal incision superotemporally. A curvilinear capsulorrhexis was made with a cystotome and capsulorrhexis forceps. Balanced salt solution was used to hydrodissect and hydrodelineate the nucleus. Phacoemulsification was then used to remove the lens nucleus and epinucleus. The remaining cortex was then removed using the irrigation and aspiration handpiece. Provisc was then placed into the capsular bag to distend it for lens placement. A +18.00 D DIB00 intraocular  lens was then injected into the capsular bag. The remaining viscoelastic was aspirated.   Wounds were hydrated with balanced salt solution.  The anterior chamber was inflated to a physiologic pressure with balanced salt solution.  No wound leaks were noted. Vigamox was injected intracamerally.  Timolol and Brimonidine drops were applied to the eye.  The patient was taken to the recovery room in stable condition without complications of anesthesia or surgery.  Maryann Alar [place] 01/12/2023, 2:12 PM

## 2023-01-12 NOTE — Anesthesia Preprocedure Evaluation (Signed)
Anesthesia Evaluation  Patient identified by MRN, date of birth, ID band Patient awake    Reviewed: Allergy & Precautions, NPO status , Patient's Chart, lab work & pertinent test results  History of Anesthesia Complications Negative for: history of anesthetic complications  Airway Mallampati: III  TM Distance: >3 FB Neck ROM: Full    Dental  (+) Partial Upper, Poor Dentition, Dental Advidsory Given   Pulmonary neg pulmonary ROS, neg sleep apnea, neg COPD   breath sounds clear to auscultation- rhonchi (-) wheezing      Cardiovascular hypertension, Pt. on medications (-) angina + CAD, + Past MI, + Cardiac Stents (most recent stent 2011) and + CABG (1990s)  + dysrhythmias Atrial Fibrillation + Valvular Problems/Murmurs MR  Rhythm:Irregular Rate:Normal - Systolic murmurs and - Diastolic murmurs TTE 1950: NORMAL LEFT VENTRICULAR SYSTOLIC FUNCTION  NORMAL RIGHT VENTRICULAR SYSTOLIC FUNCTION  MODERATE MR + TR  NO VALVULAR STENOSIS  MODERATE BIATRIAL ENLARGEMENT  MILD RV ENLARGEMENT  MILD LVH  MILDLY DILATED AORTIC ROOT MEASURING UP TO 4.2 CM  MILDLY DILATED ASCENDING AORTA MEASURING UP TO 4.3 CM     Neuro/Psych neg Seizures negative neurological ROS  negative psych ROS   GI/Hepatic negative GI ROS, Neg liver ROS,,,  Endo/Other  negative endocrine ROSneg diabetes    Renal/GU Renal InsufficiencyRenal disease     Musculoskeletal  (+) Arthritis ,    Abdominal  (+) - obese  Peds  Hematology negative hematology ROS (+)   Anesthesia Other Findings Past Medical History: No date: A-fib ([place]) No date: Arthritis     Comment:  lower back 10/29/2020: Basal cell carcinoma     Comment:  Right post base of skull/neck No date: BPH (benign prostatic hyperplasia) No date: Cancer ([place])     Comment:  Skin Cancer No date: Chronic kidney disease     Comment:  Chronic Kidney Disease No date: Coronary artery disease No date:  Dysrhythmia     Comment:  Atrial Fibrillation; Supraventricular Tachycardia No date: Gout No date: Hearing aid worn     Comment:  has, does not wear 01/10/2008: Hx of melanoma in situ     Comment:  L upper back 5.0cm inf to base of neck, 5.0cm lat to               spine No date: Hyperlipidemia No date: Hypertension 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, old No date: Nocturia No date: Phlebitis 04/11/2019: Squamous cell carcinoma of skin     Comment:  SCC IS R foreearm distal 04/11/2019: Squamous cell carcinoma of skin     Comment:  SCC IS R forearm proximal 03/26/2018: Squamous cell carcinoma of skin     Comment:  SCC IS R forearm 01/01/2008: Squamous cell carcinoma of skin     Comment:  SCC IS R forearm No date: Wears dentures     Comment:  partial   Reproductive/Obstetrics                              Anesthesia Physical Anesthesia Plan  ASA: 3  Anesthesia Plan: MAC   Post-op Pain Management: Minimal or no pain anticipated   Induction: Intravenous  PONV Risk Score and Plan: 1 and Midazolam  Airway Management Planned: Nasal Cannula  Additional Equipment:   Intra-op Plan:   Post-operative Plan:   Informed Consent: I have reviewed the patients History and Physical, chart, labs and discussed the procedure including the risks, benefits and alternatives for the proposed  anesthesia with the patient or authorized representative who has indicated his/her understanding and acceptance.       Plan Discussed with: CRNA and Surgeon  Anesthesia Plan Comments: (Explained risks of anesthesia, including PONV, and rare emergencies such as cardiac events, respiratory problems, and allergic reactions, requiring invasive intervention. Discussed the role of CRNA in patient's perioperative care. Patient understands. )         Anesthesia Quick Evaluation

## 2023-01-12 NOTE — Transfer of Care (Signed)
Immediate Anesthesia Transfer of Care Note  [name] [facility]  Procedure(s) Performed: CATARACT EXTRACTION PHACO AND INTRAOCULAR LENS PLACEMENT (IOC) LEFT COMPLICATED MALYUGIN OMIDRIA   12.02  01:30.8 (Left: Eye)  Patient Location: PACU  Anesthesia Type: MAC  Level of Consciousness: awake, alert  and patient cooperative  Airway and Oxygen Therapy: Patient Spontanous Breathing and Patient connected to supplemental oxygen  Post-op Assessment: Post-op Vital signs reviewed, Patient's Cardiovascular Status Stable, Respiratory Function Stable, Patent Airway and No signs of Nausea or vomiting  Post-op Vital Signs: Reviewed and stable  Complications: No notable events documented.

## 2023-01-13 ENCOUNTER — Encounter: Payer: Self-pay | Admitting: Ophthalmology

## 2023-01-18 ENCOUNTER — Encounter: Payer: Self-pay | Admitting: Ophthalmology

## 2023-01-24 NOTE — Discharge Instructions (Signed)

## 2023-01-26 ENCOUNTER — Encounter
Admission: RE | Disposition: A | Discharge status: Home / Self Care | Payer: Self-pay | Source: Home / Self Care | Attending: Ophthalmology

## 2023-01-26 ENCOUNTER — Ambulatory Visit: Payer: Medicare Other | Admitting: Anesthesiology

## 2023-01-26 ENCOUNTER — Ambulatory Visit
Admission: RE | Admit: 2023-01-26 | Discharge: 2023-01-26 | Disposition: A | Discharge status: Home / Self Care | Payer: Medicare Other | Attending: Ophthalmology | Admitting: Ophthalmology

## 2023-01-26 ENCOUNTER — Other Ambulatory Visit: Payer: Self-pay

## 2023-01-26 DIAGNOSIS — Z793 Long term (current) use of hormonal contraceptives: Secondary | ICD-10-CM | POA: Diagnosis not present

## 2023-01-26 DIAGNOSIS — Z79899 Other long term (current) drug therapy: Secondary | ICD-10-CM | POA: Diagnosis not present

## 2023-01-26 DIAGNOSIS — N189 Chronic kidney disease, unspecified: Secondary | ICD-10-CM | POA: Insufficient documentation

## 2023-01-26 DIAGNOSIS — I129 Hypertensive chronic kidney disease with stage 1 through stage 4 chronic kidney disease, or unspecified chronic kidney disease: Secondary | ICD-10-CM | POA: Diagnosis not present

## 2023-01-26 DIAGNOSIS — Z79890 Hormone replacement therapy (postmenopausal): Secondary | ICD-10-CM | POA: Insufficient documentation

## 2023-01-26 DIAGNOSIS — I252 Old myocardial infarction: Secondary | ICD-10-CM | POA: Diagnosis not present

## 2023-01-26 DIAGNOSIS — N400 Enlarged prostate without lower urinary tract symptoms: Secondary | ICD-10-CM | POA: Insufficient documentation

## 2023-01-26 DIAGNOSIS — I2510 Atherosclerotic heart disease of native coronary artery without angina pectoris: Secondary | ICD-10-CM | POA: Insufficient documentation

## 2023-01-26 DIAGNOSIS — Z85828 Personal history of other malignant neoplasm of skin: Secondary | ICD-10-CM | POA: Diagnosis not present

## 2023-01-26 DIAGNOSIS — H2511 Age-related nuclear cataract, right eye: Secondary | ICD-10-CM | POA: Diagnosis present

## 2023-01-26 DIAGNOSIS — I4891 Unspecified atrial fibrillation: Secondary | ICD-10-CM | POA: Diagnosis not present

## 2023-01-26 DIAGNOSIS — K219 Gastro-esophageal reflux disease without esophagitis: Secondary | ICD-10-CM | POA: Diagnosis not present

## 2023-01-26 DIAGNOSIS — E785 Hyperlipidemia, unspecified: Secondary | ICD-10-CM | POA: Insufficient documentation

## 2023-01-26 DIAGNOSIS — Z951 Presence of aortocoronary bypass graft: Secondary | ICD-10-CM | POA: Diagnosis not present

## 2023-01-26 DIAGNOSIS — Z955 Presence of coronary angioplasty implant and graft: Secondary | ICD-10-CM | POA: Insufficient documentation

## 2023-01-26 DIAGNOSIS — Z7901 Long term (current) use of anticoagulants: Secondary | ICD-10-CM | POA: Diagnosis not present

## 2023-01-26 HISTORY — PX: CATARACT EXTRACTION W/PHACO: SHX586

## 2023-01-26 SURGERY — PHACOEMULSIFICATION, CATARACT, WITH IOL INSERTION
Anesthesia: Monitor Anesthesia Care | Site: Eye | Laterality: Right

## 2023-01-26 MED ORDER — LIDOCAINE HCL (PF) 2 % IJ SOLN
INTRAOCULAR | Status: DC | PRN
  Administered 2023-01-26: 1 mL via INTRAOCULAR

## 2023-01-26 MED ORDER — PHENYLEPHRINE-KETOROLAC 1-0.3 % IO SOLN
INTRAOCULAR | Status: DC | PRN
  Administered 2023-01-26: 82 mL via OPHTHALMIC

## 2023-01-26 MED ORDER — LACTATED RINGERS IV SOLN: INTRAVENOUS | Status: DC

## 2023-01-26 MED ORDER — MOXIFLOXACIN HCL 0.5 % OP SOLN
OPHTHALMIC | Status: DC | PRN
  Administered 2023-01-26: .2 mL via OPHTHALMIC

## 2023-01-26 MED ORDER — MIDAZOLAM HCL 2 MG/2ML IJ SOLN
INTRAMUSCULAR | Status: DC | PRN
  Administered 2023-01-26: 1 mg via INTRAVENOUS

## 2023-01-26 MED ORDER — BRIMONIDINE TARTRATE-TIMOLOL 0.2-0.5 % OP SOLN
OPHTHALMIC | Status: DC | PRN
  Administered 2023-01-26: 1 [drp] via OPHTHALMIC

## 2023-01-26 MED ORDER — ARMC OPHTHALMIC DILATING DROPS
1.0000 | OPHTHALMIC | Status: DC | PRN
  Administered 2023-01-26 (×3): 1 via OPHTHALMIC

## 2023-01-26 MED ORDER — FENTANYL CITRATE (PF) 100 MCG/2ML IJ SOLN
INTRAMUSCULAR | Status: DC | PRN
  Administered 2023-01-26: 50 ug via INTRAVENOUS

## 2023-01-26 MED ORDER — SIGHTPATH DOSE#1 NA HYALUR & NA CHOND-NA HYALUR IO KIT
PACK | INTRAOCULAR | Status: DC | PRN
  Administered 2023-01-26: 1 via OPHTHALMIC

## 2023-01-26 MED ORDER — TETRACAINE HCL 0.5 % OP SOLN
1.0000 [drp] | OPHTHALMIC | Status: DC | PRN
  Administered 2023-01-26 (×3): 1 [drp] via OPHTHALMIC

## 2023-01-26 MED ORDER — SIGHTPATH DOSE#1 BSS IO SOLN
INTRAOCULAR | Status: DC | PRN
  Administered 2023-01-26: 15 mL

## 2023-01-26 SURGICAL SUPPLY — 12 items
CATARACT SUITE SIGHTPATH (MISCELLANEOUS) ×1 IMPLANT
DISSECTOR HYDRO NUCLEUS 50X22 (MISCELLANEOUS) ×1 IMPLANT
DRSG TEGADERM 2-3/8X2-3/4 SM (GAUZE/BANDAGES/DRESSINGS) ×1 IMPLANT
FEE CATARACT SUITE SIGHTPATH (MISCELLANEOUS) ×1 IMPLANT
GLOVE SURG SYN 7.5  E (GLOVE) ×1
GLOVE SURG SYN 7.5 E (GLOVE) ×1 IMPLANT
GLOVE SURG SYN 7.5 PF PI (GLOVE) ×1 IMPLANT
GLOVE SURG SYN 8.5  E (GLOVE) ×1
GLOVE SURG SYN 8.5 E (GLOVE) ×1 IMPLANT
GLOVE SURG SYN 8.5 PF PI (GLOVE) ×1 IMPLANT
LENS IOL TECNIS EYHANCE 17.0 (Intraocular Lens) IMPLANT
WATER STERILE IRR 250ML POUR (IV SOLUTION) ×1 IMPLANT

## 2023-01-26 NOTE — Anesthesia Preprocedure Evaluation (Signed)
Anesthesia Evaluation  Patient identified by MRN, date of birth, ID band Patient awake    Reviewed: Allergy & Precautions, NPO status , Patient's Chart, lab work & pertinent test results  History of Anesthesia Complications Negative for: history of anesthetic complications  Airway Mallampati: III  TM Distance: <3 FB Neck ROM: full    Dental  (+) Chipped, Poor Dentition, Missing, Partial Upper   Pulmonary neg pulmonary ROS, neg shortness of breath   Pulmonary exam normal        Cardiovascular Exercise Tolerance: Good hypertension, + CAD and + Past MI  + dysrhythmias Atrial Fibrillation      Neuro/Psych  PSYCHIATRIC DISORDERS      negative neurological ROS     GI/Hepatic negative GI ROS, Neg liver ROS,neg GERD  ,,  Endo/Other  negative endocrine ROS    Renal/GU Renal disease     Musculoskeletal   Abdominal   Peds  Hematology negative hematology ROS (+)   Anesthesia Other Findings Past Medical History: No date: A-fib [facility]) No date: Arthritis     Comment:  lower back 10/29/2020: Basal cell carcinoma     Comment:  Right post base of skull/neck No date: BPH (benign prostatic hyperplasia) No date: Cancer ([place])     Comment:  Skin Cancer No date: Chronic kidney disease     Comment:  Chronic Kidney Disease No date: Coronary artery disease No date: Dysrhythmia     Comment:  Atrial Fibrillation; Supraventricular Tachycardia No date: Gout No date: Hearing aid worn     Comment:  has, does not wear 01/10/2008: Hx of melanoma in situ     Comment:  L upper back 5.0cm inf to base of neck, 5.0cm lat to               spine No date: Hyperlipidemia No date: Hypertension 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, old No date: Nocturia No date: Phlebitis 04/11/2019: Squamous cell carcinoma of skin     Comment:  SCC IS R foreearm distal 04/11/2019: Squamous cell carcinoma of skin     Comment:  SCC IS R forearm proximal 03/26/2018: Squamous cell  carcinoma of skin     Comment:  SCC IS R forearm 01/01/2008: Squamous cell carcinoma of skin     Comment:  SCC IS R forearm No date: Wears dentures     Comment:  partial No date: Wears hearing aid in both ears  Past Surgical History: No date: cardaic stents No date: CARDIAC CATHETERIZATION     Comment:  PTCA with Stent Placement 04/13/2021: CARDIOVERSION; N/A     Comment:  Procedure: CARDIOVERSION;  Surgeon: Corey Skains,               MD;  Location: [facility] ORS;  Service: Cardiovascular;                Laterality: N/A; 05/05/2021: CARDIOVERSION; N/A     Comment:  Procedure: CARDIOVERSION;  Surgeon: Corey Skains,               MD;  Location: [facility] ORS;  Service: Cardiovascular;                Laterality: N/A; 01/12/2023: CATARACT EXTRACTION W/PHACO; Left     Comment:  Procedure: CATARACT EXTRACTION PHACO AND INTRAOCULAR               LENS PLACEMENT (IOC) LEFT COMPLICATED MALYUGIN OMIDRIA                 12.02  01:30.8;  Surgeon: Norvel Richards, MD;                Location: [place];  Service: Ophthalmology;                Laterality: Left; 12/15/2015: COLONOSCOPY WITH PROPOFOL; N/A     Comment:  Procedure: COLONOSCOPY WITH PROPOFOL;  Surgeon: Lollie Sails, MD;  Location: [facility] ENDOSCOPY;  Service:               Endoscopy;  Laterality: N/A; 1992: CORONARY ARTERY BYPASS GRAFT 12/12/2017: ECTROPION REPAIR; Right     Comment:  Procedure: REPAIR OF ECTROPION SUTURES/EXTENSIVE RIGHT;               Surgeon: Karle Starch, MD;  Location: [place];  Service: Ophthalmology;  Laterality: Right; No date: HERNIA REPAIR 02/19/2022: HIP ARTHROPLASTY; Left     Comment:  Procedure: ARTHROPLASTY BIPOLAR HIP (HEMIARTHROPLASTY);               Surgeon: Thornton Park, MD;  Location: [facility] ORS;                Service: Orthopedics;  Laterality: Left; 06/07/2022: IR KYPHO LUMBAR INC FX REDUCE BONE BX UNI/BIL CANNULATION   INC/IMAGING 06/30/2022: IR RADIOLOGIST EVAL & MGMT 02/22/2017: SVT ABLATION     Comment:  [facility]  BMI    Body Mass Index: 27.59 kg/m      Reproductive/Obstetrics negative OB ROS                             Anesthesia Physical Anesthesia Plan  ASA: 3  Anesthesia Plan: MAC   Post-op Pain Management:    Induction: Intravenous  PONV Risk Score and Plan:   Airway Management Planned: Natural Airway and Nasal Cannula  Additional Equipment:   Intra-op Plan:   Post-operative Plan:   Informed Consent: I have reviewed the patients History and Physical, chart, labs and discussed the procedure including the risks, benefits and alternatives for the proposed anesthesia with the patient or authorized representative who has indicated his/her understanding and acceptance.     Dental Advisory Given  Plan Discussed with: Anesthesiologist, CRNA and Surgeon  Anesthesia Plan Comments: (Patient consented for risks of anesthesia including but not limited to:  - adverse reactions to medications - damage to eyes, teeth, lips or other oral mucosa - nerve damage due to positioning  - sore throat or hoarseness - Damage to heart, brain, nerves, lungs, other parts of body or loss of life  Patient voiced understanding.)       Anesthesia Quick Evaluation

## 2023-01-26 NOTE — Anesthesia Postprocedure Evaluation (Signed)
Anesthesia Post Note  [name] [facility]  Procedure(s) Performed: CATARACT EXTRACTION PHACO AND INTRAOCULAR LENS PLACEMENT (IOC) RIGHT OMIDRIA  COMPLICATED  2.75  17:00.1 (Right: Eye)  Patient location during evaluation: PACU Anesthesia Type: MAC Level of consciousness: awake and alert Pain management: pain level controlled Vital Signs Assessment: post-procedure vital signs reviewed and stable Respiratory status: spontaneous breathing, nonlabored ventilation and respiratory function stable Cardiovascular status: blood pressure returned to baseline and stable Postop Assessment: no apparent nausea or vomiting Anesthetic complications: no   No notable events documented.   Last Vitals:  Vitals:   01/26/23 1432 01/26/23 1436  BP: 123/72 129/73  Pulse: 74 (!) 58  Resp: 14 16  Temp: (!) 36.4 C (!) 36.4 C  SpO2: 97% 97%    Last Pain:  Vitals:   01/26/23 1436  TempSrc:   PainSc: 0-No pain                 Robert Rivas

## 2023-01-26 NOTE — Op Note (Signed)
OPERATIVE NOTE  KAYDN KUMPF [id number] 01/26/2023   PREOPERATIVE DIAGNOSIS: Nuclear sclerotic cataract right eye. H25.11   POSTOPERATIVE DIAGNOSIS: Nuclear sclerotic cataract right eye. H25.11   PROCEDURE:  Phacoemusification with posterior chamber intraocular lens placement of the right eye  Ultrasound time: Procedure(s): CATARACT EXTRACTION PHACO AND INTRAOCULAR LENS PLACEMENT (IOC) RIGHT OMIDRIA  COMPLICATED  0.76  22:63.3 (Right)  LENS:   Implant Name Type Inv. Item Serial No. Manufacturer Lot No. LRB No. Used Action  LENS IOL TECNIS EYHANCE 17.0 - [id number] Intraocular Lens LENS IOL TECNIS EYHANCE 17.0 [id number] [employer]  Right 1 Implanted      SURGEON:  Courtney Heys. Lazarus Salines, MD   ANESTHESIA:  Topical with tetracaine drops, augmented with 1% preservative-free intracameral lidocaine.   COMPLICATIONS:  None.   DESCRIPTION OF PROCEDURE:  The patient was identified in the holding room and transported to the operating room and placed in the supine position under the operating microscope.  The right eye was identified as the operative eye, which was prepped and draped in the usual sterile ophthalmic fashion.   A 1 millimeter clear-corneal paracentesis was made superotemporally. Preservative-free 1% lidocaine mixed with 1:1,000 bisulfite-free aqueous solution of epinephrine was injected into the anterior chamber. The anterior chamber was then filled with Viscoat viscoelastic. A 2.4 millimeter keratome was used to make a clear-corneal incision inferotemporally. A curvilinear capsulorrhexis was made with a cystotome and capsulorrhexis forceps. Balanced salt solution was used to hydrodissect and hydrodelineate the nucleus. Phacoemulsification was then used to remove the lens nucleus and epinucleus. The remaining cortex was then removed using the irrigation and aspiration handpiece. Provisc was then placed into the capsular bag to distend it for lens placement. A +17.00 D DIB00 intraocular lens  was then injected into the capsular bag. The remaining viscoelastic was aspirated.   Wounds were hydrated with balanced salt solution.  The anterior chamber was inflated to a physiologic pressure with balanced salt solution.  No wound leaks were noted. Vigamox was injected intracamerally.  Timolol and Brimonidine drops were applied to the eye.  The patient was taken to the recovery room in stable condition without complications of anesthesia or surgery.  Maryann Alar [place] 01/26/2023, 2:31 PM

## 2023-01-26 NOTE — Transfer of Care (Signed)
Immediate Anesthesia Transfer of Care Note  [name] [facility]  Procedure(s) Performed: CATARACT EXTRACTION PHACO AND INTRAOCULAR LENS PLACEMENT (IOC) RIGHT OMIDRIA  COMPLICATED  9.47  09:62.8 (Right: Eye)  Patient Location: PACU  Anesthesia Type: MAC  Level of Consciousness: awake, alert  and patient cooperative  Airway and Oxygen Therapy: Patient Spontanous Breathing and Patient connected to supplemental oxygen  Post-op Assessment: Post-op Vital signs reviewed, Patient's Cardiovascular Status Stable, Respiratory Function Stable, Patent Airway and No signs of Nausea or vomiting  Post-op Vital Signs: Reviewed and stable  Complications: No notable events documented.

## 2023-01-26 NOTE — H&P (Signed)
[facility]   Primary Care Physician:  Idelle Crouch, MD Ophthalmologist: Dr. Merleen Nicely  Pre-Procedure History & Physical: HPI:  Robert Rivas is a 82 y.o. male here for cataract surgery.   Past Medical History:  Diagnosis Date   A-fib [facility])    Arthritis    lower back   Basal cell carcinoma 10/29/2020   Right post base of skull/neck   BPH (benign prostatic hyperplasia)    Cancer ([facility])    Skin Cancer   Chronic kidney disease    Chronic Kidney Disease   Coronary artery disease    Dysrhythmia    Atrial Fibrillation; Supraventricular Tachycardia   Gout    Hearing aid worn    has, does not wear   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   L upper back 5.0cm inf to base of neck, 5.0cm lat to spine   Hyperlipidemia    Hypertension    MI, old    Nocturia    Phlebitis    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   SCC IS R foreearm distal   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   SCC IS R forearm proximal   Squamous cell carcinoma of skin 03/26/2018   SCC IS R forearm   Squamous cell carcinoma of skin 01/01/2008   SCC IS R forearm   Wears dentures    partial   Wears hearing aid in both ears     Past Surgical History:  Procedure Laterality Date   cardaic stents     CARDIAC CATHETERIZATION     PTCA with Stent Placement   CARDIOVERSION N/A 04/13/2021   Procedure: CARDIOVERSION;  Surgeon: Corey Skains, MD;  Location: [facility] ORS;  Service: Cardiovascular;  Laterality: N/A;   CARDIOVERSION N/A 05/05/2021   Procedure: CARDIOVERSION;  Surgeon: Corey Skains, MD;  Location: [facility] ORS;  Service: Cardiovascular;  Laterality: N/A;   CATARACT EXTRACTION W/PHACO Left 01/12/2023   Procedure: CATARACT EXTRACTION PHACO AND INTRAOCULAR LENS PLACEMENT ([place]) LEFT COMPLICATED MALYUGIN OMIDRIA   12.02  01:30.8;  Surgeon: Norvel Richards, MD;  Location: [place];  Service: Ophthalmology;  Laterality: Left;   COLONOSCOPY WITH PROPOFOL N/A 12/15/2015    Procedure: COLONOSCOPY WITH PROPOFOL;  Surgeon: Lollie Sails, MD;  Location: [facility] ENDOSCOPY;  Service: Endoscopy;  Laterality: N/A;   CORONARY ARTERY BYPASS GRAFT  1992   ECTROPION REPAIR Right 12/12/2017   Procedure: REPAIR OF ECTROPION SUTURES/EXTENSIVE RIGHT;  Surgeon: Karle Starch, MD;  Location: [place];  Service: Ophthalmology;  Laterality: Right;   HERNIA REPAIR     HIP ARTHROPLASTY Left 02/19/2022   Procedure: ARTHROPLASTY BIPOLAR HIP (HEMIARTHROPLASTY);  Surgeon: Thornton Park, MD;  Location: [facility] ORS;  Service: Orthopedics;  Laterality: Left;   IR KYPHO LUMBAR INC FX REDUCE BONE BX UNI/BIL CANNULATION INC/IMAGING  06/07/2022   IR RADIOLOGIST EVAL & MGMT  06/30/2022   SVT ABLATION  02/22/2017   [facility]    Prior to Admission medications   Medication Sig Start Date End Date Taking? Authorizing Provider  amiodarone (PACERONE) 200 MG tablet Take 200 mg by mouth daily. 01/28/22  Yes [provider]  amLODipine (NORVASC) 5 MG tablet Take 5 mg by mouth daily.   Yes [provider]  apixaban (ELIQUIS) 2.5 MG TABS tablet Take 2.5 mg by mouth 2 (two) times daily. 03/14/22  Yes [provider]  clonazePAM (KLONOPIN) 1 MG tablet Take 1 mg by mouth 2 (two) times daily as needed for  anxiety.   Yes [provider]  Cyanocobalamin (VITAMIN B-12 IJ) Inject 1,000 mcg as directed every 30 (thirty) days.   Yes [provider]  docusate sodium (COLACE) 100 MG capsule Take 100 mg by mouth in the morning.   Yes [provider]  doxazosin (CARDURA) 2 MG tablet Take 2 mg by mouth daily.   Yes [provider]  ezetimibe (ZETIA) 10 MG tablet Take 10 mg by mouth at bedtime.    Yes [provider]  furosemide (LASIX) 20 MG tablet Take 20 mg by mouth daily as needed.   Yes [provider]  hydrALAZINE (APRESOLINE) 50 MG tablet Take 50 mg by mouth 2 (two) times daily. 11/25/21  Yes [provider]   hydrochlorothiazide (HYDRODIURIL) 12.5 MG tablet Take 12.5 mg by mouth daily.   Yes [provider]  methocarbamol (ROBAXIN) 500 MG tablet Take 500 mg by mouth 2 (two) times daily.   Yes [provider]  montelukast (SINGULAIR) 10 MG tablet Take 10 mg by mouth at bedtime. 08/12/16  Yes [provider]  Multiple Vitamins-Minerals (EMERGEN-C IMMUNE PO) Take by mouth.   Yes [provider]  olmesartan (BENICAR) 40 MG tablet Take 40 mg by mouth in the morning and at bedtime.   Yes [provider]  probenecid (BENEMID) 500 MG tablet Take 500 mg by mouth 2 (two) times daily.   Yes [provider]  rOPINIRole (REQUIP) 1 MG tablet Take 1 mg by mouth at bedtime.   Yes [provider]  sennosides-docusate sodium (SENOKOT-S) 8.6-50 MG tablet Take 2 tablets by mouth in the morning and at bedtime.   Yes [provider]  simvastatin (ZOCOR) 20 MG tablet Take 20 mg by mouth at bedtime. 04/24/19  Yes [provider]  sodium chloride 1 g tablet Take 1 tablet (1 g total) by mouth 2 (two) times daily with a meal. 02/22/22  Yes Sharen Hones, MD  solifenacin (VESICARE) 10 MG tablet Take 10 mg by mouth daily. 05/03/21  Yes [provider]  tamsulosin (FLOMAX) 0.4 MG CAPS capsule Take 0.4 mg by mouth at bedtime. 01/28/21  Yes [provider]  traMADol (ULTRAM) 50 MG tablet Take 50 mg by mouth 3 (three) times daily as needed. 05/06/22  Yes [provider]  levothyroxine (SYNTHROID) 50 MCG tablet Take 1 tablet (50 mcg total) by mouth daily at 6 (six) AM. Patient not taking: Reported on 01/05/2023 02/23/22   Sharen Hones, MD  nitroGLYCERIN (NITROSTAT) 0.4 MG SL tablet Place 0.4 mg under the tongue every 5 (five) minutes x 3 doses as needed for chest pain. Patient not taking: Reported on 01/12/2023 04/09/14   [provider]  oxyCODONE (OXY IR/ROXICODONE) 5 MG immediate release tablet Take 1-2 tablets (5-10 mg total) by  mouth every 4 (four) hours as needed for moderate pain (pain score 4-6). Patient not taking: Reported on 05/26/2022 02/22/22   Sharen Hones, MD  SYRINGE-NEEDLE, DISP, 3 ML 25G X 1" 3 ML MISC Use 1 Syringe monthly. 05/07/15   [provider]    Allergies as of 11/29/2022 - Review Complete 09/27/2022  Allergen Reaction Noted   Codeine Nausea And Vomiting and Nausea Only 07/13/2015   Tizanidine Other (See Comments) 06/07/2017    Family History  Problem Relation Age of Onset   Kidney cancer Maternal Uncle    Prostate cancer Neg Hx    Bladder Cancer Neg Hx     Social History   Socioeconomic History  Marital status: Married    Spouse name: Not on file   Number of children: Not on file   Years of education: Not on file   Highest education level: Not on file  Occupational History   Not on file  Tobacco Use   Smoking status: Never   Smokeless tobacco: Never  Vaping Use   Vaping Use: Never used  Substance and Sexual Activity   Alcohol use: No   Drug use: No   Sexual activity: Not on file  Other Topics Concern   Not on file  Social History Narrative   Not on file   Social Determinants of Health   Financial Resource Strain: Not on file  Food Insecurity: Not on file  Transportation Needs: Not on file  Physical Activity: Not on file  Stress: Not on file  Social Connections: Not on file  Intimate Partner Violence: Not on file    Review of Systems: See HPI, otherwise negative ROS  Physical Exam: Ht 6' (1.829 m)   Wt 93.4 kg   BMI 27.94 kg/m  General:   Alert, cooperative in NAD Head:  Normocephalic and atraumatic. Respiratory:  Normal work of breathing. Cardiovascular:  RRR  Impression/Plan: [name] is here for cataract surgery.  Risks, benefits, limitations, and alternatives regarding cataract surgery have been reviewed with the patient.  Questions have been answered.  All parties agreeable.   Norvel Richards, MD  01/26/2023, 11:31 AM

## 2023-01-27 ENCOUNTER — Encounter: Payer: Self-pay | Admitting: Ophthalmology

## 2023-02-24 ENCOUNTER — Other Ambulatory Visit: Payer: Self-pay | Admitting: Internal Medicine

## 2023-02-24 DIAGNOSIS — I872 Venous insufficiency (chronic) (peripheral): Secondary | ICD-10-CM

## 2023-03-01 IMAGING — CR DG HIP (WITH OR WITHOUT PELVIS) 2-3V*L*
1 series · 3 of 3 positions shown · non-contrast
Comparison: None.

CLINICAL DATA: Recent fall with left hip pain, initial encounter

EXAM:
DG HIP (WITH OR WITHOUT PELVIS) 3V LEFT

[Series 1: dg hip unilat w or w/o pelvis 2-3 views  · non-contrast · 0.14mm/px · 3 of 3 slices shown]
[im 1/3]
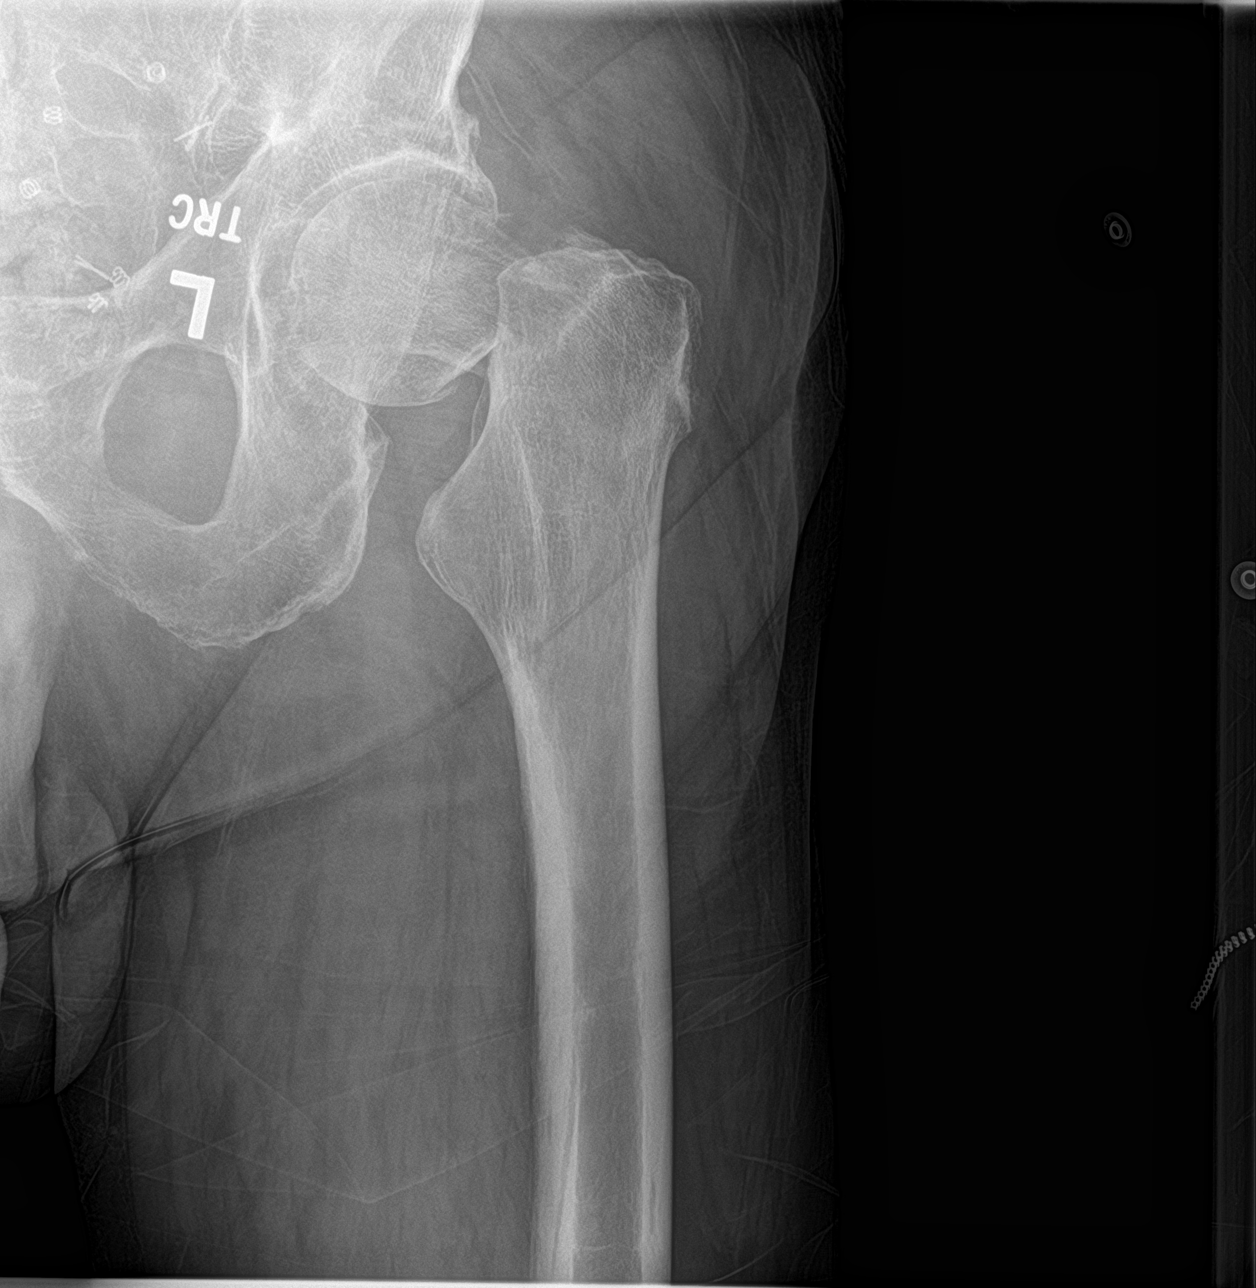
[im 2/3]
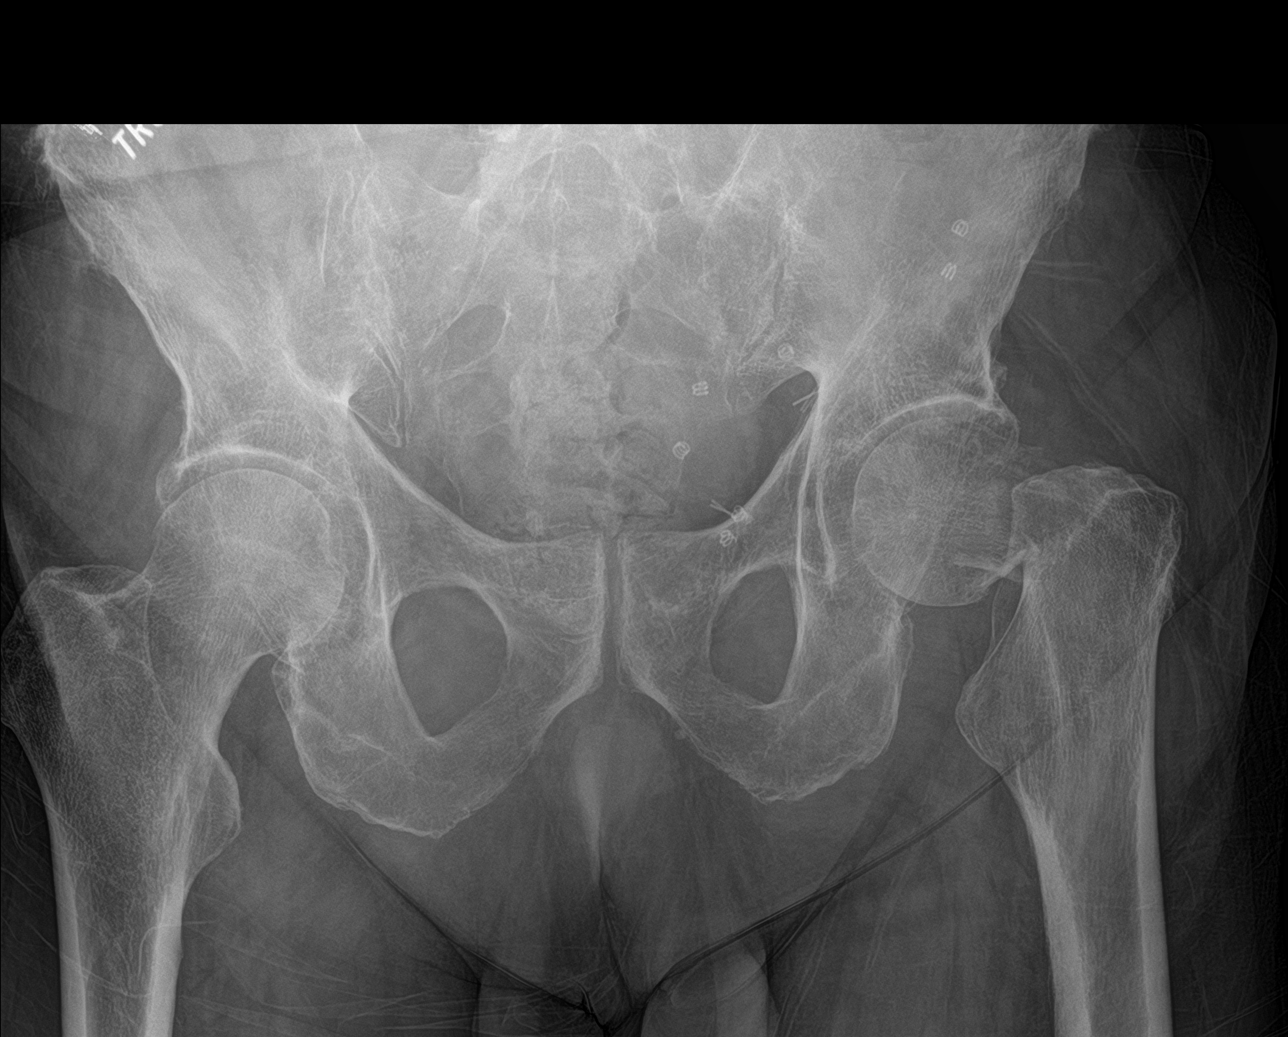
[im 3/3]
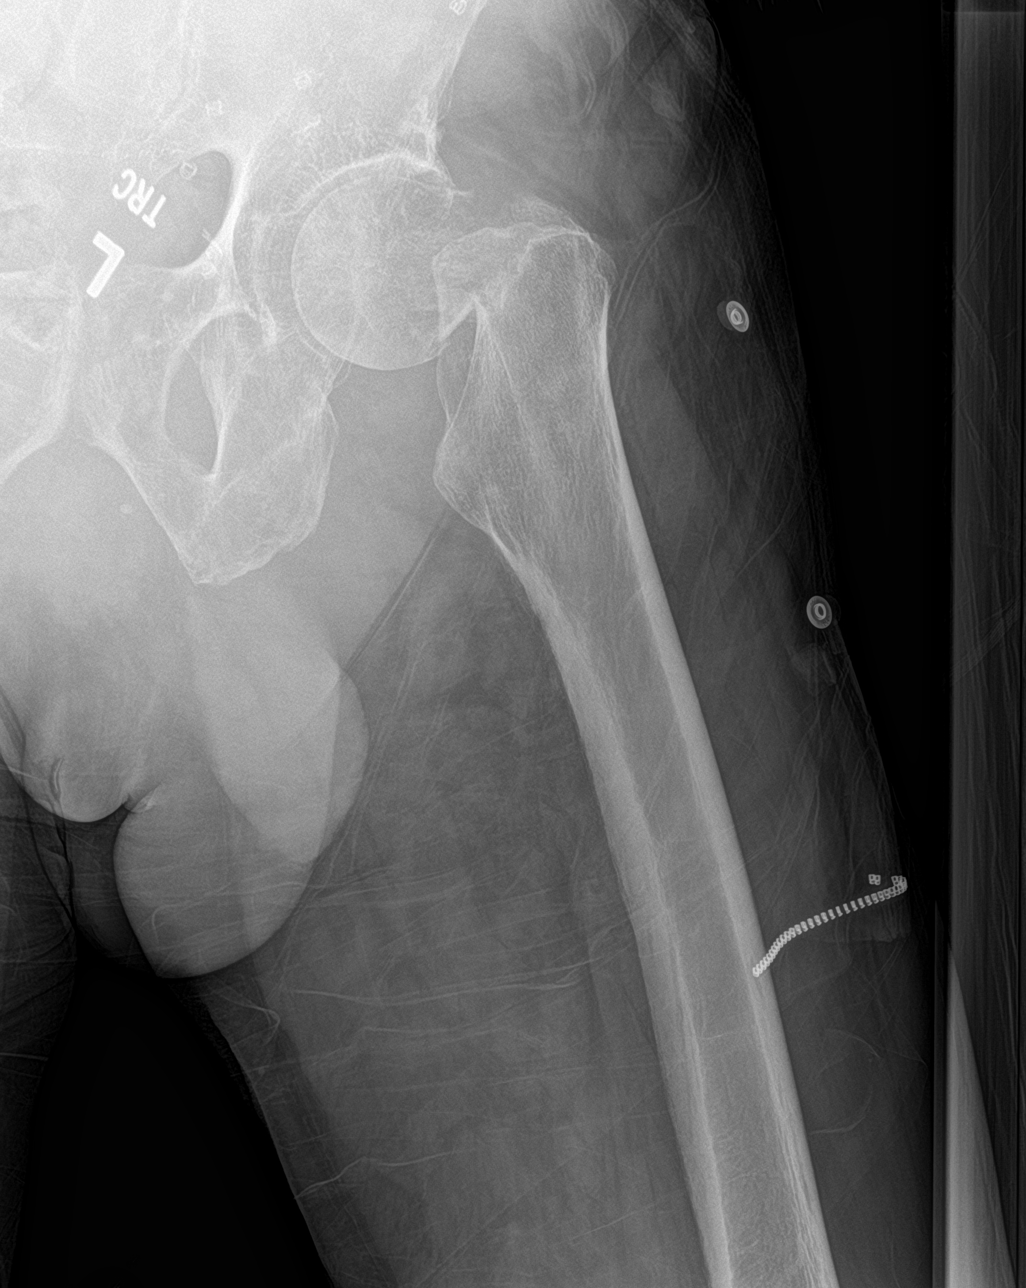

[3 of 3 positions shown; findings below may reference images not displayed]

FINDINGS: Pelvic ring is intact. Left subcapital femoral neck fracture is
noted with impaction and angulation at the fracture site. No other
fractures are seen. Postsurgical changes in the left lower quadrant
are noted.
IMPRESSION: Left subcapital femoral neck fracture is noted.

## 2023-03-01 IMAGING — CR DG CHEST 1V
1 series · 1 of 1 positions shown · non-contrast
Comparison: 04/12/2020

CLINICAL DATA: Recent trip and fall with chest pain, initial
encounter

EXAM:
PORTABLE CHEST 1 VIEW

[dg chest 1 view]
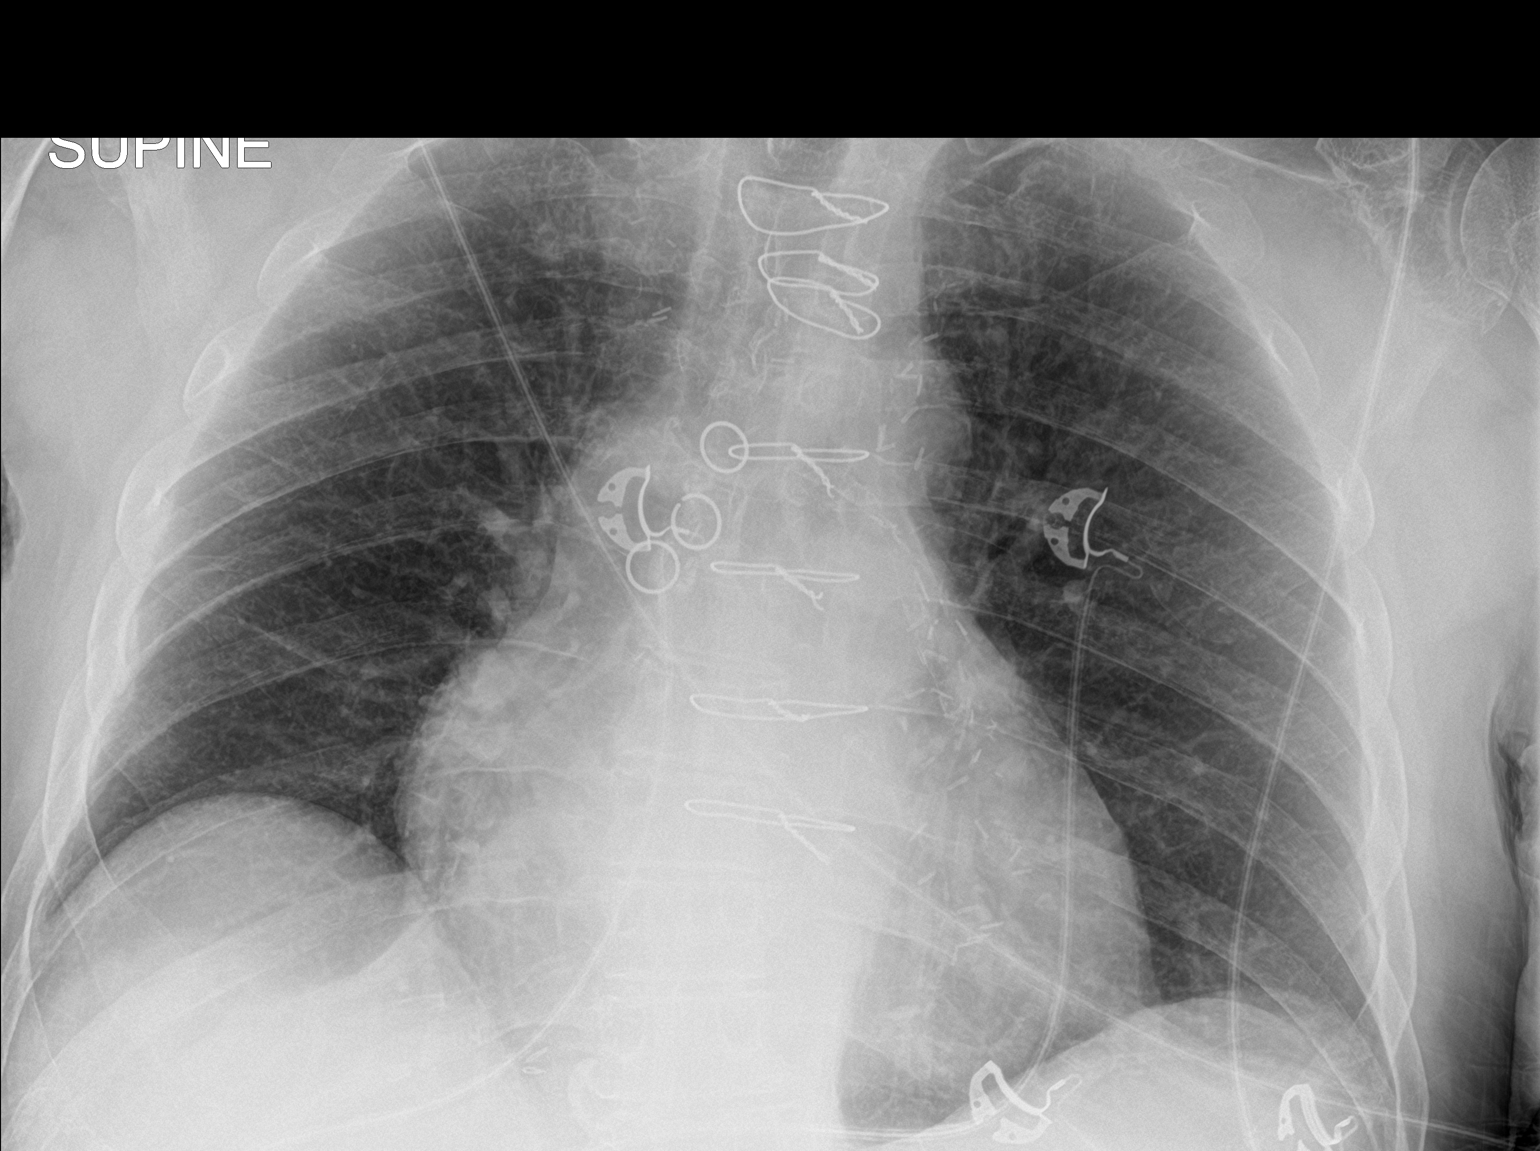

[1 of 1 positions shown; findings below may reference images not displayed]

FINDINGS: Cardiac shadow is enlarged but stable. Postsurgical changes are
again noted. The lungs are well aerated bilaterally. No focal
infiltrate or effusion is seen. No acute bony abnormality is noted.
IMPRESSION: No active disease.

## 2023-03-01 IMAGING — CT CT HEAD W/O CM
4 series · 16 of 47 positions shown, 18 images · non-contrast
Comparison: 12/31/2021

CLINICAL DATA: Recent trip and fall with headaches, initial
encounter



[Series 2: head wo · axial · 0.48mm/px · z∈[-63,+62]mm · 7 of 35 slices shown, 9 images]
[im 5/35  brain]
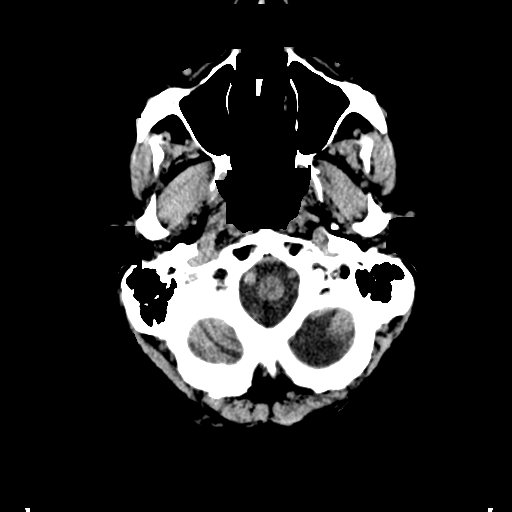
[im 5/35  bone]
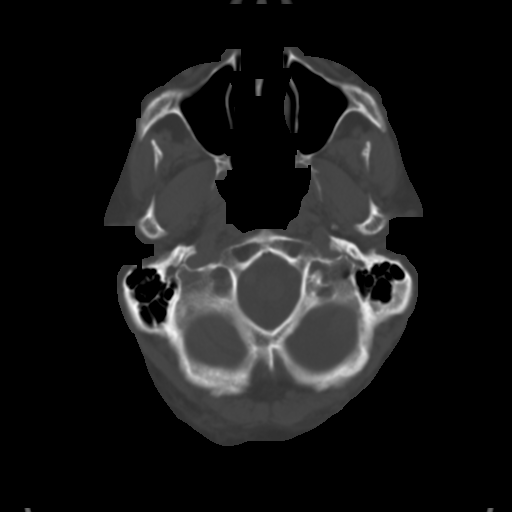
[im 9/35  brain]
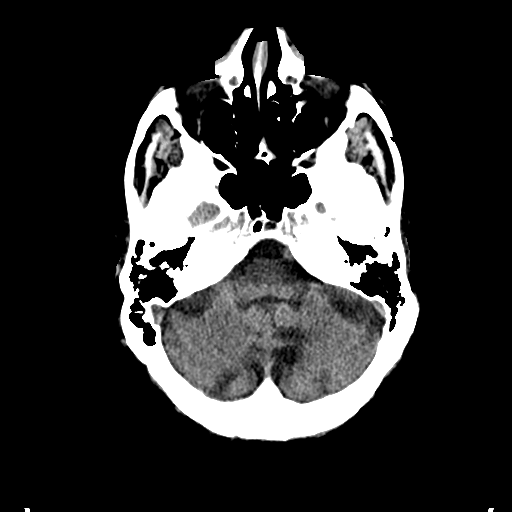
[im 13/35  brain]
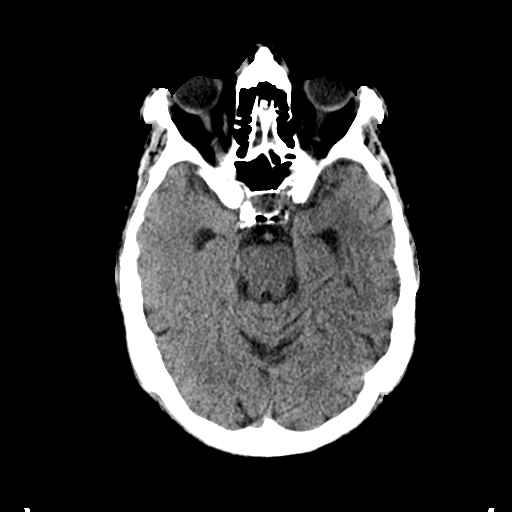
[im 18/35  brain]
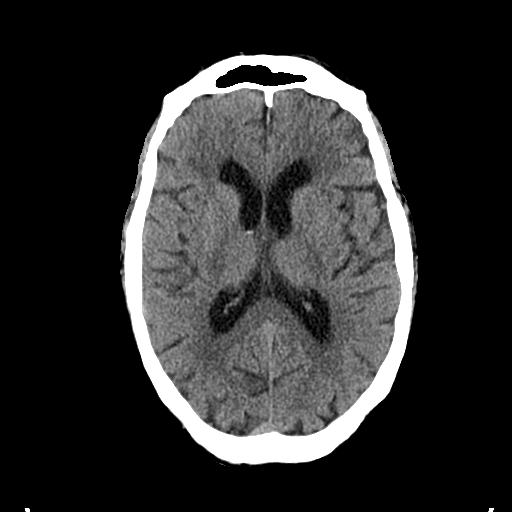
[im 22/35  brain]
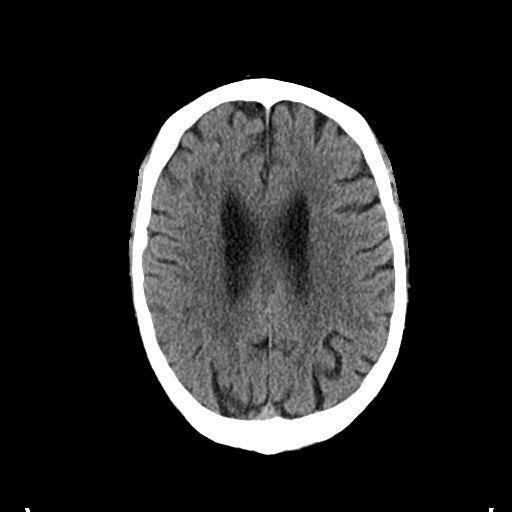
[im 22/35  bone]
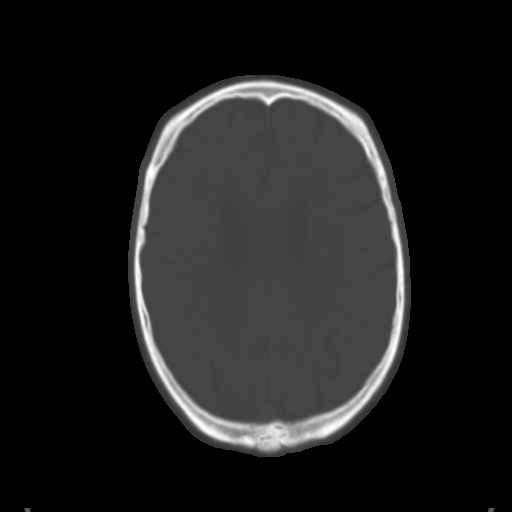
[im 26/35  brain]
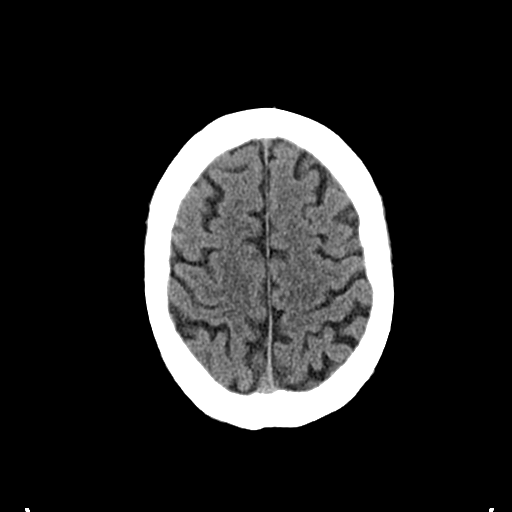
[im 30/35  brain]
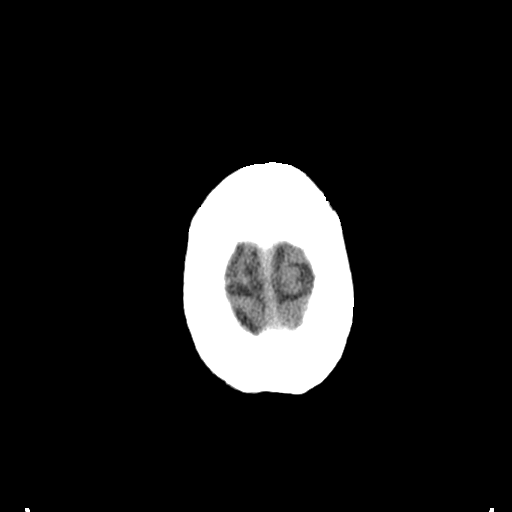

[Series 3: head bone · axial · 0.48mm/px · z∈[-67,-33]mm · 3 of 86 slices shown]
[im 9/86  bone]
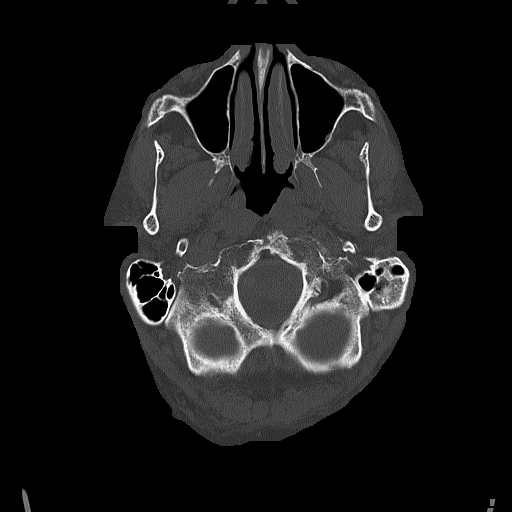
[im 18/86  bone]
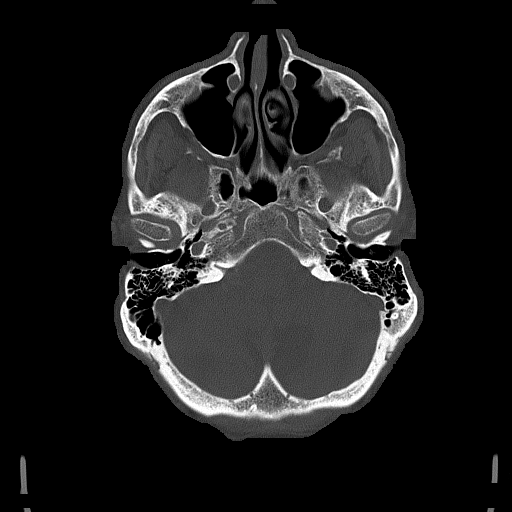
[im 26/86  bone]
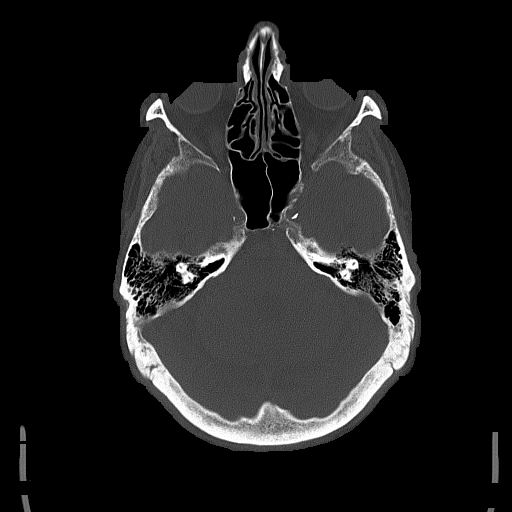

[Series 4: coronal soft tissue · coronal · 0.36mm/px · 3 of 78 slices shown]
[im 26/78  brain]
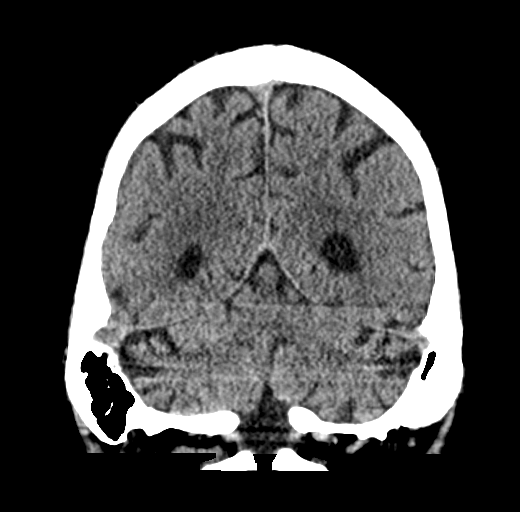
[im 35/78  brain]
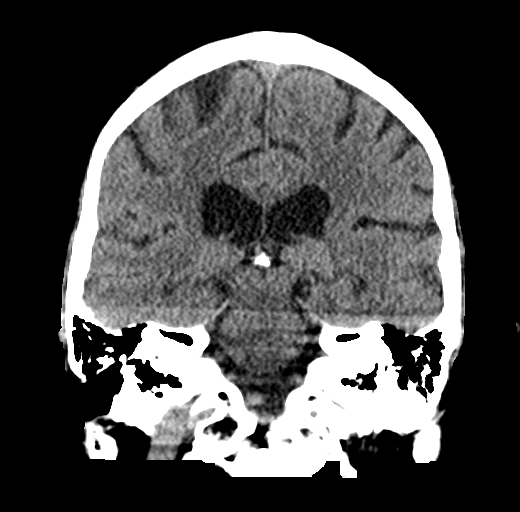
[im 43/78  brain]
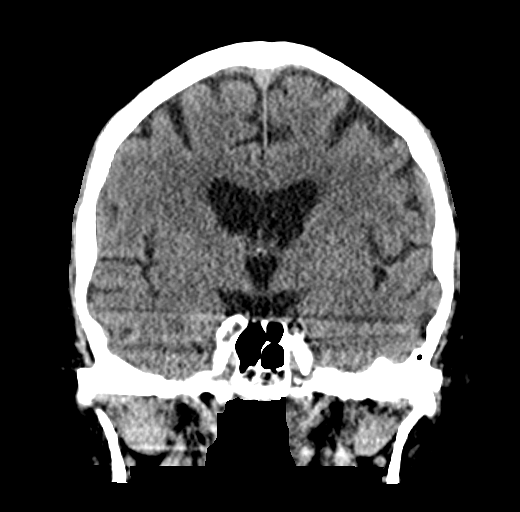

[Series 5: sagittal soft tissue · sagittal · 0.37mm/px · 3 of 61 slices shown]
[im 21/61  brain]
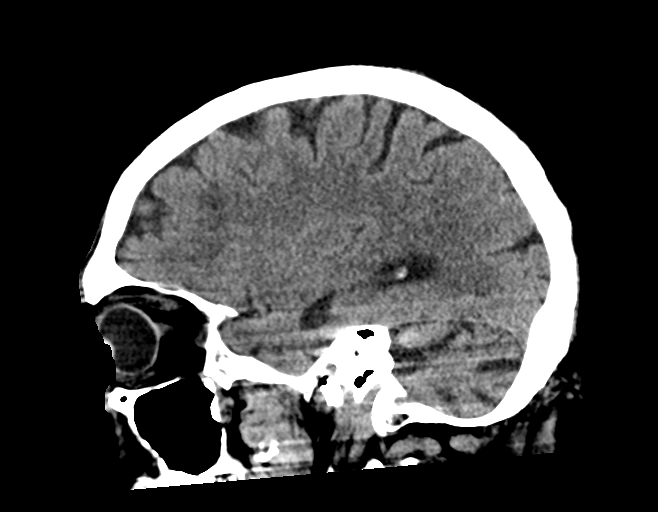
[im 31/61  brain]
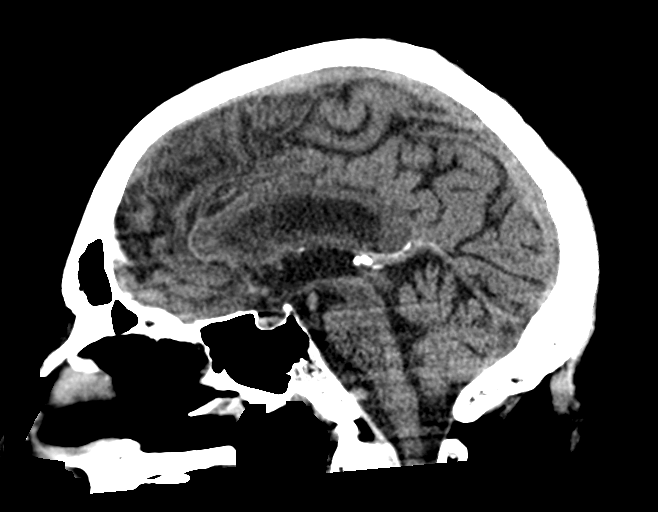
[im 41/61  brain]
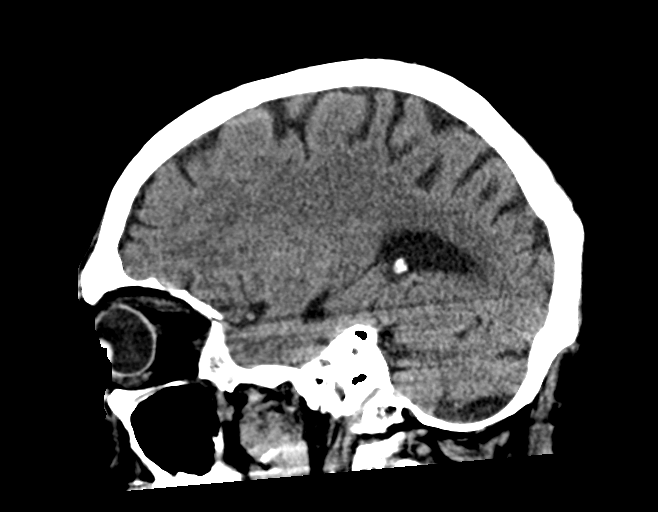

[16 of 47 positions shown; findings below may reference images not displayed]

FINDINGS: Brain: No evidence of acute infarction, hemorrhage, hydrocephalus,
extra-axial collection or mass lesion/mass effect. Stable cystic
change is noted in the left posterior fossa stable in appearance
from the prior exam. Scattered chronic white matter ischemic changes
are noted.

Vascular: No hyperdense vessel or unexpected calcification.

Skull: Normal. Negative for fracture or focal lesion.

Sinuses/Orbits: No acute finding.

Other: None.
IMPRESSION: Chronic changes without acute abnormality.

## 2023-03-02 ENCOUNTER — Ambulatory Visit (INDEPENDENT_AMBULATORY_CARE_PROVIDER_SITE_OTHER): Payer: Medicare Other | Admitting: Dermatology

## 2023-03-02 VITALS — BP 141/77 | HR 58

## 2023-03-02 DIAGNOSIS — L570 Actinic keratosis: Secondary | ICD-10-CM

## 2023-03-02 DIAGNOSIS — Z1283 Encounter for screening for malignant neoplasm of skin: Secondary | ICD-10-CM

## 2023-03-02 DIAGNOSIS — L814 Other melanin hyperpigmentation: Secondary | ICD-10-CM

## 2023-03-02 DIAGNOSIS — B351 Tinea unguium: Secondary | ICD-10-CM | POA: Diagnosis not present

## 2023-03-02 DIAGNOSIS — B353 Tinea pedis: Secondary | ICD-10-CM | POA: Diagnosis not present

## 2023-03-02 DIAGNOSIS — L820 Inflamed seborrheic keratosis: Secondary | ICD-10-CM

## 2023-03-02 DIAGNOSIS — Z85828 Personal history of other malignant neoplasm of skin: Secondary | ICD-10-CM

## 2023-03-02 DIAGNOSIS — Z8589 Personal history of malignant neoplasm of other organs and systems: Secondary | ICD-10-CM

## 2023-03-02 DIAGNOSIS — Z7189 Other specified counseling: Secondary | ICD-10-CM

## 2023-03-02 DIAGNOSIS — L578 Other skin changes due to chronic exposure to nonionizing radiation: Secondary | ICD-10-CM

## 2023-03-02 DIAGNOSIS — Z79899 Other long term (current) drug therapy: Secondary | ICD-10-CM

## 2023-03-02 DIAGNOSIS — Z86006 Personal history of melanoma in-situ: Secondary | ICD-10-CM

## 2023-03-02 DIAGNOSIS — D229 Melanocytic nevi, unspecified: Secondary | ICD-10-CM

## 2023-03-02 DIAGNOSIS — L821 Other seborrheic keratosis: Secondary | ICD-10-CM

## 2023-03-02 MED ORDER — KETOCONAZOLE 2 % EX CREA: 1.0000 | TOPICAL_CREAM | Freq: Every evening | CUTANEOUS | 11 refills | Status: AC

## 2023-03-02 NOTE — Patient Instructions (Addendum)
Cryotherapy Aftercare  Wash gently with soap and water everyday.   Apply Vaseline and Band-Aid daily until healed.   Photodynamic Therapy/Blue Light Therapy  Actinic keratoses are the dry, red scaly spots on the skin caused by sun damage. A portion of these spots can turn into skin cancer with time, and treating them can help prevent development of skin cancer.   Treatment of these spots requires removal of the defective skin cells. There are various ways to remove actinic keratoses, including freezing with liquid nitrogen, treatment with creams, or treatment with a blue light procedure in the office.   Photodynamic Therapy (PDT), also known as "blue light therapy" is an in office procedure used to treat actinic keratoses. It works by targeting precancerous cells. After treatment, these cells peel off and are replaced by healthy ones.   For your phototherapy appointment, you will have two appointments on the day of your treatment. The first appointment will be to apply a cream to the treatment area. You will leave this cream on for 1-2 hours depending on the area being treated. The second appointment will be to shine a blue light on the area for 16 minutes to kill off the precancer cells. It is common to experience a burning sensation during the treatment.  After your treatment, it will be important to keep the treated areas of skin out of the sun completely for 48-72 hours (2-3 days) to prevent having a reaction.   Common side effects include: - Burning or stinging, which may be severe and can last up to 24-72 hours after your treatment - Scaling and crusting which may last up to 2 weeks - Redness, swelling and/or peeling which can last up to 4 weeks  To Care for Your Skin After PDT/Blue Light Therapy: - Wash with soap, water and shampoo as normal. - If needed, you can use cold compresses (e.g. ice packs) for comfort - If okay with your primary care doctor, you may use analgesics such as  acetaminophen (tylenol) every 4-6 hours, not to exceed recommended dose - You may apply Cerave Healing Ointment, Vaseline or Aquaphor as needed - If you have a lot of swelling you may take a Benadryl to help with this (this may cause drowsiness), not to exceed recommended dose. This may increase the risk of falls in people over 65 and may slow reaction time while driving, so it is not recommended to take before driving or operating machinery. - Sun Precautions - Wear a wide brim hat for the next week if outside  - Wear a sunblock with zinc or titanium dioxide at least SPF 50 daily  If you have any questions or concerns, please call the office and ask to speak with a nurse.   --------------------------------------------------------------------------------------------------------------   Due to recent changes in healthcare laws, you may see results of your pathology and/or laboratory studies on MyChart before the doctors have had a chance to review them. We understand that in some cases there may be results that are confusing or concerning to you. Please understand that not all results are received at the same time and often the doctors may need to interpret multiple results in order to provide you with the best plan of care or course of treatment. Therefore, we ask that you please give [place] 2 business days to thoroughly review all your results before contacting the office for clarification. Should we see a critical lab result, you will be contacted sooner.   If You Need Anything After  Your Visit  If you have any questions or concerns for your doctor, please call our main line at [phone] and press option 4 to reach your doctor's medical assistant. If no one answers, please leave a voicemail as directed and we will return your call as soon as possible. Messages left after 4 pm will be answered the following business day.   You may also send [place] a message via [place]. We typically respond to MyChart  messages within 1-2 business days.  For prescription refills, please ask your pharmacy to contact our office. Our fax number is [phone].  If you have an urgent issue when the clinic is closed that cannot wait until the next business day, you can page your doctor at the number below.    Please note that while we do our best to be available for urgent issues outside of office hours, we are not available 24/7.   If you have an urgent issue and are unable to reach [place], you may choose to seek medical care at your doctor's office, retail clinic, urgent care center, or emergency room.  If you have a medical emergency, please immediately call 911 or go to the emergency department.  Pager Numbers  - Dr. Nehemiah Massed: [phone]  - Dr. Laurence Ferrari: [phone]  - Dr. Nicole Kindred: [phone]  In the event of inclement weather, please call our main line at [phone] for an update on the status of any delays or closures.  Dermatology Medication Tips: Please keep the boxes that topical medications come in in order to help keep track of the instructions about where and how to use these. Pharmacies typically print the medication instructions only on the boxes and not directly on the medication tubes.   If your medication is too expensive, please contact our office at [phone] option 4 or send [place] a message through [place].   We are unable to tell what your co-pay for medications will be in advance as this is different depending on your insurance coverage. However, we may be able to find a substitute medication at lower cost or fill out paperwork to get insurance to cover a needed medication.   If a prior authorization is required to get your medication covered by your insurance company, please allow [place] 1-2 business days to complete this process.  Drug prices often vary depending on where the prescription is filled and some pharmacies may offer cheaper prices.  The website www.goodrx.com contains  coupons for medications through different pharmacies. The prices here do not account for what the cost may be with help from insurance (it may be cheaper with your insurance), but the website can give you the price if you did not use any insurance.  - You can print the associated coupon and take it with your prescription to the pharmacy.  - You may also stop by our office during regular business hours and pick up a GoodRx coupon card.  - If you need your prescription sent electronically to a different pharmacy, notify our office through [facility] or by phone at [phone] option 4.     Si Usted Necesita Algo Despus de Su Visita  Tambin puede enviarnos un mensaje a travs de Pharmacist, community. Por lo general respondemos a los mensajes de MyChart en el transcurso de 1 a 2 das hbiles.  Para renovar recetas, por favor pida a su farmacia que se ponga en contacto con nuestra oficina. Harland Dingwall de fax es [place] [phone].  Si tiene un asunto urgente cuando  la clnica est cerrada y que no puede esperar hasta el siguiente da hbil, puede llamar/localizar a su doctor(a) al nmero que aparece a continuacin.   Por favor, tenga en cuenta que aunque hacemos todo lo posible para estar disponibles para asuntos urgentes fuera del horario de [place], no estamos disponibles las 24 horas del da, los 7 das de la [place].   Si tiene un problema urgente y no puede comunicarse con nosotros, puede optar por buscar atencin mdica  en el consultorio de su doctor(a), en una clnica privada, en un centro de atencin urgente o en una sala de emergencias.  Si tiene Engineering geologist, por favor llame inmediatamente al 911 o vaya a la sala de emergencias.  Nmeros de bper  - Dr. Nehemiah Massed: [phone]  - Dra. Moye: [phone]  - Dra. Nicole Kindred: [phone]  En caso de inclemencias del [place], por favor llame a Johnsie Kindred principal al [phone] para una actualizacin sobre el [place] de  cualquier retraso o cierre.  Consejos para la medicacin en dermatologa: Por favor, guarde las cajas en las que vienen los medicamentos de uso tpico para ayudarle a seguir las instrucciones sobre dnde y cmo usarlos. Las farmacias generalmente imprimen las instrucciones del medicamento slo en las cajas y no directamente en los tubos del [place].   Si su medicamento es muy caro, por favor, pngase en contacto con Zigmund Daniel llamando al [phone] y presione la opcin 4 o envenos un mensaje a travs de Pharmacist, community.   No podemos decirle cul ser su copago por los medicamentos por adelantado ya que esto es diferente dependiendo de la cobertura de su seguro. Sin embargo, es posible que podamos encontrar un medicamento sustituto a Electrical engineer un formulario para que el seguro cubra el medicamento que se considera necesario.   Si se requiere una autorizacin previa para que su compaa de seguros [place] su medicamento, por favor permtanos de 1 a 2 das hbiles para completar este proceso.  Los precios de los medicamentos varan con frecuencia dependiendo del Environmental consultant de dnde se surte la receta y alguna farmacias pueden ofrecer precios ms baratos.  El sitio web www.goodrx.com tiene cupones para medicamentos de Airline pilot. Los precios aqu no tienen en cuenta lo que podra costar con la ayuda del seguro (puede ser ms barato con su seguro), pero el sitio web puede darle el precio si no utiliz Research scientist (physical sciences).  - Puede imprimir el cupn correspondiente y llevarlo con su receta a la farmacia.  - Tambin puede pasar por nuestra oficina durante el horario de atencin regular y Charity fundraiser una tarjeta de cupones de GoodRx.  - Si necesita que su receta se enve electrnicamente a una farmacia diferente, informe a nuestra oficina a travs de MyChart de [place] o por telfono llamando al [phone] y presione la opcin 4.

## 2023-03-02 NOTE — Progress Notes (Signed)
Follow-Up Visit   Subjective  Robert Rivas is a 82 y.o. male who presents for the following: Total body skin exam (Hx of Melanoma IS, BCC, SCCs, AKs). The patient presents for Total-Body Skin Exam (TBSE) for skin cancer screening and mole check.  The patient has spots, moles and lesions to be evaluated, some may be new or changing and the patient has concerns that these could be cancer.   The following portions of the chart were reviewed this encounter and updated as appropriate:   Tobacco  Allergies  Meds  Problems  Med Hx  Surg Hx  Fam Hx     Review of Systems:  No other skin or systemic complaints except as noted in HPI or Assessment and Plan.  Objective  Well appearing patient in no apparent distress; mood and affect are within normal limits.  A full examination was performed including scalp, head, eyes, ears, nose, lips, neck, chest, axillae, abdomen, back, buttocks, bilateral upper extremities, bilateral lower extremities, hands, feet, fingers, toes, fingernails, and toenails. All findings within normal limits unless otherwise noted below.  scalp, face x 20 Pink scaly macules  L arm x 1, L hand x 1, back x 2 (4) Stuck on waxy paps with erythema  bil feet Toenail dystrophy, scaling feet   Assessment & Plan   History of Basal Cell Carcinoma of the Skin - No evidence of recurrence today - Recommend regular full body skin exams - Recommend daily broad spectrum sunscreen SPF 30+ to sun-exposed areas, reapply every 2 hours as needed.  - Call if any new or changing lesions are noted between office visits  - R post base of neck  History of Squamous Cell Carcinoma of the Skin - No evidence of recurrence today - No lymphadenopathy - Recommend regular full body skin exams - Recommend daily broad spectrum sunscreen SPF 30+ to sun-exposed areas, reapply every 2 hours as needed.  - Call if any new or changing lesions are noted between office visits   Lentigines -  Scattered tan macules - Due to sun exposure - Benign-appearing, observe - Recommend daily broad spectrum sunscreen SPF 30+ to sun-exposed areas, reapply every 2 hours as needed. - Call for any changes  Seborrheic Keratoses - Stuck-on, waxy, tan-brown papules and/or plaques  - Benign-appearing - Discussed benign etiology and prognosis. - Observe - Call for any changes  Melanocytic Nevi - Tan-brown and/or pink-flesh-colored symmetric macules and papules - Benign appearing on exam today - Observation - Call clinic for new or changing moles - Recommend daily use of broad spectrum spf 30+ sunscreen to sun-exposed areas.   Hemangiomas - Red papules - Discussed benign nature - Observe - Call for any changes  Actinic Damage with PreCancerous Actinic Keratoses Counseling for Topical Chemotherapy Management: Patient exhibits: - Severe, confluent actinic changes with pre-cancerous actinic keratoses that is secondary to cumulative UV radiation exposure over time - Condition that is severe; chronic, not at goal. - diffuse scaly erythematous macules and papules with underlying dyspigmentation - Discussed Prescription "Field Treatment" topical Chemotherapy for Severe, Chronic Confluent Actinic Changes with Pre-Cancerous Actinic Keratoses Field treatment involves treatment of an entire area of skin that has confluent Actinic Changes (Sun/ Ultraviolet light damage) and PreCancerous Actinic Keratoses by method of PhotoDynamic Therapy (PDT) and/or prescription Topical Chemotherapy agents such as 5-fluorouracil, 5-fluorouracil/calcipotriene, and/or imiquimod.  The purpose is to decrease the number of clinically evident and subclinical PreCancerous lesions to prevent progression to development of skin cancer by chemically destroying  early precancer changes that may or may not be visible.  It has been shown to reduce the risk of developing skin cancer in the treated area. As a result of treatment,  redness, scaling, crusting, and open sores may occur during treatment course. One or more than one of these methods may be used and may have to be used several times to control, suppress and eliminate the PreCancerous changes. Discussed treatment course, expected reaction, and possible side effects. - Recommend daily broad spectrum sunscreen SPF 30+ to sun-exposed areas, reapply every 2 hours as needed.  - Staying in the shade or wearing long sleeves, sun glasses (UVA+UVB protection) and wide brim hats (4-inch brim around the entire circumference of the hat) are also recommended. - Call for new or changing lesions.  - Recommend Red Light with debridement to scalp  Skin cancer screening performed today.   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 Observe for recurrence.  No lymphadenopathy.  Call clinic for new or changing lesions.  Recommend regular skin exams, daily broad-spectrum spf 30+ sunscreen use, and photoprotection.    AK (actinic keratosis) scalp, face x 20 Destruction of lesion - scalp, face x 20 Complexity: simple   Destruction method: cryotherapy   Informed consent: discussed and consent obtained   Timeout:  patient name, date of birth, surgical site, and procedure verified Lesion destroyed using liquid nitrogen: Yes   Region frozen until ice ball extended beyond lesion: Yes   Outcome: patient tolerated procedure well with no complications   Post-procedure details: wound care instructions given    Inflamed seborrheic keratosis (4) L arm x 1, L hand x 1, back x 2 Symptomatic, irritating, patient would like treated. 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 Condition is symptomatic / bothersome to patient. Not to goal.   Start Ketoconazole 2% cr qhs to feet  ketoconazole (NIZORAL) 2 % cream - bil feet Apply 1 Application topically at bedtime. Qhs to feet  Return for 4-6 wk Red light with debridement to scalp, 25mAK f/u .  I, SOthelia Pulling RMA, am acting as scribe for DSarina Ser MD . Documentation: I have reviewed the above documentation for accuracy and completeness, and I agree with the above.  DSarina Ser MD

## 2023-03-05 ENCOUNTER — Encounter: Payer: Self-pay | Admitting: Dermatology

## 2023-03-16 ENCOUNTER — Ambulatory Visit
Admission: RE | Admit: 2023-03-16 | Discharge: 2023-03-16 | Disposition: A | Discharge status: Home / Self Care | Payer: Medicare Other | Source: Ambulatory Visit | Attending: Internal Medicine | Admitting: Internal Medicine

## 2023-03-16 DIAGNOSIS — I872 Venous insufficiency (chronic) (peripheral): Secondary | ICD-10-CM

## 2023-03-20 ENCOUNTER — Ambulatory Visit (INDEPENDENT_AMBULATORY_CARE_PROVIDER_SITE_OTHER): Payer: Medicare Other | Admitting: Vascular Surgery

## 2023-03-20 ENCOUNTER — Encounter (INDEPENDENT_AMBULATORY_CARE_PROVIDER_SITE_OTHER): Payer: Self-pay | Admitting: Vascular Surgery

## 2023-03-20 VITALS — BP 166/73 | HR 69 | Resp 16 | Ht 73.0 in | Wt 220.0 lb

## 2023-03-20 DIAGNOSIS — I10 Essential (primary) hypertension: Secondary | ICD-10-CM | POA: Diagnosis not present

## 2023-03-20 DIAGNOSIS — I872 Venous insufficiency (chronic) (peripheral): Secondary | ICD-10-CM | POA: Diagnosis not present

## 2023-03-20 DIAGNOSIS — I2510 Atherosclerotic heart disease of native coronary artery without angina pectoris: Secondary | ICD-10-CM

## 2023-03-20 DIAGNOSIS — I890 Lymphedema, not elsewhere classified: Secondary | ICD-10-CM | POA: Diagnosis not present

## 2023-03-20 DIAGNOSIS — I2583 Coronary atherosclerosis due to lipid rich plaque: Secondary | ICD-10-CM

## 2023-03-20 DIAGNOSIS — I4821 Permanent atrial fibrillation: Secondary | ICD-10-CM

## 2023-03-20 NOTE — Progress Notes (Signed)
MRN : [id number]  Robert Rivas is a 82 y.o. (March 25, 1941) male who presents with chief complaint of legs hurt and swell.  History of Present Illness:   Patient is seen for evaluation of leg swelling. The patient first noticed the swelling remotely but is now concerned because of a significant increase in the overall edema. The swelling isn't associated with significant pain.  There has been an increasing amount of  discoloration noted by the patient. The patient notes that in the morning the legs are improved but they steadily worsened throughout the course of the day. Elevation seems to make the swelling of the legs better, dependency makes them much worse.   There is no history of ulcerations associated with the swelling.   The patient denies any recent changes in their medications.  The patient has not been wearing graduated compression.  The patient has no had any past angiography, interventions or vascular surgery.  The patient denies a history of DVT or PE. There is no prior history of phlebitis. There is no history of primary lymphedema.  There is no history of radiation treatment to the groin or pelvis No history of malignancies. No history of trauma or groin or pelvic surgery. No history of foreign travel or parasitic infections area   Duplex ultrasound of the right lower extremity venous system from [facility] dated March 16, 2023 is negative for DVT.  Significant soft tissue edema is noted.  Current Meds  Medication Sig   amiodarone (PACERONE) 200 MG tablet Take 200 mg by mouth daily.   amLODipine (NORVASC) 5 MG tablet Take 5 mg by mouth daily.   apixaban (ELIQUIS) 2.5 MG TABS tablet Take 2.5 mg by mouth 2 (two) times daily.   clonazePAM (KLONOPIN) 1 MG tablet Take 1 mg by mouth 2 (two) times daily as needed for anxiety.   Cyanocobalamin (VITAMIN B-12 IJ) Inject 1,000 mcg as directed every 30 (thirty) days.   docusate sodium (COLACE) 100  MG capsule Take 100 mg by mouth in the morning.   doxazosin (CARDURA) 2 MG tablet Take 2 mg by mouth daily.   ezetimibe (ZETIA) 10 MG tablet Take 10 mg by mouth at bedtime.    furosemide (LASIX) 20 MG tablet Take 20 mg by mouth daily as needed.   hydrALAZINE (APRESOLINE) 50 MG tablet Take 50 mg by mouth 2 (two) times daily.   hydrochlorothiazide (HYDRODIURIL) 12.5 MG tablet Take 12.5 mg by mouth daily.   ketoconazole (NIZORAL) 2 % cream Apply 1 Application topically at bedtime. Qhs to feet   methocarbamol (ROBAXIN) 500 MG tablet Take 500 mg by mouth 2 (two) times daily.   montelukast (SINGULAIR) 10 MG tablet Take 10 mg by mouth at bedtime.   Multiple Vitamins-Minerals (EMERGEN-C IMMUNE PO) Take by mouth.   olmesartan (BENICAR) 40 MG tablet Take 40 mg by mouth in the morning and at bedtime.   probenecid (BENEMID) 500 MG tablet Take 500 mg by mouth 2 (two) times daily.   rOPINIRole (REQUIP) 1 MG tablet Take 1 mg by mouth at bedtime.   sennosides-docusate sodium (SENOKOT-S) 8.6-50 MG tablet Take 2 tablets by mouth in the morning and at bedtime.   simvastatin (ZOCOR) 20 MG tablet Take 20 mg by mouth at bedtime.   sodium chloride 1 g tablet Take 1 tablet (1 g total) by mouth 2 (two) times daily with a meal.   SYRINGE-NEEDLE, DISP, 3 ML 25G X  1" 3 ML MISC Use 1 Syringe monthly.    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   L upper back 5.0cm inf to base of neck, 5.0cm lat to spine   Hyperlipidemia    Hypertension    MI, old    Nocturia    Phlebitis    Squamous cell carcinoma of skin  04/11/2019   SCC IS R foreearm distal   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   SCC IS R forearm proximal   Squamous cell carcinoma of skin 03/26/2018   SCC IS R forearm   Squamous cell carcinoma of skin 01/01/2008   SCC IS R forearm   Wears dentures    partial   Wears hearing aid in both ears     Past Surgical History:  Procedure Laterality Date   cardaic stents     CARDIAC CATHETERIZATION     PTCA with Stent Placement   CARDIOVERSION N/A 04/13/2021   Procedure: CARDIOVERSION;  Surgeon: Corey Skains, MD;  Location: [facility] ORS;  Service: Cardiovascular;  Laterality: N/A;   CARDIOVERSION N/A 05/05/2021   Procedure: CARDIOVERSION;  Surgeon: Corey Skains, MD;  Location: [facility];  Service: Cardiovascular;  Laterality: N/A;   CATARACT EXTRACTION W/PHACO Left 01/12/2023   Procedure: CATARACT EXTRACTION PHACO AND INTRAOCULAR LENS PLACEMENT ([place]) LEFT COMPLICATED MALYUGIN OMIDRIA   12.02  01:30.8;  Surgeon: Norvel Richards, MD;  Location: [place];  Service: Ophthalmology;  Laterality: Left;   CATARACT EXTRACTION W/PHACO Right 01/26/2023   Procedure: CATARACT EXTRACTION PHACO AND INTRAOCULAR LENS PLACEMENT ([place]) RIGHT OMIDRIA  COMPLICATED  [id number]  [id number];  Surgeon: Norvel Richards, MD;  Location: [place];  Service: Ophthalmology;  Laterality: Right;   COLONOSCOPY WITH PROPOFOL N/A 12/15/2015   Procedure: COLONOSCOPY WITH PROPOFOL;  Surgeon: Lollie Sails, MD;  Location: [facility] END[facility]COPY;  Service: Endoscopy;  Laterality: N/A;   CORONARY ARTERY BYPASS GRAFT  1992   ECTROPION REPAIR Right 12/12/2017   Procedure: REPAIR OF ECTROPION SUTURES/EXTENSIVE RIGHT;  Surgeon: Karle Starch, MD;  Location: [place];  Service: Ophthalmology;  Laterality: Right;   HERNIA REPAIR     HIP ARTHROPLASTY Left 02/19/2022   Procedure: ARTHROPLASTY BIPOLAR HIP (HEMIARTHROPLASTY);  Surgeon: Thornton Park, MD;  Location: [facility];  Service: Orthopedics;   Laterality: Left;   IR KYPHO LUMBAR INC FX REDUCE BONE BX UNI/BIL CANNULATION INC/IMAGING  06/07/2022   IR RADIOLOGIST EVAL & MGMT  06/30/2022   SVT ABLATION  02/22/2017   [facility]    Social History Social History   Tobacco Use   Smoking status: Never   Smokeless tobacco: Never  Vaping Use   Vaping Use: Never used  Substance Use Topics   Alcohol use: No   Drug use: No    Family History Family History  Problem Relation Age of Onset   Kidney cancer Maternal Uncle    Prostate cancer Neg Hx    Bladder Cancer Neg Hx  Allergies  Allergen Reactions   Codeine Nausea And Vomiting and Nausea Only   Tizanidine Other (See Comments)    drowsiness     REVIEW OF SYSTEMS (Negative unless checked)  Constitutional: [] Weight loss  [] Fever  [] Chills Cardiac: [] Chest pain   [] Chest pressure   [] Palpitations   [] Shortness of breath when laying flat   [] Shortness of breath with exertion. Vascular:  [] Pain in legs with walking   [x] Pain in legs at rest  [] History of DVT   [] Phlebitis   [x] Swelling in legs   [] Varicose veins   [] Non-healing ulcers Pulmonary:   [] Uses home oxygen   [] Productive cough   [] Hemoptysis   [] Wheeze  [] COPD   [] Asthma Neurologic:  [] Dizziness   [] Seizures   [] History of stroke   [] History of TIA  [] Aphasia   [] Vissual changes   [] Weakness or numbness in arm   [] Weakness or numbness in leg Musculoskeletal:   [] Joint swelling   [] Joint pain   [] Low back pain Hematologic:  [] Easy bruising  [] Easy bleeding   [] Hypercoagulable state   [] Anemic Gastrointestinal:  [] Diarrhea   [] Vomiting  [] Gastroesophageal reflux/heartburn   [] Difficulty swallowing. Genitourinary:  [] Chronic kidney disease   [] Difficult urination  [] Frequent urination   [] Blood in urine Skin:  [] Rashes   [] Ulcers  Psychological:  [] History of anxiety   []  History of major depression.  Physical Examination  Vitals:   03/20/23 0915  BP: (!) 166/73  Pulse: 69  Resp: 16  Weight: 220 lb (99.8 kg)  Height:  6\' 1"  (1.854 m)   Body mass index is 29.03 kg/m. Gen: WD/WN, NAD Head: [place]/AT, No temporalis wasting.  Ear/Nose/Throat: Hearing grossly intact, nares w/o erythema or drainage, pinna without lesions Eyes: PER, EOMI, sclera nonicteric.  Neck: Supple, no gross masses.  No JVD.  Pulmonary:  Good air movement, no audible wheezing, no use of accessory muscles.  Cardiac: RRR, precordium not hyperdynamic. Vascular:  scattered varicosities present bilaterally.  Moderate venous stasis changes to the legs bilaterally.  3+ soft pitting edema. CEAP [id number]   Vessel Right Left  Radial Palpable Palpable  Gastrointestinal: soft, non-distended. No guarding/no peritoneal signs.  Musculoskeletal: M/S 5/5 throughout.  No deformity.  Neurologic: CN 2-12 intact. Pain and light touch intact in extremities.  Symmetrical.  Speech is fluent. Motor exam as listed above. Psychiatric: Judgment intact, Mood & affect appropriate for pt's clinical situation. Dermatologic: Venous rashes no ulcers noted.  No changes consistent with cellulitis. Lymph : No lichenification or skin changes of chronic lymphedema.  CBC Lab Results  Component Value Date   WBC 6.3 02/21/2022   HGB 9.2 (L) 02/21/2022   HCT 27.1 (L) 02/21/2022   MCV 97.1 02/21/2022   PLT 187 02/21/2022    BMET    Component Value Date/Time   NA 137 02/22/2022 0600   NA 132 (L) 04/12/2014 0012   K 4.0 02/22/2022 0600   K 3.9 04/12/2014 0012   CL 100 02/22/2022 0600   CL 97 (L) 04/12/2014 0012   CO2 30 02/22/2022 0600   CO2 29 04/12/2014 0012   GLUCOSE 103 (H) 02/22/2022 0600   GLUCOSE 92 04/12/2014 0012   BUN 42 (H) 02/22/2022 0600   BUN 23 (H) 04/12/2014 0012   CREATININE 1.42 (H) 02/22/2022 0600   CREATININE 1.68 (H) 04/12/2014 0012   CALCIUM 8.2 (L) 02/22/2022 0600   CALCIUM 8.7 04/12/2014 0012   GFRNONAA 49 (L) 02/22/2022 0600   GFRNONAA 39 (L) 04/12/2014 0012   GFRAA 50 (L)  10/16/2016 0825   GFRAA 46 (L) 04/12/2014 0012   CrCl cannot  be calculated (Patient's most recent lab result is older than the maximum 21 days allowed.).  COAG Lab Results  Component Value Date   INR 1.5 (H) 02/22/2022   INR 1.3 (H) 02/21/2022   INR 1.2 02/20/2022    Radiology US Venous Img Lower Unilateral Right (DVT)  Result Date: 03/16/2023 CLINICAL DATA:  Right lower extremity edema EXAM: RIGHT LOWER EXTREMITY VENOUS DOPPLER ULTRASOUND TECHNIQUE: Gray-scale sonography with compression, as well as color and duplex ultrasound, were performed to evaluate the deep venous system(s) from the level of the common femoral vein through the popliteal and proximal calf veins. COMPARISON:  None Available. FINDINGS: VENOUS Normal compressibility of the common femoral, superficial femoral, and popliteal veins, as well as the visualized calf veins. Visualized portions of profunda femoral vein and great saphenous vein unremarkable. No filling defects to suggest DVT on grayscale or color Doppler imaging. Doppler waveforms show normal direction of venous flow, normal respiratory plasticity and response to augmentation. Limited views of the contralateral common femoral vein are unremarkable. OTHER Extensive superficial subcutaneous edema. Limitations: none IMPRESSION: Negative. Electronically Signed   By: Jacqulynn Cadet M.D.   On: 03/16/2023 14:37     Assessment/Plan 1. Lymphedema Recommend:  I have had a long discussion with the patient regarding swelling and why it  causes symptoms.  Patient will begin wearing graduated compression on a daily basis a prescription was given. The patient will  wear the stockings first thing in the morning and removing them in the evening. The patient is instructed specifically not to sleep in the stockings.   In addition, behavioral modification will be initiated.  This will include frequent elevation, use of over the counter pain medications and exercise such as walking.  Consideration for a lymph pump will also be made based upon  the effectiveness of conservative therapy.  This would help to improve the edema control and prevent sequela such as ulcers and infections   Duplex ultrasound of the right lower extremity venous system from [facility] dated March 16, 2023 is negative for DVT.  Significant soft tissue edema is noted.  The patient will follow-up with me after the ultrasound.   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  Patient will begin wearing graduated compression on a daily basis a prescription was given. The patient will  wear the stockings first thing in the morning and removing them in the evening. The patient is instructed specifically not to sleep in the stockings.   In addition, behavioral modification will be initiated.  This will include frequent elevation, use of over the counter pain medications and exercise such as walking.  Consideration for a lymph pump will also be made based upon the effectiveness of conservative therapy.  This would help to improve the edema control and prevent sequela such as ulcers and infections   Duplex ultrasound of the right lower extremity venous system from [facility] dated March 16, 2023 is negative for DVT.  Significant soft tissue edema is noted.  The patient will follow-up with me after the ultrasound.   3. Benign essential HTN Continue antihypertensive medications as already ordered, these medications have been reviewed and there are no changes at this time.  4. Permanent atrial fibrillation (HCC) Continue antiarrhythmia medications as already ordered, these medications have  been reviewed and there are no changes at this time.  Continue anticoagulation as ordered by Cardiology Service  5. Coronary artery disease due to lipid rich plaque Continue cardiac and antihypertensive medications as already  ordered and reviewed, no changes at this time.  Continue statin as ordered and reviewed, no changes at this time  Nitrates PRN for chest pain   Hortencia Pilar, MD  03/20/2023 9:23 AM

## 2023-04-13 ENCOUNTER — Ambulatory Visit (INDEPENDENT_AMBULATORY_CARE_PROVIDER_SITE_OTHER): Payer: Medicare Other | Admitting: Dermatology

## 2023-04-13 DIAGNOSIS — L570 Actinic keratosis: Secondary | ICD-10-CM | POA: Diagnosis not present

## 2023-04-13 DIAGNOSIS — D489 Neoplasm of uncertain behavior, unspecified: Secondary | ICD-10-CM

## 2023-04-13 MED ORDER — AMINOLEVULINIC ACID HCL 10 % EX GEL
2000.0000 mg | Freq: Once | CUTANEOUS | Status: AC
Start: 2023-04-13 — End: 2023-04-13
  Administered 2023-04-13: 2000 mg via TOPICAL

## 2023-04-13 NOTE — Patient Instructions (Signed)

## 2023-04-13 NOTE — Progress Notes (Signed)
   Follow-Up Visit   Subjective  Robert Rivas is a 82 y.o. male who presents for the following: red light phototherapy.   He also has a thicker bump at his scalp.   Objective  Well appearing patient in no apparent distress; mood and affect are within normal limits.  A focused examination was performed including scalp. Relevant physical exam findings are noted in the Assessment and Plan.  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 Will observe and recheck in 1 month. Call for changes.  Related Medications Aminolevulinic Acid HCl 10 % GEL 2,000 mg    Patient completed red light phototherapy today.  ACTINIC KERATOSES Exam: Erythematous thin papules/macules with gritty scale.  Treatment Plan:  Red Light Photodynamic therapy  Procedure discussed: discussed risks, benefits, side effects. and alternatives   Prep: site scrubbed/prepped with acetone   Debridement needed: Yes (performed by Physician with sand paper.  (CPT C5184948)) Location:  scalp Number of lesions:  Multiple (> 15) Type of treatment:  Red light Aminolevulinic Acid (see MAR for details): Ameluz Aminolevulinic Acid comment:  [id number] Amount of Ameluz (mg):  1 Incubation time (minutes):  120 Number of minutes under lamp:  10 Cooling:  Fan Outcome: patient tolerated procedure well with no complications   Post-procedure details: sunscreen applied and aftercare instructions given to patient    Related Medications Aminolevulinic Acid HCl 10 % GEL 2,000 mg  Robert Rivas  I personally debrided area prior to application of aminolevulinic acid  Documentation: I have reviewed the above documentation for accuracy and completeness, and I agree with the above.  Darden Dates, MD

## 2023-05-16 ENCOUNTER — Ambulatory Visit: Payer: Medicare PPO | Admitting: Dermatology

## 2023-05-16 ENCOUNTER — Encounter: Payer: Self-pay | Admitting: Dermatology

## 2023-05-16 DIAGNOSIS — L578 Other skin changes due to chronic exposure to nonionizing radiation: Secondary | ICD-10-CM | POA: Diagnosis not present

## 2023-05-16 DIAGNOSIS — D492 Neoplasm of unspecified behavior of bone, soft tissue, and skin: Secondary | ICD-10-CM

## 2023-05-16 DIAGNOSIS — W908XXA Exposure to other nonionizing radiation, initial encounter: Secondary | ICD-10-CM

## 2023-05-16 DIAGNOSIS — X32XXXA Exposure to sunlight, initial encounter: Secondary | ICD-10-CM

## 2023-05-16 DIAGNOSIS — L570 Actinic keratosis: Secondary | ICD-10-CM

## 2023-05-16 DIAGNOSIS — Z5111 Encounter for antineoplastic chemotherapy: Secondary | ICD-10-CM

## 2023-05-16 MED ORDER — FLUOROURACIL 5 % EX CREA: TOPICAL_CREAM | Freq: Two times a day (BID) | CUTANEOUS | 0 refills | Status: AC

## 2023-05-16 MED ORDER — CALCIPOTRIENE 0.005 % EX CREA: TOPICAL_CREAM | Freq: Two times a day (BID) | CUTANEOUS | 0 refills | Status: AC

## 2023-05-16 NOTE — Patient Instructions (Addendum)
- Start 5-fluorouracil/calcipotriene cream twice a day for 4 days to affected areas including face, twice daily for 7 days to ears. Once face and ears are healed, treat scalp twice daily for 7 days. Patient provided with handout reviewing treatment course and side effects and advised to call or message [place] on MyChart with any concerns.  Reviewed course of treatment and expected reaction.  Patient advised to expect inflammation and crusting and advised that erosions are possible.  Patient advised to be diligent with sun protection during and after treatment. Counseled to keep medication out of reach of children and pets.  5-Fluorouracil/Calcipotriene Patient Education   Actinic keratoses are the dry, red scaly spots on the skin caused by sun damage. A portion of these spots can turn into skin cancer with time, and treating them can help prevent development of skin cancer.   Treatment of these spots requires removal of the defective skin cells. There are various ways to remove actinic keratoses, including freezing with liquid nitrogen, treatment with creams, or treatment with a blue light procedure in the office.   5-fluorouracil cream is a topical cream used to treat actinic keratoses. It works by interfering with the growth of abnormal fast-growing skin cells, such as actinic keratoses. These cells peel off and are replaced by healthy ones. THIS CREAM SHOULD BE KEPT OUT OF REACH OF CHILDREN AND PETS AND SHOULD NOT BE USED BY PREGNANT WOMEN.  5-fluorouracil/calcipotriene is a combination of the 5-fluorouracil cream with a vitamin D analog cream called calcipotriene. The calcipotriene alone does not treat actinic keratoses. However, when it is combined with 5-fluorouracil, it helps the 5-fluorouracil treat the actinic keratoses much faster so that the same results can be achieved with a much shorter treatment time.  INSTRUCTIONS FOR 5-FLUOROURACIL/CALCIPOTRIENE CREAM:   5-fluorouracil/calcipotriene  cream typically only needs to be used for 4-7 days. A thin layer should be applied twice a day to the treatment areas recommended by your physician.   If your physician prescribed you separate tubes of 5-fluourouracil and calcipotriene, apply a thin layer of 5-fluorouracil followed by a thin layer of calcipotriene.   Avoid contact with your eyes or nostrils. Avoid applying the cream to your eyelids or lips unless directed to apply there by your physician. Do not use 5-fluorouracil/calcipotriene cream on infected or open wounds.   You will develop redness, irritation and some crusting at areas where you have pre-cancer damage/actinic keratoses. IF YOU DEVELOP PAIN, BLEEDING, OR SIGNIFICANT CRUSTING, STOP THE TREATMENT EARLY - you have already gotten a good response and the actinic keratoses should clear up well.  Wash your hands after applying 5-fluorouracil 5% cream on your skin.   A moisturizer or sunscreen with a minimum SPF 30 should be applied each morning.   Once you have finished the treatment, you can apply a thin layer of Vaseline twice a day to irritated areas to soothe and calm the areas more quickly. If you experience significant discomfort, contact your physician.  For some patients it is necessary to repeat the treatment for best results.  SIDE EFFECTS: When using 5-fluorouracil/calcipotriene cream, you may have mild irritation, such as redness, dryness, swelling, or a mild burning sensation. This usually resolves within 2 weeks. The more actinic keratoses you have, the more redness and inflammation you can expect during treatment. Eye irritation has been reported rarely. If this occurs, please let us know.   If you have any trouble using this cream, please send [place] a MyChart message or call the  office. If you have any other questions about this information, please do not hesitate to ask me before you leave the office or contact me on MyChart or by phone.  Due to recent changes in  healthcare laws, you may see results of your pathology and/or laboratory studies on MyChart before the doctors have had a chance to review them. We understand that in some cases there may be results that are confusing or concerning to you. Please understand that not all results are received at the same time and often the doctors may need to interpret multiple results in order to provide you with the best plan of care or course of treatment. Therefore, we ask that you please give [place] 2 business days to thoroughly review all your results before contacting the office for clarification. Should we see a critical lab result, you will be contacted sooner.   If You Need Anything After Your Visit  If you have any questions or concerns for your doctor, please call our main line at [phone] and press option 4 to reach your doctor's medical assistant. If no one answers, please leave a voicemail as directed and we will return your call as soon as possible. Messages left after 4 pm will be answered the following business day.   You may also send [place] a message via MyChart. We typically respond to MyChart messages within 1-2 business days.  For prescription refills, please ask your pharmacy to contact our office. Our fax number is [phone].  If you have an urgent issue when the clinic is closed that cannot wait until the next business day, you can page your doctor at the number below.    Please note that while we do our best to be available for urgent issues outside of office hours, we are not available 24/7.   If you have an urgent issue and are unable to reach [place], you may choose to seek medical care at your doctor's office, retail clinic, urgent care center, or emergency room.  If you have a medical emergency, please immediately call 911 or go to the emergency department.  Pager Numbers  - Dr. Gwen Pounds: [phone]  - Dr. Neale Burly: [phone]  - Dr. Roseanne Reno: [phone]  In the event of inclement  weather, please call our main line at [phone] for an update on the status of any delays or closures.  Dermatology Medication Tips: Please keep the boxes that topical medications come in in order to help keep track of the instructions about where and how to use these. Pharmacies typically print the medication instructions only on the boxes and not directly on the medication tubes.   If your medication is too expensive, please contact our office at [phone] option 4 or send [place] a message through MyChart.   We are unable to tell what your co-pay for medications will be in advance as this is different depending on your insurance coverage. However, we may be able to find a substitute medication at lower cost or fill out paperwork to get insurance to cover a needed medication.   If a prior authorization is required to get your medication covered by your insurance company, please allow [place] 1-2 business days to complete this process.  Drug prices often vary depending on where the prescription is filled and some pharmacies may offer cheaper prices.  The website www.goodrx.com contains coupons for medications through different pharmacies. The prices here do not account for what the cost may be with help from insurance (it may  be cheaper with your insurance), but the website can give you the price if you did not use any insurance.  - You can print the associated coupon and take it with your prescription to the pharmacy.  - You may also stop by our office during regular business hours and pick up a GoodRx coupon card.  - If you need your prescription sent electronically to a different pharmacy, notify our office through [facility] or by phone at [phone] option 4.

## 2023-05-16 NOTE — Progress Notes (Signed)
Follow-Up Visit   Subjective  Robert Rivas is a 82 y.o. male who presents for the following: recheck right vertex scalp and PDT with red light to scalp follow up.    The following portions of the chart were reviewed this encounter and updated as appropriate: medications, allergies, medical history  Review of Systems:  No other skin or systemic complaints except as noted in HPI or Assessment and Plan.  Objective  Well appearing patient in no apparent distress; mood and affect are within normal limits.   A focused examination was performed of the following areas: scalp  Relevant exam findings are noted in the Assessment and Plan.  Right Vertex Scalp 0.6 cm firm skin colored papule without features suspicious for malignancy on dermoscopy   Right Parietal Scalp 1.5 cm soft plaque without features suspicious for malignancy on dermoscopy     Assessment & Plan     Neoplasm of skin (2) Right Vertex Scalp  Right Parietal Scalp  Favor scar at right vertex scalp, monitor for changes  Favor nevus at right parietal scalp, monitor for changes  ACTINIC DAMAGE WITH PRECANCEROUS ACTINIC KERATOSES Counseling for Topical Chemotherapy Management: Patient exhibits: - Severe, confluent actinic changes with pre-cancerous actinic keratoses that is secondary to cumulative UV radiation exposure over time - Condition that is severe; chronic, not at goal. - diffuse scaly erythematous macules and papules with underlying dyspigmentation - Discussed Prescription "Field Treatment" topical Chemotherapy for Severe, Chronic Confluent Actinic Changes with Pre-Cancerous Actinic Keratoses Field treatment involves treatment of an entire area of skin that has confluent Actinic Changes (Sun/ Ultraviolet light damage) and PreCancerous Actinic Keratoses by method of PhotoDynamic Therapy (PDT) and/or prescription Topical Chemotherapy agents such as 5-fluorouracil, 5-fluorouracil/calcipotriene, and/or  imiquimod.  The purpose is to decrease the number of clinically evident and subclinical PreCancerous lesions to prevent progression to development of skin cancer by chemically destroying early precancer changes that may or may not be visible.  It has been shown to reduce the risk of developing skin cancer in the treated area. As a result of treatment, redness, scaling, crusting, and open sores may occur during treatment course. One or more than one of these methods may be used and may have to be used several times to control, suppress and eliminate the PreCancerous changes. Discussed treatment course, expected reaction, and possible side effects. - Recommend daily broad spectrum sunscreen SPF 30+ to sun-exposed areas, reapply every 2 hours as needed.  - Staying in the shade or wearing long sleeves, sun glasses (UVA+UVB protection) and wide brim hats (4-inch brim around the entire circumference of the hat) are also recommended. - Call for new or changing lesions.  - Start 5-fluorouracil/calcipotriene cream twice a day for 4 days to affected areas including face, twice daily for 7 days to ears. Once face and ears are healed, treat scalp twice daily for 7 days. Patient provided with handout reviewing treatment course and side effects and advised to call or message [place] on MyChart with any concerns.  Reviewed course of treatment and expected reaction.  Patient advised to expect inflammation and crusting and advised that erosions are possible.  Patient advised to be diligent with sun protection during and after treatment. Counseled to keep medication out of reach of children and pets.   Return for as scheduled.  Anise Salvo, RMA, am acting as scribe for Darden Dates, MD .   Documentation: I have reviewed the above documentation for accuracy and completeness, and I agree with the above.  Darden Dates, MD

## 2023-05-18 ENCOUNTER — Ambulatory Visit (INDEPENDENT_AMBULATORY_CARE_PROVIDER_SITE_OTHER): Payer: TRICARE For Life (TFL) | Admitting: Vascular Surgery

## 2023-05-18 NOTE — Progress Notes (Deleted)
MRN : [id number]  Robert Rivas is a 82 y.o. (11/17/1941) male who presents with chief complaint of legs swell.  History of Present Illness:   The patient returns to the office for followup evaluation regarding leg swelling.  The swelling has persisted and the pain associated with swelling continues. There have not been any interval development of a ulcerations or wounds.  Since the previous visit the patient has been wearing graduated compression stockings and has noted little if any improvement in the lymphedema. The patient has been using compression routinely morning until night.  The patient also states elevation during the day and exercise is being done too.  Duplex ultrasound of the right lower extremity venous system from [facility] dated March 16, 2023 is negative for DVT. Significant soft tissue edema is noted.   No outpatient medications have been marked as taking for the 05/18/23 encounter (Appointment) with [name], [name], MD.    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   L upper back 5.0cm inf to base of neck, 5.0cm lat to spine   Hyperlipidemia    Hypertension    MI, old    Nocturia    Phlebitis    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   SCC IS R foreearm distal   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   SCC IS R forearm proximal   Squamous cell carcinoma of skin 03/26/2018   SCC IS R forearm   Squamous cell carcinoma of skin 01/01/2008   SCC IS R forearm   Wears dentures    partial   Wears  hearing aid in both ears     Past Surgical History:  Procedure Laterality Date   cardaic stents     CARDIAC CATHETERIZATION     PTCA with Stent Placement   CARDIOVERSION N/A 04/13/2021   Procedure: CARDIOVERSION;  Surgeon: Lamar Blinks, MD;  Location: [facility] ORS;  Service: Cardiovascular;  Laterality: N/A;   CARDIOVERSION N/A 05/05/2021   Procedure: CARDIOVERSION;  Surgeon: Lamar Blinks, MD;  Location: [facility] ORS;  Service: Cardiovascular;  Laterality: N/A;   CATARACT EXTRACTION W/PHACO Left 01/12/2023   Procedure: CATARACT EXTRACTION PHACO AND INTRAOCULAR LENS PLACEMENT (IOC) LEFT COMPLICATED MALYUGIN OMIDRIA   12.02  01:30.8;  Surgeon: Estanislado Pandy, MD;  Location: [facility];  Service: Ophthalmology;  Laterality: Left;   CATARACT EXTRACTION W/PHACO Right 01/26/2023   Procedure: CATARACT EXTRACTION PHACO AND INTRAOCULAR LENS PLACEMENT (IOC) RIGHT OMIDRIA  COMPLICATED  5.84  00:52.1;  Surgeon: Estanislado Pandy, MD;  Location: [facility];  Service: Ophthalmology;  Laterality: Right;   COLONOSCOPY WITH PROPOFOL N/A 12/15/2015   Procedure: COLONOSCOPY WITH PROPOFOL;  Surgeon: Christena Deem, MD;  Location: [facility] ENDOSCOPY;  Service: Endoscopy;  Laterality: N/A;  CORONARY ARTERY BYPASS GRAFT  1992   ECTROPION REPAIR Right 12/12/2017   Procedure: REPAIR OF ECTROPION SUTURES/EXTENSIVE RIGHT;  Surgeon: Imagene Riches, MD;  Location: [facility];  Service: Ophthalmology;  Laterality: Right;   HERNIA REPAIR     HIP ARTHROPLASTY Left 02/19/2022   Procedure: ARTHROPLASTY BIPOLAR HIP (HEMIARTHROPLASTY);  Surgeon: Juanell Fairly, MD;  Location: [facility] ORS;  Service: Orthopedics;  Laterality: Left;   IR KYPHO LUMBAR INC FX REDUCE BONE BX UNI/BIL CANNULATION INC/IMAGING  06/07/2022   IR RADIOLOGIST EVAL & MGMT  06/30/2022   SVT ABLATION  02/22/2017   [facility]    Social History Social History   Tobacco Use   Smoking status: Never   Smokeless tobacco: Never   Vaping Use   Vaping Use: Never used  Substance Use Topics   Alcohol use: No   Drug use: No    Family History Family History  Problem Relation Age of Onset   Kidney cancer Maternal Uncle    Prostate cancer Neg Hx    Bladder Cancer Neg Hx     Allergies  Allergen Reactions   Codeine Nausea And Vomiting and Nausea Only   Tizanidine Other (See Comments)    drowsiness     REVIEW OF SYSTEMS (Negative unless checked)  Constitutional: [] Weight loss  [] Fever  [] Chills Cardiac: [] Chest pain   [] Chest pressure   [] Palpitations   [] Shortness of breath when laying flat   [] Shortness of breath with exertion. Vascular:  [] Pain in legs with walking   [x] Pain in legs with standing  [] History of DVT   [] Phlebitis   [x] Swelling in legs   [] Varicose veins   [] Non-healing ulcers Pulmonary:   [] Uses home oxygen   [] Productive cough   [] Hemoptysis   [] Wheeze  [] COPD   [] Asthma Neurologic:  [] Dizziness   [] Seizures   [] History of stroke   [] History of TIA  [] Aphasia   [] Vissual changes   [] Weakness or numbness in arm   [] Weakness or numbness in leg Musculoskeletal:   [] Joint swelling   [] Joint pain   [] Low back pain Hematologic:  [] Easy bruising  [] Easy bleeding   [] Hypercoagulable state   [] Anemic Gastrointestinal:  [] Diarrhea   [] Vomiting  [] Gastroesophageal reflux/heartburn   [] Difficulty swallowing. Genitourinary:  [] Chronic kidney disease   [] Difficult urination  [] Frequent urination   [] Blood in urine Skin:  [] Rashes   [] Ulcers  Psychological:  [] History of anxiety   []  History of major depression.  Physical Examination  There were no vitals filed for this visit. There is no height or weight on file to calculate BMI. Gen: WD/WN, NAD Head: [place]/AT, No temporalis wasting.  Ear/Nose/Throat: Hearing grossly intact, nares w/o erythema or drainage, pinna without lesions Eyes: PER, EOMI, sclera nonicteric.  Neck: Supple, no gross masses.  No JVD.  Pulmonary:  Good air movement, no audible  wheezing, no use of accessory muscles.  Cardiac: RRR, precordium not hyperdynamic. Vascular:  scattered varicosities present bilaterally.  Mild venous stasis changes to the legs bilaterally.  3-4+ soft pitting edema, CEAP C4sEpAsPr  Vessel Right Left  Radial Palpable Palpable  Gastrointestinal: soft, non-distended. No guarding/no peritoneal signs.  Musculoskeletal: M/S 5/5 throughout.  No deformity.  Neurologic: CN 2-12 intact. Pain and light touch intact in extremities.  Symmetrical.  Speech is fluent. Motor exam as listed above. Psychiatric: Judgment intact, Mood & affect appropriate for pt's clinical situation. Dermatologic: Venous rashes no ulcers noted.  No changes consistent with cellulitis. Lymph : No lichenification or skin changes of chronic  lymphedema.  CBC Lab Results  Component Value Date   WBC 6.3 02/21/2022   HGB 9.2 (L) 02/21/2022   HCT 27.1 (L) 02/21/2022   MCV 97.1 02/21/2022   PLT 187 02/21/2022    BMET    Component Value Date/Time   NA 137 02/22/2022 0600   NA 132 (L) 04/12/2014 0012   K 4.0 02/22/2022 0600   K 3.9 04/12/2014 0012   CL 100 02/22/2022 0600   CL 97 (L) 04/12/2014 0012   CO2 30 02/22/2022 0600   CO2 29 04/12/2014 0012   GLUCOSE 103 (H) 02/22/2022 0600   GLUCOSE 92 04/12/2014 0012   BUN 42 (H) 02/22/2022 0600   BUN 23 (H) 04/12/2014 0012   CREATININE 1.42 (H) 02/22/2022 0600   CREATININE 1.68 (H) 04/12/2014 0012   CALCIUM 8.2 (L) 02/22/2022 0600   CALCIUM 8.7 04/12/2014 0012   GFRNONAA 49 (L) 02/22/2022 0600   GFRNONAA 39 (L) 04/12/2014 0012   GFRAA 50 (L) 10/16/2016 0825   GFRAA 46 (L) 04/12/2014 0012   CrCl cannot be calculated (Patient's most recent lab result is older than the maximum 21 days allowed.).  COAG Lab Results  Component Value Date   INR 1.5 (H) 02/22/2022   INR 1.3 (H) 02/21/2022   INR 1.2 02/20/2022    Radiology No results found.   Assessment/Plan There are no diagnoses linked to this encounter.   Levora Dredge, MD  05/18/2023 8:43 AM

## 2023-05-31 NOTE — Progress Notes (Deleted)
MRN : [id number]  Robert Rivas is a 82 y.o. (12/06/1941) male who presents with chief complaint of legs swell.  History of Present Illness: ***  No outpatient medications have been marked as taking for the 06/01/23 encounter (Appointment) with [name], [name], MD.    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   L upper back 5.0cm inf to base of neck, 5.0cm lat to spine   Hyperlipidemia    Hypertension    MI, old    Nocturia    Phlebitis    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   SCC IS R foreearm distal   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   SCC IS R forearm proximal   Squamous cell carcinoma of skin 03/26/2018   SCC IS R forearm   Squamous cell carcinoma of skin 01/01/2008   SCC IS R forearm   Wears dentures    partial   Wears hearing aid in both ears     Past Surgical History:  Procedure Laterality Date   cardaic stents     CARDIAC CATHETERIZATION     PTCA with Stent Placement   CARDIOVERSION N/A 04/13/2021   Procedure: CARDIOVERSION;  Surgeon: Lamar Blinks, MD;  Location: [facility] ORS;  Service: Cardiovascular;  Laterality: N/A;   CARDIOVERSION N/A 05/05/2021   Procedure: CARDIOVERSION;  Surgeon: Lamar Blinks, MD;  Location: [facility] ORS;  Service: Cardiovascular;  Laterality: N/A;   CATARACT EXTRACTION W/PHACO Left 01/12/2023   Procedure: CATARACT EXTRACTION PHACO AND INTRAOCULAR LENS PLACEMENT (IOC) LEFT COMPLICATED MALYUGIN OMIDRIA   12.02  01:30.8;  Surgeon: Estanislado Pandy, MD;  Location: [facility];  Service: Ophthalmology;  Laterality: Left;   CATARACT EXTRACTION W/PHACO Right 01/26/2023   Procedure: CATARACT EXTRACTION PHACO AND INTRAOCULAR LENS PLACEMENT (IOC) RIGHT OMIDRIA  COMPLICATED  5.84  00:52.1;  Surgeon: Estanislado Pandy, MD;  Location: [facility];  Service: Ophthalmology;  Laterality: Right;   COLONOSCOPY WITH PROPOFOL N/A 12/15/2015   Procedure: COLONOSCOPY WITH PROPOFOL;  Surgeon: Christena Deem, MD;  Location: [facility] ENDOSCOPY;  Service: Endoscopy;  Laterality: N/A;   CORONARY ARTERY BYPASS GRAFT  1992   ECTROPION REPAIR Right 12/12/2017   Procedure: REPAIR OF ECTROPION SUTURES/EXTENSIVE RIGHT;  Surgeon: Imagene Riches, MD;  Location: [facility];  Service: Ophthalmology;  Laterality: Right;   HERNIA REPAIR     HIP ARTHROPLASTY Left 02/19/2022   Procedure: ARTHROPLASTY BIPOLAR HIP (HEMIARTHROPLASTY);  Surgeon: Juanell Fairly, MD;  Location: [facility] ORS;  Service: Orthopedics;  Laterality: Left;   IR KYPHO LUMBAR INC FX REDUCE BONE BX UNI/BIL CANNULATION INC/IMAGING  06/07/2022   IR RADIOLOGIST EVAL & MGMT  06/30/2022   SVT ABLATION  02/22/2017   [facility]    Social History Social History   Tobacco Use  Smoking status: Never   Smokeless tobacco: Never  Vaping Use   Vaping Use: Never used  Substance Use Topics   Alcohol use: No   Drug use: No    Family History Family History  Problem Relation Age of Onset   Kidney cancer Maternal Uncle    Prostate cancer Neg Hx    Bladder Cancer Neg Hx     Allergies  Allergen Reactions   Codeine Nausea And Vomiting and Nausea Only   Tizanidine Other (See Comments)    drowsiness     REVIEW OF SYSTEMS (Negative unless checked)  Constitutional: [] Weight loss  [] Fever  [] Chills Cardiac: [] Chest pain   [] Chest pressure   [] Palpitations   [] Shortness of breath when laying flat   [] Shortness of breath with exertion. Vascular:  [] Pain in legs with walking   [x] Pain in legs with standing  [] History of DVT    [] Phlebitis   [x] Swelling in legs   [] Varicose veins   [] Non-healing ulcers Pulmonary:   [] Uses home oxygen   [] Productive cough   [] Hemoptysis   [] Wheeze  [] COPD   [] Asthma Neurologic:  [] Dizziness   [] Seizures   [] History of stroke   [] History of TIA  [] Aphasia   [] Vissual changes   [] Weakness or numbness in arm   [] Weakness or numbness in leg Musculoskeletal:   [] Joint swelling   [] Joint pain   [] Low back pain Hematologic:  [] Easy bruising  [] Easy bleeding   [] Hypercoagulable state   [] Anemic Gastrointestinal:  [] Diarrhea   [] Vomiting  [] Gastroesophageal reflux/heartburn   [] Difficulty swallowing. Genitourinary:  [] Chronic kidney disease   [] Difficult urination  [] Frequent urination   [] Blood in urine Skin:  [] Rashes   [] Ulcers  Psychological:  [] History of anxiety   []  History of major depression.  Physical Examination  There were no vitals filed for this visit. There is no height or weight on file to calculate BMI. Gen: WD/WN, NAD Head: [place]/AT, No temporalis wasting.  Ear/Nose/Throat: Hearing grossly intact, nares w/o erythema or drainage, pinna without lesions Eyes: PER, EOMI, sclera nonicteric.  Neck: Supple, no gross masses.  No JVD.  Pulmonary:  Good air movement, no audible wheezing, no use of accessory muscles.  Cardiac: RRR, precordium not hyperdynamic. Vascular:  scattered varicosities present bilaterally.  Mild venous stasis changes to the legs bilaterally.  3-4+ soft pitting edema, CEAP C4sEpAsPr  Vessel Right Left  Radial Palpable Palpable  Gastrointestinal: soft, non-distended. No guarding/no peritoneal signs.  Musculoskeletal: M/S 5/5 throughout.  No deformity.  Neurologic: CN 2-12 intact. Pain and light touch intact in extremities.  Symmetrical.  Speech is fluent. Motor exam as listed above. Psychiatric: Judgment intact, Mood & affect appropriate for pt's clinical situation. Dermatologic: Venous rashes no ulcers noted.  No changes consistent with cellulitis. Lymph : No  lichenification or skin changes of chronic lymphedema.  CBC Lab Results  Component Value Date   WBC 6.3 02/21/2022   HGB 9.2 (L) 02/21/2022   HCT 27.1 (L) 02/21/2022   MCV 97.1 02/21/2022   PLT 187 02/21/2022    BMET    Component Value Date/Time   NA 137 02/22/2022 0600   NA 132 (L) 04/12/2014 0012   K 4.0 02/22/2022 0600   K 3.9 04/12/2014 0012   CL 100 02/22/2022 0600   CL 97 (L) 04/12/2014 0012   CO2 30 02/22/2022 0600   CO2 29 04/12/2014 0012   GLUCOSE 103 (H) 02/22/2022 0600   GLUCOSE 92 04/12/2014 0012   BUN 42 (H) 02/22/2022 0600  BUN 23 (H) 04/12/2014 0012   CREATININE 1.42 (H) 02/22/2022 0600   CREATININE 1.68 (H) 04/12/2014 0012   CALCIUM 8.2 (L) 02/22/2022 0600   CALCIUM 8.7 04/12/2014 0012   GFRNONAA 49 (L) 02/22/2022 0600   GFRNONAA 39 (L) 04/12/2014 0012   GFRAA 50 (L) 10/16/2016 0825   GFRAA 46 (L) 04/12/2014 0012   CrCl cannot be calculated (Patient's most recent lab result is older than the maximum 21 days allowed.).  COAG Lab Results  Component Value Date   INR 1.5 (H) 02/22/2022   INR 1.3 (H) 02/21/2022   INR 1.2 02/20/2022    Radiology No results found.   Assessment/Plan There are no diagnoses linked to this encounter.   Levora Dredge, MD  05/31/2023 11:41 AM

## 2023-06-01 ENCOUNTER — Ambulatory Visit (INDEPENDENT_AMBULATORY_CARE_PROVIDER_SITE_OTHER): Payer: Medicare PPO | Admitting: Nurse Practitioner

## 2023-06-01 VITALS — BP 177/78 | HR 62 | Resp 19 | Ht 69.75 in | Wt 215.8 lb

## 2023-06-01 DIAGNOSIS — I2510 Atherosclerotic heart disease of native coronary artery without angina pectoris: Secondary | ICD-10-CM | POA: Diagnosis not present

## 2023-06-01 DIAGNOSIS — I872 Venous insufficiency (chronic) (peripheral): Secondary | ICD-10-CM | POA: Diagnosis not present

## 2023-06-01 DIAGNOSIS — I890 Lymphedema, not elsewhere classified: Secondary | ICD-10-CM

## 2023-06-01 DIAGNOSIS — I2583 Coronary atherosclerosis due to lipid rich plaque: Secondary | ICD-10-CM

## 2023-06-01 DIAGNOSIS — I10 Essential (primary) hypertension: Secondary | ICD-10-CM

## 2023-06-01 DIAGNOSIS — I4891 Unspecified atrial fibrillation: Secondary | ICD-10-CM

## 2023-06-02 ENCOUNTER — Encounter (INDEPENDENT_AMBULATORY_CARE_PROVIDER_SITE_OTHER): Payer: Self-pay | Admitting: Nurse Practitioner

## 2023-06-02 NOTE — Progress Notes (Unsigned)
MRN : [id number]  Robert Rivas is a 82 y.o. ([birth year]) male who presents with chief complaint of legs swell.  History of Present Illness: The patient returns to the office for followup evaluation regarding leg swelling.  The swelling has improved quite a bit and the pain associated with swelling has decreased substantially. There have not been any interval development of a ulcerations or wounds.  Since the previous visit the patient has been wearing graduated compression stockings and has noted some improvement in the lymphedema. The patient has been using compression routinely morning until night.  The patient also states elevation during the day and exercise (such as walking) is being done too.    No outpatient medications have been marked as taking for the 06/01/23 encounter (Appointment) with [name], [name], MD.    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   L upper back 5.0cm inf to base of neck, 5.0cm lat to spine   Hyperlipidemia    Hypertension    MI, old    Nocturia    Phlebitis    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   SCC IS R foreearm distal   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   SCC IS R forearm proximal   Squamous cell carcinoma of skin 03/26/2018   SCC IS R forearm   Squamous cell carcinoma of skin 01/01/2008   SCC IS R forearm   Wears dentures    partial   Wears hearing aid in both ears     Past Surgical History:  Procedure Laterality Date   cardaic stents     CARDIAC CATHETERIZATION     PTCA with  Stent Placement   CARDIOVERSION N/A 04/13/2021   Procedure: CARDIOVERSION;  Surgeon: Lamar Blinks, MD;  Location: [facility] ORS;  Service: Cardiovascular;  Laterality: N/A;   CARDIOVERSION N/A 05/05/2021   Procedure: CARDIOVERSION;  Surgeon: Lamar Blinks, MD;  Location: [facility] ORS;  Service: Cardiovascular;  Laterality: N/A;   CATARACT EXTRACTION W/PHACO Left 01/12/2023   Procedure: CATARACT EXTRACTION PHACO AND INTRAOCULAR LENS PLACEMENT (IOC) LEFT COMPLICATED MALYUGIN OMIDRIA   12.02  01:30.8;  Surgeon: Estanislado Pandy, MD;  Location: [facility];  Service: Ophthalmology;  Laterality: Left;   CATARACT EXTRACTION W/PHACO Right 01/26/2023   Procedure: CATARACT EXTRACTION PHACO AND INTRAOCULAR LENS PLACEMENT (IOC) RIGHT OMIDRIA  COMPLICATED  5.84  00:52.1;  Surgeon: Estanislado Pandy, MD;  Location: [facility];  Service: Ophthalmology;  Laterality: Right;   COLONOSCOPY WITH PROPOFOL N/A 12/15/2015   Procedure: COLONOSCOPY WITH PROPOFOL;  Surgeon: Christena Deem, MD;  Location: [facility] END[facility];  Service: Endoscopy;  Laterality: N/A;   CORONARY ARTERY BYPASS GRAFT  1992   ECTROPION REPAIR Right 12/12/2017   Procedure: REPAIR OF ECTROPION SUTURES/EXTENSIVE RIGHT;  Surgeon: Ether Griffins, Amy  M, MD;  Location: [facility];  Service: Ophthalmology;  Laterality: Right;   HERNIA REPAIR     HIP ARTHROPLASTY Left 02/19/2022   Procedure: ARTHROPLASTY BIPOLAR HIP (HEMIARTHROPLASTY);  Surgeon: Juanell Fairly, MD;  Location: [facility] ORS;  Service: Orthopedics;  Laterality: Left;   IR KYPHO LUMBAR INC FX REDUCE BONE BX UNI/BIL CANNULATION INC/IMAGING  06/07/2022   IR RADIOLOGIST EVAL & MGMT  06/30/2022   SVT ABLATION  02/22/2017   [facility]    Social History Social History   Tobacco Use   Smoking status: Never   Smokeless tobacco: Never  Vaping Use   Vaping Use: Never used  Substance Use Topics   Alcohol use: No   Drug use: No    Family History Family History  Problem  Relation Age of Onset   Kidney cancer Maternal Uncle    Prostate cancer Neg Hx    Bladder Cancer Neg Hx     Allergies  Allergen Reactions   Codeine Nausea And Vomiting and Nausea Only   Tizanidine Other (See Comments)    drowsiness     REVIEW OF SYSTEMS (Negative unless checked)  Constitutional: [] Weight loss  [] Fever  [] Chills Cardiac: [] Chest pain   [] Chest pressure   [] Palpitations   [] Shortness of breath when laying flat   [] Shortness of breath with exertion. Vascular:  [] Pain in legs with walking   [x] Pain in legs with standing  [] History of DVT   [] Phlebitis   [x] Swelling in legs   [] Varicose veins   [] Non-healing ulcers Pulmonary:   [] Uses home oxygen   [] Productive cough   [] Hemoptysis   [] Wheeze  [] COPD   [] Asthma Neurologic:  [] Dizziness   [] Seizures   [] History of stroke   [] History of TIA  [] Aphasia   [] Vissual changes   [] Weakness or numbness in arm   [] Weakness or numbness in leg Musculoskeletal:   [] Joint swelling   [] Joint pain   [] Low back pain Hematologic:  [] Easy bruising  [] Easy bleeding   [] Hypercoagulable state   [] Anemic Gastrointestinal:  [] Diarrhea   [] Vomiting  [] Gastroesophageal reflux/heartburn   [] Difficulty swallowing. Genitourinary:  [] Chronic kidney disease   [] Difficult urination  [] Frequent urination   [] Blood in urine Skin:  [] Rashes   [] Ulcers  Psychological:  [] History of anxiety   []  History of major depression.  Physical Examination  There were no vitals filed for this visit. There is no height or weight on file to calculate BMI. Gen: WD/WN, NAD Head: [place]/AT, No temporalis wasting.  Ear/Nose/Throat: Hearing grossly intact, nares w/o erythema or drainage, pinna without lesions Eyes: PER, EOMI, sclera nonicteric.  Neck: Supple, no gross masses.  No JVD.  Pulmonary:  Good air movement, no audible wheezing, no use of accessory muscles.  Cardiac: RRR, precordium not hyperdynamic. Vascular:  scattered varicosities present bilaterally.  Mild  venous stasis changes to the legs bilaterally.  3-4+ soft pitting edema, CEAP [id number]  Vessel Right Left  Radial Palpable Palpable  Gastrointestinal: soft, non-distended. No guarding/no peritoneal signs.  Musculoskeletal: M/S 5/5 throughout.  No deformity.  Neurologic: CN 2-12 intact. Pain and light touch intact in extremities.  Symmetrical.  Speech is fluent. Motor exam as listed above. Psychiatric: Judgment intact, Mood & affect appropriate for pt's clinical situation. Dermatologic: Venous rashes no ulcers noted.  No changes consistent with cellulitis. Lymph : No lichenification or skin changes of chronic lymphedema.  CBC Lab Results  Component Value Date   WBC 6.3 02/21/2022   HGB 9.2 (L) 02/21/2022   HCT 27.1 (  L) 02/21/2022   MCV 97.1 02/21/2022   PLT 187 02/21/2022    BMET    Component Value Date/Time   NA 137 02/22/2022 0600   NA 132 (L) 04/12/2014 0012   K 4.0 02/22/2022 0600   K 3.9 04/12/2014 0012   CL 100 02/22/2022 0600   CL 97 (L) 04/12/2014 0012   CO2 30 02/22/2022 0600   CO2 29 04/12/2014 0012   GLUCOSE 103 (H) 02/22/2022 0600   GLUCOSE 92 04/12/2014 0012   BUN 42 (H) 02/22/2022 0600   BUN 23 (H) 04/12/2014 0012   CREATININE 1.42 (H) 02/22/2022 0600   CREATININE 1.68 (H) 04/12/2014 0012   CALCIUM 8.2 (L) 02/22/2022 0600   CALCIUM 8.7 04/12/2014 0012   GFRNONAA 49 (L) 02/22/2022 0600   GFRNONAA 39 (L) 04/12/2014 0012   GFRAA 50 (L) 10/16/2016 0825   GFRAA 46 (L) 04/12/2014 0012   CrCl cannot be calculated (Patient's most recent lab result is older than the maximum 21 days allowed.).  COAG Lab Results  Component Value Date   INR 1.5 (H) 02/22/2022   INR 1.3 (H) 02/21/2022   INR 1.2 02/20/2022    Radiology No results found.   Assessment/Plan 1. Lymphedema Recommend:  No surgery or intervention at this point in time.    I have reviewed my discussion with the patient regarding venous insufficiency and secondary lymph edema and why it  causes  symptoms. I have discussed with the patient the chronic skin changes that accompany these problems and the long term sequela such as ulceration and infection.  Patient will continue wearing graduated compression on a daily basis a prescription, if needed, was given to the patient to keep this updated. The patient will  put the compression on first thing in the morning and removing them in the evening. The patient is instructed specifically not to sleep in the compression.  In addition, behavioral modification including elevation during the day will be continued.  Diet and salt restriction will also be helpful.  The patient has been doing fairly well with compression and elevation.  We discussed lymphedema pump but he is not yet ready to move forward with that option at this time.  Patient return in 6 months to reevaluate lower extremity edema    2. Benign essential HTN Continue antihypertensive medications as already ordered, these medications have been reviewed and there are no changes at this time.    Georgiana Spinner, NP   05/31/2023 11:41 AM

## 2023-07-12 ENCOUNTER — Observation Stay
Admission: EM | Admit: 2023-07-12 | Discharge: 2023-07-14 | Disposition: A | Discharge status: Home / Self Care | Payer: Medicare PPO | Attending: Internal Medicine | Admitting: Internal Medicine

## 2023-07-12 ENCOUNTER — Encounter: Payer: Self-pay | Admitting: Internal Medicine

## 2023-07-12 ENCOUNTER — Other Ambulatory Visit: Payer: Self-pay

## 2023-07-12 ENCOUNTER — Emergency Department: Payer: Medicare PPO

## 2023-07-12 DIAGNOSIS — Z7901 Long term (current) use of anticoagulants: Secondary | ICD-10-CM | POA: Diagnosis not present

## 2023-07-12 DIAGNOSIS — N189 Chronic kidney disease, unspecified: Secondary | ICD-10-CM | POA: Insufficient documentation

## 2023-07-12 DIAGNOSIS — I2489 Other forms of acute ischemic heart disease: Secondary | ICD-10-CM | POA: Diagnosis not present

## 2023-07-12 DIAGNOSIS — D649 Anemia, unspecified: Secondary | ICD-10-CM | POA: Diagnosis not present

## 2023-07-12 DIAGNOSIS — I130 Hypertensive heart and chronic kidney disease with heart failure and stage 1 through stage 4 chronic kidney disease, or unspecified chronic kidney disease: Secondary | ICD-10-CM | POA: Insufficient documentation

## 2023-07-12 DIAGNOSIS — I5033 Acute on chronic diastolic (congestive) heart failure: Secondary | ICD-10-CM | POA: Diagnosis not present

## 2023-07-12 DIAGNOSIS — Z951 Presence of aortocoronary bypass graft: Secondary | ICD-10-CM | POA: Insufficient documentation

## 2023-07-12 DIAGNOSIS — I4891 Unspecified atrial fibrillation: Secondary | ICD-10-CM | POA: Diagnosis not present

## 2023-07-12 DIAGNOSIS — Z955 Presence of coronary angioplasty implant and graft: Secondary | ICD-10-CM | POA: Insufficient documentation

## 2023-07-12 DIAGNOSIS — I5043 Acute on chronic combined systolic (congestive) and diastolic (congestive) heart failure: Secondary | ICD-10-CM

## 2023-07-12 DIAGNOSIS — Z8679 Personal history of other diseases of the circulatory system: Secondary | ICD-10-CM | POA: Diagnosis not present

## 2023-07-12 DIAGNOSIS — R000 Tachycardia, unspecified: Secondary | ICD-10-CM | POA: Diagnosis present

## 2023-07-12 DIAGNOSIS — Z96642 Presence of left artificial hip joint: Secondary | ICD-10-CM | POA: Diagnosis not present

## 2023-07-12 DIAGNOSIS — I2510 Atherosclerotic heart disease of native coronary artery without angina pectoris: Secondary | ICD-10-CM | POA: Insufficient documentation

## 2023-07-12 DIAGNOSIS — Z85828 Personal history of other malignant neoplasm of skin: Secondary | ICD-10-CM | POA: Insufficient documentation

## 2023-07-12 DIAGNOSIS — R601 Generalized edema: Secondary | ICD-10-CM

## 2023-07-12 DIAGNOSIS — I480 Paroxysmal atrial fibrillation: Principal | ICD-10-CM | POA: Insufficient documentation

## 2023-07-12 DIAGNOSIS — E785 Hyperlipidemia, unspecified: Secondary | ICD-10-CM | POA: Diagnosis present

## 2023-07-12 DIAGNOSIS — I5023 Acute on chronic systolic (congestive) heart failure: Principal | ICD-10-CM

## 2023-07-12 DIAGNOSIS — N179 Acute kidney failure, unspecified: Secondary | ICD-10-CM

## 2023-07-12 DIAGNOSIS — Z79899 Other long term (current) drug therapy: Secondary | ICD-10-CM | POA: Insufficient documentation

## 2023-07-12 DIAGNOSIS — N400 Enlarged prostate without lower urinary tract symptoms: Secondary | ICD-10-CM | POA: Diagnosis present

## 2023-07-12 LAB — CBC
HCT: 34.3 % — ABNORMAL LOW (ref 39.0–52.0)
Hemoglobin: 10.8 g/dL — ABNORMAL LOW (ref 13.0–17.0)
MCH: 29.7 pg (ref 26.0–34.0)
MCHC: 31.5 g/dL (ref 30.0–36.0)
MCV: 94.2 fL (ref 80.0–100.0)
Platelets: 156 10*3/uL (ref 150–400)
RBC: 3.64 MIL/uL — ABNORMAL LOW (ref 4.22–5.81)
RDW: 13.8 % (ref 11.5–15.5)
WBC: 4.2 10*3/uL (ref 4.0–10.5)
nRBC: 0 % (ref 0.0–0.2)

## 2023-07-12 LAB — BASIC METABOLIC PANEL
Anion gap: 8 (ref 5–15)
BUN: 19 mg/dL (ref 8–23)
CO2: 22 mmol/L (ref 22–32)
Calcium: 8.8 mg/dL — ABNORMAL LOW (ref 8.9–10.3)
Chloride: 106 mmol/L (ref 98–111)
Creatinine, Ser: 1.26 mg/dL — ABNORMAL HIGH (ref 0.61–1.24)
GFR, Estimated: 57 mL/min — ABNORMAL LOW (ref >60)
Glucose, Bld: 111 mg/dL — ABNORMAL HIGH (ref 70–99)
Potassium: 3.5 mmol/L (ref 3.5–5.1)
Sodium: 136 mmol/L (ref 135–145)

## 2023-07-12 LAB — HEPATIC FUNCTION PANEL
ALT: 20 U/L (ref 0–44)
AST: 23 U/L (ref 15–41)
Albumin: 4 g/dL (ref 3.5–5.0)
Alkaline Phosphatase: 50 U/L (ref 38–126)
Bilirubin, Direct: 0.2 mg/dL (ref 0.0–0.2)
Indirect Bilirubin: 0.4 mg/dL (ref 0.3–0.9)
Total Bilirubin: 0.6 mg/dL (ref 0.3–1.2)
Total Protein: 7.2 g/dL (ref 6.5–8.1)

## 2023-07-12 LAB — BRAIN NATRIURETIC PEPTIDE: B Natriuretic Peptide: 319.3 pg/mL — ABNORMAL HIGH (ref 0.0–100.0)

## 2023-07-12 LAB — TROPONIN I (HIGH SENSITIVITY)
Troponin I (High Sensitivity): 27 ng/L — ABNORMAL HIGH (ref <18)
Troponin I (High Sensitivity): 25 ng/L — ABNORMAL HIGH (ref <18)

## 2023-07-12 LAB — MAGNESIUM: Magnesium: 2.5 mg/dL — ABNORMAL HIGH (ref 1.7–2.4)

## 2023-07-12 MED ORDER — APIXABAN 5 MG PO TABS
5.0000 mg | ORAL_TABLET | Freq: Two times a day (BID) | ORAL | Status: DC
  Administered 2023-07-12 – 2023-07-14 (×4): 5 mg via ORAL
  Filled 2023-07-12 (×4): qty 1

## 2023-07-12 MED ORDER — FUROSEMIDE 10 MG/ML IJ SOLN
40.0000 mg | Freq: Once | INTRAMUSCULAR | Status: AC
  Administered 2023-07-12: 40 mg via INTRAVENOUS
  Filled 2023-07-12: qty 4

## 2023-07-12 MED ORDER — CLONAZEPAM 1 MG PO TABS
1.0000 mg | ORAL_TABLET | Freq: Two times a day (BID) | ORAL | Status: DC | PRN
  Administered 2023-07-12: 1 mg via ORAL
  Filled 2023-07-12: qty 1

## 2023-07-12 MED ORDER — PROBENECID 500 MG PO TABS
500.0000 mg | ORAL_TABLET | Freq: Two times a day (BID) | ORAL | Status: DC
  Administered 2023-07-12 – 2023-07-14 (×4): 500 mg via ORAL
  Filled 2023-07-12 (×4): qty 1

## 2023-07-12 MED ORDER — NITROGLYCERIN 0.4 MG SL SUBL: 0.4000 mg | SUBLINGUAL_TABLET | SUBLINGUAL | Status: DC | PRN

## 2023-07-12 MED ORDER — MIDAZOLAM HCL 2 MG/2ML IJ SOLN: 2.0000 mg | Freq: Once | INTRAMUSCULAR | Status: DC

## 2023-07-12 MED ORDER — MONTELUKAST SODIUM 10 MG PO TABS
10.0000 mg | ORAL_TABLET | Freq: Every day | ORAL | Status: DC
  Administered 2023-07-12 – 2023-07-13 (×2): 10 mg via ORAL
  Filled 2023-07-12 (×2): qty 1

## 2023-07-12 MED ORDER — DILTIAZEM LOAD VIA INFUSION
10.0000 mg | Freq: Once | INTRAVENOUS | Status: AC
  Administered 2023-07-12: 10 mg via INTRAVENOUS
  Filled 2023-07-12: qty 10

## 2023-07-12 MED ORDER — SIMVASTATIN 20 MG PO TABS
20.0000 mg | ORAL_TABLET | Freq: Every day | ORAL | Status: DC
  Administered 2023-07-12 – 2023-07-13 (×2): 20 mg via ORAL
  Filled 2023-07-12 (×2): qty 1

## 2023-07-12 MED ORDER — METOPROLOL SUCCINATE ER 25 MG PO TB24
25.0000 mg | ORAL_TABLET | Freq: Every day | ORAL | Status: DC
  Administered 2023-07-12: 25 mg via ORAL
  Filled 2023-07-12: qty 1

## 2023-07-12 MED ORDER — DOXAZOSIN MESYLATE 2 MG PO TABS
2.0000 mg | ORAL_TABLET | Freq: Every day | ORAL | Status: DC
  Administered 2023-07-13 – 2023-07-14 (×2): 2 mg via ORAL
  Filled 2023-07-12 (×2): qty 1

## 2023-07-12 MED ORDER — DOCUSATE SODIUM 100 MG PO CAPS
100.0000 mg | ORAL_CAPSULE | Freq: Every morning | ORAL | Status: DC
  Administered 2023-07-13 – 2023-07-14 (×2): 100 mg via ORAL
  Filled 2023-07-12 (×2): qty 1

## 2023-07-12 MED ORDER — ETOMIDATE 2 MG/ML IV SOLN
8.0000 mg | Freq: Once | INTRAVENOUS | Status: AC
  Administered 2023-07-12: 8 mg via INTRAVENOUS
  Filled 2023-07-12: qty 10

## 2023-07-12 MED ORDER — METHOCARBAMOL 500 MG PO TABS
500.0000 mg | ORAL_TABLET | Freq: Two times a day (BID) | ORAL | Status: DC | PRN
  Administered 2023-07-13: 500 mg via ORAL
  Filled 2023-07-12: qty 1

## 2023-07-12 MED ORDER — DILTIAZEM HCL-DEXTROSE 125-5 MG/125ML-% IV SOLN (PREMIX)
5.0000 mg/h | INTRAVENOUS | Status: DC
  Administered 2023-07-12: 5 mg/h via INTRAVENOUS
  Filled 2023-07-12: qty 125

## 2023-07-12 MED ORDER — ONDANSETRON HCL 4 MG/2ML IJ SOLN: 4.0000 mg | Freq: Four times a day (QID) | INTRAMUSCULAR | Status: DC | PRN

## 2023-07-12 MED ORDER — ACETAMINOPHEN 650 MG RE SUPP: 650.0000 mg | Freq: Four times a day (QID) | RECTAL | Status: DC | PRN

## 2023-07-12 MED ORDER — SENNOSIDES-DOCUSATE SODIUM 8.6-50 MG PO TABS
2.0000 | ORAL_TABLET | Freq: Every day | ORAL | Status: DC
  Administered 2023-07-12 – 2023-07-13 (×2): 2 via ORAL
  Filled 2023-07-12 (×2): qty 2

## 2023-07-12 MED ORDER — ROPINIROLE HCL 1 MG PO TABS
1.0000 mg | ORAL_TABLET | Freq: Every day | ORAL | Status: DC
  Administered 2023-07-12 – 2023-07-13 (×2): 1 mg via ORAL
  Filled 2023-07-12 (×2): qty 1

## 2023-07-12 MED ORDER — ONDANSETRON HCL 4 MG PO TABS: 4.0000 mg | ORAL_TABLET | Freq: Four times a day (QID) | ORAL | Status: DC | PRN

## 2023-07-12 MED ORDER — FUROSEMIDE 10 MG/ML IJ SOLN
40.0000 mg | Freq: Two times a day (BID) | INTRAMUSCULAR | Status: DC
  Administered 2023-07-13 – 2023-07-14 (×3): 40 mg via INTRAVENOUS
  Filled 2023-07-12 (×3): qty 4

## 2023-07-12 MED ORDER — TRAMADOL HCL 50 MG PO TABS
50.0000 mg | ORAL_TABLET | Freq: Three times a day (TID) | ORAL | Status: DC | PRN
  Administered 2023-07-12: 50 mg via ORAL
  Filled 2023-07-12: qty 1

## 2023-07-12 MED ORDER — POTASSIUM CHLORIDE CRYS ER 20 MEQ PO TBCR
40.0000 meq | EXTENDED_RELEASE_TABLET | Freq: Once | ORAL | Status: AC
  Administered 2023-07-12: 40 meq via ORAL
  Filled 2023-07-12: qty 2

## 2023-07-12 MED ORDER — ONDANSETRON HCL 4 MG/2ML IJ SOLN
4.0000 mg | Freq: Once | INTRAMUSCULAR | Status: AC
  Administered 2023-07-12: 4 mg via INTRAVENOUS
  Filled 2023-07-12: qty 2

## 2023-07-12 MED ORDER — TRAZODONE HCL 50 MG PO TABS
50.0000 mg | ORAL_TABLET | Freq: Every evening | ORAL | Status: DC | PRN
  Administered 2023-07-12 – 2023-07-13 (×2): 50 mg via ORAL
  Filled 2023-07-12 (×2): qty 1

## 2023-07-12 MED ORDER — EZETIMIBE 10 MG PO TABS
10.0000 mg | ORAL_TABLET | Freq: Every day | ORAL | Status: DC
  Administered 2023-07-13: 10 mg via ORAL
  Filled 2023-07-12: qty 1

## 2023-07-12 MED ORDER — ACETAMINOPHEN 325 MG PO TABS: 650.0000 mg | ORAL_TABLET | Freq: Four times a day (QID) | ORAL | Status: DC | PRN

## 2023-07-12 NOTE — Assessment & Plan Note (Signed)
 Continue Zocor 

## 2023-07-12 NOTE — ED Triage Notes (Signed)
Patient went to his PCP's office today with c/o exertional dyspnea; In the office they found that his HR was in the 140s; EKG shows sinus tach at a rate of 141; Patient denies pain at this time

## 2023-07-12 NOTE — ED Notes (Signed)
VO from MD to decrease Cardizem drip to 5mg /hr related to heart rate in the 50's

## 2023-07-12 NOTE — ED Notes (Signed)
First Nurse Note: Patient sent from [facility] from SOB and tachycardia. HR in the 140's. Hx of afib.

## 2023-07-12 NOTE — ED Notes (Signed)
MD aware of pt's continued tachycardia

## 2023-07-12 NOTE — ED Notes (Signed)
VO from MD to stop cardizem drip related to Pt's HR remaining in the high 40's low 50's.

## 2023-07-12 NOTE — Sedation Documentation (Signed)
Frequent runs of vtach noted. MD, respiratory and RN's remain at bedside

## 2023-07-12 NOTE — ED Notes (Signed)
Consent performed by this RN and Pilar Jarvis, MD and signed by patient and located in patients chart.

## 2023-07-12 NOTE — ED Provider Notes (Signed)
[facility] Provider Note    Event Date/Time   First MD Initiated Contact with Patient 07/12/23 1203     (approximate)   History   Tachycardia (Patient went to his PCP's office today with c/o exertional dyspnea; In the office they found that his HR was in the 140s; EKG shows sinus tach at a rate of 141; Patient denies pain at this time)   HPI  Robert Rivas is a 82 y.o. male  here with weakness, leg swelling, SOB. Pt has a h/o AFib, CHF, CKD stage III/IV, on coumadin, h/o SVT, and presents with weakness, leg swelling, AFib RVR. Pt has had progressively worsening weakness and DOE over the past week, with 4-5 lb weight gain. He went to his PCP today and they noted him to be in AFib RVR so he was sent here for evaluation. No CP. He feels weaker than usual. Denies feeling his HR beating quickly or irregularly.      Physical Exam   Triage Vital Signs: ED Triage Vitals  Encounter Vitals Group     BP 07/12/23 1058 (!) 149/103     Systolic BP Percentile --      Diastolic BP Percentile --      Pulse Rate 07/12/23 1058 (!) 140     Resp 07/12/23 1058 20     Temp 07/12/23 1058 97.9 F (36.6 C)     Temp Source 07/12/23 1058 Oral     SpO2 07/12/23 1058 97 %     Weight 07/12/23 1059 222 lb (100.7 kg)     Height 07/12/23 1059 6\' 1"  (1.854 m)     Head Circumference --      Peak Flow --      Pain Score 07/12/23 1059 0     Pain Loc --      Pain Education --      Exclude from Growth Chart --     Most recent vital signs: Vitals:   07/12/23 1420 07/12/23 1430  BP: (!) 155/107 (!) 139/94  Pulse: (!) 139 (!) 140  Resp: (!) 23 17  Temp:    SpO2: 96% 93%     General: Awake, no distress.  CV:  Tachycardic, regular, with 2+ pulses. Resp:  Tachypneic with bilateral rales, but speaking in full sentences. Abd:  No distention. No tenderness. Other:  3+ pitting edema bl LE   ED Results / Procedures / Treatments   Labs (all labs ordered are listed, but only  abnormal results are displayed) Labs Reviewed  BASIC METABOLIC PANEL - Abnormal; Notable for the following components:      Result Value   Glucose, Bld 111 (*)    Creatinine, Ser 1.26 (*)    Calcium 8.8 (*)    GFR, Estimated 57 (*)    All other components within normal limits  CBC - Abnormal; Notable for the following components:   RBC 3.64 (*)    Hemoglobin 10.8 (*)    HCT 34.3 (*)    All other components within normal limits  MAGNESIUM - Abnormal; Notable for the following components:   Magnesium 2.5 (*)    All other components within normal limits  BRAIN NATRIURETIC PEPTIDE - Abnormal; Notable for the following components:   B Natriuretic Peptide 319.3 (*)    All other components within normal limits  TROPONIN I (HIGH SENSITIVITY) - Abnormal; Notable for the following components:   Troponin I (High Sensitivity) 27 (*)    All other components within  normal limits  TROPONIN I (HIGH SENSITIVITY) - Abnormal; Notable for the following components:   Troponin I (High Sensitivity) 25 (*)    All other components within normal limits  HEPATIC FUNCTION PANEL     EKG AFib RVR vs flutter with VR 141. PRQRS 98, QTc 477. Non specific ST changes likely rate related.   RADIOLOGY CXR: Cardiomegaly, bibasilar atelectasis   I also independently reviewed and agree with radiologist interpretations.   PROCEDURES:  Critical Care performed: Yes, see critical care procedure note(s)  .Critical Care  Performed by: Shaune Pollack, MD Authorized by: Shaune Pollack, MD   Critical care provider statement:    Critical care time (minutes):  30   Critical care time was exclusive of:  Separately billable procedures and treating other patients   Critical care was necessary to treat or prevent imminent or life-threatening deterioration of the following conditions:  Cardiac failure, circulatory failure and respiratory failure   Critical care was time spent personally by me on the following  activities:  Development of treatment plan with patient or surrogate, discussions with consultants, evaluation of patient's response to treatment, examination of patient, ordering and review of laboratory studies, ordering and review of radiographic studies, ordering and performing treatments and interventions, pulse oximetry, re-evaluation of patient's condition and review of old charts     MEDICATIONS ORDERED IN ED: Medications  diltiazem (CARDIZEM) 1 mg/mL load via infusion 10 mg (10 mg Intravenous Bolus from Bag 07/12/23 1249)    And  diltiazem (CARDIZEM) 125 mg in dextrose 5% 125 mL (1 mg/mL) infusion (15 mg/hr Intravenous Rate/Dose Change 07/12/23 1350)  etomidate (AMIDATE) injection 8 mg (has no administration in time range)  ondansetron (ZOFRAN) injection 4 mg (has no administration in time range)  furosemide (LASIX) injection 40 mg (40 mg Intravenous Given 07/12/23 1240)     IMPRESSION / MDM / ASSESSMENT AND PLAN / ED COURSE  I reviewed the triage vital signs and the nursing notes.                              Differential diagnosis includes, but is not limited to, AFib RVR, CHF exacerbation, PNA, PE, anemia  Patient's presentation is most consistent with acute presentation with potential threat to life or bodily function.  The patient is on the cardiac monitor to evaluate for evidence of arrhythmia and/or significant heart rate changes  82 yo M here with SOB, exam consistent with CHF with AFib RVR. Suspect possible AFlutter as pt has had minimal response to Dilt bolus and gtt. H/o same with intermittent bradycardia limiting AV nodal blockade in past per review of his Cards records. No signs of ischemia. He does appear hypervolemia and IV lasix was given. Cardiology Dr. Melton Alar consulted and evaluated pt at bedside. They recommend DCCV and admission for diuresis. Pt has tolerated DCCV in the past well and is amenable. Consent signed. Plan to admit after cardioversion.     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   Shaune Pollack, MD 07/12/23 [phone]

## 2023-07-12 NOTE — Assessment & Plan Note (Addendum)
Patient was diuresed with Lasix 40 mg IV twice daily.  Patient's weight went down from 222 down to 204.7 pounds.  Patient's legs are still swollen.

## 2023-07-12 NOTE — Assessment & Plan Note (Signed)
 On Cardura

## 2023-07-12 NOTE — ED Notes (Signed)
Family at bedside at this time

## 2023-07-12 NOTE — ED Notes (Signed)
MD at bedside. 

## 2023-07-12 NOTE — Assessment & Plan Note (Addendum)
Atrial fibrillation with rapid ventricular response.  He was initially placed on Cardizem drip.  Status post cardioversion in the emergency room. Cardizem drip was discontinued.  Cardiology restarted amiodarone to 200 mg twice a day for 14 days then 200 mg daily after that.  Cardiology decreased dose of Toprol-XL.  Patient on Eliquis 2.5 mg twice a day.  If creatinine improves as outpatient consider increasing up to 5 mg twice a day.  Patient with heart rate in the 50s and 60s upon discharge.

## 2023-07-12 NOTE — Consult Note (Addendum)
[facility] CLINIC CARDIOLOGY CONSULT NOTE       Patient ID: Robert Rivas MRN: [id number] DOB/AGE: Apr 02, 1941 82 y.o.  Admit date: 07/12/2023 Referring Physician Dr. Reinaldo Meeker  Primary Physician Dr. Judithann Sheen  Primary Cardiologist previous Dr. Gwen Pounds  Reason for Consultation atrial flutter RVR   HPI: Robert Rivas is an 82yoM with a PMH of paroxysmal AF (ablation 2022), paroxysmal SVT (repeat RA ablation 2018, initial ablation 2002), CAD s/p CABG x4 (LIMA-LAD, SVT-D1, RIMA-OM1, SVG-OM2 1992) and hx multiple PCIs (most recently OM2 graft in 2002), HFpEF (50%, mod biatrial enlargement 01/2021) who presented to an acute visit the afternoon of 07/12/2023 with shortness of breath for 3-4 days with increased LE swelling, HR in clinic was elevated to 140, and the patient was referred immediately to [facility] ED for further management. EKG in the ED showed atrial flutter RVR. Cardiology is consulted for further assistance with his AFL.   The patient provides the entirety of the history.  He presented to his PCP for worsening shortness of breath and dyspnea on exertion for the past 3 to 4 days with associated bilateral lower extremity swelling, right worse than left.  Been very fatigued and HR in clinic was elevated to 140.  He says his wife fixes his pillbox for him but denies missed medications, specifically eliquis.  In the emergency department patient was given IV diltiazem 10 mg x 1 and started on a diltiazem infusion without improvement in his heart rate or change in rhythm.  He is having a bilateral lower extremity Doppler ultrasound performed evaluation and states he feels okay but still feels short of breath he does not take daily Lasix at home to his knowledge, he is urinated a significant amount after initial IV Lasix. Denies chest pain, heart racing or palpitations.  No cough, nasal congestion, fevers, or known sick contacts.  Went for a trip to the beach in early June but otherwise no recent  prolonged immobility/travel.  Labs notable for BUN/creatinine around baseline at 19/1.26 and GFR 57.  Potassium low normal at 3.5. mag WNL 2.5. High-sensitivity troponin borderline elevated and flat trending at 27, 25.  BNP 319. H&H around baseline at 10.8/34.3. Dr. Penne Lash d/w Dr. Melton Alar regarding management of his atrial flutter and recommended continued diuresis and  bedside DCCV if his rate/rhythm are unchanged.   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   L upper back 5.0cm inf to base of neck, 5.0cm lat to spine   Hyperlipidemia    Hypertension    MI, old    Nocturia    Phlebitis    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   SCC IS R foreearm distal   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   SCC IS R forearm proximal   Squamous cell carcinoma of skin 03/26/2018   SCC IS R forearm   Squamous cell  carcinoma of skin 01/01/2008   SCC IS R forearm   Wears dentures    partial   Wears hearing aid in both ears     Past Surgical History:  Procedure Laterality Date   cardaic stents     CARDIAC CATHETERIZATION     PTCA with Stent Placement   CARDIOVERSION N/A 04/13/2021   Procedure: CARDIOVERSION;  Surgeon: Lamar Blinks, MD;  Location: [facility] ORS;  Service: Cardiovascular;  Laterality: N/A;   CARDIOVERSION N/A 05/05/2021   Procedure: CARDIOVERSION;  Surgeon: Lamar Blinks, MD;  Location: [facility] ORS;  Service: Cardiovascular;  Laterality: N/A;   CATARACT EXTRACTION W/PHACO Left 01/12/2023    Procedure: CATARACT EXTRACTION PHACO AND INTRAOCULAR LENS PLACEMENT (IOC) LEFT COMPLICATED MALYUGIN OMIDRIA   12.02  01:30.8;  Surgeon: Estanislado Pandy, MD;  Location: [facility];  Service: Ophthalmology;  Laterality: Left;   CATARACT EXTRACTION W/PHACO Right 01/26/2023   Procedure: CATARACT EXTRACTION PHACO AND INTRAOCULAR LENS PLACEMENT (IOC) RIGHT OMIDRIA  COMPLICATED  5.84  00:52.1;  Surgeon: Estanislado Pandy, MD;  Location: [facility];  Service: Ophthalmology;  Laterality: Right;   COLONOSCOPY WITH PROPOFOL N/A 12/15/2015   Procedure: COLONOSCOPY WITH PROPOFOL;  Surgeon: Christena Deem, MD;  Location: [facility];  Service: Endoscopy;  Laterality: N/A;   CORONARY ARTERY BYPASS GRAFT  1992   ECTROPION REPAIR Right 12/12/2017   Procedure: REPAIR OF ECTROPION SUTURES/EXTENSIVE RIGHT;  Surgeon: Imagene Riches, MD;  Location: [facility];  Service: Ophthalmology;  Laterality: Right;   HERNIA REPAIR     HIP ARTHROPLASTY Left 02/19/2022   Procedure: ARTHROPLASTY BIPOLAR HIP (HEMIARTHROPLASTY);  Surgeon: Juanell Fairly, MD;  Location: [facility] ORS;  Service: Orthopedics;  Laterality: Left;   IR KYPHO LUMBAR INC FX REDUCE BONE BX UNI/BIL CANNULATION INC/IMAGING  06/07/2022   IR RADIOLOGIST EVAL & MGMT  06/30/2022   SVT ABLATION  02/22/2017   [facility]    (Not in a hospital admission)  Social History   Socioeconomic History   Marital status: Married    Spouse name: Not on file   Number of children: Not on file   Years of education: Not on file   Highest education level: Not on file  Occupational History   Not on file  Tobacco Use   Smoking status: Never   Smokeless tobacco: Never  Vaping Use   Vaping status: Never Used  Substance and Sexual Activity   Alcohol use: No   Drug use: No   Sexual activity: Not on file  Other Topics Concern   Not on file  Social History Narrative   Not on file   Social Determinants of Health   Financial Resource Strain:  Not on file  Food Insecurity: Not on file  Transportation Needs: Not on file  Physical Activity: Not on file  Stress: Not on file  Social Connections: Not on file  Intimate Partner Violence: Not on file    Family History  Problem Relation Age of Onset   Kidney cancer Maternal Uncle    Prostate cancer Neg Hx    Bladder Cancer Neg Hx       Intake/Output Summary (Last 24 hours) at 07/12/2023 1539 Last data filed at 07/12/2023 1524 Gross per 24 hour  Intake --  Output 2150 ml  Net -2150 ml    Vitals:   07/12/23 1350 07/12/23 1420 07/12/23 1430 07/12/23 1530  BP: (!) 147/113 (!) 155/107 (!) 139/94 (!) 143/103  Pulse: (!) 139 (!) 139 (!) 140 Marland Kitchen)  144  Resp: (!) 21 (!) 23 17 (!) 23  Temp:      TempSrc:      SpO2: 98% 96% 93% 97%  Weight:      Height:        PHYSICAL EXAM General: pleasant elderly ill appearing caucasian male , well nourished, in no acute distress. HEENT:  Normocephalic and atraumatic. Neck:  No JVD.  Lungs: conversational dyspnea on room air. Decreased breath sounds  Heart: tachy irreg irreg . Normal S1 and S2 without gallops or murmurs.  Abdomen: Non-distended appearing with excess adiposity.  Msk: Normal strength and tone for age. Extremities: Warm and well perfused. No clubbing, cyanosis. 1-2+ bilateral LE edema.  Neuro: Alert and oriented X 3. Psych:  Answers questions appropriately.   Labs: Basic Metabolic Panel: Recent Labs    07/12/23 1103  NA 136  K 3.5  CL 106  CO2 22  GLUCOSE 111*  BUN 19  CREATININE 1.26*  CALCIUM 8.8*  MG 2.5*   Liver Function Tests: Recent Labs    07/12/23 1103  AST 23  ALT 20  ALKPHOS 50  BILITOT 0.6  PROT 7.2  ALBUMIN 4.0   No results for input(s): "LIPASE", "AMYLASE" in the last 72 hours. CBC: Recent Labs    07/12/23 1103  WBC 4.2  HGB 10.8*  HCT 34.3*  MCV 94.2  PLT 156   Cardiac Enzymes: Recent Labs    07/12/23 1103 07/12/23 1247  TROPONINIHS 27* 25*   BNP: Recent Labs     07/12/23 1103  BNP 319.3*   D-Dimer: No results for input(s): "DDIMER" in the last 72 hours. Hemoglobin A1C: No results for input(s): "HGBA1C" in the last 72 hours. Fasting Lipid Panel: No results for input(s): "CHOL", "HDL", "LDLCALC", "TRIG", "CHOLHDL", "LDLDIRECT" in the last 72 hours. Thyroid Function Tests: No results for input(s): "TSH", "T4TOTAL", "T3FREE", "THYROIDAB" in the last 72 hours.  Invalid input(s): "FREET3" Anemia Panel: No results for input(s): "VITAMINB12", "FOLATE", "FERRITIN", "TIBC", "IRON", "RETICCTPCT" in the last 72 hours.   Radiology: US Venous Img Lower Bilateral  Result Date: 07/12/2023 CLINICAL DATA:  Bilateral lower extremity swelling EXAM: BILATERAL LOWER EXTREMITY VENOUS DOPPLER ULTRASOUND TECHNIQUE: Gray-scale sonography with graded compression, as well as color Doppler and duplex ultrasound were performed to evaluate the lower extremity deep venous systems from the level of the common femoral vein and including the common femoral, femoral, profunda femoral, popliteal and calf veins including the posterior tibial, peroneal and gastrocnemius veins when visible. The superficial great saphenous vein was also interrogated. Spectral Doppler was utilized to evaluate flow at rest and with distal augmentation maneuvers in the common femoral, femoral and popliteal veins. COMPARISON:  None Available. FINDINGS: RIGHT LOWER EXTREMITY Common Femoral Vein: No evidence of thrombus. Normal compressibility, respiratory phasicity and response to augmentation. Saphenofemoral Junction: No evidence of thrombus. Normal compressibility and flow on color Doppler imaging. Profunda Femoral Vein: No evidence of thrombus. Normal compressibility and flow on color Doppler imaging. Femoral Vein: No evidence of thrombus. Normal compressibility, respiratory phasicity and response to augmentation. Popliteal Vein: No evidence of thrombus. Normal compressibility, respiratory phasicity and response  to augmentation. Calf Veins: No evidence of thrombus. Normal compressibility and flow on color Doppler imaging. Superficial Great Saphenous Vein: No evidence of thrombus. Normal compressibility. Venous Reflux:  None. Other Findings:  Superficial subcutaneous edema LEFT LOWER EXTREMITY Common Femoral Vein: No evidence of thrombus. Normal compressibility, respiratory phasicity and response to augmentation. Saphenofemoral Junction: No evidence of thrombus. Normal compressibility and  flow on color Doppler imaging. Profunda Femoral Vein: No evidence of thrombus. Normal compressibility and flow on color Doppler imaging. Femoral Vein: No evidence of thrombus. Normal compressibility, respiratory phasicity and response to augmentation. Popliteal Vein: No evidence of thrombus. Normal compressibility, respiratory phasicity and response to augmentation. Calf Veins: No evidence of thrombus. Normal compressibility and flow on color Doppler imaging. Superficial Great Saphenous Vein: No evidence of thrombus. Normal compressibility. Venous Reflux:  None. Other Findings:  Superficial subcutaneous edema IMPRESSION: No evidence of deep venous thrombosis in either lower extremity. Electronically Signed   By: Malachy Moan M.D.   On: 07/12/2023 15:36   DG Chest 2 View  Result Date: 07/12/2023 CLINICAL DATA:  exertional dyspnea EXAM: CHEST - 2 VIEW COMPARISON:  CXR 02/14/22 FINDINGS: Status post median sternotomy and CABG. Cardiomegaly. Pleural effusion. No pneumothorax. There are hazy bibasilar airspace opacities, favored to represent atelectasis. No radiographically apparent displaced rib fractures. Visualized upper abdomen unremarkable. Redemonstrated compression deformity in the lower thoracic spine. IMPRESSION: 1.  Cardiomegaly 2.  Bibasilar atelectasis Electronically Signed   By: Lorenza Cambridge M.D.   On: 07/12/2023 11:58    ECHO 01/27/2021 DOPPLER ECHO and OTHER SPECIAL PROCEDURES                 Aortic: TRIVIAL AR                  No AS                         103.8 cm/sec peak vel      4.3 mmHg peak grad                         2.3 mmHg mean grad         3.8 cm^2 by DOPPLER                 Mitral: MODERATE MR                No MS                         MV Inflow E Vel = 107.0 cm/sec      MV Annulus E'Vel = nm*                         E/E'Ratio = nm*              Tricuspid: MODERATE TR                No TS                         263.9 cm/sec peak TR vel   32.9 mmHg peak RV pressure              Pulmonary: TRIVIAL PR                 No PS  _____________________________________________________________________ INTERPRETATION  NORMAL LEFT VENTRICULAR SYSTOLIC FUNCTION  NORMAL RIGHT VENTRICULAR SYSTOLIC FUNCTION  MODERATE VALVULAR REGURGITATION (See above)  NO VALVULAR STENOSIS  MODERATE BIATRIAL ENLARGEMENT  MILD RV ENLARGEMENT  MILD LVH  MILDLY DILATED AORTIC ROOT MEASURING UP TO 4.2 CM  MILDLY DILATED ASCENDING AORTA MEASURING UP TO 4.3 CM  _____________________________________________________________________ Electronically signed by      MD Arnoldo Hooker on 01/27/2021 04: 01 PM  Performed By: Luretha Murphy, RDCS, RVT     Ordering Physician: Ezekiel Ina   TELEMETRY reviewed by me (LT) 07/12/2023 : atrial flutter RVR rate 140   EKG reviewed by me: AFL 141  Data reviewed by me (LT) 07/12/2023: nursing notes, last 24h vitals tele labs imaging I/O    Active Problems:   * No active hospital problems. *    ASSESSMENT AND PLAN:  Robert Rivas is an 42yoM with a PMH of paroxysmal AF (ablation 2022), paroxysmal SVT (repeat RA ablation 2018, initial ablation 2002), CAD s/p CABG x4 (LIMA-LAD, SVT-D1, RIMA-OM1, SVG-OM2 1992) and hx multiple PCIs (most recently OM2 graft in 2002), HFpEF (50%, mod biatrial enlargement 01/2021) who presented to an acute visit the afternoon of 07/12/2023 with shortness of breath for 3-4 days with increased LE swelling, HR in clinic was elevated to 140, and the patient  was referred immediately to [facility] ED for further management. EKG in the ED showed atrial flutter RVR. Cardiology is consulted for further assistance with his AFL.   # paroxysmal AFL RVR Presents with 3 to 4 days of worsening dyspnea on exertion and associated bilateral lower extremity swelling, and atrial flutter with RVR admission EKG, refractory to IV diltiazem. As below, clinically hypervolemic with pitting LE edema, likely volume overload provoking AFL RVR.  - agree with continuing diltiazem gtt and diuresis as below - if no change in rate or rhythm after IV lasix, Dr. Melton Alar recommends proceeding with DCCV.  - continue eliquis 5mg  BID for stroke prevention (meets this criteria with weight >60kg and Cr <1.5) - add back PO metoprolol tartrate as BP allows  - monitor and replenish electrolytes for K>4 and mag>2  # acute on chronic HFpEF  Clinically hypervolemic with pitting edema bilaterally, bnp in the 300s. Not on a daily diuretic at home.  - repeat echo complete once HR better controlled / or following DCCV - continue diuresis with IV lasix 40mg  BID for now - will adjust GDMT as BP and renal function allow   # CAD s/p CABG, hx multiple prior PCIs # demand ischemia  Chest pain free, EKG nonischemic. Trop elevation most consistent with demand/supply mismatch and not ACS.  - continue home statin  - eliquis monotherapy   This patient's plan of care was discussed and created with Dr. Melton Alar and she is in agreement.  Signed: Rebeca Allegra , PA-C 07/12/2023, 3:39 PM [facility]

## 2023-07-12 NOTE — ED Provider Notes (Signed)
Signout received. AF RVR on diltiazem drip, still tachy and HTN. Getting [place]. Getting diuresis. Plan to cardioversion w etomidate, consented by previous provider for procedure and meds. Afterwards will be admitted.   CV tolerated. NSR brief then back to AF with some NSVT, AF  rate in 80-90s, normotension; con't on diltizem drip per cardiology. Admit   .Cardioversion  Date/Time: 07/12/2023 3:36 PM  Performed by: Pilar Jarvis, MD Authorized by: Pilar Jarvis, MD   Consent:    Consent obtained:  Verbal   Consent given by:  Patient Pre-procedure details:    Rhythm:  Atrial fibrillation   Electrode placement:  Anterior-posterior Patient sedated: Yes. Refer to sedation procedure documentation for details of sedation.  Attempt one:    Cardioversion mode:  Synchronous   Shock (Joules):  200   Shock outcome:  Conversion to other rhythm (sinus then AF normal rate some NSVT) Post-procedure details:    Patient status:  Awake   Patient tolerance of procedure:  Tolerated well, no immediate complications      Pilar Jarvis, MD 07/12/23 [phone]

## 2023-07-12 NOTE — H&P (Signed)
History and Physical    [name]:[id number] DOB: Feb 19, 1941 DOA: 07/12/2023 DOS: the patient was seen and examined on 07/12/2023 PCP: Marguarite Arbour, MD  Patient coming from: Home  Chief Complaint:  Chief Complaint  Patient presents with   Tachycardia    Patient went to his PCP's office today with c/o exertional dyspnea; In the office they found that his HR was in the 140s; EKG shows sinus tach at a rate of 141; Patient denies pain at this time   HPI: Robert Rivas is a 82 y.o. male with medical history significant of paroxysmal atrial fibrillation, CAD, BPH, CKD, hypertension, hyperlipidemia.  Patient has been very fatigued for the last few days.  Very exhausted.  He had a little short of breath and a dry cough.  Came to the ER for further evaluation and found to be in rapid atrial fibrillation.  Patient was placed on Cardizem drip.  Patient was cardioverted by the ER team.  Heart rates than dropped into the 40s and Cardizem drip was discontinued.  Hospitalist services contacted for further evaluation.  Will admit as observation.  No complaints of chest pain. Review of Systems: Review of Systems  Constitutional:  Positive for malaise/fatigue. Negative for chills and fever.  HENT:  Positive for hearing loss.   Eyes:  Negative for blurred vision.  Respiratory:  Positive for cough and shortness of breath.   Cardiovascular:  Negative for chest pain.  Gastrointestinal:  Negative for abdominal pain, constipation, diarrhea, nausea and vomiting.  Genitourinary:  Negative for dysuria.  Musculoskeletal:  Positive for back pain.  Skin:  Negative for rash.  Neurological:  Negative for dizziness.  Endo/Heme/Allergies:  Does not bruise/bleed easily.  Psychiatric/Behavioral:  Negative for depression.     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   L upper back 5.0cm inf to base of neck, 5.0cm lat to spine   Hyperlipidemia    Hypertension    MI, old    Nocturia    Phlebitis    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   SCC IS R foreearm distal   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   SCC IS R forearm proximal   Squamous cell carcinoma of skin 03/26/2018   SCC IS R forearm   Squamous cell carcinoma of skin 01/01/2008   SCC IS R forearm   Wears dentures    partial   Wears hearing aid in both ears    Past Surgical History:  Procedure Laterality Date   cardaic stents     CARDIAC CATHETERIZATION     PTCA with Stent Placement   CARDIOVERSION N/A 04/13/2021   Procedure: CARDIOVERSION;  Surgeon: Lamar Blinks, MD;  Location: [facility] ORS;  Service: Cardiovascular;  Laterality: N/A;   CARDIOVERSION N/A 05/05/2021   Procedure: CARDIOVERSION;  Surgeon: Lamar Blinks, MD;  Location: [facility] ORS;  Service: Cardiovascular;  Laterality: N/A;   CATARACT EXTRACTION W/PHACO Left 01/12/2023  Procedure: CATARACT EXTRACTION PHACO AND INTRAOCULAR LENS PLACEMENT (IOC) LEFT COMPLICATED MALYUGIN OMIDRIA   12.02  01:30.8;  Surgeon: Estanislado Pandy, MD;  Location: [facility];  Service: Ophthalmology;  Laterality: Left;   CATARACT EXTRACTION W/PHACO Right 01/26/2023   Procedure: CATARACT EXTRACTION PHACO AND INTRAOCULAR LENS PLACEMENT (IOC) RIGHT OMIDRIA  COMPLICATED  5.84  00:52.1;  Surgeon: Estanislado Pandy, MD;  Location: [facility];  Service: Ophthalmology;  Laterality: Right;   COLONOSCOPY WITH PROPOFOL N/A 12/15/2015   Procedure: COLONOSCOPY WITH PROPOFOL;  Surgeon: Christena Deem, MD;  Location: [facility];  Service:  Endoscopy;  Laterality: N/A;   CORONARY ARTERY BYPASS GRAFT  1992   ECTROPION REPAIR Right 12/12/2017   Procedure: REPAIR OF ECTROPION SUTURES/EXTENSIVE RIGHT;  Surgeon: Imagene Riches, MD;  Location: [facility];  Service: Ophthalmology;  Laterality: Right;   HERNIA REPAIR     HIP ARTHROPLASTY Left 02/19/2022   Procedure: ARTHROPLASTY BIPOLAR HIP (HEMIARTHROPLASTY);  Surgeon: Juanell Fairly, MD;  Location: [facility];  Service: Orthopedics;  Laterality: Left;   IR KYPHO LUMBAR INC FX REDUCE BONE BX UNI/BIL CANNULATION INC/IMAGING  06/07/2022   IR RADIOLOGIST EVAL & MGMT  06/30/2022   SVT ABLATION  02/22/2017   [facility]   Social History:  reports that he has never smoked. He has never used smokeless tobacco. He reports that he does not drink alcohol and does not use drugs.  Allergies  Allergen Reactions   Codeine Nausea And Vomiting and Nausea Only   Tizanidine Other (See Comments)    drowsiness    Family History  Problem Relation Age of Onset   CAD Father    Kidney cancer Maternal Uncle    Prostate cancer Neg Hx    Bladder Cancer Neg Hx     Prior to Admission medications   Medication Sig Start Date End Date Taking? Authorizing Provider  amiodarone (PACERONE) 200 MG tablet Take 200 mg by mouth daily. 01/28/22   [provider]  amLODipine (NORVASC) 5 MG tablet Take 5 mg by mouth daily.    [provider]  apixaban (ELIQUIS) 2.5 MG TABS tablet Take 2.5 mg by mouth 2 (two) times daily. 03/14/22   [provider]  calcipotriene (DOVONOX) 0.005 % cream Apply topically 2 (two) times daily. 05/16/23   Moye, [place], MD  clonazePAM (KLONOPIN) 1 MG tablet Take 1 mg by mouth 2 (two) times daily as needed for anxiety.    [provider]  Cyanocobalamin (VITAMIN B-12 IJ) Inject 1,000 mcg as directed every 30 (thirty) days.    [provider]  docusate sodium (COLACE) 100 MG capsule Take 100 mg by mouth in the morning.    [provider]   doxazosin (CARDURA) 2 MG tablet Take 2 mg by mouth daily.    [provider]  ezetimibe (ZETIA) 10 MG tablet Take 10 mg by mouth at bedtime.     [provider]  fluorouracil (EFUDEX) 5 % cream Apply topically 2 (two) times daily. 05/16/23   Moye, [place], MD  furosemide (LASIX) 20 MG tablet Take 20 mg by mouth daily as needed.    [provider]  hydrALAZINE (APRESOLINE) 50 MG tablet Take 50 mg by mouth 2 (two) times daily. 11/25/21   [provider]  ketoconazole (NIZORAL) 2 % cream Apply 1 Application topically at bedtime. Qhs to feet 03/02/23   Deirdre Evener, MD  methocarbamol (ROBAXIN) 500 MG tablet Take 500 mg by mouth 2 (two) times daily.  [provider]  montelukast (SINGULAIR) 10 MG tablet Take 10 mg by mouth at bedtime. 08/12/16   [provider]  nitroGLYCERIN (NITROSTAT) 0.4 MG SL tablet Place 0.4 mg under the tongue every 5 (five) minutes x 3 doses as needed for chest pain. 04/09/14   [provider]  olmesartan (BENICAR) 40 MG tablet Take 40 mg by mouth in the morning and at bedtime.    [provider]  probenecid (BENEMID) 500 MG tablet Take 500 mg by mouth 2 (two) times daily.    [provider]  probenecid (BENEMID) 500 MG tablet Take 1 tablet by mouth daily. 06/14/14   [provider]  rOPINIRole (REQUIP) 1 MG tablet Take 1 mg by mouth at bedtime.    [provider]  sennosides-docusate sodium (SENOKOT-S) 8.6-50 MG tablet Take 2 tablets by mouth in the morning and at bedtime.    [provider]  simvastatin (ZOCOR) 20 MG tablet Take 20 mg by mouth at bedtime. 04/24/19   [provider]  SYRINGE-NEEDLE, DISP, 3 ML 25G X 1" 3 ML MISC Use 1 Syringe monthly. 05/07/15   [provider]  traMADol (ULTRAM) 50 MG tablet Take 50 mg by mouth 3 (three) times daily as needed. 05/06/22   [provider]  traZODone (DESYREL) 50 MG tablet     [provider]   Medication reconciliation still undergoing. Physical Exam: Vitals:   07/12/23 1635 07/12/23 1645 07/12/23 1700 07/12/23 1705  BP: 110/66 110/70 (!) 136/100   Pulse: (!) 50 (!) 51 (!) 57 (!) 57  Resp: 18 18 19 19   Temp:      TempSrc:      SpO2: 93% 95% 97% 98%  Weight:      Height:       Physical Exam HENT:     Head: Normocephalic.     Mouth/Throat:     Pharynx: No oropharyngeal exudate.  Eyes:     General: Lids are normal.     Conjunctiva/sclera: Conjunctivae normal.  Cardiovascular:     Rate and Rhythm: Regular rhythm. Bradycardia present.     Heart sounds: Normal heart sounds, S1 normal and S2 normal.  Pulmonary:     Breath sounds: Examination of the right-lower field reveals decreased breath sounds. Examination of the left-lower field reveals decreased breath sounds. Decreased breath sounds present. No wheezing, rhonchi or rales.  Abdominal:     Palpations: Abdomen is soft.     Tenderness: There is no abdominal tenderness.  Musculoskeletal:     Right lower leg: Swelling present.     Left lower leg: Swelling present.  Skin:    General: Skin is warm.     Comments: Chronic lower extremity skin discoloration.  Neurological:     Mental Status: He is alert and oriented to person, place, and time.     Data Reviewed: First EKG showed sinus tachycardia 141 bpm, incomplete right bundle branch block and nonspecific ST-T wave changes, interpreted by me Second EKG shows sinus rhythm 77 bpm flipped T waves laterally, interpreted by me 30 EKG shows atrial fibrillation 136 bpm nonspecific ST-T wave changes.  Interpreted by me Hemoglobin 10.8, BNP 319.3, creatinine 1.26, potassium 3.5, troponin 27 and 25  Assessment and Plan: * Atrial fibrillation with RVR (HCC) Atrial fibrillation with rapid ventricular response.  He was initially placed on Cardizem drip.  Status post cardioversion in the emergency room.  Now sinus bradycardia.  Cardizem drip was discontinued.  Will observe  overnight.  Acute on chronic diastolic CHF (congestive heart failure) (HCC) Will give Lasix 40 mg now and 40 mg twice daily.  Likely related to fast heart rate.  Anasarca Will give IV Lasix.  History of CAD (coronary artery disease) Continue Toprol.  Hyperlipidemia, unspecified Continue Zocor  BPH (benign prostatic hyperplasia) On Cardura      Advance Care Planning: Full code  Consults: Cardiology  Family Communication: Spoke with wife at bedside  Severity of Illness: The appropriate patient status for this patient is OBSERVATION. Observation status is judged to be reasonable and necessary in order to provide the required intensity of service to ensure the patient's safety. The patient's presenting symptoms, physical exam findings, and initial radiographic and laboratory data in the context of their medical condition is felt to place them at decreased risk for further clinical deterioration. Furthermore, it is anticipated that the patient will be medically stable for discharge from the hospital within 2 midnights of admission.   Author: Alford Highland, MD 07/12/2023 5:14 PM  For on call review [email or web address].

## 2023-07-12 NOTE — Assessment & Plan Note (Deleted)
Continue diuresis with Lasix 40 mg IV twice daily.  Likely related to fast heart rate.  Patient on low-dose Toprol.  Echocardiogram pending read.

## 2023-07-12 NOTE — Sedation Documentation (Signed)
Synchronized cardioversion performed at 200 jules

## 2023-07-12 NOTE — ED Notes (Signed)
Cardiologist at bedside.  

## 2023-07-12 NOTE — Assessment & Plan Note (Signed)
-   Continue Toprol 

## 2023-07-13 ENCOUNTER — Observation Stay
Admit: 2023-07-13 | Discharge: 2023-07-13 | Disposition: A | Discharge status: Home / Self Care | Payer: Medicare PPO | Attending: Internal Medicine | Admitting: Internal Medicine

## 2023-07-13 ENCOUNTER — Other Ambulatory Visit: Payer: Self-pay

## 2023-07-13 DIAGNOSIS — I480 Paroxysmal atrial fibrillation: Secondary | ICD-10-CM | POA: Diagnosis not present

## 2023-07-13 DIAGNOSIS — Z8679 Personal history of other diseases of the circulatory system: Secondary | ICD-10-CM | POA: Diagnosis not present

## 2023-07-13 DIAGNOSIS — R601 Generalized edema: Secondary | ICD-10-CM | POA: Diagnosis not present

## 2023-07-13 DIAGNOSIS — I4891 Unspecified atrial fibrillation: Secondary | ICD-10-CM | POA: Diagnosis not present

## 2023-07-13 DIAGNOSIS — D649 Anemia, unspecified: Secondary | ICD-10-CM

## 2023-07-13 DIAGNOSIS — N1831 Chronic kidney disease, stage 3a: Secondary | ICD-10-CM

## 2023-07-13 DIAGNOSIS — I5033 Acute on chronic diastolic (congestive) heart failure: Secondary | ICD-10-CM | POA: Diagnosis not present

## 2023-07-13 LAB — BASIC METABOLIC PANEL
Anion gap: 10 (ref 5–15)
BUN: 23 mg/dL (ref 8–23)
CO2: 24 mmol/L (ref 22–32)
Calcium: 8.6 mg/dL — ABNORMAL LOW (ref 8.9–10.3)
Chloride: 104 mmol/L (ref 98–111)
Creatinine, Ser: 1.58 mg/dL — ABNORMAL HIGH (ref 0.61–1.24)
GFR, Estimated: 43 mL/min — ABNORMAL LOW (ref >60)
Glucose, Bld: 104 mg/dL — ABNORMAL HIGH (ref 70–99)
Potassium: 4.1 mmol/L (ref 3.5–5.1)
Sodium: 138 mmol/L (ref 135–145)

## 2023-07-13 LAB — ECHOCARDIOGRAM COMPLETE
AR max vel: 2.3 cm2
AV Area VTI: 2.22 cm2
AV Area mean vel: 2.15 cm2
AV Mean grad: 2 mmHg
AV Peak grad: 3.9 mmHg
Ao pk vel: 0.98 m/s
Area-P 1/2: 3.17 cm2
Height: 73 in
S' Lateral: 3.6 cm
Weight: 3552 oz

## 2023-07-13 LAB — CBC
HCT: 32.5 % — ABNORMAL LOW (ref 39.0–52.0)
Hemoglobin: 10.6 g/dL — ABNORMAL LOW (ref 13.0–17.0)
MCH: 30.1 pg (ref 26.0–34.0)
MCHC: 32.6 g/dL (ref 30.0–36.0)
MCV: 92.3 fL (ref 80.0–100.0)
Platelets: 166 10*3/uL (ref 150–400)
RBC: 3.52 MIL/uL — ABNORMAL LOW (ref 4.22–5.81)
RDW: 14 % (ref 11.5–15.5)
WBC: 7.4 10*3/uL (ref 4.0–10.5)
nRBC: 0 % (ref 0.0–0.2)

## 2023-07-13 LAB — MAGNESIUM: Magnesium: 2.3 mg/dL (ref 1.7–2.4)

## 2023-07-13 LAB — TSH: TSH: 4.463 u[IU]/mL (ref 0.350–4.500)

## 2023-07-13 MED ORDER — AMIODARONE HCL 200 MG PO TABS
200.0000 mg | ORAL_TABLET | Freq: Two times a day (BID) | ORAL | Status: DC
  Administered 2023-07-13 – 2023-07-14 (×3): 200 mg via ORAL
  Filled 2023-07-13 (×3): qty 1

## 2023-07-13 MED ORDER — AMIODARONE HCL 200 MG PO TABS: 200.0000 mg | ORAL_TABLET | Freq: Every day | ORAL | Status: DC

## 2023-07-13 MED ORDER — METOPROLOL SUCCINATE ER 25 MG PO TB24
12.5000 mg | ORAL_TABLET | Freq: Every day | ORAL | Status: DC
  Administered 2023-07-13: 12.5 mg via ORAL
  Filled 2023-07-13: qty 1

## 2023-07-13 NOTE — Assessment & Plan Note (Deleted)
Watch closely with diuresis.  Creatinine did go up to 1.58 today and was 1.26 upon admission.

## 2023-07-13 NOTE — Progress Notes (Addendum)
       CROSS COVER NOTE  NAME: Robert Rivas MRN: [id number] DOB : 02/14/1941    Concern as stated by nurse / staff   Tele tech called to say this patient had a 15 beat run of vtach. Patient is asymptomatic. Vitals: 116/75 BP, 62 HR, 94% Spo2.   [place] He originally came in on 7/17 with Afib RVR. Was cardioverted and is now NSR/Sinus [place]. Supposed to DC tomorrow.      Pertinent findings on chart review: Today's progress note reviewed, cardiology consult note reviewed: Working diagnosis include A-fib with RVR and acute on chronic diastolic CHF and anasarca.  Currently on Lasix Patient was seen by cardiology  earlier today and Toprol was decreased from 25- to 12 point 5 at night due to baseline bradycardia.  Patient also on amiodarone     07/13/2023   11:26 PM 07/13/2023    7:27 PM 07/13/2023    5:28 PM  Vitals with BMI  Systolic 116 116 371  Diastolic 75 73 71  Pulse 52 59 55     Assessment and  Interventions   Assessment: Sustained ventricular tachycardia, asymptomatic Baseline bradycardia A-fib with RVR s/p successful DCCV on 7/17  Plan: Check potassium and magnesium and replete as appropriate Continue amiodarone and metoprolol Continue to monitor closely If recurrent or symptomatic, will give an additional Toprol 12.5   Potassium 3.7, mag still pendiing. Will replete if low

## 2023-07-13 NOTE — Hospital Course (Addendum)
82 y.o. male with medical history significant of paroxysmal atrial fibrillation, CAD, BPH, CKD, hypertension, hyperlipidemia.  Patient has been very fatigued for the last few days.  Very exhausted.  He had a little short of breath and a dry cough.  Came to the ER for further evaluation and found to be in rapid atrial fibrillation.  Patient was placed on Cardizem drip.  Patient was cardioverted by the ER team.  Heart rates than dropped into the 40s and Cardizem drip was discontinued.  Hospitalist services contacted for further evaluation.  Will admit as observation.  No complaints of chest pain.   7/18.  Patient feeling better not exhausted anymore.  Cardiology wants to diuresis with IV Lasix another day.  Cardiology restarted amiodarone and decrease dose of Toprol.   7/19.  Echocardiogram read as an EF of 45 to 50%.  Patient feeling better and wanted to go home.  Heart rate is bradycardic.  Cardiology appointment on amiodarone and Toprol to go home with.  With rising creatinine, with diuresis can consider Farxiga, Aldactone and/or ARB as outpatient.

## 2023-07-13 NOTE — Progress Notes (Signed)
[facility] CLINIC CARDIOLOGY CONSULT NOTE       Patient ID: Robert Rivas MRN: [id number] DOB/AGE: [birth year] 82 y.o.  Admit date: 07/12/2023 Referring Physician Dr. Reinaldo Meeker  Primary Physician Dr. Judithann Sheen  Primary Cardiologist previous Dr. Gwen Pounds  Reason for Consultation atrial flutter RVR   HPI: Robert Rivas is an 82yoM with a PMH of paroxysmal AF (ablation 2022), paroxysmal SVT (repeat RA ablation 2018, initial ablation 2002), CAD s/p CABG x4 (LIMA-LAD, SVT-D1, RIMA-OM1, SVG-OM2 1992) and hx multiple PCIs (most recently OM2 graft in 2002), HFpEF (50%, mod biatrial enlargement 01/2021) who presented to an acute visit the afternoon of 07/12/2023 with shortness of breath for 3-4 days with increased LE swelling, HR in clinic was elevated to 140, and the patient was referred immediately to [facility] ED for further management. EKG in the ED showed atrial flutter RVR. Cardiology is consulted for further assistance with his AFL.  S/p successful DCCV by EM physician with conversion to NSR on 7/17.   Interval History:  -Remains in sinus bradycardia/NSR rate in the mid 50s to 60s with PVCs -Feels much better with "more energy" -Diuresing well, peripheral edema improving but not quite at baseline. 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   L upper back 5.0cm inf to base of neck, 5.0cm lat to spine   Hyperlipidemia    Hypertension    MI, old    Nocturia     Phlebitis    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   SCC IS R foreearm distal   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   SCC IS R forearm proximal   Squamous cell carcinoma of skin 03/26/2018   SCC IS R forearm   Squamous cell carcinoma of skin 01/01/2008   SCC IS R forearm   Wears dentures    partial   Wears hearing aid in both ears     Past Surgical History:  Procedure Laterality Date   cardaic stents     CARDIAC CATHETERIZATION     PTCA with Stent Placement   CARDIOVERSION N/A 04/13/2021   Procedure: CARDIOVERSION;  Surgeon: Lamar Blinks, MD;  Location: [facility] ORS;  Service: Cardiovascular;  Laterality: N/A;   CARDIOVERSION N/A 05/05/2021   Procedure: CARDIOVERSION;  Surgeon: Lamar Blinks, MD;  Location: [facility] ORS;  Service: Cardiovascular;  Laterality: N/A;   CATARACT EXTRACTION W/PHACO Left 01/12/2023   Procedure: CATARACT EXTRACTION PHACO AND INTRAOCULAR LENS PLACEMENT (IOC) LEFT COMPLICATED MALYUGIN OMIDRIA   12.02  01:30.8;  Surgeon: Estanislado Pandy, MD;  Location: [facility];  Service: Ophthalmology;  Laterality: Left;   CATARACT EXTRACTION W/PHACO Right 01/26/2023   Procedure: CATARACT EXTRACTION PHACO AND INTRAOCULAR LENS PLACEMENT (IOC) RIGHT OMIDRIA  COMPLICATED  5.84  00:52.1;  Surgeon: Estanislado Pandy, MD;  Location: [facility];  Service: Ophthalmology;  Laterality: Right;   COLONOSCOPY WITH PROPOFOL N/A 12/15/2015   Procedure: COLONOSCOPY WITH PROPOFOL;  Surgeon: Christena Deem, MD;  Location: [facility] ENDOSCOPY;  Service: Endoscopy;  Laterality: N/A;   CORONARY ARTERY BYPASS GRAFT  1992   ECTROPION REPAIR Right 12/12/2017   Procedure: REPAIR OF ECTROPION SUTURES/EXTENSIVE RIGHT;  Surgeon: Imagene Riches, MD;  Location: [facility];  Service: Ophthalmology;  Laterality: Right;   HERNIA REPAIR     HIP ARTHROPLASTY Left 02/19/2022   Procedure: ARTHROPLASTY BIPOLAR HIP (HEMIARTHROPLASTY);  Surgeon: Juanell Fairly, MD;   Location: [facility] ORS;  Service: Orthopedics;  Laterality: Left;   IR KYPHO LUMBAR INC FX REDUCE BONE BX UNI/BIL CANNULATION INC/IMAGING  06/07/2022   IR RADIOLOGIST EVAL & MGMT  06/30/2022   SVT ABLATION  02/22/2017   [facility]    Medications Prior to Admission  Medication Sig Dispense Refill Last Dose   amLODipine (NORVASC) 5 MG tablet Take 5 mg by mouth daily.   07/12/2023   apixaban (ELIQUIS) 2.5 MG TABS tablet Take 2.5 mg by mouth 2 (two) times daily.   07/12/2023 at 0830   calcipotriene (DOVONOX) 0.005 % cream Apply topically 2 (two) times daily. 60 g 0 Past Week   Cyanocobalamin (VITAMIN B-12 IJ) Inject 1,000 mcg as directed every 30 (thirty) days.   Past Month   docusate sodium (COLACE) 100 MG capsule Take 100 mg by mouth in the morning.   07/12/2023   doxazosin (CARDURA) 2 MG tablet Take 2 mg by mouth daily.   07/11/2023   ezetimibe (ZETIA) 10 MG tablet Take 10 mg by mouth at bedtime.    07/11/2023   fluorouracil (EFUDEX) 5 % cream Apply topically 2 (two) times daily. 40 g 0 Past Week   furosemide (LASIX) 20 MG tablet Take 20 mg by mouth daily as needed.   unknown at prn   hydrALAZINE (APRESOLINE) 50 MG tablet Take 50 mg by mouth 2 (two) times daily.   07/12/2023   ketoconazole (NIZORAL) 2 % cream Apply 1 Application topically at bedtime. Qhs to feet 60 g 11 Past Week   montelukast (SINGULAIR) 10 MG tablet Take 10 mg by mouth at bedtime.   07/11/2023   nitroGLYCERIN (NITROSTAT) 0.4 MG SL tablet Place 0.4 mg under the tongue every 5 (five) minutes x 3 doses as needed for chest pain.   unknown at prn   olmesartan (BENICAR) 40 MG tablet Take 40 mg by mouth daily.   07/12/2023   probenecid (BENEMID) 500 MG tablet Take 500 mg by mouth daily.   07/12/2023   rOPINIRole (REQUIP) 1 MG tablet Take 1 mg by mouth at bedtime.   07/11/2023   sennosides-docusate sodium (SENOKOT-S) 8.6-50 MG tablet Take 2 tablets by mouth in the morning and at bedtime.   unknown at prn   amiodarone (PACERONE) 200 MG tablet Take 200 mg  by mouth daily. (Patient not taking: Reported on 07/12/2023)   Not Taking   SYRINGE-NEEDLE, DISP, 3 ML 25G X 1" 3 ML MISC Use 1 Syringe monthly.      traMADol (ULTRAM) 50 MG tablet Take 50 mg by mouth 3 (three) times daily as needed. (Patient not taking: Reported on 07/12/2023)   Not Taking   Social History   Socioeconomic History   Marital status: Married    Spouse name: Not on file   Number of children: Not on file   Years of education: Not on file   Highest education level: Not on file  Occupational  History   Not on file  Tobacco Use   Smoking status: Never   Smokeless tobacco: Never  Vaping Use   Vaping status: Never Used  Substance and Sexual Activity   Alcohol use: No   Drug use: No   Sexual activity: Not on file  Other Topics Concern   Not on file  Social History Narrative   Not on file   Social Determinants of Health   Financial Resource Strain: Not on file  Food Insecurity: No Food Insecurity (07/13/2023)   Hunger Vital Sign    Worried About Running Out of Food in the Last Year: Never true    Ran Out of Food in the Last Year: Never true  Transportation Needs: No Transportation Needs (07/13/2023)   PRAPARE - Administrator, Civil Service (Medical): No    Lack of Transportation (Non-Medical): No  Physical Activity: Not on file  Stress: Not on file  Social Connections: Not on file  Intimate Partner Violence: Not At Risk (07/13/2023)   Humiliation, Afraid, Rape, and Kick questionnaire    Fear of Current or Ex-Partner: No    Emotionally Abused: No    Physically Abused: No    Sexually Abused: No    Family History  Problem Relation Age of Onset   CAD Father    Kidney cancer Maternal Uncle    Prostate cancer Neg Hx    Bladder Cancer Neg Hx       Intake/Output Summary (Last 24 hours) at 07/13/2023 1354 Last data filed at 07/13/2023 1052 Gross per 24 hour  Intake 240 ml  Output 3105 ml  Net -2865 ml    Vitals:   07/13/23 0416 07/13/23 0706  07/13/23 0746 07/13/23 1321  BP: (!) 125/94  137/80 128/73  Pulse: 69  (!) 58 62  Resp: 17  14 16   Temp: 97.8 F (36.6 C)  98.4 F (36.9 C) 97.6 F (36.4 C)  TempSrc:      SpO2: 97%  93% 97%  Weight:  96.3 kg    Height:        PHYSICAL EXAM General: pleasant elderly caucasian male , well nourished, in no acute distress.  Seen standing in room after nursing staff obtained weight HEENT:  Normocephalic and atraumatic. Neck:  No JVD.  Lungs: Normal respiratory effort on room air.  Clear bilaterally to auscultation Heart: Regular rate and rhythm. Normal S1 and S2 without gallops or murmurs.  Abdomen: Non-distended appearing with excess adiposity.  Msk: Normal strength and tone for age. Extremities: Warm and well perfused. No clubbing, cyanosis. 2+ bilateral LE edema.  Neuro: Alert and oriented X 3. Psych:  Answers questions appropriately.   Labs: Basic Metabolic Panel: Recent Labs    07/12/23 1103 07/13/23 0249  NA 136 138  K 3.5 4.1  CL 106 104  CO2 22 24  GLUCOSE 111* 104*  BUN 19 23  CREATININE 1.26* 1.58*  CALCIUM 8.8* 8.6*  MG 2.5* 2.3   Liver Function Tests: Recent Labs    07/12/23 1103  AST 23  ALT 20  ALKPHOS 50  BILITOT 0.6  PROT 7.2  ALBUMIN 4.0   No results for input(s): "LIPASE", "AMYLASE" in the last 72 hours. CBC: Recent Labs    07/12/23 1103 07/13/23 0249  WBC 4.2 7.4  HGB 10.8* 10.6*  HCT 34.3* 32.5*  MCV 94.2 92.3  PLT 156 166   Cardiac Enzymes: Recent Labs    07/12/23 1103 07/12/23 1247  TROPONINIHS 27* 25*  BNP: Recent Labs    07/12/23 1103  BNP 319.3*   D-Dimer: No results for input(s): "DDIMER" in the last 72 hours. Hemoglobin A1C: No results for input(s): "HGBA1C" in the last 72 hours. Fasting Lipid Panel: No results for input(s): "CHOL", "HDL", "LDLCALC", "TRIG", "CHOLHDL", "LDLDIRECT" in the last 72 hours. Thyroid Function Tests: Recent Labs    07/13/23 0249  TSH 4.463   Anemia Panel: No results for  input(s): "VITAMINB12", "FOLATE", "FERRITIN", "TIBC", "IRON", "RETICCTPCT" in the last 72 hours.   Radiology: US Venous Img Lower Bilateral  Result Date: 07/12/2023 CLINICAL DATA:  Bilateral lower extremity swelling EXAM: BILATERAL LOWER EXTREMITY VENOUS DOPPLER ULTRASOUND TECHNIQUE: Gray-scale sonography with graded compression, as well as color Doppler and duplex ultrasound were performed to evaluate the lower extremity deep venous systems from the level of the common femoral vein and including the common femoral, femoral, profunda femoral, popliteal and calf veins including the posterior tibial, peroneal and gastrocnemius veins when visible. The superficial great saphenous vein was also interrogated. Spectral Doppler was utilized to evaluate flow at rest and with distal augmentation maneuvers in the common femoral, femoral and popliteal veins. COMPARISON:  None Available. FINDINGS: RIGHT LOWER EXTREMITY Common Femoral Vein: No evidence of thrombus. Normal compressibility, respiratory phasicity and response to augmentation. Saphenofemoral Junction: No evidence of thrombus. Normal compressibility and flow on color Doppler imaging. Profunda Femoral Vein: No evidence of thrombus. Normal compressibility and flow on color Doppler imaging. Femoral Vein: No evidence of thrombus. Normal compressibility, respiratory phasicity and response to augmentation. Popliteal Vein: No evidence of thrombus. Normal compressibility, respiratory phasicity and response to augmentation. Calf Veins: No evidence of thrombus. Normal compressibility and flow on color Doppler imaging. Superficial Great Saphenous Vein: No evidence of thrombus. Normal compressibility. Venous Reflux:  None. Other Findings:  Superficial subcutaneous edema LEFT LOWER EXTREMITY Common Femoral Vein: No evidence of thrombus. Normal compressibility, respiratory phasicity and response to augmentation. Saphenofemoral Junction: No evidence of thrombus. Normal  compressibility and flow on color Doppler imaging. Profunda Femoral Vein: No evidence of thrombus. Normal compressibility and flow on color Doppler imaging. Femoral Vein: No evidence of thrombus. Normal compressibility, respiratory phasicity and response to augmentation. Popliteal Vein: No evidence of thrombus. Normal compressibility, respiratory phasicity and response to augmentation. Calf Veins: No evidence of thrombus. Normal compressibility and flow on color Doppler imaging. Superficial Great Saphenous Vein: No evidence of thrombus. Normal compressibility. Venous Reflux:  None. Other Findings:  Superficial subcutaneous edema IMPRESSION: No evidence of deep venous thrombosis in either lower extremity. Electronically Signed   By: Malachy Moan M.D.   On: 07/12/2023 15:36   DG Chest 2 View  Result Date: 07/12/2023 CLINICAL DATA:  exertional dyspnea EXAM: CHEST - 2 VIEW COMPARISON:  CXR 02/14/22 FINDINGS: Status post median sternotomy and CABG. Cardiomegaly. Pleural effusion. No pneumothorax. There are hazy bibasilar airspace opacities, favored to represent atelectasis. No radiographically apparent displaced rib fractures. Visualized upper abdomen unremarkable. Redemonstrated compression deformity in the lower thoracic spine. IMPRESSION: 1.  Cardiomegaly 2.  Bibasilar atelectasis Electronically Signed   By: Lorenza Cambridge M.D.   On: 07/12/2023 11:58    ECHO 01/27/2021 DOPPLER ECHO and OTHER SPECIAL PROCEDURES                 Aortic: TRIVIAL AR                 No AS  103.8 cm/sec peak vel      4.3 mmHg peak grad                         2.3 mmHg mean grad         3.8 cm^2 by DOPPLER                 Mitral: MODERATE MR                No MS                         MV Inflow E Vel = 107.0 cm/sec      MV Annulus E'Vel = nm*                         E/E'Ratio = nm*              Tricuspid: MODERATE TR                No TS                         263.9 cm/sec peak TR vel   32.9 mmHg peak  RV pressure              Pulmonary: TRIVIAL PR                 No PS  _____________________________________________________________________ INTERPRETATION  NORMAL LEFT VENTRICULAR SYSTOLIC FUNCTION  NORMAL RIGHT VENTRICULAR SYSTOLIC FUNCTION  MODERATE VALVULAR REGURGITATION (See above)  NO VALVULAR STENOSIS  MODERATE BIATRIAL ENLARGEMENT  MILD RV ENLARGEMENT  MILD LVH  MILDLY DILATED AORTIC ROOT MEASURING UP TO 4.2 CM  MILDLY DILATED ASCENDING AORTA MEASURING UP TO 4.3 CM  _____________________________________________________________________ Electronically signed by      MD Arnoldo Hooker on 01/27/2021 04: 01 PM           Performed By: Luretha Murphy, RDCS, RVT     Ordering Physician: Ezekiel Ina   TELEMETRY reviewed by me (LT) 07/13/2023 : Sinus bradycardia to sinus rhythm rate mid 50s to 60s with multiple short atrial runs.  EKG reviewed by me: AFL 141  Data reviewed by me (LT) 07/13/2023: nursing notes, ED physician note, hospitalist H&P last 24h vitals tele labs imaging I/O    Principal Problem:   Atrial fibrillation with RVR (HCC) Active Problems:   Hyperlipidemia, unspecified   BPH (benign prostatic hyperplasia)   Acute on chronic diastolic CHF (congestive heart failure) (HCC)   History of CAD (coronary artery disease)   Anasarca    ASSESSMENT AND PLAN:  Robert Rivas is an 16yoM with a PMH of paroxysmal AF (ablation 2022), paroxysmal SVT (repeat RA ablation 2018, initial ablation 2002), CAD s/p CABG x4 (LIMA-LAD, SVT-D1, RIMA-OM1, SVG-OM2 1992) and hx multiple PCIs (most recently OM2 graft in 2002), HFpEF (50%, mod biatrial enlargement 01/2021) who presented to an acute visit the afternoon of 07/12/2023 with shortness of breath for 3-4 days with increased LE swelling, HR in clinic was elevated to 140, and the patient was referred immediately to [facility] ED for further management. EKG in the ED showed atrial flutter RVR. Cardiology is consulted for further assistance with his  AFL.   # paroxysmal AFL RVR # S/p successful DCCV 07/12/2023 Presents with 3 to 4 days of worsening dyspnea on exertion and associated bilateral lower extremity swelling, and atrial  flutter with RVR admission EKG, refractory to IV diltiazem. As below, clinically hypervolemic with pitting LE edema, likely volume overload provoking AFL RVR.  Fortunately remains in NSR today -Start p.o. amiodarone 200 mg twice a day for 14 days, then 200 mg daily thereafter starting 07/27/2023 - continue eliquis 5mg  BID for stroke prevention (meets this criteria with weight >60kg and Cr <1.5) -Decrease metoprolol XL from 25 mg at night to 12.5 mg at night with baseline bradycardia - monitor and replenish electrolytes for K>4 and mag>2  # acute on chronic HFpEF  Clinically hypervolemic with pitting edema bilaterally, bnp in the 300s. Not on a daily diuretic at home.,  Excellent diuresis so far. - repeat echo complete pending read. - continue diuresis with IV lasix 40mg  BID for now - will adjust GDMT as BP and renal function allow   # CAD s/p CABG, hx multiple prior PCIs # demand ischemia  Chest pain free, EKG nonischemic. Trop elevation most consistent with demand/supply mismatch and not ACS.  - continue home statin  - eliquis monotherapy   This patient's plan of care was discussed and created with Dr. Melton Alar and she is in agreement.  Signed: Rebeca Allegra , PA-C 07/13/2023, 1:54 PM [facility] Cardiology

## 2023-07-13 NOTE — Evaluation (Signed)
Physical Therapy Evaluation Patient Details Name: Robert Rivas MRN: [id number] DOB: [birth year] Today's Date: 07/13/2023  History of Present Illness  Robert Rivas is an 19yoM with a PMH of paroxysmal AF (ablation 2022), paroxysmal SVT (repeat RA ablation 2018, initial ablation 2002), CAD s/p CABG x4 (LIMA-LAD, SVT-D1, RIMA-OM1, SVG-OM2 1992) and hx multiple PCIs (most recently OM2 graft in 2002), HFpEF (50%, mod biatrial enlargement 01/2021) who presented to an acute visit the afternoon of 07/12/2023 with shortness of breath for 3-4 days with increased LE swelling, HR in clinic was elevated to 140, and the patient was referred immediately to [facility] ED for further management. EKG in the ED showed atrial flutter RVR. Cardiology is consulted for further assistance with his AFL.  S/p successful DCCV by EM physician with conversion to NSR on 7/17.    Clinical Impression  Pt received seated in recliner upon arrival to room and pt agreeable to therapy.  Pt performed STS however needed multiple attempts in order to fully stand.  Pt consistently having posterior bias during STS attempts, and when given verbal instruction to perform weight shift on toes/balls of feet, pt able to perform and stand without difficulty.  Pt then ambulated around the nursing station x2 with quad cane.  Pt does have difficulty with ambulation noting small steps and narrow BOS during ambulation.  Pt able to take larger steps when given verbal cues, however unable to maintain gait pattern.  Pt states his shortened strides have been his normal for a long time.  Therapist recommended pt to see OPPT once being discharged and pt agreeable in order to improve balance and strength of the LE's in order to return to PLOF.        Assistance Recommended at Discharge None  If plan is discharge home, recommend the following:  Can travel by private vehicle  A little help with walking and/or transfers;A little help with  bathing/dressing/bathroom        Equipment Recommendations None recommended by PT  Recommendations for Other Services       Functional Status Assessment Patient has had a recent decline in their functional status and demonstrates the ability to make significant improvements in function in a reasonable and predictable amount of time.     Precautions / Restrictions Precautions Precautions: None Restrictions Weight Bearing Restrictions: No      Mobility  Bed Mobility               General bed mobility comments: pt upright in recliner upon arrival    Transfers Overall transfer level: Needs assistance Equipment used: Quad cane Transfers: Sit to/from Stand Sit to Stand: Min guard           General transfer comment: Pt require multiple attempts, but eventuall was able to stand safely with use of the quad cane.    Ambulation/Gait Ambulation/Gait assistance: Min guard Gait Distance (Feet): 220 Feet Assistive device: Quad cane Gait Pattern/deviations: Step-to pattern, Decreased step length - right, Decreased stance time - right, Decreased stride length, Narrow base of support Gait velocity: decreased     General Gait Details: Pt with very slow and short steppage of gait.  Stairs            Wheelchair Mobility     Tilt Bed    Modified Rankin (Stroke Patients Only)       Balance Overall balance assessment: Needs assistance Sitting-balance support: No upper extremity supported, Feet supported Sitting balance-Leahy Scale: Good     Standing  balance support: Single extremity supported, During functional activity, Reliant on assistive device for balance Standing balance-Leahy Scale: Fair                               Pertinent Vitals/Pain Pain Assessment Pain Assessment: No/denies pain    Home Living Family/patient expects to be discharged to:: Private residence Living Arrangements: Spouse/significant other Available Help at  Discharge: Family;Available 24 hours/day Type of Home: Other(Comment) (Condo) Home Access: Level entry       Home Layout: One level Home Equipment: Agricultural consultant (2 wheels);Cane - quad;Grab bars - tub/shower;Hand held shower head      Prior Function Prior Level of Function : Independent/Modified Independent               ADLs Comments: Independent with ADLs. Drives, cooks, and goes to the gym regularly     Hand Dominance   Dominant Hand: Right    Extremity/Trunk Assessment   Upper Extremity Assessment Upper Extremity Assessment: Overall WFL for tasks assessed    Lower Extremity Assessment Lower Extremity Assessment: Overall WFL for tasks assessed;Generalized weakness       Communication   Communication: HOH  Cognition Arousal/Alertness: Awake/alert Behavior During Therapy: WFL for tasks assessed/performed Overall Cognitive Status: Within Functional Limits for tasks assessed                                          General Comments      Exercises     Assessment/Plan    PT Assessment Patient needs continued PT services  PT Problem List Decreased strength;Decreased activity tolerance;Decreased balance;Decreased mobility;Decreased safety awareness       PT Treatment Interventions DME instruction;Gait training;Functional mobility training;Therapeutic activities;Therapeutic exercise;Balance training;Neuromuscular re-education    PT Goals (Current goals can be found in the Care Plan section)  Acute Rehab PT Goals Patient Stated Goal: to get back home and get stronger. PT Goal Formulation: With patient Time For Goal Achievement: 07/27/23 Potential to Achieve Goals: Good    Frequency Min 2X/week     Co-evaluation               AM-PAC PT "6 Clicks" Mobility  Outcome Measure Help needed turning from your back to your side while in a flat bed without using bedrails?: A Little Help needed moving from lying on your back to sitting  on the side of a flat bed without using bedrails?: A Little Help needed moving to and from a bed to a chair (including a wheelchair)?: A Little Help needed standing up from a chair using your arms (e.g., wheelchair or bedside chair)?: A Little Help needed to walk in hospital room?: A Little Help needed climbing 3-5 steps with a railing? : A Lot 6 Click Score: 17    End of Session Equipment Utilized During Treatment: Gait belt Activity Tolerance: Patient tolerated treatment well Patient left: in chair;with call bell/phone within reach;with chair alarm set Nurse Communication: Mobility status PT Visit Diagnosis: Unsteadiness on feet (R26.81);Other abnormalities of gait and mobility (R26.89);Muscle weakness (generalized) (M62.81);History of falling (Z91.81);Difficulty in walking, not elsewhere classified (R26.2)    Time: 8295-6213 PT Time Calculation (min) (ACUTE ONLY): 26 min   Charges:     PT Treatments $Therapeutic Activity: 8-22 mins PT General Charges $$ ACUTE PT VISIT: 1 Visit  Nolon Bussing, PT, DPT Physical Therapist - [facility]  07/13/23, 3:18 PM

## 2023-07-13 NOTE — Progress Notes (Signed)
Progress Note   [name] AVW:[id number] DOB: [birth year] DOA: 07/12/2023     0 DOS: the patient was seen and examined on 07/13/2023   Brief hospital course: 82 y.o. male with medical history significant of paroxysmal atrial fibrillation, CAD, BPH, CKD, hypertension, hyperlipidemia.  Patient has been very fatigued for the last few days.  Very exhausted.  He had a little short of breath and a dry cough.  Came to the ER for further evaluation and found to be in rapid atrial fibrillation.  Patient was placed on Cardizem drip.  Patient was cardioverted by the ER team.  Heart rates than dropped into the 40s and Cardizem drip was discontinued.  Hospitalist services contacted for further evaluation.  Will admit as observation.  No complaints of chest pain.   7/18.  Patient feeling better not exhausted anymore.  Cardiology wants to diuresis with IV Lasix another day.  Cardiology restarted amiodarone and decrease dose of Toprol.  Echocardiogram still pending read.  Assessment and Plan: * Atrial fibrillation with RVR (HCC) Atrial fibrillation with rapid ventricular response.  He was initially placed on Cardizem drip.  Status post cardioversion in the emergency room. Cardizem drip was discontinued.  Cardiology restarted amiodarone this morning and decrease dose of Toprol-XL at night.  Acute on chronic diastolic CHF (congestive heart failure) (HCC) Continue diuresis with Lasix 40 mg IV twice daily.  Likely related to fast heart rate.  Patient on low-dose Toprol.  Echocardiogram pending read.  Anasarca Continue IV Lasix today  History of CAD (coronary artery disease) Continue Toprol.  Hyperlipidemia, unspecified Continue Zocor  BPH (benign prostatic hyperplasia) On Cardura  Normocytic anemia Last hemoglobin 10.6.  CKD stage 3a, GFR 45-59 ml/min (HCC) Watch closely with diuresis.  Creatinine did go up to 1.58 today and was 1.26 upon admission.        Subjective: Patient feeling  better today.  Not exhausted like he has been.  Able to ambulate with nursing staff.  Cardiology wants to diurese another day.  Admitted with atrial fibrillation rapid ventricular response status post cardioversion  Physical Exam: Vitals:   07/13/23 0416 07/13/23 0706 07/13/23 0746 07/13/23 1321  BP: (!) 125/94  137/80 128/73  Pulse: 69  (!) 58 62  Resp: 17  14 16   Temp: 97.8 F (36.6 C)  98.4 F (36.9 C) 97.6 F (36.4 C)  TempSrc:      SpO2: 97%  93% 97%  Weight:  96.3 kg    Height:       Physical Exam HENT:     Head: Normocephalic.     Mouth/Throat:     Pharynx: No oropharyngeal exudate.  Eyes:     General: Lids are normal.     Conjunctiva/sclera: Conjunctivae normal.  Cardiovascular:     Rate and Rhythm: Regular rhythm. Bradycardia present.     Heart sounds: Normal heart sounds, S1 normal and S2 normal.  Pulmonary:     Breath sounds: Examination of the right-lower field reveals decreased breath sounds. Examination of the left-lower field reveals decreased breath sounds. Decreased breath sounds present. No wheezing, rhonchi or rales.  Abdominal:     Palpations: Abdomen is soft.     Tenderness: There is no abdominal tenderness.  Musculoskeletal:     Right lower leg: Swelling present.     Left lower leg: Swelling present.  Skin:    General: Skin is warm.     Comments: Chronic lower extremity skin discoloration.  Neurological:     Mental  Status: He is alert and oriented to person, place, and time.     Data Reviewed: Echocardiogram pending Creatinine 1.58, hemoglobin 10.6  Family Communication: Updated patient's wife on the phone  Disposition: Status is: Observation Cardiology wants to diurese another day here in the hospital.  Planned Discharge Destination: Home    Time spent: 28 minutes  Author: Alford Highland, MD 07/13/2023 2:53 PM  For on call review [email or web address].

## 2023-07-13 NOTE — Assessment & Plan Note (Signed)
Last hemoglobin 10.6.

## 2023-07-13 NOTE — Progress Notes (Signed)
*  PRELIMINARY RESULTS* Echocardiogram 2D Echocardiogram has been performed.  Cristela Blue 07/13/2023, 11:34 AM

## 2023-07-13 NOTE — Plan of Care (Signed)

## 2023-07-14 ENCOUNTER — Other Ambulatory Visit (HOSPITAL_COMMUNITY): Payer: Self-pay

## 2023-07-14 DIAGNOSIS — I4891 Unspecified atrial fibrillation: Secondary | ICD-10-CM | POA: Diagnosis not present

## 2023-07-14 DIAGNOSIS — N179 Acute kidney failure, unspecified: Secondary | ICD-10-CM

## 2023-07-14 DIAGNOSIS — I5043 Acute on chronic combined systolic (congestive) and diastolic (congestive) heart failure: Secondary | ICD-10-CM

## 2023-07-14 DIAGNOSIS — N189 Chronic kidney disease, unspecified: Secondary | ICD-10-CM

## 2023-07-14 DIAGNOSIS — I480 Paroxysmal atrial fibrillation: Secondary | ICD-10-CM | POA: Diagnosis not present

## 2023-07-14 DIAGNOSIS — R601 Generalized edema: Secondary | ICD-10-CM | POA: Diagnosis not present

## 2023-07-14 LAB — BASIC METABOLIC PANEL
Anion gap: 10 (ref 5–15)
BUN: 28 mg/dL — ABNORMAL HIGH (ref 8–23)
CO2: 26 mmol/L (ref 22–32)
Calcium: 8.5 mg/dL — ABNORMAL LOW (ref 8.9–10.3)
Chloride: 104 mmol/L (ref 98–111)
Creatinine, Ser: 1.78 mg/dL — ABNORMAL HIGH (ref 0.61–1.24)
GFR, Estimated: 37 mL/min — ABNORMAL LOW (ref >60)
Glucose, Bld: 98 mg/dL (ref 70–99)
Potassium: 3.7 mmol/L (ref 3.5–5.1)
Sodium: 140 mmol/L (ref 135–145)

## 2023-07-14 LAB — MAGNESIUM: Magnesium: 2.2 mg/dL (ref 1.7–2.4)

## 2023-07-14 MED ORDER — AMIODARONE HCL 200 MG PO TABS: ORAL_TABLET | ORAL | 0 refills | Status: AC

## 2023-07-14 MED ORDER — POTASSIUM CHLORIDE CRYS ER 20 MEQ PO TBCR
EXTENDED_RELEASE_TABLET | ORAL | Status: AC
  Filled 2023-07-14: qty 2

## 2023-07-14 MED ORDER — TORSEMIDE 20 MG PO TABS: 20.0000 mg | ORAL_TABLET | Freq: Every day | ORAL | Status: DC

## 2023-07-14 MED ORDER — POTASSIUM CHLORIDE CRYS ER 20 MEQ PO TBCR: 20.0000 meq | EXTENDED_RELEASE_TABLET | Freq: Every day | ORAL | 0 refills | Status: AC

## 2023-07-14 MED ORDER — POTASSIUM CHLORIDE CRYS ER 20 MEQ PO TBCR: 20.0000 meq | EXTENDED_RELEASE_TABLET | Freq: Every day | ORAL | Status: DC

## 2023-07-14 MED ORDER — TORSEMIDE 20 MG PO TABS: 20.0000 mg | ORAL_TABLET | Freq: Every day | ORAL | 0 refills | Status: AC

## 2023-07-14 MED ORDER — POTASSIUM CHLORIDE CRYS ER 20 MEQ PO TBCR
40.0000 meq | EXTENDED_RELEASE_TABLET | Freq: Once | ORAL | Status: AC
  Administered 2023-07-14: 40 meq via ORAL

## 2023-07-14 MED ORDER — METOPROLOL SUCCINATE ER 25 MG PO TB24: 12.5000 mg | ORAL_TABLET | Freq: Every day | ORAL | 0 refills | Status: AC

## 2023-07-14 NOTE — Discharge Summary (Signed)
Physician Discharge Summary   [name] MRN: [id number] DOB: 04/24/1941  Admit date:     07/12/2023  Discharge date: 07/14/23  Discharge Physician: Alford Highland   PCP: Marguarite Arbour, MD   Recommendations at discharge:   Follow-up PCP 5 days Follow-up [facility] clinic cardiology 1 week Follow-up CHF clinic  Discharge Diagnoses: Principal Problem:   Atrial fibrillation with RVR (HCC) Active Problems:   Acute on chronic combined systolic and diastolic CHF (congestive heart failure) (HCC)   Anasarca   Acute kidney injury superimposed on CKD (HCC)   History of CAD (coronary artery disease)   Hyperlipidemia, unspecified   BPH (benign prostatic hyperplasia)   Normocytic anemia    Hospital Course: 82 y.o. male with medical history significant of paroxysmal atrial fibrillation, CAD, BPH, CKD, hypertension, hyperlipidemia.  Patient has been very fatigued for the last few days.  Very exhausted.  He had a little short of breath and a dry cough.  Came to the ER for further evaluation and found to be in rapid atrial fibrillation.  Patient was placed on Cardizem drip.  Patient was cardioverted by the ER team.  Heart rates than dropped into the 40s and Cardizem drip was discontinued.  Hospitalist services contacted for further evaluation.  Will admit as observation.  No complaints of chest pain.   7/18.  Patient feeling better not exhausted anymore.  Cardiology wants to diuresis with IV Lasix another day.  Cardiology restarted amiodarone and decrease dose of Toprol.   7/19.  Echocardiogram read as an EF of 45 to 50%.  Patient feeling better and wanted to go home.  Heart rate is bradycardic.  Cardiology appointment on amiodarone and Toprol to go home with.  With rising creatinine, with diuresis can consider Farxiga, Aldactone and/or ARB as outpatient.  Assessment and Plan: * Atrial fibrillation with RVR (HCC) Atrial fibrillation with rapid ventricular response.  He was initially  placed on Cardizem drip.  Status post cardioversion in the emergency room. Cardizem drip was discontinued.  Cardiology restarted amiodarone to 200 mg twice a day for 14 days then 200 mg daily after that.  Cardiology decreased dose of Toprol-XL.  Patient on Eliquis 2.5 mg twice a day.  If creatinine improves as outpatient consider increasing up to 5 mg twice a day.  Patient with heart rate in the 50s and 60s upon discharge.  Acute on chronic combined systolic and diastolic CHF (congestive heart failure) (HCC) EF 45 to 50%.  Patient was diuresed with IV Lasix 40 mg IV twice daily.  With rising creatinine held off on Aldactone, Farxiga and ARB at this point.  Continue low-dose Toprol-XL.  Switched over to torsemide 20 mg daily upon discharge.  Anasarca Patient was diuresed with Lasix 40 mg IV twice daily.  Patient's weight went down from 222 down to 204.7 pounds.  Patient's legs are still swollen.  Acute kidney injury superimposed on CKD (HCC) Creatinine 1.26 on presentation and increased to 1.78 with diuresis.  IV Lasix switched over to torsemide for tomorrow.  Recommend checking a BMP and follow-up appointment.  If creatinine improves consider increasing Eliquis to 5 mg twice daily as outpatient.  History of CAD (coronary artery disease) Continue Toprol.  Hyperlipidemia, unspecified Continue Zocor  BPH (benign prostatic hyperplasia) On Cardura  Normocytic anemia Last hemoglobin 10.6.         Consultants: Cardiology Procedures performed: None Disposition: Home Diet recommendation:  Cardiac diet DISCHARGE MEDICATION: Allergies as of 07/14/2023  Reactions   Codeine Nausea And Vomiting, Nausea Only   Tizanidine Other (See Comments)   drowsiness        Medication List     STOP taking these medications    amLODipine 5 MG tablet Commonly known as: NORVASC   furosemide 20 MG tablet Commonly known as: LASIX   hydrALAZINE 50 MG tablet Commonly known as: APRESOLINE    olmesartan 40 MG tablet Commonly known as: BENICAR   traMADol 50 MG tablet Commonly known as: ULTRAM       TAKE these medications    amiodarone 200 MG tablet Commonly known as: PACERONE 1 tab po twice a day for 14 days then one tab po daily after that What changed:  how much to take how to take this when to take this additional instructions   apixaban 2.5 MG Tabs tablet Commonly known as: ELIQUIS Take 2.5 mg by mouth 2 (two) times daily.   calcipotriene 0.005 % cream Commonly known as: DOVONOX Apply topically 2 (two) times daily.   docusate sodium 100 MG capsule Commonly known as: COLACE Take 100 mg by mouth in the morning.   doxazosin 2 MG tablet Commonly known as: CARDURA Take 2 mg by mouth daily.   ezetimibe 10 MG tablet Commonly known as: ZETIA Take 10 mg by mouth at bedtime.   fluorouracil 5 % cream Commonly known as: EFUDEX Apply topically 2 (two) times daily.   ketoconazole 2 % cream Commonly known as: NIZORAL Apply 1 Application topically at bedtime. Qhs to feet   metoprolol succinate 25 MG 24 hr tablet Commonly known as: TOPROL-XL Take 0.5 tablets (12.5 mg total) by mouth at bedtime.   montelukast 10 MG tablet Commonly known as: SINGULAIR Take 10 mg by mouth at bedtime.   nitroGLYCERIN 0.4 MG SL tablet Commonly known as: NITROSTAT Place 0.4 mg under the tongue every 5 (five) minutes x 3 doses as needed for chest pain.   potassium chloride SA 20 MEQ tablet Commonly known as: KLOR-CON M Take 1 tablet (20 mEq total) by mouth daily. Start taking on: July 15, 2023   probenecid 500 MG tablet Commonly known as: BENEMID Take 500 mg by mouth daily.   rOPINIRole 1 MG tablet Commonly known as: REQUIP Take 1 mg by mouth at bedtime.   sennosides-docusate sodium 8.6-50 MG tablet Commonly known as: SENOKOT-S Take 2 tablets by mouth in the morning and at bedtime.   SYRINGE-NEEDLE (DISP) 3 ML 25G X 1" 3 ML Misc Use 1 Syringe monthly.    torsemide 20 MG tablet Commonly known as: DEMADEX Take 1 tablet (20 mg total) by mouth daily. Start taking on: July 15, 2023   VITAMIN B-12 IJ Inject 1,000 mcg as directed every 30 (thirty) days.        Follow-up Information     Custovic, Rozell Searing, DO. Go in 1 week(s).   Specialty: Cardiology Why: [facility] Cardiology Contact information: [address] [place] [address] [phone]         Marguarite Arbour, MD Follow up in 5 day(s).   Specialty: Internal Medicine Contact information: [address] [place] [address] [phone]                Discharge Exam: Ceasar Mons Weights   07/12/23 1059 07/13/23 0706 07/14/23 0845  Weight: 100.7 kg 96.3 kg 92.9 kg   Physical Exam HENT:     Head: Normocephalic.     Mouth/Throat:     Pharynx: No oropharyngeal exudate.  Eyes:     General: Lids are normal.     Conjunctiva/sclera: Conjunctivae normal.  Cardiovascular:     Rate and Rhythm: Regular rhythm. Bradycardia present.     Heart sounds: Normal heart sounds, S1 normal and S2 normal.  Pulmonary:     Breath sounds: Examination of the right-lower field reveals decreased breath sounds. Examination of the left-lower field reveals decreased breath sounds. Decreased breath sounds present. No wheezing, rhonchi or rales.  Abdominal:     Palpations: Abdomen is soft.     Tenderness: There is no abdominal tenderness.  Musculoskeletal:     Right lower leg: Swelling present.     Left lower leg: Swelling present.  Skin:    General: Skin is warm.     Comments: Chronic lower extremity skin discoloration.  Neurological:     Mental Status: He is alert and oriented to person, place, and time.      Condition at discharge: stable  The results of significant diagnostics from this hospitalization (including imaging, microbiology, ancillary and laboratory) are listed below for reference.   Imaging Studies: ECHOCARDIOGRAM COMPLETE  Result Date: 07/13/2023     ECHOCARDIOGRAM REPORT   Patient Name:   ERVAN HEBER [facility] Date of Exam: 07/13/2023 Medical Rec #:  [id number]      Height:       73.0 in Accession #:    [id number]     Weight:       212.3 lb Date of Birth:  05/10/1941       BSA:          2.207 m Patient Age:    82 years       BP:           125/94 mmHg Patient Gender: M              HR:           69 bpm. Exam Location:  [facility] Procedure: 2D Echo, Cardiac Doppler and Color Doppler Indications:     Atrial Fibrillation I48.91  History:         Patient has no prior history of Echocardiogram examinations.                  Arrythmias:Atrial Fibrillation; Risk Factors:Hypertension. CKD.  Sonographer:     Cristela Blue Referring Phys:  [id number] Alford Highland Diagnosing Phys: Rozell Searing Custovic IMPRESSIONS  1. Left ventricular ejection fraction, by estimation, is 45 to 50%. The left ventricle has mildly decreased function. The left ventricle demonstrates global hypokinesis. Indeterminate diastolic filling due to E-A fusion.  2. Right ventricular systolic function is normal. The right ventricular size is normal.  3. Left atrial size was mildly dilated.  4. Right atrial size was severely dilated.  5. The mitral valve is grossly normal. Mild mitral valve regurgitation.  6. The aortic valve is calcified. Aortic valve regurgitation is not visualized. Aortic valve sclerosis/calcification is present, without any evidence of aortic stenosis. FINDINGS  Left Ventricle: Left ventricular ejection fraction, by estimation, is 45 to 50%. The left ventricle has mildly decreased function. The left ventricle demonstrates global hypokinesis. The left ventricular internal cavity size was normal in size. There is  no left ventricular hypertrophy. Indeterminate diastolic filling due to E-A fusion. Right Ventricle: The right ventricular size is normal. No increase in right ventricular wall thickness. Right ventricular systolic function is normal. Left Atrium: Left atrial size was mildly dilated. Right Atrium:  Right atrial size was severely dilated. Pericardium: There is no evidence  of pericardial effusion. Mitral Valve: The mitral valve is grossly normal. Mild mitral valve regurgitation. Tricuspid Valve: The tricuspid valve is grossly normal. Tricuspid valve regurgitation is trivial. Aortic Valve: The aortic valve is calcified. Aortic valve regurgitation is not visualized. Aortic valve sclerosis/calcification is present, without any evidence of aortic stenosis. Aortic valve mean gradient measures 2.0 mmHg. Aortic valve peak gradient measures 3.9 mmHg. Aortic valve area, by VTI measures 2.22 cm. Pulmonic Valve: The pulmonic valve was grossly normal. Pulmonic valve regurgitation is not visualized. Aorta: The aortic root and ascending aorta are structurally normal, with no evidence of dilitation. IAS/Shunts: No atrial level shunt detected by color flow Doppler.  LEFT VENTRICLE PLAX 2D LVIDd:         5.00 cm LVIDs:         3.60 cm LV PW:         1.40 cm LV IVS:        1.20 cm LVOT diam:     2.00 cm LV SV:         44 LV SV Index:   20 LVOT Area:     3.14 cm  RIGHT VENTRICLE RV Basal diam:  4.20 cm RV Mid diam:    2.80 cm RV S prime:     8.81 cm/s LEFT ATRIUM             Index        RIGHT ATRIUM           Index LA diam:        4.80 cm 2.17 cm/m   RA Area:     33.30 cm LA Vol (A2C):   65.9 ml 29.86 ml/m  RA Volume:   130.00 ml 58.91 ml/m LA Vol (A4C):   40.0 ml 18.12 ml/m LA Biplane Vol: 56.3 ml 25.51 ml/m  AORTIC VALVE AV Area (Vmax):    2.30 cm AV Area (Vmean):   2.15 cm AV Area (VTI):     2.22 cm AV Vmax:           98.30 cm/s AV Vmean:          69.900 cm/s AV VTI:            0.197 m AV Peak Grad:      3.9 mmHg AV Mean Grad:      2.0 mmHg LVOT Vmax:         71.90 cm/s LVOT Vmean:        47.900 cm/s LVOT VTI:          0.139 m LVOT/AV VTI ratio: 0.71  AORTA Ao Root diam: 4.30 cm MITRAL VALVE                TRICUSPID VALVE MV Area (PHT): 3.17 cm     TR Peak grad:   21.9 mmHg MV Decel Time: 239 msec     TR Vmax:         234.00 cm/s MV E velocity: 106.00 cm/s                             SHUNTS                             Systemic VTI:  0.14 m  Systemic Diam: 2.00 cm Clotilde Dieter Electronically signed by Clotilde Dieter Signature Date/Time: 07/13/2023/3:40:06 PM    Final    US Venous Img Lower Bilateral  Result Date: 07/12/2023 CLINICAL DATA:  Bilateral lower extremity swelling EXAM: BILATERAL LOWER EXTREMITY VENOUS DOPPLER ULTRASOUND TECHNIQUE: Gray-scale sonography with graded compression, as well as color Doppler and duplex ultrasound were performed to evaluate the lower extremity deep venous systems from the level of the common femoral vein and including the common femoral, femoral, profunda femoral, popliteal and calf veins including the posterior tibial, peroneal and gastrocnemius veins when visible. The superficial great saphenous vein was also interrogated. Spectral Doppler was utilized to evaluate flow at rest and with distal augmentation maneuvers in the common femoral, femoral and popliteal veins. COMPARISON:  None Available. FINDINGS: RIGHT LOWER EXTREMITY Common Femoral Vein: No evidence of thrombus. Normal compressibility, respiratory phasicity and response to augmentation. Saphenofemoral Junction: No evidence of thrombus. Normal compressibility and flow on color Doppler imaging. Profunda Femoral Vein: No evidence of thrombus. Normal compressibility and flow on color Doppler imaging. Femoral Vein: No evidence of thrombus. Normal compressibility, respiratory phasicity and response to augmentation. Popliteal Vein: No evidence of thrombus. Normal compressibility, respiratory phasicity and response to augmentation. Calf Veins: No evidence of thrombus. Normal compressibility and flow on color Doppler imaging. Superficial Great Saphenous Vein: No evidence of thrombus. Normal compressibility. Venous Reflux:  None. Other Findings:  Superficial subcutaneous edema LEFT LOWER EXTREMITY Common  Femoral Vein: No evidence of thrombus. Normal compressibility, respiratory phasicity and response to augmentation. Saphenofemoral Junction: No evidence of thrombus. Normal compressibility and flow on color Doppler imaging. Profunda Femoral Vein: No evidence of thrombus. Normal compressibility and flow on color Doppler imaging. Femoral Vein: No evidence of thrombus. Normal compressibility, respiratory phasicity and response to augmentation. Popliteal Vein: No evidence of thrombus. Normal compressibility, respiratory phasicity and response to augmentation. Calf Veins: No evidence of thrombus. Normal compressibility and flow on color Doppler imaging. Superficial Great Saphenous Vein: No evidence of thrombus. Normal compressibility. Venous Reflux:  None. Other Findings:  Superficial subcutaneous edema IMPRESSION: No evidence of deep venous thrombosis in either lower extremity. Electronically Signed   By: Malachy Moan M.D.   On: 07/12/2023 15:36   DG Chest 2 View  Result Date: 07/12/2023 CLINICAL DATA:  exertional dyspnea EXAM: CHEST - 2 VIEW COMPARISON:  CXR 02/14/22 FINDINGS: Status post median sternotomy and CABG. Cardiomegaly. Pleural effusion. No pneumothorax. There are hazy bibasilar airspace opacities, favored to represent atelectasis. No radiographically apparent displaced rib fractures. Visualized upper abdomen unremarkable. Redemonstrated compression deformity in the lower thoracic spine. IMPRESSION: 1.  Cardiomegaly 2.  Bibasilar atelectasis Electronically Signed   By: Lorenza Cambridge M.D.   On: 07/12/2023 11:58    Microbiology: Results for orders placed or performed during the hospital encounter of 02/14/22  Resp Panel by RT-PCR (Flu A&B, Covid) Nasopharyngeal Swab     Status: None   Collection Time: 02/14/22  7:40 PM   Specimen: Nasopharyngeal Swab; Nasopharyngeal(NP) swabs in vial transport medium  Result Value Ref Range Status   SARS Coronavirus 2 by RT PCR NEGATIVE NEGATIVE Final     Comment: (NOTE) SARS-CoV-2 target nucleic acids are NOT DETECTED.  The SARS-CoV-2 RNA is generally detectable in upper respiratory specimens during the acute phase of infection. The lowest concentration of SARS-CoV-2 viral copies this assay can detect is 138 copies/mL. A negative result does not preclude SARS-Cov-2 infection and should not be used as the sole basis for treatment or other patient management decisions.  A negative result may occur with  improper specimen collection/handling, submission of specimen other than nasopharyngeal swab, presence of viral mutation(s) within the areas targeted by this assay, and inadequate number of viral copies(<138 copies/mL). A negative result must be combined with clinical observations, patient history, and epidemiological information. The expected result is Negative.  Fact Sheet for Patients:  BloggerCourse.com  Fact Sheet for Healthcare Providers:  SeriousBroker.it  This test is no t yet approved or cleared by the [place] FDA and  has been authorized for detection and/or diagnosis of SARS-CoV-2 by FDA under an Emergency Use Authorization (EUA). This EUA will remain  in effect (meaning this test can be used) for the duration of the COVID-19 declaration under Section 564(b)(1) of the Act, 21 U.S.C.section 360bbb-3(b)(1), unless the authorization is terminated  or revoked sooner.       Influenza A by PCR NEGATIVE NEGATIVE Final   Influenza B by PCR NEGATIVE NEGATIVE Final    Comment: (NOTE) The Xpert Xpress SARS-CoV-2/FLU/RSV plus assay is intended as an aid in the diagnosis of influenza from Nasopharyngeal swab specimens and should not be used as a sole basis for treatment. Nasal washings and aspirates are unacceptable for Xpert Xpress SARS-CoV-2/FLU/RSV testing.  Fact Sheet for Patients: BloggerCourse.com  Fact Sheet for Healthcare  Providers: SeriousBroker.it  This test is not yet approved or cleared by the [place] FDA and has been authorized for detection and/or diagnosis of SARS-CoV-2 by FDA under an Emergency Use Authorization (EUA). This EUA will remain in effect (meaning this test can be used) for the duration of the COVID-19 declaration under Section 564(b)(1) of the Act, 21 U.S.C. section 360bbb-3(b)(1), unless the authorization is terminated or revoked.  Performed at [facility], [address], [place], [place] [address]   Surgical PCR screen     Status: Abnormal   Collection Time: 02/14/22 10:51 PM   Specimen: Nasal Mucosa; Nasal Swab  Result Value Ref Range Status   MRSA, PCR POSITIVE (A) NEGATIVE Final    Comment: RESULT CALLED TO, READ BACK BY AND VERIFIED WITH: Nolberto Hanlon [email or web address]  02/15/22 RH    Staphylococcus aureus POSITIVE (A) NEGATIVE Final    Comment: (NOTE) The Xpert SA Assay (FDA approved for NASAL specimens in patients 12 years of age and older), is one component of a comprehensive surveillance program. It is not intended to diagnose infection nor to guide or monitor treatment. Performed at [facility], [address] [place], [place] [address]     Labs: CBC: Recent Labs  Lab 07/12/23 1103 07/13/23 0249  WBC 4.2 7.4  HGB 10.8* 10.6*  HCT 34.3* 32.5*  MCV 94.2 92.3  PLT 156 166   Basic Metabolic Panel: Recent Labs  Lab 07/12/23 1103 07/13/23 0249 07/14/23 0011  NA 136 138 140  K 3.5 4.1 3.7  CL 106 104 104  CO2 22 24 26   GLUCOSE 111* 104* 98  BUN 19 23 28*  CREATININE 1.26* 1.58* 1.78*  CALCIUM 8.8* 8.6* 8.5*  MG 2.5* 2.3 2.2   Liver Function Tests: Recent Labs  Lab 07/12/23 1103  AST 23  ALT 20  ALKPHOS 50  BILITOT 0.6  PROT 7.2  ALBUMIN 4.0     Discharge time spent: greater than 30 minutes.  Signed: Alford Highland, MD [facility] 07/14/2023

## 2023-07-14 NOTE — Progress Notes (Signed)
Transition of Care [facility]) - Inpatient Brief Assessment   Patient Details  Name: Robert Rivas MRN: [id number] Date of Birth: 06-Nov-1941  Transition of Care [facility]) CM/SW Contact:    Truddie Hidden, RN Phone Number: 07/14/2023, 12:29 PM   Clinical Narrative: No needs assessed. Patient discharge home. TOC signing off.   Transition of Care Asessment: Insurance and Status: Insurance coverage has been reviewed Patient has primary care physician: Yes Home environment has been reviewed: Return to home Prior level of function:: independent Prior/Current Home Services: No current home services Social Determinants of Health Reivew: SDOH reviewed no interventions necessary Readmission risk has been reviewed: Yes Transition of care needs: no transition of care needs at this time

## 2023-07-14 NOTE — Discharge Instructions (Signed)
Recommend checking BMP in follow up appointment ( and adjust eliquis dose if needed)  Weigh yourself daily.  If you gain 3 lbs in one day or 5 lbs in a week, call your doctor.

## 2023-07-14 NOTE — Assessment & Plan Note (Signed)
EF 45 to 50%.  Patient was diuresed with IV Lasix 40 mg IV twice daily.  With rising creatinine held off on Aldactone, Farxiga and ARB at this point.  Continue low-dose Toprol-XL.  Switched over to torsemide 20 mg daily upon discharge.

## 2023-07-14 NOTE — Assessment & Plan Note (Signed)
Creatinine 1.26 on presentation and increased to 1.78 with diuresis.  IV Lasix switched over to torsemide for tomorrow.  Recommend checking a BMP and follow-up appointment.  If creatinine improves consider increasing Eliquis to 5 mg twice daily as outpatient.

## 2023-07-14 NOTE — Progress Notes (Signed)
Patient discharging home, all DC paperwork reviewed and sent with pt and spouse Robert Rivas. Pt leaving with all personal belongings with spouse via private vehicle.

## 2023-07-14 NOTE — Progress Notes (Signed)
[facility] CLINIC CARDIOLOGY CONSULT NOTE       Patient ID: Robert Rivas MRN: [id number] DOB/AGE: November 21, 1941 82 y.o.  Admit date: 07/12/2023 Referring Physician Dr. Reinaldo Meeker  Primary Physician Dr. Judithann Sheen  Primary Cardiologist previous Dr. Gwen Pounds  Reason for Consultation atrial flutter RVR   HPI: Robert Rivas is an 15yoM with a PMH of paroxysmal AF (ablation 2022), paroxysmal SVT (repeat RA ablation 2018, initial ablation 2002), CAD s/p CABG x4 (LIMA-LAD, SVT-D1, RIMA-OM1, SVG-OM2 1992) and hx multiple PCIs (most recently OM2 graft in 2002), HFpEF (50%, mod biatrial enlargement 01/2021) who presented to an acute visit the afternoon of 07/12/2023 with shortness of breath for 3-4 days with increased LE swelling, HR in clinic was elevated to 140, and the patient was referred immediately to [facility] ED for further management. EKG in the ED showed atrial flutter RVR. Cardiology is consulted for further assistance with his AFL.  S/p successful DCCV by EM physician with conversion to NSR on 7/17.   Interval History:  -continues to feel improved, energy better - no chest pain, dyspnea, palpitaitons - in sinus brady to NSR with rates mid 40s-50s on tele, no dizziness, lightheadess. 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   L upper back 5.0cm inf to base of neck, 5.0cm lat to spine   Hyperlipidemia    Hypertension    MI, old    Nocturia     Phlebitis    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   SCC IS R foreearm distal   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   SCC IS R forearm proximal   Squamous cell carcinoma of skin 03/26/2018   SCC IS R forearm   Squamous cell carcinoma of skin 01/01/2008   SCC IS R forearm   Wears dentures    partial   Wears hearing aid in both ears     Past Surgical History:  Procedure Laterality Date   cardaic stents     CARDIAC CATHETERIZATION     PTCA with Stent Placement   CARDIOVERSION N/A 04/13/2021   Procedure: CARDIOVERSION;  Surgeon: Lamar Blinks, MD;  Location: [facility] ORS;  Service: Cardiovascular;  Laterality: N/A;   CARDIOVERSION N/A 05/05/2021   Procedure: CARDIOVERSION;  Surgeon: Lamar Blinks, MD;  Location: [facility] ORS;  Service: Cardiovascular;  Laterality: N/A;   CATARACT EXTRACTION W/PHACO Left 01/12/2023   Procedure: CATARACT EXTRACTION PHACO AND INTRAOCULAR LENS PLACEMENT (IOC) LEFT COMPLICATED MALYUGIN OMIDRIA   12.02  01:30.8;  Surgeon: Estanislado Pandy, MD;  Location: [facility];  Service: Ophthalmology;  Laterality: Left;   CATARACT EXTRACTION W/PHACO Right 01/26/2023   Procedure: CATARACT EXTRACTION PHACO AND INTRAOCULAR LENS PLACEMENT (IOC) RIGHT OMIDRIA  COMPLICATED  5.84  00:52.1;  Surgeon: Estanislado Pandy, MD;  Location: [facility] SURGERY  CNTR;  Service: Ophthalmology;  Laterality: Right;   COLONOSCOPY WITH PROPOFOL N/A 12/15/2015   Procedure: COLONOSCOPY WITH PROPOFOL;  Surgeon: Christena Deem, MD;  Location: [facility] ENDOSCOPY;  Service: Endoscopy;  Laterality: N/A;   CORONARY ARTERY BYPASS GRAFT  1992   ECTROPION REPAIR Right 12/12/2017   Procedure: REPAIR OF ECTROPION SUTURES/EXTENSIVE RIGHT;  Surgeon: Imagene Riches, MD;  Location: [facility] SURGERY [facility]TR;  Service: Ophthalmology;  Laterality: Right;   HERNIA REPAIR     HIP ARTHROPLASTY Left 02/19/2022   Procedure: ARTHROPLASTY BIPOLAR HIP (HEMIARTHROPLASTY);  Surgeon: Juanell Fairly, MD;   Location: [facility] ORS;  Service: Orthopedics;  Laterality: Left;   IR KYPHO LUMBAR INC FX REDUCE BONE BX UNI/BIL CANNULATION INC/IMAGING  06/07/2022   IR RADIOLOGIST EVAL & MGMT  06/30/2022   SVT ABLATION  02/22/2017   [facility]    Medications Prior to Admission  Medication Sig Dispense Refill Last Dose   amLODipine (NORVASC) 5 MG tablet Take 5 mg by mouth daily.   07/12/2023   apixaban (ELIQUIS) 2.5 MG TABS tablet Take 2.5 mg by mouth 2 (two) times daily.   07/12/2023 at 0830   calcipotriene (DOVONOX) 0.005 % cream Apply topically 2 (two) times daily. 60 g 0 Past Week   Cyanocobalamin (VITAMIN B-12 IJ) Inject 1,000 mcg as directed every 30 (thirty) days.   Past Month   docusate sodium (COLACE) 100 MG capsule Take 100 mg by mouth in the morning.   07/12/2023   doxazosin (CARDURA) 2 MG tablet Take 2 mg by mouth daily.   07/11/2023   ezetimibe (ZETIA) 10 MG tablet Take 10 mg by mouth at bedtime.    07/11/2023   fluorouracil (EFUDEX) 5 % cream Apply topically 2 (two) times daily. 40 g 0 Past Week   furosemide (LASIX) 20 MG tablet Take 20 mg by mouth daily as needed.   unknown at prn   hydrALAZINE (APRESOLINE) 50 MG tablet Take 50 mg by mouth 2 (two) times daily.   07/12/2023   ketoconazole (NIZORAL) 2 % cream Apply 1 Application topically at bedtime. Qhs to feet 60 g 11 Past Week   montelukast (SINGULAIR) 10 MG tablet Take 10 mg by mouth at bedtime.   07/11/2023   nitroGLYCERIN (NITROSTAT) 0.4 MG SL tablet Place 0.4 mg under the tongue every 5 (five) minutes x 3 doses as needed for chest pain.   unknown at prn   olmesartan (BENICAR) 40 MG tablet Take 40 mg by mouth daily.   07/12/2023   probenecid (BENEMID) 500 MG tablet Take 500 mg by mouth daily.   07/12/2023   rOPINIRole (REQUIP) 1 MG tablet Take 1 mg by mouth at bedtime.   07/11/2023   sennosides-docusate sodium (SENOKOT-S) 8.6-50 MG tablet Take 2 tablets by mouth in the morning and at bedtime.   unknown at prn   SYRINGE-NEEDLE, DISP, 3 ML 25G X 1" 3 ML MISC  Use 1 Syringe monthly.      traMADol (ULTRAM) 50 MG tablet Take 50 mg by mouth 3 (three) times daily as needed. (Patient not taking: Reported on 07/12/2023)   Not Taking   [DISCONTINUED] amiodarone (PACERONE) 200 MG tablet Take 200 mg by mouth daily. (Patient not taking: Reported on 07/12/2023)   Not Taking   Social History   Socioeconomic History   Marital status: Married    Spouse name: Not on file   Number of children: Not on file   Years of education: Not on file   Highest education level: Not on  file  Occupational History   Not on file  Tobacco Use   Smoking status: Never   Smokeless tobacco: Never  Vaping Use   Vaping status: Never Used  Substance and Sexual Activity   Alcohol use: No   Drug use: No   Sexual activity: Not on file  Other Topics Concern   Not on file  Social History Narrative   Not on file   Social Determinants of Health   Financial Resource Strain: Not on file  Food Insecurity: No Food Insecurity (07/13/2023)   Hunger Vital Sign    Worried About Running Out of Food in the Last Year: Never true    Ran Out of Food in the Last Year: Never true  Transportation Needs: No Transportation Needs (07/13/2023)   PRAPARE - Administrator, Civil Service (Medical): No    Lack of Transportation (Non-Medical): No  Physical Activity: Not on file  Stress: Not on file  Social Connections: Not on file  Intimate Partner Violence: Not At Risk (07/13/2023)   Humiliation, Afraid, Rape, and Kick questionnaire    Fear of Current or Ex-Partner: No    Emotionally Abused: No    Physically Abused: No    Sexually Abused: No    Family History  Problem Relation Age of Onset   CAD Father    Kidney cancer Maternal Uncle    Prostate cancer Neg Hx    Bladder Cancer Neg Hx       Intake/Output Summary (Last 24 hours) at 07/14/2023 1120 Last data filed at 07/14/2023 1115 Gross per 24 hour  Intake 440 ml  Output 1075 ml  Net -635 ml    Vitals:   07/13/23 2326  07/14/23 0415 07/14/23 0807 07/14/23 0845  BP: 116/75 129/68 124/71   Pulse: (!) 52 60 (!) 49   Resp: 18 18 16    Temp: 98.1 F (36.7 C) 97.8 F (36.6 C) 97.7 F (36.5 C)   TempSrc:  Axillary Oral   SpO2: 94% 93% 96%   Weight:    92.9 kg  Height:        PHYSICAL EXAM General: pleasant elderly caucasian male , well nourished, in no acute distress.  Sittin gin recliner HEENT:  Normocephalic and atraumatic. Neck:  No JVD.  Lungs: Normal respiratory effort on room air.  Clear bilaterally to auscultation Heart: Regular rate and rhythm. Normal S1 and S2 without gallops or murmurs.  Abdomen: Non-distended appearing with excess adiposity.  Msk: Normal strength and tone for age. Extremities: Warm and well perfused. No clubbing, cyanosis. Trace to1+ bilateral LE edema. Ted hose Neuro: Alert and oriented X 3. Psych:  Answers questions appropriately.   Labs: Basic Metabolic Panel: Recent Labs    07/13/23 0249 07/14/23 0011  NA 138 140  K 4.1 3.7  CL 104 104  CO2 24 26  GLUCOSE 104* 98  BUN 23 28*  CREATININE 1.58* 1.78*  CALCIUM 8.6* 8.5*  MG 2.3 2.2   Liver Function Tests: Recent Labs    07/12/23 1103  AST 23  ALT 20  ALKPHOS 50  BILITOT 0.6  PROT 7.2  ALBUMIN 4.0   No results for input(s): "LIPASE", "AMYLASE" in the last 72 hours. CBC: Recent Labs    07/12/23 1103 07/13/23 0249  WBC 4.2 7.4  HGB 10.8* 10.6*  HCT 34.3* 32.5*  MCV 94.2 92.3  PLT 156 166   Cardiac Enzymes: Recent Labs    07/12/23 1103 07/12/23 1247  TROPONINIHS 27* 25*  BNP: Recent Labs    07/12/23 1103  BNP 319.3*   D-Dimer: No results for input(s): "DDIMER" in the last 72 hours. Hemoglobin A1C: No results for input(s): "HGBA1C" in the last 72 hours. Fasting Lipid Panel: No results for input(s): "CHOL", "HDL", "LDLCALC", "TRIG", "CHOLHDL", "LDLDIRECT" in the last 72 hours. Thyroid Function Tests: Recent Labs    07/13/23 0249  TSH 4.463   Anemia Panel: No results for  input(s): "VITAMINB12", "FOLATE", "FERRITIN", "TIBC", "IRON", "RETICCTPCT" in the last 72 hours.   Radiology: ECHOCARDIOGRAM COMPLETE  Result Date: 07/13/2023    ECHOCARDIOGRAM REPORT   Patient Name:   Robert Rivas [facility] Date of Exam: 07/13/2023 Medical Rec #:  [id number]      Height:       73.0 in Accession #:    [id number]     Weight:       212.3 lb Date of Birth:  07-May-1941       BSA:          2.207 m Patient Age:    82 years       BP:           125/94 mmHg Patient Gender: M              HR:           69 bpm. Exam Location:  [facility] Procedure: 2D Echo, Cardiac Doppler and Color Doppler Indications:     Atrial Fibrillation I48.91  History:         Patient has no prior history of Echocardiogram examinations.                  Arrythmias:Atrial Fibrillation; Risk Factors:Hypertension. CKD.  Sonographer:     Cristela Blue Referring Phys:  [id number] Alford Highland Diagnosing Phys: Rozell Searing Custovic IMPRESSIONS  1. Left ventricular ejection fraction, by estimation, is 45 to 50%. The left ventricle has mildly decreased function. The left ventricle demonstrates global hypokinesis. Indeterminate diastolic filling due to E-A fusion.  2. Right ventricular systolic function is normal. The right ventricular size is normal.  3. Left atrial size was mildly dilated.  4. Right atrial size was severely dilated.  5. The mitral valve is grossly normal. Mild mitral valve regurgitation.  6. The aortic valve is calcified. Aortic valve regurgitation is not visualized. Aortic valve sclerosis/calcification is present, without any evidence of aortic stenosis. FINDINGS  Left Ventricle: Left ventricular ejection fraction, by estimation, is 45 to 50%. The left ventricle has mildly decreased function. The left ventricle demonstrates global hypokinesis. The left ventricular internal cavity size was normal in size. There is  no left ventricular hypertrophy. Indeterminate diastolic filling due to E-A fusion. Right Ventricle: The right ventricular size is  normal. No increase in right ventricular wall thickness. Right ventricular systolic function is normal. Left Atrium: Left atrial size was mildly dilated. Right Atrium: Right atrial size was severely dilated. Pericardium: There is no evidence of pericardial effusion. Mitral Valve: The mitral valve is grossly normal. Mild mitral valve regurgitation. Tricuspid Valve: The tricuspid valve is grossly normal. Tricuspid valve regurgitation is trivial. Aortic Valve: The aortic valve is calcified. Aortic valve regurgitation is not visualized. Aortic valve sclerosis/calcification is present, without any evidence of aortic stenosis. Aortic valve mean gradient measures 2.0 mmHg. Aortic valve peak gradient measures 3.9 mmHg. Aortic valve area, by VTI measures 2.22 cm. Pulmonic Valve: The pulmonic valve was grossly normal. Pulmonic valve regurgitation is not visualized. Aorta: The aortic root and ascending aorta are structurally normal, with  no evidence of dilitation. IAS/Shunts: No atrial level shunt detected by color flow Doppler.  LEFT VENTRICLE PLAX 2D LVIDd:         5.00 cm LVIDs:         3.60 cm LV PW:         1.40 cm LV IVS:        1.20 cm LVOT diam:     2.00 cm LV SV:         44 LV SV Index:   20 LVOT Area:     3.14 cm  RIGHT VENTRICLE RV Basal diam:  4.20 cm RV Mid diam:    2.80 cm RV S prime:     8.81 cm/s LEFT ATRIUM             Index        RIGHT ATRIUM           Index LA diam:        4.80 cm 2.17 cm/m   RA Area:     33.30 cm LA Vol (A2C):   65.9 ml 29.86 ml/m  RA Volume:   130.00 ml 58.91 ml/m LA Vol (A4C):   40.0 ml 18.12 ml/m LA Biplane Vol: 56.3 ml 25.51 ml/m  AORTIC VALVE AV Area (Vmax):    2.30 cm AV Area (Vmean):   2.15 cm AV Area (VTI):     2.22 cm AV Vmax:           98.30 cm/s AV Vmean:          69.900 cm/s AV VTI:            0.197 m AV Peak Grad:      3.9 mmHg AV Mean Grad:      2.0 mmHg LVOT Vmax:         71.90 cm/s LVOT Vmean:        47.900 cm/s LVOT VTI:          0.139 m LVOT/AV VTI ratio: 0.71   AORTA Ao Root diam: 4.30 cm MITRAL VALVE                TRICUSPID VALVE MV Area (PHT): 3.17 cm     TR Peak grad:   21.9 mmHg MV Decel Time: 239 msec     TR Vmax:        234.00 cm/s MV E velocity: 106.00 cm/s                             SHUNTS                             Systemic VTI:  0.14 m                             Systemic Diam: 2.00 cm Sabina Custovic Electronically signed by Clotilde Dieter Signature Date/Time: 07/13/2023/3:40:06 PM    Final    US Venous Img Lower Bilateral  Result Date: 07/12/2023 CLINICAL DATA:  Bilateral lower extremity swelling EXAM: BILATERAL LOWER EXTREMITY VENOUS DOPPLER ULTRASOUND TECHNIQUE: Gray-scale sonography with graded compression, as well as color Doppler and duplex ultrasound were performed to evaluate the lower extremity deep venous systems from the level of the common femoral vein and including the common femoral, femoral, profunda femoral, popliteal and calf veins including the posterior tibial, peroneal and gastrocnemius veins when  visible. The superficial great saphenous vein was also interrogated. Spectral Doppler was utilized to evaluate flow at rest and with distal augmentation maneuvers in the common femoral, femoral and popliteal veins. COMPARISON:  None Available. FINDINGS: RIGHT LOWER EXTREMITY Common Femoral Vein: No evidence of thrombus. Normal compressibility, respiratory phasicity and response to augmentation. Saphenofemoral Junction: No evidence of thrombus. Normal compressibility and flow on color Doppler imaging. Profunda Femoral Vein: No evidence of thrombus. Normal compressibility and flow on color Doppler imaging. Femoral Vein: No evidence of thrombus. Normal compressibility, respiratory phasicity and response to augmentation. Popliteal Vein: No evidence of thrombus. Normal compressibility, respiratory phasicity and response to augmentation. Calf Veins: No evidence of thrombus. Normal compressibility and flow on color Doppler imaging. Superficial  Great Saphenous Vein: No evidence of thrombus. Normal compressibility. Venous Reflux:  None. Other Findings:  Superficial subcutaneous edema LEFT LOWER EXTREMITY Common Femoral Vein: No evidence of thrombus. Normal compressibility, respiratory phasicity and response to augmentation. Saphenofemoral Junction: No evidence of thrombus. Normal compressibility and flow on color Doppler imaging. Profunda Femoral Vein: No evidence of thrombus. Normal compressibility and flow on color Doppler imaging. Femoral Vein: No evidence of thrombus. Normal compressibility, respiratory phasicity and response to augmentation. Popliteal Vein: No evidence of thrombus. Normal compressibility, respiratory phasicity and response to augmentation. Calf Veins: No evidence of thrombus. Normal compressibility and flow on color Doppler imaging. Superficial Great Saphenous Vein: No evidence of thrombus. Normal compressibility. Venous Reflux:  None. Other Findings:  Superficial subcutaneous edema IMPRESSION: No evidence of deep venous thrombosis in either lower extremity. Electronically Signed   By: Malachy Moan M.D.   On: 07/12/2023 15:36   DG Chest 2 View  Result Date: 07/12/2023 CLINICAL DATA:  exertional dyspnea EXAM: CHEST - 2 VIEW COMPARISON:  CXR 02/14/22 FINDINGS: Status post median sternotomy and CABG. Cardiomegaly. Pleural effusion. No pneumothorax. There are hazy bibasilar airspace opacities, favored to represent atelectasis. No radiographically apparent displaced rib fractures. Visualized upper abdomen unremarkable. Redemonstrated compression deformity in the lower thoracic spine. IMPRESSION: 1.  Cardiomegaly 2.  Bibasilar atelectasis Electronically Signed   By: Lorenza Cambridge M.D.   On: 07/12/2023 11:58    ECHO 01/27/2021 DOPPLER ECHO and OTHER SPECIAL PROCEDURES                 Aortic: TRIVIAL AR                 No AS                         103.8 cm/sec peak vel      4.3 mmHg peak grad                         2.3 mmHg mean  grad         3.8 cm^2 by DOPPLER                 Mitral: MODERATE MR                No MS                         MV Inflow E Vel = 107.0 cm/sec      MV Annulus E'Vel = nm*                         E/E'Ratio = nm*  Tricuspid: MODERATE TR                No TS                         263.9 cm/sec peak TR vel   32.9 mmHg peak RV pressure              Pulmonary: TRIVIAL PR                 No PS  _____________________________________________________________________ INTERPRETATION  NORMAL LEFT VENTRICULAR SYSTOLIC FUNCTION  NORMAL RIGHT VENTRICULAR SYSTOLIC FUNCTION  MODERATE VALVULAR REGURGITATION (See above)  NO VALVULAR STENOSIS  MODERATE BIATRIAL ENLARGEMENT  MILD RV ENLARGEMENT  MILD LVH  MILDLY DILATED AORTIC ROOT MEASURING UP TO 4.2 CM  MILDLY DILATED ASCENDING AORTA MEASURING UP TO 4.3 CM  _____________________________________________________________________ Electronically signed by      MD Arnoldo Hooker on 01/27/2021 04: 01 PM           Performed By: Luretha Murphy, RDCS, RVT     Ordering Physician: Ezekiel Ina   TELEMETRY reviewed by me (LT) 07/14/2023 : Sinus bradycardia to sinus rhythm rate mid 50s to 60s with multiple short atrial runs.  EKG reviewed by me: AFL 141  Data reviewed by me (LT) 07/14/2023: nursing notes, ED physician note, hospitalist H&P last 24h vitals tele labs imaging I/O    Principal Problem:   Atrial fibrillation with RVR (HCC) Active Problems:   CKD stage 3a, GFR 45-59 ml/min (HCC)   Hyperlipidemia, unspecified   BPH (benign prostatic hyperplasia)   Acute on chronic diastolic CHF (congestive heart failure) (HCC)   History of CAD (coronary artery disease)   Anasarca   Normocytic anemia    ASSESSMENT AND PLAN:  Robert Rivas is an 3yoM with a PMH of paroxysmal AF (ablation 2022), paroxysmal SVT (repeat RA ablation 2018, initial ablation 2002), CAD s/p CABG x4 (LIMA-LAD, SVT-D1, RIMA-OM1, SVG-OM2 1992) and hx multiple PCIs (most  recently OM2 graft in 2002), HFpEF (50%, mod biatrial enlargement 01/2021) who presented to an acute visit the afternoon of 07/12/2023 with shortness of breath for 3-4 days with increased LE swelling, HR in clinic was elevated to 140, and the patient was referred immediately to [facility] ED for further management. EKG in the ED showed atrial flutter RVR. Cardiology is consulted for further assistance with his AFL.   # paroxysmal AFL RVR # S/p successful DCCV 07/12/2023 Presents with 3 to 4 days of worsening dyspnea on exertion and associated bilateral lower extremity swelling, and atrial flutter with RVR admission EKG, refractory to IV diltiazem. As below, clinically hypervolemic with pitting LE edema, likely volume overload provoking AFL RVR.  Fortunately remains in NSR today -continue p.o. amiodarone 200 mg twice a day for 14 days, then 200 mg daily thereafter starting 07/27/2023 - continue eliquis 5mg  BID for stroke prevention (meets this criteria with weight >60kg and Cr <1.5).. will need close follow up of BMP outpatient for further DOAC dose adjustments if needed.  -continue metoprolol XL 12.5 mg at night with baseline bradycardia - monitor and replenish electrolytes for K>4 and mag>2  # acute on chronic HFpEF   Excellent diuresis so far, clinically much improved with trace to 1+ LE edema.  - repeat echo complete with mod reduced EF at 45-50%, global HK. Mild LA dilation, mild MR -  change IV lasix to torsemide 20mg  PO daily  - further GDMT adjustments outpatient as BP and renal  function allow. Cr. Uptrending with diuresis 1.26 -- 1.58 -- 1.78.   # CAD s/p CABG, hx multiple prior PCIs # demand ischemia  Chest pain free, EKG nonischemic. Trop elevation most consistent with demand/supply mismatch and not ACS.  - continue home statin  - eliquis monotherapy   Ok for discharge today from a cardiac perspective. Will arrange for follow up in clinic with Dr. Melton Alar in 1-2 weeks.    This patient's plan  of care was discussed and created with Dr. Juliann Pares and he is in agreement.  Signed: Rebeca Allegra , PA-C 07/14/2023, 11:20 AM [facility]

## 2023-07-14 NOTE — TOC Benefit Eligibility Note (Signed)
Pharmacy Patient Advocate Encounter  Insurance verification completed.    The patient is insured through [employer] test claim for Jardiance 10 mg and the current 30 day co-pay is $43.00.  Ran test claim for Farxiga 10 mg and Requires Prior Authorization   This test claim was processed through [employer]- copay amounts may vary at other pharmacies due to [employer], or as the patient moves through the different stages of their insurance plan.    Robert Rivas, CPHT Pharmacy Patient Advocate Specialist [facility] Patient Advocate Team Direct Number: [phone]  Fax: [phone]

## 2023-07-18 ENCOUNTER — Other Ambulatory Visit
Admission: RE | Admit: 2023-07-18 | Discharge: 2023-07-18 | Disposition: A | Discharge status: Home / Self Care | Payer: TRICARE For Life (TFL) | Source: Ambulatory Visit | Attending: Emergency Medicine | Admitting: Emergency Medicine

## 2023-07-18 DIAGNOSIS — I4891 Unspecified atrial fibrillation: Secondary | ICD-10-CM | POA: Insufficient documentation

## 2023-07-18 DIAGNOSIS — I5022 Chronic systolic (congestive) heart failure: Secondary | ICD-10-CM | POA: Diagnosis present

## 2023-07-18 DIAGNOSIS — E785 Hyperlipidemia, unspecified: Secondary | ICD-10-CM | POA: Insufficient documentation

## 2023-07-18 LAB — BRAIN NATRIURETIC PEPTIDE: B Natriuretic Peptide: 651.7 pg/mL — ABNORMAL HIGH (ref 0.0–100.0)

## 2023-07-20 NOTE — Progress Notes (Deleted)
Advanced Heart Failure Clinic Note   Referring Physician: PCP: Marguarite Arbour, MD (last seen 07/24) Primary Cardiologist: Marcina Millard, MD (last seen 02/24)  HPI:  [name] is a 82 y/o male with a history of paroxysmal atrial fibrillation/ flutter s/p ablation 2022, CAD s/p CABG, BPH, CKD, hypertension, hyperlipidemia, PSVT s/p ablation 2018,                   and chronic heart failure.  He had a Holter monitor in 2023 which had showed some evidence of symptomatic bradycardia    Admitted 07/12/23 due to SOB, fatigue and a dry cough. Found to be in AF RVR. Placed on Cardizem drip. Patient was cardioverted by the ER team. Heart rates than dropped into the 40s and Cardizem drip was discontinued. IV diuresed. On amiodarone and metoprolol succinate.   Echo 07/13/23: EF 45-50% along with mild LAE, severe RAE and mild MR.      Review of Systems: [y] = yes, [ ]  = no   General: Weight gain [ ] ; Weight loss [ ] ; Anorexia [ ] ; Fatigue [ ] ; Fever [ ] ; Chills [ ] ; Weakness [ ]   Cardiac: Chest pain/pressure [ ] ; Resting SOB [ ] ; Exertional SOB [ ] ; Orthopnea [ ] ; Pedal Edema [ ] ; Palpitations [ ] ; Syncope [ ] ; Presyncope [ ] ; Paroxysmal nocturnal dyspnea[ ]   Pulmonary: Cough [ ] ; Wheezing[ ] ; Hemoptysis[ ] ; Sputum [ ] ; Snoring [ ]   GI: Vomiting[ ] ; Dysphagia[ ] ; Melena[ ] ; Hematochezia [ ] ; Heartburn[ ] ; Abdominal pain [ ] ; Constipation [ ] ; Diarrhea [ ] ; BRBPR [ ]   GU: Hematuria[ ] ; Dysuria [ ] ; Nocturia[ ]   Vascular: Pain in legs with walking [ ] ; Pain in feet with lying flat [ ] ; Non-healing sores [ ] ; Stroke [ ] ; TIA [ ] ; Slurred speech [ ] ;  Neuro: Headaches[ ] ; Vertigo[ ] ; Seizures[ ] ; Paresthesias[ ] ;Blurred vision [ ] ; Diplopia [ ] ; Vision changes [ ]   Ortho/Skin: Arthritis [ ] ; Joint pain [ ] ; Muscle pain [ ] ; Joint swelling [ ] ; Back Pain [ ] ; Rash [ ]   Psych: Depression[ ] ; Anxiety[ ]   Heme: Bleeding problems [ ] ; Clotting disorders [ ] ; Anemia [ ]   Endocrine: Diabetes [ ] ;  Thyroid dysfunction[ ]    Past Medical History:  Diagnosis Date   A-fib (HCC)    Arthritis    lower back   Basal cell carcinoma 10/29/2020   Right post base of skull/neck   BPH (benign prostatic hyperplasia)    Cancer (HCC)    Skin Cancer   Chronic kidney disease    Chronic Kidney Disease   Coronary artery disease    Dysrhythmia    Atrial Fibrillation; Supraventricular Tachycardia   Gout    Hearing aid worn    has, does not wear   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   L upper back 5.0cm inf to base of neck, 5.0cm lat to spine   Hyperlipidemia    Hypertension    MI, old    Nocturia    Phlebitis    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   SCC IS R foreearm distal   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   SCC IS R forearm proximal   Squamous cell carcinoma of skin 03/26/2018   SCC IS R forearm   Squamous cell carcinoma of skin 01/01/2008   SCC IS R forearm   Wears dentures    partial   Wears hearing aid in both ears  Current Outpatient Medications  Medication Sig Dispense Refill   amiodarone (PACERONE) 200 MG tablet 1 tab po twice a day for 14 days then one tab po daily after that 44 tablet 0   apixaban (ELIQUIS) 2.5 MG TABS tablet Take 2.5 mg by mouth 2 (two) times daily.     calcipotriene (DOVONOX) 0.005 % cream Apply topically 2 (two) times daily. 60 g 0   Cyanocobalamin (VITAMIN B-12 IJ) Inject 1,000 mcg as directed every 30 (thirty) days.     docusate sodium (COLACE) 100 MG capsule Take 100 mg by mouth in the morning.     doxazosin (CARDURA) 2 MG tablet Take 2 mg by mouth daily.     ezetimibe (ZETIA) 10 MG tablet Take 10 mg by mouth at bedtime.      fluorouracil (EFUDEX) 5 % cream Apply topically 2 (two) times daily. 40 g 0   ketoconazole (NIZORAL) 2 % cream Apply 1 Application topically at bedtime. Qhs to feet 60 g 11   metoprolol succinate (TOPROL-XL) 25 MG 24 hr tablet Take 0.5 tablets (12.5 mg total) by mouth at bedtime. 15 tablet 0   montelukast  (SINGULAIR) 10 MG tablet Take 10 mg by mouth at bedtime.     nitroGLYCERIN (NITROSTAT) 0.4 MG SL tablet Place 0.4 mg under the tongue every 5 (five) minutes x 3 doses as needed for chest pain.     potassium chloride SA (KLOR-CON M) 20 MEQ tablet Take 1 tablet (20 mEq total) by mouth daily. 30 tablet 0   probenecid (BENEMID) 500 MG tablet Take 500 mg by mouth daily.     rOPINIRole (REQUIP) 1 MG tablet Take 1 mg by mouth at bedtime.     sennosides-docusate sodium (SENOKOT-S) 8.6-50 MG tablet Take 2 tablets by mouth in the morning and at bedtime.     SYRINGE-NEEDLE, DISP, 3 ML 25G X 1" 3 ML MISC Use 1 Syringe monthly.     torsemide (DEMADEX) 20 MG tablet Take 1 tablet (20 mg total) by mouth daily. 30 tablet 0   No current facility-administered medications for this visit.    Allergies  Allergen Reactions   Codeine Nausea And Vomiting and Nausea Only   Tizanidine Other (See Comments)    drowsiness      Social History   Socioeconomic History   Marital status: Married    Spouse name: Not on file   Number of children: Not on file   Years of education: Not on file   Highest education level: Not on file  Occupational History   Not on file  Tobacco Use   Smoking status: Never   Smokeless tobacco: Never  Vaping Use   Vaping status: Never Used  Substance and Sexual Activity   Alcohol use: No   Drug use: No   Sexual activity: Not on file  Other Topics Concern   Not on file  Social History Narrative   Not on file   Social Determinants of Health   Financial Resource Strain: Not on file  Food Insecurity: No Food Insecurity (07/13/2023)   Hunger Vital Sign    Worried About Running Out of Food in the Last Year: Never true    Ran Out of Food in the Last Year: Never true  Transportation Needs: No Transportation Needs (07/13/2023)   PRAPARE - Administrator, Civil Service (Medical): No    Lack of Transportation (Non-Medical): No  Physical Activity: Not on file  Stress: Not  on file  Social Connections:  Not on file  Intimate Partner Violence: Not At Risk (07/13/2023)   Humiliation, Afraid, Rape, and Kick questionnaire    Fear of Current or Ex-Partner: No    Emotionally Abused: No    Physically Abused: No    Sexually Abused: No      Family History  Problem Relation Age of Onset   CAD Father    Kidney cancer Maternal Uncle    Prostate cancer Neg Hx    Bladder Cancer Neg Hx        PHYSICAL EXAM: General:  Well appearing. No respiratory difficulty HEENT: normal Neck: supple. no JVD. Carotids 2+ bilat; no bruits. No lymphadenopathy or thyromegaly appreciated. Cor: PMI nondisplaced. Regular rate & rhythm. No rubs, gallops or murmurs. Lungs: clear Abdomen: soft, nontender, nondistended. No hepatosplenomegaly. No bruits or masses. Good bowel sounds. Extremities: no cyanosis, clubbing, rash, edema Neuro: alert & oriented x 3, cranial nerves grossly intact. moves all 4 extremities w/o difficulty. Affect pleasant.  ECG:   ASSESSMENT & PLAN:  1: Chronic heart failure with mildly reduced ejection fraction- - suspect due to - NYHA class - euvolemic - weighing daily  - Echo 07/13/23: EF 45-50% along with mild LAE, severe RAE and mild MR.  - continue   - BNP 07/18/23 was 651.7  2: HTN- - BP - saw PCP (Tumey) 07/24 - BMP 07/18/23 showed sodium 140, potassium 4.3, creatinine 1.5 & GFR 46  3: PAF- - saw cardiology  - saw EP Chilton Si) 07/24  4: Lymphedema- - saw vascular Manson Passey) 06/24  5: CAD- - continue - saw cardiology (Paraschos) 02/24   Delma Freeze, FNP 07/20/23

## 2023-07-21 ENCOUNTER — Encounter: Payer: TRICARE For Life (TFL) | Admitting: Family

## 2023-07-21 ENCOUNTER — Telehealth: Payer: Self-pay | Admitting: Family

## 2023-07-21 NOTE — Telephone Encounter (Signed)
Patient did not show for his initial Heart Failure Clinic appointment on 07/21/23

## 2023-08-09 ENCOUNTER — Encounter: Payer: TRICARE For Life (TFL) | Admitting: Family

## 2023-08-09 NOTE — Progress Notes (Deleted)
Advanced Heart Failure Clinic Note   Referring Physician: PCP: Marguarite Arbour, MD (last seen 07/24) Primary Cardiologist: Marcina Millard, MD (last seen 02/24)  HPI:  [name] is a 82 y/o male with a history of paroxysmal atrial fibrillation/ flutter s/p ablation 2022, CAD s/p CABG, BPH, CKD, hypertension, hyperlipidemia, PSVT s/p ablation 2018,                   and chronic heart failure.  He had a Holter monitor in 2023 which had showed some evidence of symptomatic bradycardia    Admitted 07/12/23 due to SOB, fatigue and a dry cough. Found to be in AF RVR. Placed on Cardizem drip. Patient was cardioverted by the ER team. Heart rates than dropped into the 40s and Cardizem drip was discontinued. IV diuresed. On amiodarone and metoprolol succinate.   Echo 07/13/23: EF 45-50% along with mild LAE, severe RAE and mild MR.      Review of Systems: [y] = yes, [ ]  = no   General: Weight gain [ ] ; Weight loss [ ] ; Anorexia [ ] ; Fatigue [ ] ; Fever [ ] ; Chills [ ] ; Weakness [ ]   Cardiac: Chest pain/pressure [ ] ; Resting SOB [ ] ; Exertional SOB [ ] ; Orthopnea [ ] ; Pedal Edema [ ] ; Palpitations [ ] ; Syncope [ ] ; Presyncope [ ] ; Paroxysmal nocturnal dyspnea[ ]   Pulmonary: Cough [ ] ; Wheezing[ ] ; Hemoptysis[ ] ; Sputum [ ] ; Snoring [ ]   GI: Vomiting[ ] ; Dysphagia[ ] ; Melena[ ] ; Hematochezia [ ] ; Heartburn[ ] ; Abdominal pain [ ] ; Constipation [ ] ; Diarrhea [ ] ; BRBPR [ ]   GU: Hematuria[ ] ; Dysuria [ ] ; Nocturia[ ]   Vascular: Pain in legs with walking [ ] ; Pain in feet with lying flat [ ] ; Non-healing sores [ ] ; Stroke [ ] ; TIA [ ] ; Slurred speech [ ] ;  Neuro: Headaches[ ] ; Vertigo[ ] ; Seizures[ ] ; Paresthesias[ ] ;Blurred vision [ ] ; Diplopia [ ] ; Vision changes [ ]   Ortho/Skin: Arthritis [ ] ; Joint pain [ ] ; Muscle pain [ ] ; Joint swelling [ ] ; Back Pain [ ] ; Rash [ ]   Psych: Depression[ ] ; Anxiety[ ]   Heme: Bleeding problems [ ] ; Clotting disorders [ ] ; Anemia [ ]   Endocrine: Diabetes [ ] ;  Thyroid dysfunction[ ]    Past Medical History:  Diagnosis Date   A-fib (HCC)    Arthritis    lower back   Basal cell carcinoma 10/29/2020   Right post base of skull/neck   BPH (benign prostatic hyperplasia)    Cancer (HCC)    Skin Cancer   Chronic kidney disease    Chronic Kidney Disease   Coronary artery disease    Dysrhythmia    Atrial Fibrillation; Supraventricular Tachycardia   Gout    Hearing aid worn    has, does not wear   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   L upper back 5.0cm inf to base of neck, 5.0cm lat to spine   Hyperlipidemia    Hypertension    MI, old    Nocturia    Phlebitis    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   SCC IS R foreearm distal   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   SCC IS R forearm proximal   Squamous cell carcinoma of skin 03/26/2018   SCC IS R forearm   Squamous cell carcinoma of skin 01/01/2008   SCC IS R forearm   Wears dentures    partial   Wears hearing aid in both ears  Current Outpatient Medications  Medication Sig Dispense Refill   amiodarone (PACERONE) 200 MG tablet 1 tab po twice a day for 14 days then one tab po daily after that 44 tablet 0   apixaban (ELIQUIS) 2.5 MG TABS tablet Take 2.5 mg by mouth 2 (two) times daily.     calcipotriene (DOVONOX) 0.005 % cream Apply topically 2 (two) times daily. 60 g 0   Cyanocobalamin (VITAMIN B-12 IJ) Inject 1,000 mcg as directed every 30 (thirty) days.     docusate sodium (COLACE) 100 MG capsule Take 100 mg by mouth in the morning.     doxazosin (CARDURA) 2 MG tablet Take 2 mg by mouth daily.     ezetimibe (ZETIA) 10 MG tablet Take 10 mg by mouth at bedtime.      fluorouracil (EFUDEX) 5 % cream Apply topically 2 (two) times daily. 40 g 0   ketoconazole (NIZORAL) 2 % cream Apply 1 Application topically at bedtime. Qhs to feet 60 g 11   metoprolol succinate (TOPROL-XL) 25 MG 24 hr tablet Take 0.5 tablets (12.5 mg total) by mouth at bedtime. 15 tablet 0   montelukast  (SINGULAIR) 10 MG tablet Take 10 mg by mouth at bedtime.     nitroGLYCERIN (NITROSTAT) 0.4 MG SL tablet Place 0.4 mg under the tongue every 5 (five) minutes x 3 doses as needed for chest pain.     potassium chloride SA (KLOR-CON M) 20 MEQ tablet Take 1 tablet (20 mEq total) by mouth daily. 30 tablet 0   probenecid (BENEMID) 500 MG tablet Take 500 mg by mouth daily.     rOPINIRole (REQUIP) 1 MG tablet Take 1 mg by mouth at bedtime.     sennosides-docusate sodium (SENOKOT-S) 8.6-50 MG tablet Take 2 tablets by mouth in the morning and at bedtime.     SYRINGE-NEEDLE, DISP, 3 ML 25G X 1" 3 ML MISC Use 1 Syringe monthly.     torsemide (DEMADEX) 20 MG tablet Take 1 tablet (20 mg total) by mouth daily. 30 tablet 0   No current facility-administered medications for this visit.    Allergies  Allergen Reactions   Codeine Nausea And Vomiting and Nausea Only   Tizanidine Other (See Comments)    drowsiness      Social History   Socioeconomic History   Marital status: Married    Spouse name: Not on file   Number of children: Not on file   Years of education: Not on file   Highest education level: Not on file  Occupational History   Not on file  Tobacco Use   Smoking status: Never   Smokeless tobacco: Never  Vaping Use   Vaping status: Never Used  Substance and Sexual Activity   Alcohol use: No   Drug use: No   Sexual activity: Not on file  Other Topics Concern   Not on file  Social History Narrative   Not on file   Social Determinants of Health   Financial Resource Strain: Not on file  Food Insecurity: No Food Insecurity (07/13/2023)   Hunger Vital Sign    Worried About Running Out of Food in the Last Year: Never true    Ran Out of Food in the Last Year: Never true  Transportation Needs: No Transportation Needs (07/13/2023)   PRAPARE - Administrator, Civil Service (Medical): No    Lack of Transportation (Non-Medical): No  Physical Activity: Not on file  Stress: Not  on file  Social Connections:  Not on file  Intimate Partner Violence: Not At Risk (07/13/2023)   Humiliation, Afraid, Rape, and Kick questionnaire    Fear of Current or Ex-Partner: No    Emotionally Abused: No    Physically Abused: No    Sexually Abused: No      Family History  Problem Relation Age of Onset   CAD Father    Kidney cancer Maternal Uncle    Prostate cancer Neg Hx    Bladder Cancer Neg Hx        PHYSICAL EXAM: General:  Well appearing. No respiratory difficulty HEENT: normal Neck: supple. no JVD. Carotids 2+ bilat; no bruits. No lymphadenopathy or thyromegaly appreciated. Cor: PMI nondisplaced. Regular rate & rhythm. No rubs, gallops or murmurs. Lungs: clear Abdomen: soft, nontender, nondistended. No hepatosplenomegaly. No bruits or masses. Good bowel sounds. Extremities: no cyanosis, clubbing, rash, edema Neuro: alert & oriented x 3, cranial nerves grossly intact. moves all 4 extremities w/o difficulty. Affect pleasant.  ECG:   ASSESSMENT & PLAN:  1: Chronic heart failure with mildly reduced ejection fraction- - suspect due to - NYHA class - euvolemic - weighing daily  - Echo 07/13/23: EF 45-50% along with mild LAE, severe RAE and mild MR.  - continue   - BNP 07/18/23 was 651.7  2: HTN- - BP - saw PCP (Tumey) 07/24 - BMP 07/18/23 showed sodium 140, potassium 4.3, creatinine 1.5 & GFR 46  3: PAF- - saw cardiology  - saw EP Chilton Si) 07/24  4: Lymphedema- - saw vascular Manson Passey) 06/24  5: CAD- - continue - saw cardiology (Paraschos) 02/24   Delma Freeze, FNP 08/09/23

## 2023-08-16 ENCOUNTER — Encounter: Payer: Self-pay | Admitting: Family

## 2023-08-16 ENCOUNTER — Ambulatory Visit: Payer: Medicare PPO | Attending: Family | Admitting: Family

## 2023-08-16 VITALS — BP 123/81 | HR 93 | Ht 73.0 in | Wt 202.0 lb

## 2023-08-16 DIAGNOSIS — I5023 Acute on chronic systolic (congestive) heart failure: Secondary | ICD-10-CM | POA: Diagnosis present

## 2023-08-16 DIAGNOSIS — I2510 Atherosclerotic heart disease of native coronary artery without angina pectoris: Secondary | ICD-10-CM | POA: Insufficient documentation

## 2023-08-16 DIAGNOSIS — I10 Essential (primary) hypertension: Secondary | ICD-10-CM

## 2023-08-16 DIAGNOSIS — N189 Chronic kidney disease, unspecified: Secondary | ICD-10-CM | POA: Insufficient documentation

## 2023-08-16 DIAGNOSIS — I890 Lymphedema, not elsewhere classified: Secondary | ICD-10-CM | POA: Insufficient documentation

## 2023-08-16 DIAGNOSIS — I480 Paroxysmal atrial fibrillation: Secondary | ICD-10-CM | POA: Insufficient documentation

## 2023-08-16 DIAGNOSIS — I739 Peripheral vascular disease, unspecified: Secondary | ICD-10-CM | POA: Diagnosis not present

## 2023-08-16 DIAGNOSIS — I2583 Coronary atherosclerosis due to lipid rich plaque: Secondary | ICD-10-CM

## 2023-08-16 DIAGNOSIS — I5022 Chronic systolic (congestive) heart failure: Secondary | ICD-10-CM | POA: Diagnosis not present

## 2023-08-16 DIAGNOSIS — I130 Hypertensive heart and chronic kidney disease with heart failure and stage 1 through stage 4 chronic kidney disease, or unspecified chronic kidney disease: Secondary | ICD-10-CM | POA: Diagnosis not present

## 2023-08-16 DIAGNOSIS — Z951 Presence of aortocoronary bypass graft: Secondary | ICD-10-CM | POA: Insufficient documentation

## 2023-08-16 NOTE — Progress Notes (Signed)
Advanced Heart Failure Clinic Note  PCP: Marguarite Arbour, MD (last seen 07/24) Primary Cardiologist: Marcina Millard, MD (last seen 02/24) HF provider: Jose Persia, MD (last seen 08/24)  HPI:  [name] is a 82 y/o male with a history of paroxysmal atrial fibrillation/ flutter s/p ablation 2022, CAD s/p CABG, BPH, CKD, hypertension, hyperlipidemia, PSVT s/p ablation 2018  and chronic heart failure.  He had a Holter monitor in 2023 which had showed some evidence of symptomatic bradycardia.  Admitted 07/12/23 due to SOB, fatigue and a dry cough. Found to be in AF RVR. Placed on Cardizem drip. Patient was cardioverted by the ER team. Heart rates than dropped into the 40s and Cardizem drip was discontinued. IV diuresed. On amiodarone and metoprolol succinate.   Echo 07/13/23: EF 45-50% along with mild LAE, severe RAE and mild MR.   He presents today for his initial visit with a chief complaint of moderate fatigue with minimal exertion. Chronic in nature. Has associated palpitations, dry cough, pedal edema & abdominal distention along with this. He says that his swelling has improved since being in the hospital last month. Denies shortness of breath, chest pain, dizziness or difficulty sleeping. Reports sleeping well on 1 pillow. Currently wearing compression socks on both lower legs. Reports that his right leg is always more swollen than the left due to the vein being taken out of his right leg for his CABG.  Review of Systems: [y] = yes, [ ]  = no   General: Weight gain [ ] ; Weight loss [ ] ; Anorexia [ ] ; Fatigue [ y]; Fever [ ] ; Chills [ ] ; Weakness [ ]   Cardiac: Chest pain/pressure [ ] ; Resting SOB [ ] ; Exertional SOB [ ] ; Orthopnea [ ] ; Pedal Edema Cove.Etienne ]; Palpitations Cove.Etienne ]; Syncope [ ] ; Presyncope [ ] ; Paroxysmal nocturnal dyspnea[ ]   Pulmonary: Cough Cove.Etienne ]; Wheezing[ ] ; Hemoptysis[ ] ; Sputum [ ] ; Snoring [ ]   GI: Vomiting[ ] ; Dysphagia[ ] ; Melena[ ] ; Hematochezia [ ] ; Heartburn[ ] ;  Abdominal pain [ ] ; Constipation [ ] ; Diarrhea [ ] ; BRBPR [ ]   GU: Hematuria[ ] ; Dysuria [ ] ; Nocturia[ ]   Vascular: Pain in legs with walking [ ] ; Pain in feet with lying flat [ ] ; Non-healing sores [ ] ; Stroke [ ] ; TIA [ ] ; Slurred speech [ ] ;  Neuro: Headaches[ ] ; Vertigo[ ] ; Seizures[ ] ; Paresthesias[ ] ;Blurred vision [ ] ; Diplopia [ ] ; Vision changes [ ]   Ortho/Skin: Arthritis [ ] ; Joint pain [ ] ; Muscle pain [ ] ; Joint swelling [ ] ; Back Pain [ ] ; Rash [ ]   Psych: Depression[ ] ; Anxiety[ ]   Heme: Bleeding problems [ ] ; Clotting disorders [ ] ; Anemia [ ]   Endocrine: Diabetes [ ] ; Thyroid dysfunction[ ]    Past Medical History:  Diagnosis Date   A-fib (HCC)    Arthritis    lower back   Basal cell carcinoma 10/29/2020   Right post base of skull/neck   BPH (benign prostatic hyperplasia)    Cancer (HCC)    Skin Cancer   Chronic kidney disease    Chronic Kidney Disease   Coronary artery disease    Dysrhythmia    Atrial Fibrillation; Supraventricular Tachycardia   Gout    Hearing aid worn    has, does not wear   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   L upper back 5.0cm inf to base of neck, 5.0cm lat to spine   Hyperlipidemia    Hypertension    MI, old    Nocturia  Phlebitis    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   SCC IS R foreearm distal   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   SCC IS R forearm proximal   Squamous cell carcinoma of skin 03/26/2018   SCC IS R forearm   Squamous cell carcinoma of skin 01/01/2008   SCC IS R forearm   Wears dentures    partial   Wears hearing aid in both ears     Current Outpatient Medications  Medication Sig Dispense Refill   amiodarone (PACERONE) 200 MG tablet 1 tab po twice a day for 14 days then one tab po daily after that 44 tablet 0   apixaban (ELIQUIS) 2.5 MG TABS tablet Take 2.5 mg by mouth 2 (two) times daily.     doxazosin (CARDURA) 2 MG tablet Take 2 mg by mouth daily.     doxepin (SINEQUAN) 25 MG capsule Take 25  mg by mouth. Once daily at bedtime     empagliflozin (JARDIANCE) 10 MG TABS tablet Take by mouth daily. Once daily     ezetimibe (ZETIA) 10 MG tablet Take 10 mg by mouth at bedtime.      montelukast (SINGULAIR) 10 MG tablet Take 10 mg by mouth at bedtime.     polyethylene glycol powder (GLYCOLAX/MIRALAX) 17 GM/SCOOP powder Take 1 Container by mouth once. Once daily     probenecid (BENEMID) 500 MG tablet Take 500 mg by mouth daily.     rOPINIRole (REQUIP) 1 MG tablet Take 1 mg by mouth at bedtime.     sacubitril-valsartan (ENTRESTO) 49-51 MG Take 1 tablet by mouth 2 (two) times daily. BID     simvastatin (ZOCOR) 20 MG tablet Take 20 mg by mouth daily. Once daily at bedtime     traMADol (ULTRAM) 50 MG tablet Take by mouth every 6 (six) hours as needed. Bedtime prn     calcipotriene (DOVONOX) 0.005 % cream Apply topically 2 (two) times daily. (Patient not taking: Reported on 08/16/2023) 60 g 0   Cyanocobalamin (VITAMIN B-12 IJ) Inject 1,000 mcg as directed every 30 (thirty) days. (Patient not taking: Reported on 08/16/2023)     docusate sodium (COLACE) 100 MG capsule Take 100 mg by mouth in the morning. (Patient not taking: Reported on 08/16/2023)     fluorouracil (EFUDEX) 5 % cream Apply topically 2 (two) times daily. (Patient not taking: Reported on 08/16/2023) 40 g 0   ketoconazole (NIZORAL) 2 % cream Apply 1 Application topically at bedtime. Qhs to feet (Patient not taking: Reported on 08/16/2023) 60 g 11   metoprolol succinate (TOPROL-XL) 25 MG 24 hr tablet Take 0.5 tablets (12.5 mg total) by mouth at bedtime. (Patient not taking: Reported on 08/16/2023) 15 tablet 0   nitroGLYCERIN (NITROSTAT) 0.4 MG SL tablet Place 0.4 mg under the tongue every 5 (five) minutes x 3 doses as needed for chest pain. (Patient not taking: Reported on 08/16/2023)     potassium chloride SA (KLOR-CON M) 20 MEQ tablet Take 1 tablet (20 mEq total) by mouth daily. (Patient not taking: Reported on 08/16/2023) 30 tablet 0    sennosides-docusate sodium (SENOKOT-S) 8.6-50 MG tablet Take 2 tablets by mouth in the morning and at bedtime. (Patient not taking: Reported on 08/16/2023)     SYRINGE-NEEDLE, DISP, 3 ML 25G X 1" 3 ML MISC Use 1 Syringe monthly. (Patient not taking: Reported on 08/16/2023)     torsemide (DEMADEX) 20 MG tablet Take 1 tablet (20 mg total) by mouth daily. (Patient not  taking: Reported on 08/16/2023) 30 tablet 0   No current facility-administered medications for this visit.    Allergies  Allergen Reactions   Codeine Nausea And Vomiting and Nausea Only   Tizanidine Other (See Comments)    drowsiness      Social History   Socioeconomic History   Marital status: Married    Spouse name: Not on file   Number of children: Not on file   Years of education: Not on file   Highest education level: Not on file  Occupational History   Not on file  Tobacco Use   Smoking status: Never   Smokeless tobacco: Never  Vaping Use   Vaping status: Never Used  Substance and Sexual Activity   Alcohol use: No   Drug use: No   Sexual activity: Not on file  Other Topics Concern   Not on file  Social History Narrative   Not on file   Social Determinants of Health   Financial Resource Strain: Not on file  Food Insecurity: No Food Insecurity (07/13/2023)   Hunger Vital Sign    Worried About Running Out of Food in the Last Year: Never true    Ran Out of Food in the Last Year: Never true  Transportation Needs: No Transportation Needs (07/13/2023)   PRAPARE - Administrator, Civil Service (Medical): No    Lack of Transportation (Non-Medical): No  Physical Activity: Not on file  Stress: Not on file  Social Connections: Not on file  Intimate Partner Violence: Not At Risk (07/13/2023)   Humiliation, Afraid, Rape, and Kick questionnaire    Fear of Current or Ex-Partner: No    Emotionally Abused: No    Physically Abused: No    Sexually Abused: No      Family History  Problem Relation Age of  Onset   CAD Father    Kidney cancer Maternal Uncle    Prostate cancer Neg Hx    Bladder Cancer Neg Hx    Vitals:   08/16/23 1432  BP: 123/81  Pulse: 93  SpO2: 99%  Weight: 202 lb (91.6 kg)  Height: 6\' 1"  (1.854 m)   Wt Readings from Last 3 Encounters:  08/16/23 202 lb (91.6 kg)  07/14/23 204 lb 11.2 oz (92.9 kg)  06/01/23 215 lb 12.8 oz (97.9 kg)   Lab Results  Component Value Date   CREATININE 1.78 (H) 07/14/2023   CREATININE 1.58 (H) 07/13/2023   CREATININE 1.26 (H) 07/12/2023   PHYSICAL EXAM: General:  Well appearing. No respiratory difficulty HEENT: normal Neck: supple. no JVD. No lymphadenopathy or thyromegaly appreciated. Cor: PMI nondisplaced. Regular rate & rhythm. No rubs, gallops or murmurs. Lungs: clear Abdomen: soft, nontender, nondistended. No hepatosplenomegaly. No bruits or masses.  Extremities: no cyanosis, clubbing, rash, 1+ pitting edema left leg and 2+ right leg Neuro: alert & oriented x 3, cranial nerves grossly intact. moves all 4 extremities w/o difficulty. Affect pleasant.  ECG: not done   ASSESSMENT & PLAN:  1: Chronic heart failure with mildly reduced ejection fraction- - suspect due to PAF/ HTN - NYHA class III - euvolemic - not weighing daily; instructed to begin and call for an overnight weight gain of > 2 pounds or a weekly weight gain of > 5 pounds - Echo 07/13/23: EF 45-50% along with mild LAE, severe RAE and mild MR.  - not adding salt to his food - continue jardiance 10mg  daily - continue metoprolol 12.5mg  daily (although patient/ wife unsure of  this) - continue entresto 49/51mg  BID - continue torsemide 20mg  daily (not currently taking potassium) - consider adding spironolactone although patient/ wife unclear of some of his meds - BNP 07/18/23 was 651.7  2: HTN- - BP 123/81 - saw PCP (Tumey) 07/24 - BMP 08/15/23 showed sodium 139, potassium 3.9, creatinine 1.4 & GFR 50 - continue doxazosin 2mg  daily  3: PAF- - saw cardiology   - saw EP Maisie Fus) 08/24 - continue amiodarone 200mg  daily - continue apixaban 2.5mg  BID - wore holter monitor 07/24; no afib noted  4: Lymphedema- - saw vascular Manson Passey) 06/24 - wearing compression socks daily with removal at bedtime - says that his legs aren't swollen much when he gets up in the mornings  5: CAD- - continue ezetimibe 10mg   - continue simvastatin 20mg  daily - saw cardiology (Paraschos) 02/24  Return in 6 weeks, sooner if needed  Delma Freeze, FNP 08/16/23

## 2023-08-16 NOTE — Patient Instructions (Addendum)
Please bring your medications with you at your next visit  Begin weighing daily and call for an overnight weight gain of 3 pounds or more or a weekly weight gain of more than 5 pounds.

## 2023-09-04 ENCOUNTER — Ambulatory Visit (INDEPENDENT_AMBULATORY_CARE_PROVIDER_SITE_OTHER): Payer: TRICARE For Life (TFL) | Admitting: Vascular Surgery

## 2023-09-14 ENCOUNTER — Ambulatory Visit: Payer: Medicare PPO | Admitting: Dermatology

## 2023-09-14 DIAGNOSIS — L814 Other melanin hyperpigmentation: Secondary | ICD-10-CM | POA: Diagnosis not present

## 2023-09-14 DIAGNOSIS — W908XXA Exposure to other nonionizing radiation, initial encounter: Secondary | ICD-10-CM | POA: Diagnosis not present

## 2023-09-14 DIAGNOSIS — L570 Actinic keratosis: Secondary | ICD-10-CM

## 2023-09-14 DIAGNOSIS — L820 Inflamed seborrheic keratosis: Secondary | ICD-10-CM

## 2023-09-14 DIAGNOSIS — Z79899 Other long term (current) drug therapy: Secondary | ICD-10-CM

## 2023-09-14 DIAGNOSIS — L578 Other skin changes due to chronic exposure to nonionizing radiation: Secondary | ICD-10-CM

## 2023-09-14 DIAGNOSIS — Z5111 Encounter for antineoplastic chemotherapy: Secondary | ICD-10-CM

## 2023-09-14 DIAGNOSIS — D692 Other nonthrombocytopenic purpura: Secondary | ICD-10-CM

## 2023-09-14 DIAGNOSIS — L821 Other seborrheic keratosis: Secondary | ICD-10-CM | POA: Diagnosis not present

## 2023-09-14 DIAGNOSIS — Z7189 Other specified counseling: Secondary | ICD-10-CM

## 2023-09-14 DIAGNOSIS — D229 Melanocytic nevi, unspecified: Secondary | ICD-10-CM

## 2023-09-14 NOTE — Progress Notes (Signed)
Follow-Up Visit   Subjective  Robert Rivas is a 82 y.o. male who presents for the following: AK follow up s/p topical 5FU/Calcipotriene mix to the forehead and temples. Pt states he did get a reaction, and since lesions have healed the areas he treated feels much smoother.   The patient has spots, moles and lesions to be evaluated, some may be new or changing and the patient may have concern these could be cancer.   The following portions of the chart were reviewed this encounter and updated as appropriate: medications, allergies, medical history  Review of Systems:  No other skin or systemic complaints except as noted in HPI or Assessment and Plan.  Objective  Well appearing patient in no apparent distress; mood and affect are within normal limits.  A focused examination was performed of the following areas: the face, scalp, arms, and hands   Relevant exam findings are noted in the Assessment and Plan.  Scalp x 20, L ear x 1 (21) Erythematous thin papules/macules with gritty scale.   Scalp x 1 Erythematous stuck-on, waxy papule or plaque    Assessment & Plan   ACTINIC DAMAGE WITH PRECANCEROUS ACTINIC KERATOSES Counseling for Topical Chemotherapy Management: Patient exhibits: - Severe, confluent actinic changes with pre-cancerous actinic keratoses that is secondary to cumulative UV radiation exposure over time - Condition that is severe; chronic, not at goal. - diffuse scaly erythematous macules and papules with underlying dyspigmentation - Discussed Prescription "Field Treatment" topical Chemotherapy for Severe, Chronic Confluent Actinic Changes with Pre-Cancerous Actinic Keratoses Field treatment involves treatment of an entire area of skin that has confluent Actinic Changes (Sun/ Ultraviolet light damage) and PreCancerous Actinic Keratoses by method of PhotoDynamic Therapy (PDT) and/or prescription Topical Chemotherapy agents such as 5-fluorouracil,  5-fluorouracil/calcipotriene, and/or imiquimod.  The purpose is to decrease the number of clinically evident and subclinical PreCancerous lesions to prevent progression to development of skin cancer by chemically destroying early precancer changes that may or may not be visible.  It has been shown to reduce the risk of developing skin cancer in the treated area. As a result of treatment, redness, scaling, crusting, and open sores may occur during treatment course. One or more than one of these methods may be used and may have to be used several times to control, suppress and eliminate the PreCancerous changes. Discussed treatment course, expected reaction, and possible side effects. - Recommend daily broad spectrum sunscreen SPF 30+ to sun-exposed areas, reapply every 2 hours as needed.  - Staying in the shade or wearing long sleeves, sun glasses (UVA+UVB protection) and wide brim hats (4-inch brim around the entire circumference of the hat) are also recommended. - Call for new or changing lesions.  - In one month start 5FU/Calcipotriene mix BID x 10 days to the scalp.  SEBORRHEIC KERATOSIS - Stuck-on, waxy, tan-brown papules and/or plaques  - Benign-appearing - Discussed benign etiology and prognosis. - Observe - Call for any changes  LENTIGINES Exam: scattered tan macules Due to sun exposure Treatment Plan: Benign-appearing, observe. Recommend daily broad spectrum sunscreen SPF 30+ to sun-exposed areas, reapply every 2 hours as needed.  Call for any changes  MELANOCYTIC NEVI Exam: Tan-brown and/or pink-flesh-colored symmetric macules and papules  Treatment Plan: Benign appearing on exam today. Recommend observation. Call clinic for new or changing moles. Recommend daily use of broad spectrum spf 30+ sunscreen to sun-exposed areas.   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 They are chronic with expected duration over 1 year. A portion of actinic keratoses will progress to squamous cell carcinoma of the skin. It is not possible to reliably predict which spots will progress to skin cancer and so treatment is recommended to prevent development of skin cancer.  Recommend daily broad spectrum sunscreen SPF 30+ to sun-exposed areas, reapply every 2 hours as needed.  Recommend staying in the shade or wearing long sleeves, sun glasses (UVA+UVB protection) and wide brim hats (4-inch brim around the entire circumference of the hat). Call for new or changing lesions.   Destruction of lesion - Scalp x 20, L ear x 1 (21) Complexity: simple   Destruction method: cryotherapy   Informed consent: discussed and consent obtained   Timeout:  patient name, date of birth, surgical site, and procedure verified Lesion destroyed using liquid nitrogen: Yes   Region frozen until ice ball extended beyond lesion: Yes   Outcome: patient tolerated procedure well with no complications   Post-procedure details: wound care instructions given    Inflamed seborrheic keratosis Scalp x 1  Symptomatic, irritating, patient would like treated.   Destruction of lesion - Scalp x 1 Complexity: simple   Destruction method: cryotherapy   Informed consent: discussed and consent obtained   Timeout:  patient name, date of birth, surgical site, and procedure verified Lesion destroyed using liquid nitrogen: Yes   Region frozen until ice ball extended beyond lesion: Yes   Outcome: patient tolerated procedure well with no complications   Post-procedure details: wound care instructions given    Purpura - Chronic; persistent and recurrent.  Treatable, but not curable. - Violaceous macules and patches - Benign - Related to trauma, age, sun damage and/or use of blood thinners, chronic use of topical and/or oral steroids - Observe - Can use OTC arnica containing moisturizer such as  Dermend Bruise Formula if desired - Call for worsening or other concerns  Return in about 8 months (around 05/13/2024) for TBSE.  Maylene Roes, CMA, am acting as scribe for Armida Sans, MD .  Documentation: I have reviewed the above documentation for accuracy and completeness, and I agree with the above.  Armida Sans, MD

## 2023-09-14 NOTE — Patient Instructions (Addendum)
In one month start calcipotriene 0.005% cream and fluorouracil 5% cream twice daily to the scalp for 10 days.   5-Fluorouracil/Calcipotriene Patient Education   Actinic keratoses are the dry, red scaly spots on the skin caused by sun damage. A portion of these spots can turn into skin cancer with time, and treating them can help prevent development of skin cancer.   Treatment of these spots requires removal of the defective skin cells. There are various ways to remove actinic keratoses, including freezing with liquid nitrogen, treatment with creams, or treatment with a blue light procedure in the office.   5-fluorouracil cream is a topical cream used to treat actinic keratoses. It works by interfering with the growth of abnormal fast-growing skin cells, such as actinic keratoses. These cells peel off and are replaced by healthy ones. THIS CREAM SHOULD BE KEPT OUT OF REACH OF CHILDREN AND PETS AND SHOULD NOT BE USED BY PREGNANT WOMEN.  5-fluorouracil/calcipotriene is a combination of the 5-fluorouracil cream with a vitamin D analog cream called calcipotriene. The calcipotriene alone does not treat actinic keratoses. However, when it is combined with 5-fluorouracil, it helps the 5-fluorouracil treat the actinic keratoses much faster so that the same results can be achieved with a much shorter treatment time.  INSTRUCTIONS FOR 5-FLUOROURACIL/CALCIPOTRIENE CREAM:   5-fluorouracil/calcipotriene cream typically only needs to be used for 4-7 days. A thin layer should be applied twice a day to the treatment areas recommended by your physician.   If your physician prescribed you separate tubes of 5-fluourouracil and calcipotriene, apply a thin layer of 5-fluorouracil followed by a thin layer of calcipotriene.   Avoid contact with your eyes or nostrils. Avoid applying the cream to your eyelids or lips unless directed to apply there by your physician. Do not use 5-fluorouracil/calcipotriene cream on infected  or open wounds.   You will develop redness, irritation and some crusting at areas where you have pre-cancer damage/actinic keratoses. IF YOU DEVELOP PAIN, BLEEDING, OR SIGNIFICANT CRUSTING, STOP THE TREATMENT EARLY - you have already gotten a good response and the actinic keratoses should clear up well.  Wash your hands after applying 5-fluorouracil 5% cream on your skin.   A moisturizer or sunscreen with a minimum SPF 30 should be applied each morning.   Once you have finished the treatment, you can apply a thin layer of Vaseline twice a day to irritated areas to soothe and calm the areas more quickly. If you experience significant discomfort, contact your physician.  For some patients it is necessary to repeat the treatment for best results.  SIDE EFFECTS: When using 5-fluorouracil/calcipotriene cream, you may have mild irritation, such as redness, dryness, swelling, or a mild burning sensation. This usually resolves within 2 weeks. The more actinic keratoses you have, the more redness and inflammation you can expect during treatment. Eye irritation has been reported rarely. If this occurs, please let us know.   If you have any trouble using this cream, please send [place] a MyChart message or call the office. If you have any other questions about this information, please do not hesitate to ask me before you leave the office or contact me on MyChart or by phone.   Due to recent changes in healthcare laws, you may see results of your pathology and/or laboratory studies on MyChart before the doctors have had a chance to review them. We understand that in some cases there may be results that are confusing or concerning to you. Please understand that not all  results are received at the same time and often the doctors may need to interpret multiple results in order to provide you with the best plan of care or course of treatment. Therefore, we ask that you please give [place] 2 business days to thoroughly review  all your results before contacting the office for clarification. Should we see a critical lab result, you will be contacted sooner.   If You Need Anything After Your Visit  If you have any questions or concerns for your doctor, please call our main line at [phone] and press option 4 to reach your doctor's medical assistant. If no one answers, please leave a voicemail as directed and we will return your call as soon as possible. Messages left after 4 pm will be answered the following business day.   You may also send [place] a message via MyChart. We typically respond to MyChart messages within 1-2 business days.  For prescription refills, please ask your pharmacy to contact our office. Our fax number is [phone].  If you have an urgent issue when the clinic is closed that cannot wait until the next business day, you can page your doctor at the number below.    Please note that while we do our best to be available for urgent issues outside of office hours, we are not available 24/7.   If you have an urgent issue and are unable to reach [place], you may choose to seek medical care at your doctor's office, retail clinic, urgent care center, or emergency room.  If you have a medical emergency, please immediately call 911 or go to the emergency department.  Pager Numbers  - Dr. Gwen Pounds: [phone]  - Dr. Roseanne Reno: [phone]  - Dr. Katrinka Blazing: [phone]   In the event of inclement weather, please call our main line at [phone] for an update on the status of any delays or closures.  Dermatology Medication Tips: Please keep the boxes that topical medications come in in order to help keep track of the instructions about where and how to use these. Pharmacies typically print the medication instructions only on the boxes and not directly on the medication tubes.   If your medication is too expensive, please contact our office at [phone] option 4 or send [place] a message through MyChart.    We are unable to tell what your co-pay for medications will be in advance as this is different depending on your insurance coverage. However, we may be able to find a substitute medication at lower cost or fill out paperwork to get insurance to cover a needed medication.   If a prior authorization is required to get your medication covered by your insurance company, please allow [place] 1-2 business days to complete this process.  Drug prices often vary depending on where the prescription is filled and some pharmacies may offer cheaper prices.  The website www.goodrx.com contains coupons for medications through different pharmacies. The prices here do not account for what the cost may be with help from insurance (it may be cheaper with your insurance), but the website can give you the price if you did not use any insurance.  - You can print the associated coupon and take it with your prescription to the pharmacy.  - You may also stop by our office during regular business hours and pick up a GoodRx coupon card.  - If you need your prescription sent electronically to a different pharmacy, notify our office through [facility] or by phone at  [phone] option 4.     Si Usted Necesita Algo Despus de Su Visita  Tambin puede enviarnos un mensaje a travs de Clinical cytogeneticist. Por lo general respondemos a los mensajes de MyChart en el transcurso de 1 a 2 das hbiles.  Para renovar recetas, por favor pida a su farmacia que se ponga en contacto con nuestra oficina. Annie Sable de fax es [place] [phone].  Si tiene un asunto urgente cuando la clnica est cerrada y que no puede esperar hasta el siguiente da hbil, puede llamar/localizar a su doctor(a) al nmero que aparece a continuacin.   Por favor, tenga en cuenta que aunque hacemos todo lo posible para estar disponibles para asuntos urgentes fuera del horario de [place], no estamos disponibles las 24 horas del da, los 7 [address]  [address] de la [place].    Si tiene un problema urgente y no puede comunicarse con nosotros, puede optar por buscar atencin mdica  en el consultorio de su doctor(a), en una clnica privada, en un centro de atencin urgente o en una sala de emergencias.  Si tiene Engineer, drilling, por favor llame inmediatamente al 911 o vaya a la sala de emergencias.  Nmeros de bper  - Dr. Gwen Pounds: [phone]  - Dra. Roseanne Reno: [phone]  - Dr. Katrinka Blazing: [phone]   En caso de inclemencias del tiempo, por favor llame a Lacy Duverney principal al [phone] para una actualizacin sobre el [place] de cualquier retraso o cierre.  Consejos para la medicacin en dermatologa: Por favor, guarde las cajas en las que vienen los medicamentos de uso tpico para ayudarle a seguir las instrucciones sobre dnde y cmo usarlos. Las farmacias generalmente imprimen las instrucciones del medicamento slo en las cajas y no directamente en los tubos del [place].   Si su medicamento es muy caro, por favor, pngase en contacto con Rolm Gala llamando al [phone] y presione la opcin 4 o envenos un mensaje a travs de Clinical cytogeneticist.   No podemos decirle cul ser su copago por los medicamentos por adelantado ya que esto es diferente dependiendo de la cobertura de su seguro. Sin embargo, es posible que podamos encontrar un medicamento sustituto a Audiological scientist un formulario para que el seguro cubra el medicamento que se considera necesario.   Si se requiere una autorizacin previa para que su compaa de seguros [place] su medicamento, por favor permtanos de 1 a 2 das hbiles para completar [address]  Los precios de los medicamentos varan con frecuencia dependiendo del Environmental consultant de dnde se surte la receta y alguna farmacias pueden ofrecer precios ms baratos.  El sitio web www.goodrx.com tiene cupones para medicamentos de Health and safety inspector. Los precios aqu no tienen en cuenta lo que podra costar con la ayuda del seguro  (puede ser ms barato con su seguro), pero el sitio web puede darle el precio si no utiliz Tourist information centre manager.  - Puede imprimir el cupn correspondiente y llevarlo con su receta a la farmacia.  - Tambin puede pasar por nuestra oficina durante el horario de atencin regular y Education officer, museum una tarjeta de cupones de GoodRx.  - Si necesita que su receta se enve electrnicamente a una farmacia diferente, informe a nuestra oficina a travs de MyChart de  o por telfono llamando al [phone] y presione la opcin 4.

## 2023-09-19 ENCOUNTER — Encounter: Payer: Self-pay | Admitting: Dermatology

## 2023-09-26 NOTE — Progress Notes (Deleted)
Advanced Heart Failure Clinic Note  PCP: Marguarite Arbour, MD (last seen 09/24) Primary Cardiologist: Marcina Millard, MD (last seen 02/24) HF provider: Jose Persia, MD (last seen 08/24)  HPI:  Mr Roots is a 82 y/o male with a history of paroxysmal atrial fibrillation/ flutter s/p ablation 2022, CAD s/p CABG, BPH, CKD, hypertension, hyperlipidemia, PSVT s/p ablation 2018  and chronic heart failure.  He had a Holter monitor in 2023 which had showed some evidence of symptomatic bradycardia.  Admitted 07/12/23 due to SOB, fatigue and a dry cough. Found to be in AF RVR. Placed on Cardizem drip. Patient was cardioverted by the ER team. Heart rates than dropped into the 40s and Cardizem drip was discontinued. IV diuresed. On amiodarone and metoprolol succinate.   Echo 07/13/23: EF 45-50% along with mild LAE, severe RAE and mild MR.   He presents today for a HF visit with a chief complaint of     ROS: All systems negative except as listed in HPI, PMH and Problem List.  SH:  Social History   Socioeconomic History   Marital status: Married    Spouse name: Not on file   Number of children: Not on file   Years of education: Not on file   Highest education level: Not on file  Occupational History   Not on file  Tobacco Use   Smoking status: Never   Smokeless tobacco: Never  Vaping Use   Vaping status: Never Used  Substance and Sexual Activity   Alcohol use: No   Drug use: No   Sexual activity: Not on file  Other Topics Concern   Not on file  Social History Narrative   Not on file   Social Determinants of Health   Financial Resource Strain: Not on file  Food Insecurity: No Food Insecurity (07/13/2023)   Hunger Vital Sign    Worried About Running Out of Food in the Last Year: Never true    Ran Out of Food in the Last Year: Never true  Transportation Needs: No Transportation Needs (07/13/2023)   PRAPARE - Administrator, Civil Service (Medical): No    Lack  of Transportation (Non-Medical): No  Physical Activity: Not on file  Stress: Not on file  Social Connections: Not on file  Intimate Partner Violence: Not At Risk (07/13/2023)   Humiliation, Afraid, Rape, and Kick questionnaire    Fear of Current or Ex-Partner: No    Emotionally Abused: No    Physically Abused: No    Sexually Abused: No    FH:  Family History  Problem Relation Age of Onset   CAD Father    Kidney cancer Maternal Uncle    Prostate cancer Neg Hx    Bladder Cancer Neg Hx     Past Medical History:  Diagnosis Date   A-fib (HCC)    Arthritis    lower back   Basal cell carcinoma 10/29/2020   Right post base of skull/neck   BPH (benign prostatic hyperplasia)    Cancer (HCC)    Skin Cancer   Chronic kidney disease    Chronic Kidney Disease   Coronary artery disease    Dysrhythmia    Atrial Fibrillation; Supraventricular Tachycardia   Gout    Hearing aid worn    has, does not wear   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   L upper back 5.0cm inf to base of neck, 5.0cm lat to spine   Hyperlipidemia    Hypertension  MI, old    Nocturia    Phlebitis    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   SCC IS R foreearm distal   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   SCC IS R forearm proximal   Squamous cell carcinoma of skin 03/26/2018   SCC IS R forearm   Squamous cell carcinoma of skin 01/01/2008   SCC IS R forearm   Wears dentures    partial   Wears hearing aid in both ears     Current Outpatient Medications  Medication Sig Dispense Refill   amiodarone (PACERONE) 200 MG tablet 1 tab po twice a day for 14 days then one tab po daily after that 44 tablet 0   apixaban (ELIQUIS) 2.5 MG TABS tablet Take 2.5 mg by mouth 2 (two) times daily.     calcipotriene (DOVONOX) 0.005 % cream Apply topically 2 (two) times daily. (Patient not taking: Reported on 08/16/2023) 60 g 0   Cyanocobalamin (VITAMIN B-12 IJ) Inject 1,000 mcg as directed every 30 (thirty) days.  (Patient not taking: Reported on 08/16/2023)     docusate sodium (COLACE) 100 MG capsule Take 100 mg by mouth in the morning. (Patient not taking: Reported on 08/16/2023)     doxazosin (CARDURA) 2 MG tablet Take 2 mg by mouth daily.     doxepin (SINEQUAN) 25 MG capsule Take 25 mg by mouth. Once daily at bedtime     empagliflozin (JARDIANCE) 10 MG TABS tablet Take by mouth daily. Once daily     ezetimibe (ZETIA) 10 MG tablet Take 10 mg by mouth at bedtime.      fluorouracil (EFUDEX) 5 % cream Apply topically 2 (two) times daily. (Patient not taking: Reported on 08/16/2023) 40 g 0   ketoconazole (NIZORAL) 2 % cream Apply 1 Application topically at bedtime. Qhs to feet (Patient not taking: Reported on 08/16/2023) 60 g 11   metoprolol succinate (TOPROL-XL) 25 MG 24 hr tablet Take 0.5 tablets (12.5 mg total) by mouth at bedtime. (Patient not taking: Reported on 08/16/2023) 15 tablet 0   montelukast (SINGULAIR) 10 MG tablet Take 10 mg by mouth at bedtime.     nitroGLYCERIN (NITROSTAT) 0.4 MG SL tablet Place 0.4 mg under the tongue every 5 (five) minutes x 3 doses as needed for chest pain. (Patient not taking: Reported on 08/16/2023)     polyethylene glycol powder (GLYCOLAX/MIRALAX) 17 GM/SCOOP powder Take 1 Container by mouth once. Once daily     potassium chloride SA (KLOR-CON M) 20 MEQ tablet Take 1 tablet (20 mEq total) by mouth daily. (Patient not taking: Reported on 08/16/2023) 30 tablet 0   probenecid (BENEMID) 500 MG tablet Take 500 mg by mouth daily.     rOPINIRole (REQUIP) 1 MG tablet Take 1 mg by mouth at bedtime.     sacubitril-valsartan (ENTRESTO) 49-51 MG Take 1 tablet by mouth 2 (two) times daily. BID     sennosides-docusate sodium (SENOKOT-S) 8.6-50 MG tablet Take 2 tablets by mouth in the morning and at bedtime. (Patient not taking: Reported on 08/16/2023)     simvastatin (ZOCOR) 20 MG tablet Take 20 mg by mouth daily. Once daily at bedtime     SYRINGE-NEEDLE, DISP, 3 ML 25G X 1" 3 ML MISC Use 1  Syringe monthly. (Patient not taking: Reported on 08/16/2023)     torsemide (DEMADEX) 20 MG tablet Take 1 tablet (20 mg total) by mouth daily. 30 tablet 0   traMADol (ULTRAM) 50 MG tablet Take by mouth  every 6 (six) hours as needed. Bedtime prn     No current facility-administered medications for this visit.      PHYSICAL EXAM:  General:  Well appearing. No resp difficulty HEENT: normal Neck: supple. JVP flat. No lymphadenopathy or thryomegaly appreciated. Cor: PMI normal. Regular rate & rhythm. No rubs, gallops or murmurs. Lungs: clear Abdomen: soft, nontender, nondistended. No hepatosplenomegaly. No bruits or masses.  Extremities: no cyanosis, clubbing, rash, edema Neuro: alert & orientedx3, cranial nerves grossly intact. Moves all 4 extremities w/o difficulty. Affect pleasant.   ECG:   ASSESSMENT & PLAN:  1: NICM with mildly reduced ejection fraction- - suspect due to PAF/ HTN - NYHA class III - euvolemic - not weighing daily; instructed to begin and call for an overnight weight gain of > 2 pounds or a weekly weight gain of > 5 pounds - 202 pounds from last visit here 6 weeks ago - Echo 07/13/23: EF 45-50% along with mild LAE, severe RAE and mild MR.  - not adding salt to his food - continue jardiance 10mg  daily - continue metoprolol 12.5mg  daily (although patient/ wife unsure of this) - continue entresto 49/51mg  BID - continue torsemide 20mg  daily (not currently taking potassium) - consider adding spironolactone although patient/ wife unclear of some of his meds - BNP 07/18/23 was 651.7  2: HTN- - BP  - saw PCP ([facility]) 09/24 - BMP 08/15/23 showed sodium 139, potassium 3.9, creatinine 1.4 & GFR 50 - continue doxazosin 2mg  daily  3: PAF- - saw cardiology  - saw EP Maisie Fus) 08/24 - continue amiodarone 200mg  daily - continue apixaban 2.5mg  BID - wore holter monitor 07/24; no afib noted  4: Lymphedema- - saw vascular Manson Passey) 06/24 - wearing compression socks daily  with removal at bedtime - says that his legs aren't swollen much when he gets up in the mornings  5: CAD- - continue ezetimibe 10mg   - continue simvastatin 20mg  daily - saw cardiology ([facility]) 02/24

## 2023-09-27 ENCOUNTER — Telehealth: Payer: Self-pay | Admitting: Family

## 2023-09-27 ENCOUNTER — Encounter: Payer: TRICARE For Life (TFL) | Admitting: Family

## 2023-09-27 NOTE — Telephone Encounter (Signed)
Patient did not show for his Heart Failure Clinic appointment on 09/27/23.

## 2023-10-11 NOTE — Progress Notes (Unsigned)
MRN : [id number]  Robert Rivas is a 82 y.o. ([birth year]) male who presents with chief complaint of legs swell.  History of Present Illness:   The patient returns to the office for followup evaluation regarding leg swelling.  The swelling has improved quite a bit and the pain associated with swelling has decreased substantially. There have not been any interval development of a ulcerations or wounds.  Since the previous visit the patient has been wearing graduated compression stockings and has noted some improvement in the lymphedema. The patient has been using compression routinely morning until night.  The patient also states elevation during the day and exercise (such as walking) is being done too.    No outpatient medications have been marked as taking for the 10/12/23 encounter (Appointment) with [name], [name], MD.    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   L upper back 5.0cm inf to base of neck, 5.0cm lat to spine   Hyperlipidemia    Hypertension    MI, old    Nocturia    Phlebitis    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   SCC IS R foreearm distal   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   SCC IS R forearm proximal   Squamous cell carcinoma of skin 03/26/2018   SCC IS R forearm   Squamous cell carcinoma of skin 01/01/2008   SCC IS R forearm   Wears dentures    partial   Wears hearing aid in both ears     Past Surgical History:  Procedure Laterality Date   cardaic stents     CARDIAC CATHETERIZATION     PTCA  with Stent Placement   CARDIOVERSION N/A 04/13/2021   Procedure: CARDIOVERSION;  Surgeon: Lamar Blinks, MD;  Location: [facility] OR[facility];  Service: Cardiovascular;  Laterality: N/A;   CARDIOVERSION N/A 05/05/2021   Procedure: CARDIOVERSION;  Surgeon: Lamar Blinks, MD;  Location: [facility];  Service: Cardiovascular;  Laterality: N/A;   CATARACT EXTRACTION W/PHACO Left 01/12/2023   Procedure: CATARACT EXTRACTION PHACO AND INTRAOCULAR LENS PLACEMENT (IOC) LEFT COMPLICATED MALYUGIN OMIDRIA   12.02  01:30.8;  Surgeon: Estanislado Pandy, MD;  Location: [facility];  Service: Ophthalmology;  Laterality: Left;   CATARACT EXTRACTION W/PHACO Right 01/26/2023   Procedure: CATARACT EXTRACTION PHACO AND INTRAOCULAR LENS PLACEMENT (IOC) RIGHT OMIDRIA  COMPLICATED  5.84  00:52.1;  Surgeon: Estanislado Pandy, MD;  Location: [facility];  Service: Ophthalmology;  Laterality: Right;   COLONOSCOPY WITH PROPOFOL N/A 12/15/2015   Procedure: COLONOSCOPY WITH PROPOFOL;  Surgeon: Christena Deem, MD;  Location: [facility];  Service: Endoscopy;  Laterality: N/A;   CORONARY ARTERY BYPASS GRAFT  1992   ECTROPION REPAIR Right 12/12/2017   Procedure: REPAIR OF ECTROPION SUTURES/EXTENSIVE RIGHT;  Surgeon:  Imagene Riches, MD;  Location: [facility];  Service: Ophthalmology;  Laterality: Right;   HERNIA REPAIR     HIP ARTHROPLASTY Left 02/19/2022   Procedure: ARTHROPLASTY BIPOLAR HIP (HEMIARTHROPLASTY);  Surgeon: Juanell Fairly, MD;  Location: [facility];  Service: Orthopedics;  Laterality: Left;   IR KYPHO LUMBAR INC FX REDUCE BONE BX UNI/BIL CANNULATION INC/IMAGING  06/07/2022   IR RADIOLOGIST EVAL & MGMT  06/30/2022   SVT ABLATION  02/22/2017   [facility]    Social History Social History   Tobacco Use   Smoking status: Never   Smokeless tobacco: Never  Vaping Use   Vaping status: Never Used  Substance Use Topics   Alcohol use: No   Drug use: No    Family History Family History   Problem Relation Age of Onset   CAD Father    Kidney cancer Maternal Uncle    Prostate cancer Neg Hx    Bladder Cancer Neg Hx     Allergies  Allergen Reactions   Codeine Nausea And Vomiting and Nausea Only   Tizanidine Other (See Comments)    drowsiness     REVIEW OF SYSTEMS (Negative unless checked)  Constitutional: [] Weight loss  [] Fever  [] Chills Cardiac: [] Chest pain   [] Chest pressure   [] Palpitations   [] Shortness of breath when laying flat   [] Shortness of breath with exertion. Vascular:  [] Pain in legs with walking   [x] Pain in legs with standing  [] History of DVT   [] Phlebitis   [x] Swelling in legs   [] Varicose veins   [] Non-healing ulcers Pulmonary:   [] Uses home oxygen   [] Productive cough   [] Hemoptysis   [] Wheeze  [] COPD   [] Asthma Neurologic:  [] Dizziness   [] Seizures   [] History of stroke   [] History of TIA  [] Aphasia   [] Vissual changes   [] Weakness or numbness in arm   [] Weakness or numbness in leg Musculoskeletal:   [] Joint swelling   [] Joint pain   [] Low back pain Hematologic:  [] Easy bruising  [] Easy bleeding   [] Hypercoagulable state   [] Anemic Gastrointestinal:  [] Diarrhea   [] Vomiting  [] Gastroesophageal reflux/heartburn   [] Difficulty swallowing. Genitourinary:  [] Chronic kidney disease   [] Difficult urination  [] Frequent urination   [] Blood in urine Skin:  [] Rashes   [] Ulcers  Psychological:  [] History of anxiety   []  History of major depression.  Physical Examination  There were no vitals filed for this visit. There is no height or weight on file to calculate BMI. Gen: WD/WN, NAD Head: Wallace/AT, No temporalis wasting.  Ear/Nose/Throat: Hearing grossly intact, nares w/o erythema or drainage, pinna without lesions Eyes: PER, EOMI, sclera nonicteric.  Neck: Supple, no gross masses.  No JVD.  Pulmonary:  Good air movement, no audible wheezing, no use of accessory muscles.  Cardiac: RRR, precordium not hyperdynamic. Vascular:  scattered varicosities present  bilaterally.  Mild venous stasis changes to the legs bilaterally.  3-4+ soft pitting edema, CEAP [id number]  Vessel Right Left  Radial Palpable Palpable  Gastrointestinal: soft, non-distended. No guarding/no peritoneal signs.  Musculoskeletal: M/S 5/5 throughout.  No deformity.  Neurologic: CN 2-12 intact. Pain and light touch intact in extremities.  Symmetrical.  Speech is fluent. Motor exam as listed above. Psychiatric: Judgment intact, Mood & affect appropriate for pt's clinical situation. Dermatologic: Venous rashes no ulcers noted.  No changes consistent with cellulitis. Lymph : No lichenification or skin changes of chronic lymphedema.  CBC Lab Results  Component Value Date   WBC 7.4 07/13/2023   HGB  10.6 (L) 07/13/2023   HCT 32.5 (L) 07/13/2023   MCV 92.3 07/13/2023   PLT 166 07/13/2023    BMET    Component Value Date/Time   NA 140 07/14/2023 0011   NA 132 (L) 04/12/2014 0012   K 3.7 07/14/2023 0011   K 3.9 04/12/2014 0012   CL 104 07/14/2023 0011   CL 97 (L) 04/12/2014 0012   CO2 26 07/14/2023 0011   CO2 29 04/12/2014 0012   GLUCOSE 98 07/14/2023 0011   GLUCOSE 92 04/12/2014 0012   BUN 28 (H) 07/14/2023 0011   BUN 23 (H) 04/12/2014 0012   CREATININE 1.78 (H) 07/14/2023 0011   CREATININE 1.68 (H) 04/12/2014 0012   CALCIUM 8.5 (L) 07/14/2023 0011   CALCIUM 8.7 04/12/2014 0012   GFRNONAA 37 (L) 07/14/2023 0011   GFRNONAA 39 (L) 04/12/2014 0012   GFRAA 50 (L) 10/16/2016 0825   GFRAA 46 (L) 04/12/2014 0012   CrCl cannot be calculated (Patient's most recent lab result is older than the maximum 21 days allowed.).  COAG Lab Results  Component Value Date   INR 1.5 (H) 02/22/2022   INR 1.3 (H) 02/21/2022   INR 1.2 02/20/2022    Radiology No results found.   Assessment/Plan There are no diagnoses linked to this encounter.   Levora Dredge, MD  10/11/2023 9:11 PM

## 2023-10-12 ENCOUNTER — Encounter (INDEPENDENT_AMBULATORY_CARE_PROVIDER_SITE_OTHER): Payer: Self-pay | Admitting: Vascular Surgery

## 2023-10-12 ENCOUNTER — Ambulatory Visit (INDEPENDENT_AMBULATORY_CARE_PROVIDER_SITE_OTHER): Payer: TRICARE For Life (TFL) | Admitting: Vascular Surgery

## 2023-10-12 VITALS — BP 123/82 | HR 97 | Resp 16 | Wt 207.0 lb

## 2023-10-12 DIAGNOSIS — I872 Venous insufficiency (chronic) (peripheral): Secondary | ICD-10-CM | POA: Diagnosis not present

## 2023-10-12 DIAGNOSIS — I890 Lymphedema, not elsewhere classified: Secondary | ICD-10-CM | POA: Diagnosis not present

## 2023-10-12 DIAGNOSIS — I2583 Coronary atherosclerosis due to lipid rich plaque: Secondary | ICD-10-CM

## 2023-10-12 DIAGNOSIS — I2510 Atherosclerotic heart disease of native coronary artery without angina pectoris: Secondary | ICD-10-CM

## 2023-10-12 DIAGNOSIS — I4821 Permanent atrial fibrillation: Secondary | ICD-10-CM

## 2023-10-12 DIAGNOSIS — I10 Essential (primary) hypertension: Secondary | ICD-10-CM

## 2024-05-15 ENCOUNTER — Ambulatory Visit: Payer: TRICARE For Life (TFL) | Admitting: Dermatology
